# Patient Record
Sex: Male | Born: 1985
Health system: Southern US, Community
[De-identification: ages and names within clinical notes are randomized; demographics above are authoritative.]

## PROBLEM LIST (undated history)

## (undated) DIAGNOSIS — E119 Type 2 diabetes mellitus without complications: Secondary | ICD-10-CM

## (undated) DIAGNOSIS — I639 Cerebral infarction, unspecified: Secondary | ICD-10-CM

## (undated) DIAGNOSIS — E109 Type 1 diabetes mellitus without complications: Secondary | ICD-10-CM

## (undated) DIAGNOSIS — I10 Essential (primary) hypertension: Secondary | ICD-10-CM

## (undated) DIAGNOSIS — N186 End stage renal disease: Secondary | ICD-10-CM

## (undated) DIAGNOSIS — I5022 Chronic systolic (congestive) heart failure: Secondary | ICD-10-CM

## (undated) DIAGNOSIS — I69391 Dysphagia following cerebral infarction: Secondary | ICD-10-CM

## (undated) DIAGNOSIS — N183 Chronic kidney disease, stage 3 unspecified: Secondary | ICD-10-CM

## (undated) HISTORY — DX: Cerebral infarction, unspecified: I63.9

## (undated) HISTORY — DX: Chronic systolic (congestive) heart failure: I50.22

## (undated) HISTORY — DX: Type 2 diabetes mellitus without complications: E11.9

## (undated) HISTORY — DX: Dysphagia following cerebral infarction: I69.391

## (undated) HISTORY — PX: TONSILLECTOMY: SUR1361

## (undated) HISTORY — DX: Type 1 diabetes mellitus without complications: E10.9

## (undated) HISTORY — DX: Chronic kidney disease, stage 3 unspecified: N18.30

## (undated) HISTORY — PX: EYE SURGERY: SHX253

## (undated) HISTORY — DX: Essential (primary) hypertension: I10

---

## 2005-09-01 ENCOUNTER — Emergency Department: Payer: Self-pay | Admitting: Emergency Medicine

## 2005-09-10 ENCOUNTER — Ambulatory Visit: Payer: Self-pay | Admitting: Unknown Physician Specialty

## 2005-12-21 ENCOUNTER — Inpatient Hospital Stay: Payer: Self-pay | Admitting: Internal Medicine

## 2007-01-28 ENCOUNTER — Emergency Department: Payer: Self-pay | Admitting: Emergency Medicine

## 2007-05-12 ENCOUNTER — Ambulatory Visit: Payer: Self-pay | Admitting: Internal Medicine

## 2015-09-29 ENCOUNTER — Emergency Department
Admission: EM | Admit: 2015-09-29 | Discharge: 2015-09-29 | Disposition: A | Discharge status: Home / Self Care | Payer: Worker's Compensation | Attending: Emergency Medicine | Admitting: Emergency Medicine

## 2015-09-29 ENCOUNTER — Emergency Department: Payer: Worker's Compensation

## 2015-09-29 ENCOUNTER — Encounter: Payer: Self-pay | Admitting: Emergency Medicine

## 2015-09-29 DIAGNOSIS — W1839XA Other fall on same level, initial encounter: Secondary | ICD-10-CM | POA: Insufficient documentation

## 2015-09-29 DIAGNOSIS — Y929 Unspecified place or not applicable: Secondary | ICD-10-CM | POA: Insufficient documentation

## 2015-09-29 DIAGNOSIS — Y999 Unspecified external cause status: Secondary | ICD-10-CM | POA: Diagnosis not present

## 2015-09-29 DIAGNOSIS — Y9389 Activity, other specified: Secondary | ICD-10-CM | POA: Diagnosis not present

## 2015-09-29 DIAGNOSIS — S0990XA Unspecified injury of head, initial encounter: Secondary | ICD-10-CM | POA: Diagnosis present

## 2015-09-29 DIAGNOSIS — S0083XA Contusion of other part of head, initial encounter: Secondary | ICD-10-CM | POA: Diagnosis not present
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
## 2015-09-29 NOTE — Discharge Instructions (Signed)
Facial or Scalp Contusion ° A facial or scalp contusion is a deep bruise on the face or head. Contusions happen when an injury causes bleeding under the skin. Signs of bruising include pain, puffiness (swelling), and discolored skin. The contusion may turn blue, purple, or yellow. °HOME CARE °· Only take medicines as told by your doctor. °· Put ice on the injured area. °¨ Put ice in a plastic bag. °¨ Place a towel between your skin and the bag. °¨ Leave the ice on for 20 minutes, 2-3 times a day. °GET HELP IF: °· You have bite problems. °· You have pain when chewing. °· You are worried about your face not healing normally. °GET HELP RIGHT AWAY IF:  °· You have severe pain or a headache and medicine does not help. °· You are very tired or confused, or your personality changes. °· You throw up (vomit). °· You have a nosebleed that will not stop. °· You see two of everything (double vision) or have blurry vision. °· You have fluid coming from your nose or ear. °· You have problems walking or using your arms or legs. °MAKE SURE YOU:  °· Understand these instructions. °· Will watch your condition. °· Will get help right away if you are not doing well or get worse. °  °This information is not intended to replace advice given to you by your health care provider. Make sure you discuss any questions you have with your health care provider. °  °Document Released: 04/29/2011 Document Revised: 05/31/2014 Document Reviewed: 12/21/2012 °Elsevier Interactive Patient Education ©2016 Elsevier Inc. ° °

## 2018-04-16 DIAGNOSIS — D509 Iron deficiency anemia, unspecified: Secondary | ICD-10-CM

## 2018-04-16 HISTORY — DX: Iron deficiency anemia, unspecified: D50.9

## 2018-04-16 NOTE — Progress Notes (Signed)
Adrian Evans  [phone] [phone]  ID: Adrian Evans OB: 10-10-1985  MR#: [id number]  LDJ#:[id number]  Patient Care Team: Patient, No Pcp Per as PCP - General (General Practice)  CHIEF COMPLAINT: Iron deficiency anemia  INTERVAL HISTORY: Patient is a 32 year old male is noted to have declining hemoglobin and iron stores on recent laboratory work.  He was also noted to have 3+ hematuria on recent urinalysis.  He currently feels well and is asymptomatic.  He has no neurologic complaints.  He denies any recent fevers or illnesses.  He has a good appetite and denies weight loss.  He has no chest pain or shortness of breath.  He denies any nausea, vomiting, constipation, or diarrhea.  He has no melena or hematochezia.  Despite his documented hematuria, he has no urinary complaints.  Patient feels at his baseline offers no specific complaints today.  REVIEW OF SYSTEMS:   Review of Systems  Constitutional: Negative.  Negative for fever, malaise/fatigue and weight loss.  Respiratory: Negative.  Negative for cough, hemoptysis and shortness of breath.   Cardiovascular: Negative.  Negative for chest pain and leg swelling.  Gastrointestinal: Negative.  Negative for abdominal pain, blood in stool and melena.  Genitourinary: Positive for hematuria.  Musculoskeletal: Negative.  Negative for back pain.  Skin: Negative.  Negative for rash.  Neurological: Negative.  Negative for focal weakness, weakness and headaches.  Psychiatric/Behavioral: Negative.  The patient is not nervous/anxious.     As per HPI. Otherwise, a complete review of systems is negative.  PAST MEDICAL HISTORY: Past Medical History:  Diagnosis Date  . Diabetes mellitus without complication ([place])     PAST SURGICAL HISTORY: Past Surgical History:  Procedure Laterality Date  . EYE SURGERY    . TONSILLECTOMY      FAMILY HISTORY: No family history on file.  ADVANCED DIRECTIVES (Y/N):   N  HEALTH MAINTENANCE: Social History   Tobacco Use  . Smoking status: Never Smoker  Substance Use Topics  . Alcohol use: Not Currently  . Drug use: Not Currently     Colonoscopy:  PAP:  Bone density:  Lipid panel:  No Known Allergies  Current Outpatient Medications  Medication Sig Dispense Refill  . insulin aspart (NOVOLOG) 100 UNIT/ML injection Inject into the skin 3 (three) times daily before meals. Sliding scale     No current facility-administered medications for this visit.     OBJECTIVE: Vitals:   04/17/18 1529  BP: (!) 157/113  Pulse: (!) 108  Resp: 18  Temp: (!) 97.1 F (36.2 C)     There is no height or weight on file to calculate BMI.    ECOG FS:0 - Asymptomatic  General: Well-developed, well-nourished, no acute distress. Eyes: Pink conjunctiva, anicteric sclera. HEENT: Normocephalic, moist mucous membranes, clear oropharnyx. Lungs: Clear to auscultation bilaterally. Heart: Regular rate and rhythm. No rubs, murmurs, or gallops. Abdomen: Soft, nontender, nondistended. No organomegaly noted, normoactive bowel sounds. Musculoskeletal: No edema, cyanosis, or clubbing. Neuro: Alert, answering all questions appropriately. Cranial nerves grossly intact. Skin: No rashes or petechiae noted. Psych: Normal affect. Lymphatics: No cervical, calvicular, axillary or inguinal LAD.   LAB RESULTS:  No results found for: NA, K, CL, CO2, GLUCOSE, BUN, CREATININE, CALCIUM, PROT, ALBUMIN, AST, ALT, ALKPHOS, BILITOT, GFRNONAA, GFRAA  No results found for: WBC, NEUTROABS, HGB, HCT, MCV, PLT   STUDIES: No results found.  ASSESSMENT: Iron deficiency anemia  PLAN:   1. Iron deficiency anemia: Possibly secondary to documented hematuria which  by report is being worked up by urology.  His most recent hemoglobin is 8.7.  Total iron 29, iron saturation 13%.  B12, folate, SPEP were all within normal limits.  Patient denies having colonoscopy, and likely will require one in  the near future.  Will defer to primary care for GI referral.  Return to clinic in 1 to 2 weeks for Feraheme only.  Patient will then return to clinic in 3 months with repeat laboratory work, further evaluation, and additional Feraheme if necessary. 2.  Hematuria: Continue work-up per urology.  I spent a total of 45 minutes face-to-face with the patient of which greater than 50% of the visit was spent in counseling and coordination of care as detailed above.  Patient expressed understanding and was in agreement with this plan. He also understands that He can call clinic at any time with any questions, concerns, or complaints.   Cancer Staging No matching staging information was found for the patient.  [name], MD   04/23/2018 9:44 AM

## 2018-04-17 ENCOUNTER — Inpatient Hospital Stay: Payer: BC Managed Care – PPO | Attending: Oncology | Admitting: Oncology

## 2018-04-17 ENCOUNTER — Encounter: Payer: Self-pay | Admitting: Oncology

## 2018-04-17 ENCOUNTER — Other Ambulatory Visit: Payer: Self-pay

## 2018-04-17 DIAGNOSIS — D509 Iron deficiency anemia, unspecified: Secondary | ICD-10-CM | POA: Diagnosis present

## 2018-04-17 DIAGNOSIS — R319 Hematuria, unspecified: Secondary | ICD-10-CM | POA: Insufficient documentation

## 2018-04-17 NOTE — Progress Notes (Signed)
Patient here today for initial evaluation regarding anemia.  

## 2018-04-27 ENCOUNTER — Inpatient Hospital Stay: Payer: BC Managed Care – PPO | Attending: Oncology

## 2018-04-27 VITALS — BP 135/95 | HR 99 | Temp 96.9°F | Resp 18

## 2018-04-27 DIAGNOSIS — D509 Iron deficiency anemia, unspecified: Secondary | ICD-10-CM | POA: Diagnosis present

## 2018-04-27 MED ORDER — SODIUM CHLORIDE 0.9 % IV SOLN
Freq: Once | INTRAVENOUS | Status: AC
  Administered 2018-04-27: 15:00:00 via INTRAVENOUS
  Filled 2018-04-27: qty 250

## 2018-04-27 MED ORDER — SODIUM CHLORIDE 0.9 % IV SOLN
510.0000 mg | Freq: Once | INTRAVENOUS | Status: AC
  Administered 2018-04-27: 510 mg via INTRAVENOUS
  Filled 2018-04-27: qty 17

## 2018-05-04 ENCOUNTER — Inpatient Hospital Stay: Payer: BC Managed Care – PPO

## 2018-05-04 VITALS — BP 126/86 | HR 108 | Temp 96.0°F | Resp 20

## 2018-05-04 DIAGNOSIS — D509 Iron deficiency anemia, unspecified: Secondary | ICD-10-CM

## 2018-05-04 MED ORDER — SODIUM CHLORIDE 0.9 % IV SOLN
Freq: Once | INTRAVENOUS | Status: AC
  Administered 2018-05-04: 14:00:00 via INTRAVENOUS
  Filled 2018-05-04: qty 250

## 2018-05-04 MED ORDER — SODIUM CHLORIDE 0.9 % IV SOLN
510.0000 mg | Freq: Once | INTRAVENOUS | Status: AC
  Administered 2018-05-04: 510 mg via INTRAVENOUS
  Filled 2018-05-04: qty 17

## 2018-05-10 ENCOUNTER — Telehealth: Payer: Self-pay

## 2018-05-10 ENCOUNTER — Emergency Department: Payer: BC Managed Care – PPO

## 2018-05-10 ENCOUNTER — Other Ambulatory Visit: Payer: Self-pay

## 2018-05-10 ENCOUNTER — Encounter: Payer: Self-pay | Admitting: Emergency Medicine

## 2018-05-10 ENCOUNTER — Emergency Department
Admission: EM | Admit: 2018-05-10 | Discharge: 2018-05-10 | Disposition: A | Discharge status: Home / Self Care | Payer: BC Managed Care – PPO | Attending: Emergency Medicine | Admitting: Emergency Medicine

## 2018-05-10 DIAGNOSIS — Y92481 Parking lot as the place of occurrence of the external cause: Secondary | ICD-10-CM | POA: Insufficient documentation

## 2018-05-10 DIAGNOSIS — Y998 Other external cause status: Secondary | ICD-10-CM | POA: Diagnosis not present

## 2018-05-10 DIAGNOSIS — Z794 Long term (current) use of insulin: Secondary | ICD-10-CM | POA: Insufficient documentation

## 2018-05-10 DIAGNOSIS — S060X1A Concussion with loss of consciousness of 30 minutes or less, initial encounter: Secondary | ICD-10-CM | POA: Diagnosis not present

## 2018-05-10 DIAGNOSIS — E119 Type 2 diabetes mellitus without complications: Secondary | ICD-10-CM | POA: Diagnosis not present

## 2018-05-10 DIAGNOSIS — Y9389 Activity, other specified: Secondary | ICD-10-CM | POA: Diagnosis not present

## 2018-05-10 DIAGNOSIS — S50812A Abrasion of left forearm, initial encounter: Secondary | ICD-10-CM | POA: Diagnosis not present

## 2018-05-10 DIAGNOSIS — W2210XA Striking against or struck by unspecified automobile airbag, initial encounter: Secondary | ICD-10-CM | POA: Diagnosis not present

## 2018-05-10 DIAGNOSIS — T148XXA Other injury of unspecified body region, initial encounter: Secondary | ICD-10-CM

## 2018-05-10 DIAGNOSIS — S0990XA Unspecified injury of head, initial encounter: Secondary | ICD-10-CM | POA: Diagnosis present

## 2018-05-10 DIAGNOSIS — M79632 Pain in left forearm: Secondary | ICD-10-CM
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
## 2018-05-10 NOTE — Discharge Instructions (Addendum)
Please seek medical attention for any high fevers, chest pain, shortness of breath, change in behavior, persistent vomiting, bloody stool or any other new or concerning symptoms.  

## 2018-05-10 NOTE — ED Notes (Signed)
Patient verbalized understanding of discharge instructions, no questions. Patient out of ED via wheelchair in no distress.  

## 2018-05-10 NOTE — Telephone Encounter (Signed)
Dr. Ronnald Collum faxed used a critical lab (Ferritin) to notify Dr. Grayland Ormond. Dr. Grayland Ormond was notified.
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
## 2018-05-11 NOTE — Telephone Encounter (Signed)
Dr. Ronnald Collum sent a critical lab (Ferritin) to Dr. Grayland Ormond. Dr. Grayland Ormond wants to recheck patient's lab to make sure everything is okay. Our scheduler will contact patient with lab appointment information.

## 2018-05-12 NOTE — Telephone Encounter (Signed)
Patient is scheduled to come in on 05/22/2018 for labs.

## 2018-05-22 ENCOUNTER — Other Ambulatory Visit: Payer: Self-pay

## 2018-05-22 ENCOUNTER — Inpatient Hospital Stay
Admission: EM | Admit: 2018-05-22 | Discharge: 2018-05-27 | DRG: 286 | Disposition: A | Discharge status: Home / Self Care | Payer: BC Managed Care – PPO | Attending: Specialist | Admitting: Specialist

## 2018-05-22 ENCOUNTER — Emergency Department: Payer: BC Managed Care – PPO

## 2018-05-22 ENCOUNTER — Encounter: Payer: Self-pay | Admitting: Intensive Care

## 2018-05-22 ENCOUNTER — Inpatient Hospital Stay: Payer: BC Managed Care – PPO

## 2018-05-22 DIAGNOSIS — I509 Heart failure, unspecified: Secondary | ICD-10-CM

## 2018-05-22 DIAGNOSIS — T502X5A Adverse effect of carbonic-anhydrase inhibitors, benzothiadiazides and other diuretics, initial encounter: Secondary | ICD-10-CM | POA: Diagnosis present

## 2018-05-22 DIAGNOSIS — N183 Chronic kidney disease, stage 3 (moderate): Secondary | ICD-10-CM | POA: Diagnosis present

## 2018-05-22 DIAGNOSIS — I5021 Acute systolic (congestive) heart failure: Secondary | ICD-10-CM | POA: Diagnosis present

## 2018-05-22 DIAGNOSIS — I081 Rheumatic disorders of both mitral and tricuspid valves: Secondary | ICD-10-CM | POA: Diagnosis present

## 2018-05-22 DIAGNOSIS — N179 Acute kidney failure, unspecified: Secondary | ICD-10-CM | POA: Diagnosis present

## 2018-05-22 DIAGNOSIS — D509 Iron deficiency anemia, unspecified: Secondary | ICD-10-CM

## 2018-05-22 DIAGNOSIS — E876 Hypokalemia: Secondary | ICD-10-CM | POA: Diagnosis present

## 2018-05-22 DIAGNOSIS — I272 Pulmonary hypertension, unspecified: Secondary | ICD-10-CM | POA: Diagnosis present

## 2018-05-22 DIAGNOSIS — I130 Hypertensive heart and chronic kidney disease with heart failure and stage 1 through stage 4 chronic kidney disease, or unspecified chronic kidney disease: Principal | ICD-10-CM | POA: Diagnosis present

## 2018-05-22 DIAGNOSIS — I420 Dilated cardiomyopathy: Secondary | ICD-10-CM | POA: Diagnosis present

## 2018-05-22 DIAGNOSIS — E1022 Type 1 diabetes mellitus with diabetic chronic kidney disease: Secondary | ICD-10-CM | POA: Diagnosis present

## 2018-05-22 DIAGNOSIS — Z23 Encounter for immunization: Secondary | ICD-10-CM

## 2018-05-22 DIAGNOSIS — R609 Edema, unspecified: Secondary | ICD-10-CM

## 2018-05-22 DIAGNOSIS — E1122 Type 2 diabetes mellitus with diabetic chronic kidney disease: Secondary | ICD-10-CM

## 2018-05-22 HISTORY — DX: Heart failure, unspecified: I50.9

## 2018-05-22 LAB — CBC WITH DIFFERENTIAL/PLATELET
Abs Immature Granulocytes: 0.01 10*3/uL (ref 0.00–0.07)
Abs Immature Granulocytes: 0.02 10*3/uL (ref 0.00–0.07)
BASOS PCT: 1 %
BASOS PCT: 1 %
Basophils Absolute: 0.1 10*3/uL (ref 0.0–0.1)
Basophils Absolute: 0.1 10*3/uL (ref 0.0–0.1)
EOS PCT: 2 %
Eosinophils Absolute: 0.1 10*3/uL (ref 0.0–0.5)
Eosinophils Absolute: 0.1 10*3/uL (ref 0.0–0.5)
Eosinophils Relative: 2 %
HCT: 39.7 % (ref 39.0–52.0)
HCT: 41.3 % (ref 39.0–52.0)
Hemoglobin: 12.6 g/dL — ABNORMAL LOW (ref 13.0–17.0)
Hemoglobin: 13.1 g/dL (ref 13.0–17.0)
Immature Granulocytes: 0 %
Immature Granulocytes: 0 %
Lymphocytes Relative: 22 %
Lymphocytes Relative: 21 %
Lymphs Abs: 1.3 10*3/uL (ref 0.7–4.0)
Lymphs Abs: 1.4 10*3/uL (ref 0.7–4.0)
MCH: 29.2 pg (ref 26.0–34.0)
MCH: 29.4 pg (ref 26.0–34.0)
MCHC: 31.7 g/dL (ref 30.0–36.0)
MCHC: 31.7 g/dL (ref 30.0–36.0)
MCV: 92.1 fL (ref 80.0–100.0)
MCV: 92.6 fL (ref 80.0–100.0)
MONO ABS: 0.4 10*3/uL (ref 0.1–1.0)
Monocytes Absolute: 0.6 10*3/uL (ref 0.1–1.0)
Monocytes Relative: 7 %
Monocytes Relative: 9 %
NRBC: 0 % (ref 0.0–0.2)
Neutro Abs: 4.1 10*3/uL (ref 1.7–7.7)
Neutro Abs: 4.7 10*3/uL (ref 1.7–7.7)
Neutrophils Relative %: 68 %
Neutrophils Relative %: 67 %
PLATELETS: 233 10*3/uL (ref 150–400)
Platelets: 215 10*3/uL (ref 150–400)
RBC: 4.31 MIL/uL (ref 4.22–5.81)
RBC: 4.46 MIL/uL (ref 4.22–5.81)
RDW: 15.3 % (ref 11.5–15.5)
RDW: 15.2 % (ref 11.5–15.5)
WBC: 6 10*3/uL (ref 4.0–10.5)
WBC: 6.9 10*3/uL (ref 4.0–10.5)
nRBC: 0 % (ref 0.0–0.2)

## 2018-05-22 LAB — COMPREHENSIVE METABOLIC PANEL
ALT: 25 U/L (ref 0–44)
AST: 25 U/L (ref 15–41)
Albumin: 3.7 g/dL (ref 3.5–5.0)
Alkaline Phosphatase: 57 U/L (ref 38–126)
Anion gap: 10 (ref 5–15)
BUN: 37 mg/dL — ABNORMAL HIGH (ref 6–20)
CHLORIDE: 109 mmol/L (ref 98–111)
CO2: 23 mmol/L (ref 22–32)
Calcium: 9 mg/dL (ref 8.9–10.3)
Creatinine, Ser: 2.56 mg/dL — ABNORMAL HIGH (ref 0.61–1.24)
GFR calc Af Amer: 36 mL/min — ABNORMAL LOW (ref >60)
GFR calc non Af Amer: 31 mL/min — ABNORMAL LOW (ref >60)
Glucose, Bld: 93 mg/dL (ref 70–99)
Potassium: 3.2 mmol/L — ABNORMAL LOW (ref 3.5–5.1)
Sodium: 142 mmol/L (ref 135–145)
Total Bilirubin: 2.1 mg/dL — ABNORMAL HIGH (ref 0.3–1.2)
Total Protein: 6.8 g/dL (ref 6.5–8.1)

## 2018-05-22 LAB — FERRITIN: Ferritin: 405 ng/mL — ABNORMAL HIGH (ref 24–336)

## 2018-05-22 LAB — BRAIN NATRIURETIC PEPTIDE: B Natriuretic Peptide: 3265 pg/mL — ABNORMAL HIGH (ref 0.0–100.0)

## 2018-05-22 LAB — URINALYSIS, COMPLETE (UACMP) WITH MICROSCOPIC
Bacteria, UA: NONE SEEN
Bilirubin Urine: NEGATIVE
Glucose, UA: NEGATIVE mg/dL
Ketones, ur: NEGATIVE mg/dL
Leukocytes, UA: NEGATIVE
Nitrite: NEGATIVE
PROTEIN: 100 mg/dL — AB
Specific Gravity, Urine: 1.006 (ref 1.005–1.030)
Squamous Epithelial / HPF: NONE SEEN (ref 0–5)
pH: 5 (ref 5.0–8.0)

## 2018-05-22 LAB — IRON AND TIBC
Iron: 53 ug/dL (ref 45–182)
Saturation Ratios: 18 % (ref 17.9–39.5)
TIBC: 289 ug/dL (ref 250–450)
UIBC: 236 ug/dL

## 2018-05-22 LAB — GLUCOSE, CAPILLARY: Glucose-Capillary: 82 mg/dL (ref 70–99)

## 2018-05-22 MED ORDER — FUROSEMIDE 10 MG/ML IJ SOLN
20.0000 mg | Freq: Once | INTRAMUSCULAR | Status: AC
  Administered 2018-05-22: 20 mg via INTRAVENOUS
  Filled 2018-05-22: qty 4

## 2018-05-22 MED ORDER — INSULIN ASPART 100 UNIT/ML ~~LOC~~ SOLN
0.0000 [IU] | Freq: Three times a day (TID) | SUBCUTANEOUS | Status: DC
  Administered 2018-05-23 – 2018-05-26 (×4): 1 [IU] via SUBCUTANEOUS
  Filled 2018-05-22 (×4): qty 1

## 2018-05-22 MED ORDER — POTASSIUM CHLORIDE CRYS ER 20 MEQ PO TBCR
40.0000 meq | EXTENDED_RELEASE_TABLET | Freq: Once | ORAL | Status: AC
  Administered 2018-05-22: 40 meq via ORAL
  Filled 2018-05-22: qty 2

## 2018-05-22 MED ORDER — SODIUM CHLORIDE 0.9 % IV SOLN
Freq: Once | INTRAVENOUS | Status: AC
  Administered 2018-05-22: 18:00:00 via INTRAVENOUS

## 2018-05-22 NOTE — ED Triage Notes (Signed)
Patient c/o swelling from abdomen and down. Was placed on fluid pill X1 week ago but reports no change. Patient is here today due to getting worse and fluid pill not helping. His next appointment is not until next month. Ambulatory into triage with no problems

## 2018-05-22 NOTE — ED Provider Notes (Signed)
[facility] Emergency Department Provider Note  ____________________________________________   I have reviewed the triage vital signs and the nursing notes.   HISTORY  Chief Complaint Leg Swelling   History limited by: Not Limited   HPI Adrian Evans is a 32 y.o. male who presents to the emergency department today because of concerns for bilateral leg swelling.  He states the swelling started a few weeks ago.  Located in both legs.  He has seen his primary care doctor for this and was started on Lasix 20 mg 1 week ago.  He does not feel like this is helped.  In addition he was recently found to be anemic and has been following up with hematology and has received iron transfusions.  The patient is not aware of any history of kidney dysfunction.  Per medical record review patient has a history of DM  Past Medical History:  Diagnosis Date  . Diabetes mellitus without complication [facility])     Patient Active Problem List   Diagnosis Date Noted  . Iron deficiency anemia 04/16/2018    Past Surgical History:  Procedure Laterality Date  . EYE SURGERY    . TONSILLECTOMY      Prior to Admission medications   Medication Sig Start Date End Date Taking? Authorizing Provider  insulin aspart (NOVOLOG) 100 UNIT/ML injection Inject into the skin 3 (three) times daily before meals. Sliding scale    [provider]    Allergies Patient has no known allergies.  History reviewed. No pertinent family history.  Social History Social History   Tobacco Use  . Smoking status: Never Smoker  . Smokeless tobacco: Never Used  Substance Use Topics  . Alcohol use: Not Currently  . Drug use: Not Currently    Review of Systems Constitutional: No fever/chills Eyes: No visual changes. ENT: No sore throat. Cardiovascular: Denies chest pain. Respiratory: Denies shortness of breath. Gastrointestinal: No abdominal pain.  No nausea, no vomiting.  No diarrhea.    Genitourinary: Negative for dysuria. Musculoskeletal: Positive for bilateral leg swelling Skin: Negative for rash. Neurological: Negative for headaches, focal weakness or numbness.  ____________________________________________   PHYSICAL EXAM:  VITAL SIGNS: ED Triage Vitals  Enc Vitals Group     BP 05/22/18 1402 (!) 137/103     Pulse Rate 05/22/18 1402 (!) 104     Resp 05/22/18 1402 18     Temp 05/22/18 1402 97.9 F (36.6 C)     Temp Source 05/22/18 1402 Oral     SpO2 05/22/18 1402 93 %     Weight 05/22/18 1403 280 lb (127 kg)     Height 05/22/18 1403 6' (1.829 m)     Head Circumference --      Peak Flow --      Pain Score 05/22/18 1403 9   Constitutional: Alert and oriented.  Eyes: Conjunctivae are normal.  ENT      Head: Normocephalic and atraumatic.      Nose: No congestion/rhinnorhea.      Mouth/Throat: Mucous membranes are moist.      Neck: No stridor. Hematological/Lymphatic/Immunilogical: No cervical lymphadenopathy. Cardiovascular: Normal rate, regular rhythm.  No murmurs, rubs, or gallops. Respiratory: Normal respiratory effort without tachypnea nor retractions. Breath sounds are clear and equal bilaterally. No wheezes/rales/rhonchi. Gastrointestinal: Soft and non tender. No rebound. No guarding.  Genitourinary: Deferred Musculoskeletal: Normal range of motion in all extremities.  Bilateral 2+ pitting edema in the lower extremities Neurologic:  Normal speech and language. No gross  focal neurologic deficits are appreciated.  Skin:  Skin is warm, dry and intact. No rash noted. Psychiatric: Mood and affect are normal. Speech and behavior are normal. Patient exhibits appropriate insight and judgment.  ____________________________________________    LABS (pertinent positives/negatives)  CBC wbc 6.9, hgb 13.1, plt 233 CMP na 142, k 3.2, cr 2.56, t bili 2.1  ____________________________________________   EKG  I, Nance Pear, attending physician,  personally viewed and interpreted this EKG  EKG Time: 1746 Rate: 97 Rhythm: sinus rhythm Axis: normal Intervals: qtc 439 QRS: narrow, q waves v1, low voltage ST changes: no st elevation Impression: abnormal ekg   ____________________________________________    RADIOLOGY  CXR Concern for chf  ____________________________________________   PROCEDURES  Procedures  ____________________________________________   INITIAL IMPRESSION / ASSESSMENT AND PLAN / ED COURSE  Pertinent labs & imaging results that were available during my care of the patient were reviewed by me and considered in my medical decision making (see chart for details).   Patient presented to the emergency department today because of concerns for worsening swelling in his bilateral lower extremities.  Patient has been on a fluid pill for the past week without any significant relief.  Blood work is concerning for elevated kidney function.  Patient is unaware of any history of kidney disorder.  Discussed these findings with the patient.  Chest x-ray concerning for possible CHF.  Given findings will plan on admission.   ____________________________________________   FINAL CLINICAL IMPRESSION(S) / ED DIAGNOSES  Final diagnoses:  AKI (acute kidney injury) ([place])  Peripheral edema     Note: This dictation was prepared with Dragon dictation. Any transcriptional errors that result from this process are unintentional     Nance Pear, MD 05/22/18 [phone]

## 2018-05-22 NOTE — H&P (Signed)
[place] [facility] [place] NAME: Adrian Evans    MR#:  [id number]  DATE OF BIRTH:  03/10/1986  DATE OF ADMISSION:  05/22/2018  PRIMARY CARE PHYSICIAN: Lenard Simmer, MD   REQUESTING/REFERRING PHYSICIAN: Nance Pear,   CHIEF COMPLAINT:   Chief Complaint  Patient presents with  . Leg Swelling    HISTORY OF PRESENT ILLNESS: Adrian Evans  is a 31 y.o. male with a known history of diabetes type 2 who is presenting to the emergency room with complaint of swelling of the lower extremity.  Patient states that the swelling started 3 weeks ago and has progressively gotten worse.  Patient also states that he is getting short of breath and has gained significant amount of weight.  He also has a dry cough.  In the emergency room patient is noted to have elevated creatinine.  No baseline creatinines available.  Chest x-ray also suggestive of pneumonia.  PAST MEDICAL HISTORY:   Past Medical History:  Diagnosis Date  . Diabetes mellitus without complication ([place])     PAST SURGICAL HISTORY:  Past Surgical History:  Procedure Laterality Date  . EYE SURGERY    . TONSILLECTOMY      SOCIAL HISTORY:  Social History   Tobacco Use  . Smoking status: Never Smoker  . Smokeless tobacco: Never Used  Substance Use Topics  . Alcohol use: Not Currently    FAMILY HISTORY: History reviewed. No pertinent family history.  DRUG ALLERGIES: No Known Allergies  REVIEW OF SYSTEMS:   CONSTITUTIONAL: No fever, fatigue or weakness.  Positive weight gain EYES: No blurred or double vision.  EARS, NOSE, AND THROAT: No tinnitus or ear pain.  RESPIRATORY: Positive cough, positive shortness of breath, wheezing or hemoptysis.  CARDIOVASCULAR: No chest pain, orthopnea, 2+ edema.  GASTROINTESTINAL: No nausea, vomiting, diarrhea or abdominal pain.  GENITOURINARY: No dysuria, hematuria.  ENDOCRINE: No polyuria, nocturia,  HEMATOLOGY: No anemia, easy bruising or  bleeding SKIN: No rash or lesion. MUSCULOSKELETAL: No joint pain or arthritis.   NEUROLOGIC: No tingling, numbness, weakness.  PSYCHIATRY: No anxiety or depression.   MEDICATIONS AT HOME:  Prior to Admission medications   Medication Sig Start Date End Date Taking? Authorizing Provider  insulin aspart (NOVOLOG) 100 UNIT/ML injection Inject into the skin 3 (three) times daily before meals. Sliding scale    [provider]      PHYSICAL EXAMINATION:   VITAL SIGNS: Blood pressure (!) 146/112, pulse (!) 102, temperature 97.9 F (36.6 C), temperature source Oral, resp. rate 18, height 6' (1.829 m), weight 127 kg, SpO2 99 %.  GENERAL:  32 y.o.-year-old patient lying in the bed with no acute distress.  EYES: Pupils equal, round, reactive to light and accommodation. No scleral icterus. Extraocular muscles intact.  HEENT: Head atraumatic, normocephalic. Oropharynx and nasopharynx clear.  NECK:  Supple, no jugular venous distention. No thyroid enlargement, no tenderness.  LUNGS: Occasional crackles at the base CARDIOVASCULAR: S1, S2 normal. No murmurs, rubs, or gallops.  ABDOMEN: Soft, nontender, nondistended. Bowel sounds present. No organomegaly or mass.  EXTREMITIES: 2+ pedal edema, cyanosis, or clubbing.  NEUROLOGIC: Cranial nerves II through XII are intact. Muscle strength 5/5 in all extremities. Sensation intact. Gait not checked.  PSYCHIATRIC: The patient is alert and oriented x 3.  SKIN: No obvious rash, lesion, or ulcer.   LABORATORY PANEL:   CBC Recent Labs  Lab 05/22/18 1147 05/22/18 1409  WBC 6.0 6.9  HGB 12.6* 13.1  HCT 39.7  41.3  PLT 215 233  MCV 92.1 92.6  MCH 29.2 29.4  MCHC 31.7 31.7  RDW 15.3 15.2  LYMPHSABS 1.3 1.4  MONOABS 0.4 0.6  EOSABS 0.1 0.1  BASOSABS 0.1 0.1   ------------------------------------------------------------------------------------------------------------------  Chemistries  Recent Labs  Lab 05/22/18 1409  NA 142  K 3.2*   CL 109  CO2 23  GLUCOSE 93  BUN 37*  CREATININE 2.56*  CALCIUM 9.0  AST 25  ALT 25  ALKPHOS 57  BILITOT 2.1*   ------------------------------------------------------------------------------------------------------------------ estimated creatinine clearance is 56 mL/min (A) (by C-G formula based on SCr of 2.56 mg/dL (H)). ------------------------------------------------------------------------------------------------------------------ No results for input(s): TSH, T4TOTAL, T3FREE, THYROIDAB in the last 72 hours.  Invalid input(s): FREET3   Coagulation profile No results for input(s): INR, PROTIME in the last 168 hours. ------------------------------------------------------------------------------------------------------------------- No results for input(s): DDIMER in the last 72 hours. -------------------------------------------------------------------------------------------------------------------  Cardiac Enzymes No results for input(s): CKMB, TROPONINI, MYOGLOBIN in the last 168 hours.  Invalid input(s): CK ------------------------------------------------------------------------------------------------------------------ Invalid input(s): POCBNP  ---------------------------------------------------------------------------------------------------------------  Urinalysis No results found for: COLORURINE, APPEARANCEUR, LABSPEC, PHURINE, GLUCOSEU, HGBUR, BILIRUBINUR, KETONESUR, PROTEINUR, UROBILINOGEN, NITRITE, LEUKOCYTESUR   RADIOLOGY: Dg Chest 2 View  Result Date: 05/22/2018 CLINICAL DATA:  Cough and peripheral edema for the past 2 weeks. History of diabetes. EXAM: CHEST - 2 VIEW COMPARISON:  None. FINDINGS: The lungs are adequately inflated. The cardiac silhouette is enlarged. The pulmonary vascularity is mildly engorged. The interstitial markings are mildly increased. There is no significant pleural effusion. The observed bony thorax is unremarkable. IMPRESSION: Findings  worrisome for mild CHF which would be unusual in this age group unless there is a history of cardiac disease. The enlargement of the cardiac silhouette may reflect chamber dilation, muscular hypertrophy, or pericardial effusion in the appropriate clinical setting. Electronically Signed   By: David  Martinique M.D.   On: 05/22/2018 14:37    EKG: Orders placed or performed during the hospital encounter of 05/22/18  . ED EKG  . ED EKG    IMPRESSION AND PLAN: Patient is a 32 year old African-American male presenting with shortness of breath and lower extremity swelling  1.  Acute CHF type unknown We will treat with IV Lasix Obtain echocardiogram of the heart Start patient on Coreg Due to acute renal failure will not use ACE inhibitor  2.  Acute renal failure suspect due to fluid overload We will give IV Lasix Nephrology consult Renal ultrasound Obtain a urinalysis  3.  Diabetes type 2 check hemoglobin A1c Placed on sliding scale insulin Patient only uses NovoLog sliding scale at home  All the records are reviewed and case discussed with ED provider. Management plans discussed with the patient, family and they are in agreement.  CODE STATUS: Full code    TOTAL TIME TAKING CARE OF THIS PATIENT: 55 minutes.    Dustin Flock M.D on 05/22/2018 at 6:06 PM  Between 7am to 6pm - Pager - [phone]  After 6pm go to [email or web address] - password [email or web address]  [facility] Office  [phone]  CC: Primary care physician; Lenard Simmer, MD

## 2018-05-23 ENCOUNTER — Inpatient Hospital Stay: Payer: BC Managed Care – PPO

## 2018-05-23 ENCOUNTER — Inpatient Hospital Stay
Admit: 2018-05-23 | Discharge: 2018-05-23 | Disposition: A | Discharge status: Home / Self Care | Payer: BC Managed Care – PPO | Attending: Internal Medicine | Admitting: Internal Medicine

## 2018-05-23 LAB — BASIC METABOLIC PANEL
Anion gap: 11 (ref 5–15)
BUN: 36 mg/dL — ABNORMAL HIGH (ref 6–20)
CO2: 21 mmol/L — ABNORMAL LOW (ref 22–32)
Calcium: 8.8 mg/dL — ABNORMAL LOW (ref 8.9–10.3)
Chloride: 110 mmol/L (ref 98–111)
Creatinine, Ser: 2.43 mg/dL — ABNORMAL HIGH (ref 0.61–1.24)
GFR calc Af Amer: 39 mL/min — ABNORMAL LOW (ref >60)
GFR calc non Af Amer: 33 mL/min — ABNORMAL LOW (ref >60)
Glucose, Bld: 108 mg/dL — ABNORMAL HIGH (ref 70–99)
Potassium: 3.4 mmol/L — ABNORMAL LOW (ref 3.5–5.1)
Sodium: 142 mmol/L (ref 135–145)

## 2018-05-23 LAB — CBC
HCT: 38.8 % — ABNORMAL LOW (ref 39.0–52.0)
Hemoglobin: 12.3 g/dL — ABNORMAL LOW (ref 13.0–17.0)
MCH: 29.6 pg (ref 26.0–34.0)
MCHC: 31.7 g/dL (ref 30.0–36.0)
MCV: 93.3 fL (ref 80.0–100.0)
Platelets: 221 K/uL (ref 150–400)
RBC: 4.16 MIL/uL — ABNORMAL LOW (ref 4.22–5.81)
RDW: 15.2 % (ref 11.5–15.5)
WBC: 8.2 K/uL (ref 4.0–10.5)
nRBC: 0 % (ref 0.0–0.2)

## 2018-05-23 LAB — GLUCOSE, CAPILLARY
GLUCOSE-CAPILLARY: 98 mg/dL (ref 70–99)
Glucose-Capillary: 87 mg/dL (ref 70–99)
Glucose-Capillary: 93 mg/dL (ref 70–99)
Glucose-Capillary: 141 mg/dL — ABNORMAL HIGH (ref 70–99)
Glucose-Capillary: 116 mg/dL — ABNORMAL HIGH (ref 70–99)

## 2018-05-23 LAB — TSH: TSH: 4.061 u[IU]/mL (ref 0.350–4.500)

## 2018-05-23 LAB — PROTEIN / CREATININE RATIO, URINE
Creatinine, Urine: 49 mg/dL
PROTEIN CREATININE RATIO: 0.47 mg/mg{creat} — AB (ref 0.00–0.15)
Total Protein, Urine: 23 mg/dL

## 2018-05-23 LAB — HEMOGLOBIN A1C
Hgb A1c MFr Bld: 5.9 % — ABNORMAL HIGH (ref 4.8–5.6)
Mean Plasma Glucose: 122.63 mg/dL

## 2018-05-23 MED ORDER — LABETALOL HCL 5 MG/ML IV SOLN
10.0000 mg | INTRAVENOUS | Status: DC | PRN
  Administered 2018-05-23: 10 mg via INTRAVENOUS
  Filled 2018-05-23: qty 4

## 2018-05-23 MED ORDER — HEPARIN SODIUM (PORCINE) 5000 UNIT/ML IJ SOLN
5000.0000 [IU] | Freq: Three times a day (TID) | INTRAMUSCULAR | Status: DC
  Administered 2018-05-23 – 2018-05-27 (×13): 5000 [IU] via SUBCUTANEOUS
  Filled 2018-05-23 (×13): qty 1

## 2018-05-23 MED ORDER — SODIUM CHLORIDE 0.9% FLUSH
3.0000 mL | Freq: Two times a day (BID) | INTRAVENOUS | Status: DC
  Administered 2018-05-23 – 2018-05-27 (×7): 3 mL via INTRAVENOUS

## 2018-05-23 MED ORDER — ONDANSETRON HCL 4 MG/2ML IJ SOLN: 4.0000 mg | Freq: Four times a day (QID) | INTRAMUSCULAR | Status: DC | PRN

## 2018-05-23 MED ORDER — SODIUM CHLORIDE 0.9 % IV SOLN: 250.0000 mL | INTRAVENOUS | Status: DC | PRN

## 2018-05-23 MED ORDER — PNEUMOCOCCAL VAC POLYVALENT 25 MCG/0.5ML IJ INJ
0.5000 mL | INJECTION | INTRAMUSCULAR | Status: AC
  Administered 2018-05-24: 0.5 mL via INTRAMUSCULAR
  Filled 2018-05-23: qty 0.5

## 2018-05-23 MED ORDER — FUROSEMIDE 10 MG/ML IJ SOLN
40.0000 mg | Freq: Two times a day (BID) | INTRAMUSCULAR | Status: DC
  Administered 2018-05-23 – 2018-05-24 (×3): 40 mg via INTRAVENOUS
  Filled 2018-05-23 (×3): qty 4

## 2018-05-23 MED ORDER — PREMIER PROTEIN SHAKE
11.0000 [oz_av] | Freq: Two times a day (BID) | ORAL | Status: DC
  Administered 2018-05-23 – 2018-05-27 (×6): 11 [oz_av] via ORAL

## 2018-05-23 MED ORDER — POTASSIUM CHLORIDE CRYS ER 20 MEQ PO TBCR
40.0000 meq | EXTENDED_RELEASE_TABLET | Freq: Once | ORAL | Status: AC
  Administered 2018-05-23: 40 meq via ORAL
  Filled 2018-05-23: qty 2

## 2018-05-23 MED ORDER — ACETAMINOPHEN 325 MG PO TABS: 650.0000 mg | ORAL_TABLET | ORAL | Status: DC | PRN

## 2018-05-23 MED ORDER — CARVEDILOL 6.25 MG PO TABS
6.2500 mg | ORAL_TABLET | Freq: Two times a day (BID) | ORAL | Status: DC
  Administered 2018-05-23 – 2018-05-25 (×5): 6.25 mg via ORAL
  Filled 2018-05-23 (×5): qty 1

## 2018-05-23 MED ORDER — ADULT MULTIVITAMIN W/MINERALS CH
1.0000 | ORAL_TABLET | Freq: Every day | ORAL | Status: DC
  Administered 2018-05-23 – 2018-05-27 (×4): 1 via ORAL
  Filled 2018-05-23 (×4): qty 1

## 2018-05-23 MED ORDER — SODIUM CHLORIDE 0.9% FLUSH: 3.0000 mL | INTRAVENOUS | Status: DC | PRN

## 2018-05-23 NOTE — ED Notes (Signed)
Called floor to give report on pt. RN requests to call this RN back.

## 2018-05-23 NOTE — Plan of Care (Signed)
  Problem: Education: Goal: Ability to verbalize understanding of medication therapies will improve Outcome: Progressing   Problem: Activity: Goal: Capacity to carry out activities will improve Outcome: Progressing   Problem: Cardiac: Goal: Ability to achieve and maintain adequate cardiopulmonary perfusion will improve Outcome: Progressing   Problem: Clinical Measurements: Goal: Will remain free from infection Outcome: Progressing Goal: Diagnostic test results will improve Outcome: Progressing Goal: Respiratory complications will improve Outcome: Progressing   Problem: Elimination: Goal: Will not experience complications related to urinary retention Outcome: Progressing   Problem: Skin Integrity: Goal: Risk for impaired skin integrity will decrease Outcome: Progressing

## 2018-05-23 NOTE — Consult Note (Signed)
[facility]  CONSULT NOTE    Date: 05/23/2018                  Patient Name:  Adrian Evans  MRN: [id number]  DOB: [birth year]  Age / Sex: 31 y.o., male         PCP: Lenard Simmer, MD                 Service Requesting Consult: Dr. Posey Pronto                 Reason for Consult: Acute renal failure            History of Present Illness: Adrian Evans is a 32 y.o. black male with diabetes mellitus type I, diabetic retinopathy, who was admitted to [facility] on 05/22/2018 for Peripheral edema [R60.9] Acute renal failure (ARF) ([place]) [N17.9] AKI (acute kidney injury) ([place]) [N17.9]  Patient's wife is at bedside who assists with history taking. Patient states that he has been having peripheral edema for more than 3 weeks. Denies any recent changes to his medications. No other changes to his health. Patient does endorse occasional use of ibuprofen.   Patient has been diagnosed with diabetes for more than 11 years. Has been on insulin the entire time. States his glucose is well controlled.   Medications: Outpatient medications: Medications Prior to Admission  Medication Sig Dispense Refill Last Dose  . insulin aspart (NOVOLOG) 100 UNIT/ML injection Inject into the skin 3 (three) times daily before meals. Sliding scale   As directed at As directed  . insulin degludec (TRESIBA FLEXTOUCH) 100 UNIT/ML SOPN FlexTouch Pen Inject 10 Units into the skin at bedtime.   05/21/2018 at 2000    Current medications: Current Facility-Administered Medications  Medication Dose Route Frequency Provider Last Rate Last Dose  . 0.9 %  sodium chloride infusion  250 mL Intravenous PRN Dustin Flock, MD      . acetaminophen (TYLENOL) tablet 650 mg  650 mg Oral Q4H PRN Dustin Flock, MD      . carvedilol (COREG) tablet 6.25 mg  6.25 mg Oral BID WC Dustin Flock, MD   6.25 mg at 05/23/18 0853  . furosemide (LASIX) injection 40 mg  40 mg Intravenous BID Dustin Flock, MD   40 mg  at 05/23/18 0852  . heparin injection 5,000 Units  5,000 Units Subcutaneous Q8H Dustin Flock, MD   5,000 Units at 05/23/18 [phone]  . insulin aspart (novoLOG) injection 0-9 Units  0-9 Units Subcutaneous TID WC Dustin Flock, MD      . labetalol (NORMODYNE,TRANDATE) injection 10 mg  10 mg Intravenous Q2H PRN Lance Coon, MD   10 mg at 05/23/18 0140  . ondansetron (ZOFRAN) injection 4 mg  4 mg Intravenous Q6H PRN Dustin Flock, MD      . Derrill Memo ON 05/24/2018] pneumococcal 23 valent vaccine (PNU-IMMUNE) injection 0.5 mL  0.5 mL Intramuscular Tomorrow-1000 Sainani, Vivek J, MD      . sodium chloride flush (NS) 0.9 % injection 3 mL  3 mL Intravenous Q12H Dustin Flock, MD   3 mL at 05/23/18 0853  . sodium chloride flush (NS) 0.9 % injection 3 mL  3 mL Intravenous PRN Dustin Flock, MD          Allergies: No Known Allergies    Past Medical History: Past Medical History:  Diagnosis Date  . Diabetes mellitus without complication [facility])      Past Surgical History: Past Surgical History:  Procedure Laterality Date  . EYE SURGERY    . TONSILLECTOMY       Family History: History reviewed. No pertinent family history.   Social History: Social History   Socioeconomic History  . Marital status: Single    Spouse name: Not on file  . Number of children: Not on file  . Years of education: Not on file  . Highest education level: Not on file  Occupational History  . Not on file  Social Needs  . Financial resource strain: Not on file  . Food insecurity:    Worry: Not on file    Inability: Not on file  . Transportation needs:    Medical: Not on file    Non-medical: Not on file  Tobacco Use  . Smoking status: Never Smoker  . Smokeless tobacco: Never Used  Substance and Sexual Activity  . Alcohol use: Not Currently  . Drug use: Not Currently  . Sexual activity: Not Currently  Lifestyle  . Physical activity:    Days per week: Not on file    Minutes per session: Not on  file  . Stress: Not on file  Relationships  . Social connections:    Talks on phone: Not on file    Gets together: Not on file    Attends religious service: Not on file    Active member of club or organization: Not on file    Attends meetings of clubs or organizations: Not on file    Relationship status: Not on file  . Intimate partner violence:    Fear of current or ex partner: Not on file    Emotionally abused: Not on file    Physically abused: Not on file    Forced sexual activity: Not on file  Other Topics Concern  . Not on file  Social History Narrative  . Not on file     Review of Systems: Review of Systems  Constitutional: Negative.  Negative for chills, diaphoresis, fever, malaise/fatigue and weight loss.  HENT: Negative.  Negative for congestion, ear discharge, ear pain, hearing loss, nosebleeds, sinus pain, sore throat and tinnitus.   Eyes: Negative.  Negative for blurred vision, double vision, photophobia, pain, discharge and redness.  Respiratory: Positive for cough, shortness of breath and wheezing. Negative for hemoptysis, sputum production and stridor.   Cardiovascular: Positive for leg swelling. Negative for chest pain, palpitations, orthopnea, claudication and PND.  Gastrointestinal: Negative for abdominal pain, blood in stool, constipation, diarrhea, heartburn, melena, nausea and vomiting.  Genitourinary: Negative.  Negative for dysuria, flank pain, frequency, hematuria and urgency.  Musculoskeletal: Negative.  Negative for back pain, falls, joint pain, myalgias and neck pain.  Skin: Negative.  Negative for itching and rash.  Neurological: Negative for dizziness, tingling, tremors, sensory change, speech change, focal weakness, seizures, loss of consciousness, weakness and headaches.  Endo/Heme/Allergies: Negative for environmental allergies and polydipsia. Does not bruise/bleed easily.  Psychiatric/Behavioral: Negative.  Negative for depression, hallucinations,  memory loss, substance abuse and suicidal ideas. The patient is not nervous/anxious and does not have insomnia.     Vital Signs: Blood pressure (!) 135/99, pulse 93, temperature 98.1 F (36.7 C), temperature source Oral, resp. rate 18, height 6' (1.829 m), weight 126.6 kg, SpO2 97 %.  Weight trends: Filed Weights   05/22/18 1403 05/23/18 0057 05/23/18 0617  Weight: 127 kg 127.4 kg 126.6 kg    Physical Exam: General: NAD,   Head: Normocephalic, atraumatic. Moist oral mucosal membranes  Eyes: Anicteric, PERRL  Neck:  Supple, trachea midline  Lungs:  Clear to auscultation  Heart: Regular rate and rhythm  Abdomen:  Soft, nontender,   Extremities: + peripheral edema.  Neurologic: Nonfocal, moving all four extremities  Skin: No lesions         Lab results: Basic Metabolic Panel: Recent Labs  Lab 05/22/18 1409 05/23/18 0123  NA 142 142  K 3.2* 3.4*  CL 109 110  CO2 23 21*  GLUCOSE 93 108*  BUN 37* 36*  CREATININE 2.56* 2.43*  CALCIUM 9.0 8.8*    Liver Function Tests: Recent Labs  Lab 05/22/18 1409  AST 25  ALT 25  ALKPHOS 57  BILITOT 2.1*  PROT 6.8  ALBUMIN 3.7   No results for input(s): LIPASE, AMYLASE in the last 168 hours. No results for input(s): AMMONIA in the last 168 hours.  CBC: Recent Labs  Lab 05/22/18 1147 05/22/18 1409 05/23/18 0123  WBC 6.0 6.9 8.2  NEUTROABS 4.1 4.7  --   HGB 12.6* 13.1 12.3*  HCT 39.7 41.3 38.8*  MCV 92.1 92.6 93.3  PLT 215 233 221    Cardiac Enzymes: No results for input(s): CKTOTAL, CKMB, CKMBINDEX, TROPONINI in the last 168 hours.  BNP: Invalid input(s): POCBNP  CBG: Recent Labs  Lab 05/22/18 2302 05/23/18 0053 05/23/18 0817  GLUCAP 82 98 87    Microbiology: No results found for this or any previous visit.  Coagulation Studies: No results for input(s): LABPROT, INR in the last 72 hours.  Urinalysis: Recent Labs    05/22/18 2258  COLORURINE STRAW*  LABSPEC 1.006  PHURINE 5.0  GLUCOSEU  NEGATIVE  HGBUR LARGE*  BILIRUBINUR NEGATIVE  KETONESUR NEGATIVE  PROTEINUR 100*  NITRITE NEGATIVE  LEUKOCYTESUR NEGATIVE      Imaging: Dg Chest 2 View  Result Date: 05/22/2018 CLINICAL DATA:  Cough and peripheral edema for the past 2 weeks. History of diabetes. EXAM: CHEST - 2 VIEW COMPARISON:  None. FINDINGS: The lungs are adequately inflated. The cardiac silhouette is enlarged. The pulmonary vascularity is mildly engorged. The interstitial markings are mildly increased. There is no significant pleural effusion. The observed bony thorax is unremarkable. IMPRESSION: Findings worrisome for mild CHF which would be unusual in this age group unless there is a history of cardiac disease. The enlargement of the cardiac silhouette may reflect chamber dilation, muscular hypertrophy, or pericardial effusion in the appropriate clinical setting. Electronically Signed   By: David  Martinique M.D.   On: 05/22/2018 14:37   US Renal  Result Date: 05/23/2018 CLINICAL DATA:  Acute renal failure EXAM: RENAL / URINARY TRACT ULTRASOUND COMPLETE COMPARISON:  Abdominal ultrasound May 12, 2007 FINDINGS: Right Kidney: Renal measurements: 13.9 x 5.4 x 7.3 cm = volume: 283 mL . Echogenicity and renal cortical thickness are within normal limits. No mass, perinephric fluid, or hydronephrosis visualized. No sonographically demonstrable calculus or ureterectasis. Left Kidney: Renal measurements: 13.3 x 7.5 x 6.7 cm = volume: 346 mL. Echogenicity and renal cortical thickness are within normal limits. No mass, perinephric fluid, or hydronephrosis visualized. No sonographically demonstrable calculus or ureterectasis. Bladder: Appears normal for degree of bladder distention. There is trace ascites. There are small pleural effusions bilaterally. IMPRESSION: Normal appearing kidneys bilaterally. Trace ascites. Small pleural effusions bilaterally. Electronically Signed   By: Lowella Grip III M.D.   On: 05/23/2018 08:07       Assessment & Plan: Adrian Evans is a 32 y.o. black male with diabetes mellitus type I, diabetic retinopathy, who was admitted to [facility] on  05/22/2018 for Peripheral edema [R60.9] Acute renal failure (ARF) ([facility]) [N17.9] AKI (acute kidney injury) ([place]) [N17.9]  1. Acute renal failure with proteinuria and hematuria: no known renal baseline. Creatinine of 2.56 on admission.  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  Ultrasound reviewed with patient - Checking urine spot protein to creatinine ratio - Check SPEP/UPEP, ANA, ANCA, anti-GBM, viral hepatits panel, serum complements.   2. Hypertension: new diagnosis. Elevated diastolic this morning.  Furosemide and carvedilol started on admission.  Echocardiogram pending.   3. Hypokalemia - PO potassium chloride  4. 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   LOS: 1 Matther Labell 12/31/201911:11 AM

## 2018-05-23 NOTE — ED Notes (Signed)
Called floor to give report for second time. This RN will call back in 5 minutes to give report

## 2018-05-23 NOTE — Progress Notes (Signed)
[facility] [place] NAME: Adrian Evans    MR#:  [id number]  DATE OF BIRTH:  [birth year]  SUBJECTIVE:   Patient presents to the hospital secondary to shortness of breath and worsening lower extremity edema and noted to be in CHF.  Patient has no previous history of congestive heart failure.  He was also noted to be in acute kidney injury with no previous creatinine known.  Patient says he feels a little bit better today.  REVIEW OF SYSTEMS:    Review of Systems  Constitutional: Negative for chills and fever.  HENT: Negative for congestion and tinnitus.   Eyes: Negative for blurred vision and double vision.  Respiratory: Positive for shortness of breath. Negative for cough and wheezing.   Cardiovascular: Positive for leg swelling and PND. Negative for chest pain and orthopnea.  Gastrointestinal: Negative for abdominal pain, diarrhea, nausea and vomiting.  Genitourinary: Negative for dysuria and hematuria.  Neurological: Negative for dizziness, sensory change and focal weakness.  All other systems reviewed and are negative.   Nutrition: Carb control/Heart Healthy Tolerating Diet: Yes Tolerating PT: Ambulatory  DRUG ALLERGIES:  No Known Allergies  VITALS:  Blood pressure (!) 135/99, pulse 93, temperature 98.1 F (36.7 C), temperature source Oral, resp. rate 18, height 6' (1.829 m), weight 126.6 kg, SpO2 97 %.  PHYSICAL EXAMINATION:   Physical Exam  GENERAL:  32 y.o.-year-old patient lying in bed in no acute distress.  EYES: Pupils equal, round, reactive to light and accommodation. No scleral icterus. Extraocular muscles intact.  HEENT: Head atraumatic, normocephalic. Oropharynx and nasopharynx clear.  NECK:  Supple, no jugular venous distention. No thyroid enlargement, no tenderness.  LUNGS: Normal breath sounds bilaterally, no wheezing, rales, rhonchi. No use of accessory muscles of respiration.  CARDIOVASCULAR: S1, S2 normal.  No murmurs, rubs, or gallops.  ABDOMEN: Soft, nontender, nondistended. Bowel sounds present. No organomegaly or mass.  EXTREMITIES: No cyanosis, clubbing, +1-2 edema b/l.    NEUROLOGIC: Cranial nerves II through XII are intact. No focal Motor or sensory deficits b/l.   PSYCHIATRIC: The patient is alert and oriented x 3.  SKIN: No obvious rash, lesion, or ulcer.    LABORATORY PANEL:   CBC Recent Labs  Lab 05/23/18 0123  WBC 8.2  HGB 12.3*  HCT 38.8*  PLT 221   ------------------------------------------------------------------------------------------------------------------  Chemistries  Recent Labs  Lab 05/22/18 1409 05/23/18 0123  NA 142 142  K 3.2* 3.4*  CL 109 110  CO2 23 21*  GLUCOSE 93 108*  BUN 37* 36*  CREATININE 2.56* 2.43*  CALCIUM 9.0 8.8*  AST 25  --   ALT 25  --   ALKPHOS 57  --   BILITOT 2.1*  --    ------------------------------------------------------------------------------------------------------------------  Cardiac Enzymes No results for input(s): TROPONINI in the last 168 hours. ------------------------------------------------------------------------------------------------------------------  RADIOLOGY:  Dg Chest 2 View  Result Date: 05/22/2018 CLINICAL DATA:  Cough and peripheral edema for the past 2 weeks. History of diabetes. EXAM: CHEST - 2 VIEW COMPARISON:  None. FINDINGS: The lungs are adequately inflated. The cardiac silhouette is enlarged. The pulmonary vascularity is mildly engorged. The interstitial markings are mildly increased. There is no significant pleural effusion. The observed bony thorax is unremarkable. IMPRESSION: Findings worrisome for mild CHF which would be unusual in this age group unless there is a history of cardiac disease. The enlargement of the cardiac silhouette may reflect chamber dilation, muscular hypertrophy, or pericardial effusion in the appropriate  clinical setting. Electronically Signed   By: David  Martinique M.D.    On: 05/22/2018 14:37   US Renal  Result Date: 05/23/2018 CLINICAL DATA:  Acute renal failure EXAM: RENAL / URINARY TRACT ULTRASOUND COMPLETE COMPARISON:  Abdominal ultrasound May 12, 2007 FINDINGS: Right Kidney: Renal measurements: 13.9 x 5.4 x 7.3 cm = volume: 283 mL . Echogenicity and renal cortical thickness are within normal limits. No mass, perinephric fluid, or hydronephrosis visualized. No sonographically demonstrable calculus or ureterectasis. Left Kidney: Renal measurements: 13.3 x 7.5 x 6.7 cm = volume: 346 mL. Echogenicity and renal cortical thickness are within normal limits. No mass, perinephric fluid, or hydronephrosis visualized. No sonographically demonstrable calculus or ureterectasis. Bladder: Appears normal for degree of bladder distention. There is trace ascites. There are small pleural effusions bilaterally. IMPRESSION: Normal appearing kidneys bilaterally. Trace ascites. Small pleural effusions bilaterally. Electronically Signed   By: Lowella Grip III M.D.   On: 05/23/2018 08:07     ASSESSMENT AND PLAN:   32 year old male with past medical history of diabetes who presents to the hospital due to worsening lower extremity edema and shortness of breath.  1.  CHF-this is the cause of patient's worsening lower extreme edema and shortness of breath.  Unclear if this is systolic or diastolic dysfunction.  Patient has no previous history of congestive heart failure. -Continue diuresis with IV Lasix, follow I's and O's and daily weights. - Continue carvedilol.  May need to consider adding some Imdur/hydralazine given the patient's acute renal failure.  2.  Acute kidney injury with proteinuria-suspected to be secondary to underlying diabetic nephropathy/cardiorenal hemodynamics.  Patient's renal ultrasound was negative for acute obstruction.  Appreciate nephrology input.  Serologic work-up has been initiated. - Follow BUN and creatinine with diuresis, further care as per  nephrology.  3.  Hypokalemia-secondary to diuresis.  We will give some oral potassium.  Repeat level in the morning.  4.  Diabetes type 2 without complication- continue sliding scale insulin, follow blood sugars.  5.  Essential hypertension-patient has no previous history of hypertension- blood pressure is somewhat accelerated. - Continue carvedilol, as needed IV labetalol for now.  May need to add further antihypertensives.     All the records are reviewed and case discussed with Care Management/Social Worker. Management plans discussed with the patient, family and they are in agreement.  CODE STATUS: Full code  DVT Prophylaxis: Hep. SQ  TOTAL TIME TAKING CARE OF THIS PATIENT: 30 minutes.   POSSIBLE D/C IN 2-3 DAYS, DEPENDING ON CLINICAL CONDITION.   Henreitta Leber M.D on 05/23/2018 at 4:23 PM  Between 7am to 6pm - Pager - [phone]  After 6pm go to [email or web address] - [employer] [facility]  Office  [phone]  CC: Primary care physician; Lenard Simmer, MD

## 2018-05-23 NOTE — Progress Notes (Signed)
Initial Nutrition Assessment  DOCUMENTATION CODES:   Not applicable  INTERVENTION:   Premier Protein BID, each supplement provides 160 kcal and 30 grams of protein.   MVI daily   Liberalize diet   NUTRITION DIAGNOSIS:   Inadequate oral intake related to acute illness as evidenced by per patient/family report.  GOAL:   Patient will meet greater than or equal to 90% of their needs  MONITOR:   Labs, PO intake, Supplement acceptance, Vent status, Weight trends, Skin, I & O's  REASON FOR ASSESSMENT:   Consult Diet education  ASSESSMENT:   32 y.o. black male with diabetes mellitus type I, diabetic retinopathy, who was admitted to [facility] on 05/22/2018 for Peripheral edema    Met with pt in room today. Pt reports poor appetite and oral intake for several days pta. Pt reports his appetite remains poor today; pt eating <25% of meals in hospital. Pt reports weight gain secondary to fluid changes but is unsure of his UBW. Pt is willing to drink Premier Protein while in hospital. RD will also liberalize pt's diet as a renal diet is very restrictive and pt with low potassium. Pt provided with low sodium diet education today. Pt reports he feels comfortable with carbohydrate controlled diet.    Medications reviewed and include: lasix, heparin, insulin  Labs reviewed: K 3.4(L), BUN 36(H), creat 2.43(H) BNP- 3265(H)- 12/30 Iron 53, TIBC 289, ferritin 405- 12/30 cbgs- 98, 87, 93 x 24 hrs AIC 5.9- 12/30  NUTRITION - FOCUSED PHYSICAL EXAM:    Most Recent Value  Orbital Region  No depletion  Upper Arm Region  No depletion  Thoracic and Lumbar Region  No depletion  Buccal Region  No depletion  Temple Region  No depletion  Clavicle Bone Region  No depletion  Clavicle and Acromion Bone Region  No depletion  Scapular Bone Region  No depletion  Dorsal Hand  No depletion  Patellar Region  No depletion  Anterior Thigh Region  No depletion  Posterior Calf Region  No depletion  Edema (RD  Assessment)  Moderate  Hair  Reviewed  Eyes  Reviewed  Mouth  Reviewed  Skin  Reviewed  Nails  Reviewed     Diet Order:   Diet Order            Diet Carb Modified Fluid consistency: Thin; Room service appropriate? Yes; Fluid restriction: 1200 mL Fluid  Diet effective now             EDUCATION NEEDS:   Education needs have been addressed  Skin:  Skin Assessment: Reviewed RN Assessment  Last BM:  12/30  Height:   Ht Readings from Last 1 Encounters:  05/23/18 6' (1.829 m)    Weight:   Wt Readings from Last 1 Encounters:  05/23/18 126.6 kg    Ideal Body Weight:  80.9 kg  BMI:  Body mass index is 37.87 kg/m.  Estimated Nutritional Needs:   Kcal:  2300-2600kcal/day   Protein:  100-120g/day   Fluid:  1.2L/day per MD  Koleen Distance MS, RD, LDN Pager #- [phone] Office#- [phone] After Hours Pager: [phone]

## 2018-05-23 NOTE — Progress Notes (Signed)
*  PRELIMINARY RESULTS* Echocardiogram 2D Echocardiogram has been performed.  Adrian Evans Adrian Evans 05/23/2018, 3:03 PM

## 2018-05-23 NOTE — Progress Notes (Signed)
Notified MD of blood pressure. Orders placed. Will continue to monitor and assess.
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
## 2018-05-23 NOTE — Care Management Note (Signed)
Case Management Note  Patient Details  Name: Adrian Evans MRN: [id number] Date of Birth: 25-Jan-1986  Subjective/Objective:         Patient is from home with wife with acute CHF.  Receiving IV lasix.  Current with his PCP.  Denies difficulties obtaining medications or with medical care.  Pending ECHO today.  Does not have a scale at home but can get one without a problem.  Has heart failure clinic appointment.  Gets his medications at [facility] on [address].  Independent in all ADL's.              Action/Plan:   Expected Discharge Date:                  Expected Discharge Plan:  Home/Self Care  In-House Referral:     Discharge planning Services  CM Consult, HF Clinic  Post Acute Care Choice:    Choice offered to:     DME Arranged:    DME Agency:     HH Arranged:    HH Agency:     Status of Service:  Completed, signed off  If discussed at [employer] of Stay Meetings, dates discussed:    Additional Comments:  Elza Rafter, RN 05/23/2018, 12:10 PM

## 2018-05-23 NOTE — Plan of Care (Signed)
Nutrition Education Note  RD consulted for nutrition education regarding new onset CHF.  RD provided "Low Sodium Nutrition Therapy" handout from the Academy of Nutrition and Dietetics. Reviewed patient's dietary recall. Provided examples on ways to decrease sodium intake in diet. Discouraged intake of processed foods and use of salt shaker. Encouraged fresh fruits and vegetables as well as whole grain sources of carbohydrates to maximize fiber intake.   RD discussed why it is important for patient to adhere to diet recommendations, and emphasized the role of fluids, foods to avoid, and importance of weighing self daily. Teach back method used.  Expect good compliance.  Body mass index is 37.87 kg/m. Pt meets criteria for obesity based on current BMI.  Current diet order is CHO/low sodium, patient is consuming approximately 15% of meals at this time.   RD following this pt   Koleen Distance MS, RD, LDN Pager #[phone] Office#- [phone] After Hours Pager: [phone]

## 2018-05-23 NOTE — Plan of Care (Signed)
?  Problem: Education: ?Goal: Ability to demonstrate management of disease process will improve ?Outcome: Progressing ?  ?Problem: Activity: ?Goal: Capacity to carry out activities will improve ?Outcome: Progressing ?  ?Problem: Cardiac: ?Goal: Ability to achieve and maintain adequate cardiopulmonary perfusion will improve ?Outcome: Progressing ?  ?

## 2018-05-24 LAB — GLUCOSE, CAPILLARY
Glucose-Capillary: 103 mg/dL — ABNORMAL HIGH (ref 70–99)
Glucose-Capillary: 117 mg/dL — ABNORMAL HIGH (ref 70–99)
Glucose-Capillary: 140 mg/dL — ABNORMAL HIGH (ref 70–99)
Glucose-Capillary: 136 mg/dL — ABNORMAL HIGH (ref 70–99)

## 2018-05-24 LAB — PARATHYROID HORMONE, INTACT (NO CA): PTH: 32 pg/mL (ref 15–65)

## 2018-05-24 LAB — HEPATITIS C ANTIBODY: HCV Ab: 0.1 s/co ratio (ref 0.0–0.9)

## 2018-05-24 LAB — BASIC METABOLIC PANEL
Anion gap: 11 (ref 5–15)
BUN: 35 mg/dL — ABNORMAL HIGH (ref 6–20)
CO2: 21 mmol/L — ABNORMAL LOW (ref 22–32)
Calcium: 8.9 mg/dL (ref 8.9–10.3)
Chloride: 109 mmol/L (ref 98–111)
Creatinine, Ser: 2.43 mg/dL — ABNORMAL HIGH (ref 0.61–1.24)
GFR calc Af Amer: 39 mL/min — ABNORMAL LOW (ref >60)
GFR calc non Af Amer: 33 mL/min — ABNORMAL LOW (ref >60)
Glucose, Bld: 110 mg/dL — ABNORMAL HIGH (ref 70–99)
POTASSIUM: 3.6 mmol/L (ref 3.5–5.1)
Sodium: 141 mmol/L (ref 135–145)

## 2018-05-24 LAB — ECHOCARDIOGRAM COMPLETE
Height: 72 in
Weight: 4467.2 oz

## 2018-05-24 LAB — HEPATITIS B SURFACE ANTIBODY,QUALITATIVE: Hep B S Ab: REACTIVE

## 2018-05-24 LAB — C3 COMPLEMENT: C3 Complement: 94 mg/dL (ref 82–167)

## 2018-05-24 LAB — HIV ANTIBODY (ROUTINE TESTING W REFLEX): HIV Screen 4th Generation wRfx: NONREACTIVE

## 2018-05-24 LAB — HEPATITIS B SURFACE ANTIGEN: Hepatitis B Surface Ag: NEGATIVE

## 2018-05-24 LAB — HEPATITIS B CORE ANTIBODY, IGM: Hep B C IgM: NEGATIVE

## 2018-05-24 LAB — C4 COMPLEMENT: Complement C4, Body Fluid: 28 mg/dL (ref 14–44)

## 2018-05-24 MED ORDER — SODIUM CHLORIDE 0.9 % WEIGHT BASED INFUSION
3.0000 mL/kg/h | INTRAVENOUS | Status: AC
  Administered 2018-05-25: 3 mL/kg/h via INTRAVENOUS

## 2018-05-24 MED ORDER — SODIUM CHLORIDE 0.9% FLUSH: 3.0000 mL | Freq: Two times a day (BID) | INTRAVENOUS | Status: DC

## 2018-05-24 MED ORDER — SODIUM CHLORIDE 0.9 % IV SOLN: 250.0000 mL | INTRAVENOUS | Status: DC | PRN

## 2018-05-24 MED ORDER — LOSARTAN POTASSIUM 50 MG PO TABS: 100.0000 mg | ORAL_TABLET | Freq: Every day | ORAL | Status: DC

## 2018-05-24 MED ORDER — SODIUM CHLORIDE 0.9 % WEIGHT BASED INFUSION
1.0000 mL/kg/h | INTRAVENOUS | Status: DC
  Administered 2018-05-25: 1 mL/kg/h via INTRAVENOUS

## 2018-05-24 MED ORDER — SODIUM CHLORIDE 0.9% FLUSH: 3.0000 mL | INTRAVENOUS | Status: DC | PRN

## 2018-05-24 MED ORDER — ASPIRIN 81 MG PO CHEW
81.0000 mg | CHEWABLE_TABLET | ORAL | Status: AC
  Administered 2018-05-25: 81 mg via ORAL
  Filled 2018-05-24: qty 1

## 2018-05-24 MED ORDER — SODIUM CHLORIDE 0.9 % IV SOLN
INTRAVENOUS | Status: AC
  Administered 2018-05-24: 20:00:00 via INTRAVENOUS

## 2018-05-24 MED ORDER — HYDRALAZINE HCL 25 MG PO TABS
25.0000 mg | ORAL_TABLET | Freq: Three times a day (TID) | ORAL | Status: DC
  Administered 2018-05-24 – 2018-05-27 (×9): 25 mg via ORAL
  Filled 2018-05-24 (×9): qty 1
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
## 2018-05-24 NOTE — Plan of Care (Signed)

## 2018-05-24 NOTE — Progress Notes (Signed)
[place] [facility] [place] NAME: Adrian Evans    MR#:  [id number]  DATE OF BIRTH:  1986/05/24  SUBJECTIVE:   Patient's echocardiogram showing LV dysfunction with EF of 20 to 25%.  Patient denies any chest pain.  Seen by cardiology and plan for cardiac catheterization tomorrow.  REVIEW OF SYSTEMS:    Review of Systems  Constitutional: Negative for chills and fever.  HENT: Negative for congestion and tinnitus.   Eyes: Negative for blurred vision and double vision.  Respiratory: Positive for shortness of breath. Negative for cough and wheezing.   Cardiovascular: Positive for leg swelling and PND. Negative for chest pain and orthopnea.  Gastrointestinal: Negative for abdominal pain, diarrhea, nausea and vomiting.  Genitourinary: Negative for dysuria and hematuria.  Neurological: Negative for dizziness, sensory change and focal weakness.  All other systems reviewed and are negative.   Nutrition: Carb control/Heart Healthy Tolerating Diet: Yes Tolerating PT: Ambulatory  DRUG ALLERGIES:  No Known Allergies  VITALS:  Blood pressure (!) 138/101, pulse 93, temperature 98 F (36.7 C), resp. rate 16, height 6' (1.829 m), weight 123.5 kg, SpO2 96 %.  PHYSICAL EXAMINATION:   Physical Exam  GENERAL:  33 y.o.-year-old patient lying in bed in no acute distress.  EYES: Pupils equal, round, reactive to light and accommodation. No scleral icterus. Extraocular muscles intact.  HEENT: Head atraumatic, normocephalic. Oropharynx and nasopharynx clear.  NECK:  Supple, no jugular venous distention. No thyroid enlargement, no tenderness.  LUNGS: Normal breath sounds bilaterally, no wheezing, rales, rhonchi. No use of accessory muscles of respiration.  CARDIOVASCULAR: S1, S2 normal. No murmurs, rubs, or gallops.  ABDOMEN: Soft, nontender, nondistended. Bowel sounds present. No organomegaly or mass.  EXTREMITIES: No cyanosis, clubbing, +1-2 edema b/l.      NEUROLOGIC: Cranial nerves II through XII are intact. No focal Motor or sensory deficits b/l.   PSYCHIATRIC: The patient is alert and oriented x 3.  SKIN: No obvious rash, lesion, or ulcer.    LABORATORY PANEL:   CBC Recent Labs  Lab 05/23/18 0123  WBC 8.2  HGB 12.3*  HCT 38.8*  PLT 221   ------------------------------------------------------------------------------------------------------------------  Chemistries  Recent Labs  Lab 05/22/18 1409  05/24/18 0548  NA 142   < > 141  K 3.2*   < > 3.6  CL 109   < > 109  CO2 23   < > 21*  GLUCOSE 93   < > 110*  BUN 37*   < > 35*  CREATININE 2.56*   < > 2.43*  CALCIUM 9.0   < > 8.9  AST 25  --   --   ALT 25  --   --   ALKPHOS 57  --   --   BILITOT 2.1*  --   --    < > = values in this interval not displayed.   ------------------------------------------------------------------------------------------------------------------  Cardiac Enzymes No results for input(s): TROPONINI in the last 168 hours. ------------------------------------------------------------------------------------------------------------------  RADIOLOGY:  Dg Chest 2 View  Result Date: 05/22/2018 CLINICAL DATA:  Cough and peripheral edema for the past 2 weeks. History of diabetes. EXAM: CHEST - 2 VIEW COMPARISON:  None. FINDINGS: The lungs are adequately inflated. The cardiac silhouette is enlarged. The pulmonary vascularity is mildly engorged. The interstitial markings are mildly increased. There is no significant pleural effusion. The observed bony thorax is unremarkable. IMPRESSION: Findings worrisome for mild CHF which would be unusual in this age group unless there  is a history of cardiac disease. The enlargement of the cardiac silhouette may reflect chamber dilation, muscular hypertrophy, or pericardial effusion in the appropriate clinical setting. Electronically Signed   By: David  Martinique M.D.   On: 05/22/2018 14:37   US Renal  Result Date:  05/23/2018 CLINICAL DATA:  Acute renal failure EXAM: RENAL / URINARY TRACT ULTRASOUND COMPLETE COMPARISON:  Abdominal ultrasound May 12, 2007 FINDINGS: Right Kidney: Renal measurements: 13.9 x 5.4 x 7.3 cm = volume: 283 mL . Echogenicity and renal cortical thickness are within normal limits. No mass, perinephric fluid, or hydronephrosis visualized. No sonographically demonstrable calculus or ureterectasis. Left Kidney: Renal measurements: 13.3 x 7.5 x 6.7 cm = volume: 346 mL. Echogenicity and renal cortical thickness are within normal limits. No mass, perinephric fluid, or hydronephrosis visualized. No sonographically demonstrable calculus or ureterectasis. Bladder: Appears normal for degree of bladder distention. There is trace ascites. There are small pleural effusions bilaterally. IMPRESSION: Normal appearing kidneys bilaterally. Trace ascites. Small pleural effusions bilaterally. Electronically Signed   By: Lowella Grip III M.D.   On: 05/23/2018 08:07     ASSESSMENT AND PLAN:   33 year old male with past medical history of diabetes who presents to the hospital due to worsening lower extremity edema and shortness of breath.  1.  CHF-this is the cause of patient's worsening lower extremity edema and shortness of breath.   -Acute systolic dysfunction.  Patient's echocardiogram showing severe LV dysfunction with EF of 20 to 25%. -Continue diuresis with IV Lasix, continue carvedilol, will add some low-dose hydralazine.  May consider starting him on ARB post cardiac catheterization.  2.  Acute kidney injury with proteinuria-suspected to be secondary to underlying diabetic nephropathy/cardiorenal hemodynamics.  Patient's renal ultrasound was negative for acute obstruction.  Appreciate nephrology input.  - Follow BUN and creatinine with diuresis, further care as per nephrology.  3.  Cardiomyopathy-patient noted to have a significant cardiomyopathy EF of 20 to 25%.  Unclear if this is ischemic  or nonischemic.   - plan for Cardiac cath in a.m. tomorrow.   4.  Hypokalemia-secondary to diuresis.  Improving with supplementation and will cont. To monitior.   5.  Diabetes type 2 without complication- continue sliding scale insulin - BS stable.   5.  Essential hypertension-patient has no previous history of hypertension.  BP remains elevated.  - Continue carvedilol, will add some Hydralazine. Cont. PRN Labetalol.      All the records are reviewed and case discussed with Care Management/Social Worker. Management plans discussed with the patient, family and they are in agreement.  CODE STATUS: Full code  DVT Prophylaxis: Hep. SQ  TOTAL TIME TAKING CARE OF THIS PATIENT: 30 minutes.   POSSIBLE D/C IN 2-3 DAYS, DEPENDING ON CLINICAL CONDITION.   Henreitta Leber M.D on 05/24/2018 at 1:55 PM  Between 7am to 6pm - Pager - [phone]  After 6pm go to [email or web address] - [employer] [place] [facility]  Office  [phone]  CC: Primary care physician; Lenard Simmer, MD

## 2018-05-24 NOTE — Progress Notes (Signed)
[facility]  ROUNDING NOTE   Subjective:   Wife at bedside. Patient complains of headache.   UOP 2665mL. Furosemide 40mg  IV q12.   Objective:  Vital signs in last 24 hours:  Temp:  [98 F (36.7 C)-98.4 F (36.9 C)] 98 F (36.7 C) (01/01 0744) Pulse Rate:  [88-94] 93 (01/01 0920) Resp:  [16-20] 16 (12/31 2006) BP: (111-146)/(80-107) 138/101 (01/01 0920) SpO2:  [95 %-97 %] 96 % (01/01 0920) Weight:  [123.5 kg] 123.5 kg (01/01 0547)  Weight change: -3.538 kg Filed Weights   05/23/18 0057 05/23/18 0617 05/24/18 0547  Weight: 127.4 kg 126.6 kg 123.5 kg    Intake/Output: I/O last 3 completed shifts: In: 57 [P.O.:720] Out: 2825 [Urine:2825]   Intake/Output this shift:  Total I/O In: -  Out: 300 [Urine:300]  Physical Exam: General: NAD,   Head: Normocephalic, atraumatic. Moist oral mucosal membranes  Eyes: Anicteric, PERRL  Neck: Supple, trachea midline  Lungs:  Clear to auscultation  Heart: Regular rate and rhythm  Abdomen:  Soft, nontender,   Extremities:  + peripheral edema.  Neurologic: Nonfocal, moving all four extremities  Skin: No lesions        Basic Metabolic Panel: Recent Labs  Lab 05/22/18 1409 05/23/18 0123 05/24/18 0548  NA 142 142 141  K 3.2* 3.4* 3.6  CL 109 110 109  CO2 23 21* 21*  GLUCOSE 93 108* 110*  BUN 37* 36* 35*  CREATININE 2.56* 2.43* 2.43*  CALCIUM 9.0 8.8* 8.9    Liver Function Tests: Recent Labs  Lab 05/22/18 1409  AST 25  ALT 25  ALKPHOS 57  BILITOT 2.1*  PROT 6.8  ALBUMIN 3.7   No results for input(s): LIPASE, AMYLASE in the last 168 hours. No results for input(s): AMMONIA in the last 168 hours.  CBC: Recent Labs  Lab 05/22/18 1147 05/22/18 1409 05/23/18 0123  WBC 6.0 6.9 8.2  NEUTROABS 4.1 4.7  --   HGB 12.6* 13.1 12.3*  HCT 39.7 41.3 38.8*  MCV 92.1 92.6 93.3  PLT 215 233 221    Cardiac Enzymes: No results for input(s): CKTOTAL, CKMB, CKMBINDEX, TROPONINI in the last 168  hours.  BNP: Invalid input(s): POCBNP  CBG: Recent Labs  Lab 05/23/18 1205 05/23/18 1647 05/23/18 2052 05/24/18 0744 05/24/18 1141  GLUCAP 93 141* 116* 103* 117*    Microbiology: No results found for this or any previous visit.  Coagulation Studies: No results for input(s): LABPROT, INR in the last 72 hours.  Urinalysis: Recent Labs    05/22/18 2258  COLORURINE STRAW*  LABSPEC 1.006  PHURINE 5.0  GLUCOSEU NEGATIVE  HGBUR LARGE*  BILIRUBINUR NEGATIVE  KETONESUR NEGATIVE  PROTEINUR 100*  NITRITE NEGATIVE  LEUKOCYTESUR NEGATIVE      Imaging: Dg Chest 2 View  Result Date: 05/22/2018 CLINICAL DATA:  Cough and peripheral edema for the past 2 weeks. History of diabetes. EXAM: CHEST - 2 VIEW COMPARISON:  None. FINDINGS: The lungs are adequately inflated. The cardiac silhouette is enlarged. The pulmonary vascularity is mildly engorged. The interstitial markings are mildly increased. There is no significant pleural effusion. The observed bony thorax is unremarkable. IMPRESSION: Findings worrisome for mild CHF which would be unusual in this age group unless there is a history of cardiac disease. The enlargement of the cardiac silhouette may reflect chamber dilation, muscular hypertrophy, or pericardial effusion in the appropriate clinical setting. Electronically Signed   By: David  Martinique M.D.   On: 05/22/2018 14:37   US Renal  Result Date: 05/23/2018 CLINICAL DATA:  Acute renal failure EXAM: RENAL / URINARY TRACT ULTRASOUND COMPLETE COMPARISON:  Abdominal ultrasound May 12, 2007 FINDINGS: Right Kidney: Renal measurements: 13.9 x 5.4 x 7.3 cm = volume: 283 mL . Echogenicity and renal cortical thickness are within normal limits. No mass, perinephric fluid, or hydronephrosis visualized. No sonographically demonstrable calculus or ureterectasis. Left Kidney: Renal measurements: 13.3 x 7.5 x 6.7 cm = volume: 346 mL. Echogenicity and renal cortical thickness are within normal  limits. No mass, perinephric fluid, or hydronephrosis visualized. No sonographically demonstrable calculus or ureterectasis. Bladder: Appears normal for degree of bladder distention. There is trace ascites. There are small pleural effusions bilaterally. IMPRESSION: Normal appearing kidneys bilaterally. Trace ascites. Small pleural effusions bilaterally. Electronically Signed   By: Lowella Grip III M.D.   On: 05/23/2018 08:07     Medications:   . sodium chloride     . carvedilol  6.25 mg Oral BID WC  . furosemide  40 mg Intravenous BID  . heparin  5,000 Units Subcutaneous Q8H  . insulin aspart  0-9 Units Subcutaneous TID WC  . multivitamin with minerals  1 tablet Oral Daily  . protein supplement shake  11 oz Oral BID BM  . sodium chloride flush  3 mL Intravenous Q12H   sodium chloride, acetaminophen, labetalol, ondansetron (ZOFRAN) IV, sodium chloride flush  Assessment/ Plan:  Mr. JAQUAWN SAFFRAN is a 33 y.o. black male with Mr. KOLTIN WEHMEYER is a 33 y.o. black male with diabetes mellitus type I, diabetic retinopathy, who was admitted to [facility] on 05/22/2018 for edema.   1. Acute renal failure with proteinuria and hematuria: no known renal baseline. Creatinine of 2.56 on admission.  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  Nonnephrotic range proteinuria.  Serum complements within normal limits. Hepatitis C negative.  Hepatitis B prior exposure versus vaccination.  Pending SPEP/UPEP, ANA, ANCA, anti-GBM, viral hepatits panel, serum complements.   2. Hypertension: new diagnosis. Elevated diastolic this morning.  Furosemide and carvedilol started on admission.  Echocardiogram reviewed with patient Appreciate cardiology input. Plan on cardiac catheterization for tomorrow. Will hold furosemide and start IV fluids at 1800.   3. Hypokalemia - PO potassium chloride  4. 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  Insulin Dependent.    LOS: 2 Ysabelle Goodroe 1/1/202012:03 PM

## 2018-05-25 ENCOUNTER — Encounter
Admission: EM | Disposition: A | Discharge status: Home / Self Care | Payer: Self-pay | Source: Home / Self Care | Attending: Specialist

## 2018-05-25 ENCOUNTER — Encounter: Payer: Self-pay | Admitting: *Deleted

## 2018-05-25 HISTORY — PX: RIGHT/LEFT HEART CATH AND CORONARY ANGIOGRAPHY: CATH118266

## 2018-05-25 LAB — PROTEIN ELECTROPHORESIS, SERUM
A/G Ratio: 1.3 (ref 0.7–1.7)
Albumin ELP: 3.1 g/dL (ref 2.9–4.4)
Alpha-1-Globulin: 0.2 g/dL (ref 0.0–0.4)
Alpha-2-Globulin: 0.6 g/dL (ref 0.4–1.0)
Beta Globulin: 0.7 g/dL (ref 0.7–1.3)
GAMMA GLOBULIN: 0.8 g/dL (ref 0.4–1.8)
Globulin, Total: 2.4 g/dL (ref 2.2–3.9)
Total Protein ELP: 5.5 g/dL — ABNORMAL LOW (ref 6.0–8.5)

## 2018-05-25 LAB — GLUCOSE, CAPILLARY
GLUCOSE-CAPILLARY: 131 mg/dL — AB (ref 70–99)
Glucose-Capillary: 106 mg/dL — ABNORMAL HIGH (ref 70–99)
Glucose-Capillary: 102 mg/dL — ABNORMAL HIGH (ref 70–99)
Glucose-Capillary: 104 mg/dL — ABNORMAL HIGH (ref 70–99)
Glucose-Capillary: 136 mg/dL — ABNORMAL HIGH (ref 70–99)

## 2018-05-25 LAB — BASIC METABOLIC PANEL
Anion gap: 7 (ref 5–15)
BUN: 33 mg/dL — ABNORMAL HIGH (ref 6–20)
CO2: 25 mmol/L (ref 22–32)
Calcium: 8.6 mg/dL — ABNORMAL LOW (ref 8.9–10.3)
Chloride: 110 mmol/L (ref 98–111)
Creatinine, Ser: 2.17 mg/dL — ABNORMAL HIGH (ref 0.61–1.24)
GFR calc Af Amer: 44 mL/min — ABNORMAL LOW (ref >60)
GFR calc non Af Amer: 38 mL/min — ABNORMAL LOW (ref >60)
Glucose, Bld: 123 mg/dL — ABNORMAL HIGH (ref 70–99)
Potassium: 3.2 mmol/L — ABNORMAL LOW (ref 3.5–5.1)
Sodium: 142 mmol/L (ref 135–145)

## 2018-05-25 LAB — PROTEIN ELECTRO, RANDOM URINE
Albumin ELP, Urine: 80.8 %
Alpha-1-Globulin, U: 0.3 %
Alpha-2-Globulin, U: 1.7 %
Beta Globulin, U: 8.5 %
Gamma Globulin, U: 8.7 %
Total Protein, Urine: 25.9 mg/dL

## 2018-05-25 LAB — KAPPA/LAMBDA LIGHT CHAINS
Kappa free light chain: 27.3 mg/L — ABNORMAL HIGH (ref 3.3–19.4)
Kappa, lambda light chain ratio: 1.07 (ref 0.26–1.65)
Lambda free light chains: 25.4 mg/L (ref 5.7–26.3)

## 2018-05-25 LAB — ANA W/REFLEX: ANA: NEGATIVE

## 2018-05-25 LAB — PHOSPHORUS: Phosphorus: 4.1 mg/dL (ref 2.5–4.6)

## 2018-05-25 LAB — MPO/PR-3 (ANCA) ANTIBODIES
ANCA Proteinase 3: <3.5 U/mL (ref 0.0–3.5)
Myeloperoxidase Abs: <9 U/mL (ref 0.0–9.0)

## 2018-05-25 LAB — GLOMERULAR BASEMENT MEMBRANE ANTIBODIES: GBM Ab: 2 units (ref 0–20)

## 2018-05-25 SURGERY — RIGHT/LEFT HEART CATH AND CORONARY ANGIOGRAPHY
Anesthesia: Moderate Sedation

## 2018-05-25 MED ORDER — FUROSEMIDE 10 MG/ML IJ SOLN
40.0000 mg | Freq: Two times a day (BID) | INTRAMUSCULAR | Status: DC
  Administered 2018-05-25 – 2018-05-26 (×2): 40 mg via INTRAVENOUS
  Filled 2018-05-25 (×2): qty 4

## 2018-05-25 MED ORDER — MIDAZOLAM HCL 2 MG/2ML IJ SOLN
INTRAMUSCULAR | Status: AC
  Filled 2018-05-25: qty 2

## 2018-05-25 MED ORDER — SODIUM CHLORIDE 0.9 % WEIGHT BASED INFUSION: 1.0000 mL/kg/h | INTRAVENOUS | Status: DC

## 2018-05-25 MED ORDER — IOPAMIDOL (ISOVUE-300) INJECTION 61%
INTRAVENOUS | Status: DC | PRN
  Administered 2018-05-25: 95 mL via INTRA_ARTERIAL

## 2018-05-25 MED ORDER — ACETAMINOPHEN 325 MG PO TABS: 650.0000 mg | ORAL_TABLET | ORAL | Status: DC | PRN

## 2018-05-25 MED ORDER — CARVEDILOL 12.5 MG PO TABS
12.5000 mg | ORAL_TABLET | Freq: Two times a day (BID) | ORAL | Status: DC
  Administered 2018-05-25 – 2018-05-27 (×4): 12.5 mg via ORAL
  Filled 2018-05-25 (×5): qty 1

## 2018-05-25 MED ORDER — ONDANSETRON HCL 4 MG/2ML IJ SOLN: 4.0000 mg | Freq: Four times a day (QID) | INTRAMUSCULAR | Status: DC | PRN

## 2018-05-25 MED ORDER — FENTANYL CITRATE (PF) 100 MCG/2ML IJ SOLN
INTRAMUSCULAR | Status: DC | PRN
  Administered 2018-05-25: 50 ug via INTRAVENOUS

## 2018-05-25 MED ORDER — FENTANYL CITRATE (PF) 100 MCG/2ML IJ SOLN
INTRAMUSCULAR | Status: AC
  Filled 2018-05-25: qty 2

## 2018-05-25 MED ORDER — HEPARIN (PORCINE) IN NACL 1000-0.9 UT/500ML-% IV SOLN
INTRAVENOUS | Status: AC
  Filled 2018-05-25: qty 1000

## 2018-05-25 MED ORDER — SODIUM CHLORIDE 0.9 % WEIGHT BASED INFUSION: 10.0000 mL/h | INTRAVENOUS | Status: AC

## 2018-05-25 MED ORDER — POTASSIUM CHLORIDE CRYS ER 20 MEQ PO TBCR
40.0000 meq | EXTENDED_RELEASE_TABLET | Freq: Once | ORAL | Status: AC
  Administered 2018-05-25: 40 meq via ORAL
  Filled 2018-05-25: qty 2

## 2018-05-25 MED ORDER — MIDAZOLAM HCL 2 MG/2ML IJ SOLN
INTRAMUSCULAR | Status: DC | PRN
  Administered 2018-05-25: 2 mg via INTRAVENOUS

## 2018-05-25 SURGICAL SUPPLY — 14 items
CATH INFINITI 5FR ANG PIGTAIL (CATHETERS) ×1 IMPLANT
CATH INFINITI 5FR JL4 (CATHETERS) ×1 IMPLANT
CATH INFINITI JR4 5F (CATHETERS) ×1 IMPLANT
CATH SWANZ 7F THERMO (CATHETERS) ×1 IMPLANT
DEVICE CLOSURE MYNXGRIP 5F (Vascular Products) ×1 IMPLANT
GUIDEWIRE EMER 3M J .025X150CM (WIRE) ×1 IMPLANT
KIT MANI 3VAL PERCEP (MISCELLANEOUS) ×2 IMPLANT
KIT RIGHT HEART (MISCELLANEOUS) ×2 IMPLANT
NDL PERC 18GX7CM (NEEDLE) IMPLANT
NEEDLE PERC 18GX7CM (NEEDLE) ×2 IMPLANT
PACK CARDIAC CATH (CUSTOM PROCEDURE TRAY) ×2 IMPLANT
SHEATH AVANTI 5FR X 11CM (SHEATH) ×1 IMPLANT
SHEATH AVANTI 7FRX11 (SHEATH) ×1 IMPLANT
WIRE GUIDERIGHT .035X150 (WIRE) ×1 IMPLANT

## 2018-05-25 NOTE — Progress Notes (Signed)
Femoral cath site is clean and dry.  Surrounding skin is soft.  Bilateral pedal pulses are 2+.  No c/o discomfort.  Up to bathroom with assistance and no difficulty.

## 2018-05-25 NOTE — OR Nursing (Signed)
Dr Nehemiah Massed aware of bp meds held pre cath, no new orders received except to leave NS infusing at 10 ml/hr which pt was on when received back from cath lab

## 2018-05-25 NOTE — Plan of Care (Signed)
  Problem: Education: Goal: Ability to demonstrate management of disease process will improve Outcome: Progressing Goal: Ability to verbalize understanding of medication therapies will improve Outcome: Progressing   Problem: Activity: Goal: Capacity to carry out activities will improve Outcome: Progressing   Problem: Clinical Measurements: Goal: Respiratory complications will improve Outcome: Progressing Goal: Cardiovascular complication will be avoided Outcome: Progressing   Problem: Activity: Goal: Risk for activity intolerance will decrease Outcome: Progressing

## 2018-05-25 NOTE — Progress Notes (Signed)
Cardiovascular and Pulmonary Nurse Navigator Note:    33 year old with DM, HTN, HLD, who presented to ED with SOB and lower extremity edema. BNP on admission was 3,265.  ____________________________________________________  CLINICAL DATA:  Cough and peripheral edema for the past 2 weeks. History of diabetes.  EXAM: CHEST - 2 VIEW  COMPARISON:  None.  FINDINGS: The lungs are adequately inflated. The cardiac silhouette is enlarged. The pulmonary vascularity is mildly engorged. The interstitial markings are mildly increased. There is no significant pleural effusion. The observed bony thorax is unremarkable.  IMPRESSION: Findings worrisome for mild CHF which would be unusual in this age group unless there is a history of cardiac disease. The enlargement of the cardiac silhouette may reflect chamber dilation, muscular hypertrophy, or pericardial effusion in the appropriate clinical setting.   __________________________________________________  Result status: Final result  ------------------------------------------------------------------- Transthoracic Echocardiography  Patient:    Adrian Evans, Adrian Evans MR #:       [id number] Study Date: 05/23/2018 Gender:     M Age:        24 Height:     182.9 cm Weight:     126.6 kg BSA:        2.59 m^2 Pt. Status: Room:   ATTENDING    Posey Pronto, Shreyang Lona Kettle, Shreyang H  REFERRING    Posey Pronto, Shreyang H  PERFORMING   [place]  SONOGRAPHER  Charmayne Sheer, RDCS  cc:  ------------------------------------------------------------------- LV EF: 20% -   25%  ------------------------------------------------------------------- Indications:      CHF-Acute Diastolic (616.07).  ------------------------------------------------------------------- History:   PMH:  CKD.  Risk factors:  Diabetes mellitus.  ------------------------------------------------------------------- Study Conclusions  - Left ventricle: The  cavity size was severely dilated. Systolic   function was severely reduced. The estimated ejection fraction   was in the range of 20% to 25%. Diffuse hypokinesis. - Aortic valve: There was trivial regurgitation. - Mitral valve: There was moderate regurgitation. - Left atrium: The atrium was mildly dilated. - Right ventricle: The cavity size was severely dilated. - Right atrium: The atrium was mildly dilated. - Tricuspid valve: There was moderate regurgitation.    Cardiac Cath performed on 05/25/2018:   Panel Physicians Referring Physician Case Authorizing Physician  Corey Skains, MD (Primary)  Corey Skains, MD  Procedures   RIGHT/LEFT HEART CATH AND CORONARY ANGIOGRAPHY  Conclusion     Hemodynamic findings consistent with moderate pulmonary hypertension.  There is mild (2+) tricuspid regurgitation.   Assessment The patient has had Acute systolic dysfunction congestive heart failure with New York Heart Association Class IV symptoms.  reduced left ventricular function with ejection fraction of 20%  Pulmonary capillary wedge pressures with moderate elevation. moderate pulmonary hypertension  normal coronary arteries with no 3 vessel arterial disease and up to 0% stenosis  Plan Aggressive medical management of congestive heart failure with appropriate use of beta blockers, ACE inhibitors, and diuretics. Congestive heart failure education and rehabilitation have been recommended.     CHF Education:?? Educational session with patient / significant other / and family completed. Note:  Patient gave verbal permission for this RN to speak in front of his family in the room.   Reviewed "Living Better with Heart Failure" packet with patient / family. Briefly reviewed definition of heart failure and signs and symptoms of an exacerbation.?Explained to patient that HF is a chronic illness which requires self-assessment / self-management along with help from the  cardiologist/PCP.?? *Reviewed importance of and reason  behind checking weight daily in the AM, after using the bathroom, but before getting dressed. Patient will purchase scale.   ? *Reviewed with patient the following information: *Discussed when to call the Dr= weight gain of >2-3lb overnight of 5lb in a week,  *Discussed yellow zone= call MD: weight gain of >2-3lb overnight of 5lb in a week, increased swelling, increased SOB when lying down, chest discomfort, dizziness, increased fatigue *Red Zone= call 911: struggle to breath, fainting or near fainting, significant chest pain   *Diet - Reviewed low sodium diet-provided handout of recommended and not recommended foods.  Dietitian Consultation for education on low sodium / carb modified / renal diet.   ? *Discussed fluid intake with patient as well. Patient is currently on 1200 ml fluid restriction.  This RN used the water pitcher to demonstrate this amount.  Instructed patient to ask nephrologist / cardiologist what fluid restriction he should follow upon discharge.   ? *Instructed patient to take medications as prescribed for heart failure. Explained briefly why pt is on the medications (either make you feel better, live longer or keep you out of the hospital) and discussed monitoring and side effects.  ? *Discussed exercise. Patient informed this RN that he does not currently exercise or work out.  Explained to patient / family the cardiologist has referred him to outpatient Cardiac Rehab.  Information sheet with CPT billing codes and orientation / class times; brochure, and this RN's business card given to patient.   ? *Smoking Cessation- Patient is a NEVER smoker.? ? *[facility] - Explained the purpose of the HF Clinic. ?Explained to patient the HF Clinic does not replace PCP nor Cardiologist, but is an additional resource to helping patient manage heart failure at home. New patient appointment scheduled for 06/02/2018 at 9:20  a.m.   ? Again, the 5 Steps to Living Better with Heart Failure were reviewed with patient / family.  ? Patient / family thanked me for providing the above information. ? ? Roanna Epley, RN, BSN, [facility]? [facility]  Cardiovascular &?Pulmonary Nurse Navigator  Direct Line: [phone]  Department Phone #: [phone] Fax: [phone]? Email Address: [email or web address]

## 2018-05-25 NOTE — Plan of Care (Signed)
Site is clean and dry. Up to BR.Pedal pulses 2+

## 2018-05-25 NOTE — Progress Notes (Signed)
[facility]  ROUNDING NOTE   Subjective:   Wife at bedside.   UOP 2867mL. Creatinine 2.17 (2.43)  Objective:  Vital signs in last 24 hours:  Temp:  [97.7 F (36.5 C)-98.4 F (36.9 C)] 98 F (36.7 C) (01/02 0828) Pulse Rate:  [84-91] 89 (01/02 0828) Resp:  [14-20] 20 (01/02 0828) BP: (122-145)/(88-106) 145/98 (01/02 0828) SpO2:  [92 %-97 %] 97 % (01/02 0828) Weight:  [120.9 kg] 120.9 kg (01/02 0449)  Weight change: -2.569 kg Filed Weights   05/23/18 0617 05/24/18 0547 05/25/18 0449  Weight: 126.6 kg 123.5 kg 120.9 kg    Intake/Output: I/O last 3 completed shifts: In: 719.3 [I.V.:719.3] Out: 3700 [Urine:3700]   Intake/Output this shift:  No intake/output data recorded.  Physical Exam: General: NAD,   Head: Normocephalic, atraumatic. Moist oral mucosal membranes  Eyes: Anicteric, PERRL  Neck: Supple, trachea midline  Lungs:  Clear to auscultation  Heart: Regular rate and rhythm  Abdomen:  Soft, nontender,   Extremities:  + peripheral edema.  Neurologic: Nonfocal, moving all four extremities  Skin: No lesions        Basic Metabolic Panel: Recent Labs  Lab 05/22/18 1409 05/23/18 0123 05/24/18 0548 05/25/18 0631  NA 142 142 141 142  K 3.2* 3.4* 3.6 3.2*  CL 109 110 109 110  CO2 23 21* 21* 25  GLUCOSE 93 108* 110* 123*  BUN 37* 36* 35* 33*  CREATININE 2.56* 2.43* 2.43* 2.17*  CALCIUM 9.0 8.8* 8.9 8.6*  PHOS  --   --   --  4.1    Liver Function Tests: Recent Labs  Lab 05/22/18 1409  AST 25  ALT 25  ALKPHOS 57  BILITOT 2.1*  PROT 6.8  ALBUMIN 3.7   No results for input(s): LIPASE, AMYLASE in the last 168 hours. No results for input(s): AMMONIA in the last 168 hours.  CBC: Recent Labs  Lab 05/22/18 1147 05/22/18 1409 05/23/18 0123  WBC 6.0 6.9 8.2  NEUTROABS 4.1 4.7  --   HGB 12.6* 13.1 12.3*  HCT 39.7 41.3 38.8*  MCV 92.1 92.6 93.3  PLT 215 233 221    Cardiac Enzymes: No results for input(s): CKTOTAL, CKMB,  CKMBINDEX, TROPONINI in the last 168 hours.  BNP: Invalid input(s): POCBNP  CBG: Recent Labs  Lab 05/24/18 1141 05/24/18 1619 05/24/18 1955 05/25/18 0451 05/25/18 0829  GLUCAP 117* 140* 136* 106* 102*    Microbiology: No results found for this or any previous visit.  Coagulation Studies: No results for input(s): LABPROT, INR in the last 72 hours.  Urinalysis: Recent Labs    05/22/18 2258  COLORURINE STRAW*  LABSPEC 1.006  PHURINE 5.0  GLUCOSEU NEGATIVE  HGBUR LARGE*  BILIRUBINUR NEGATIVE  KETONESUR NEGATIVE  PROTEINUR 100*  NITRITE NEGATIVE  LEUKOCYTESUR NEGATIVE      Imaging: No results found.   Medications:   . sodium chloride    . sodium chloride    . sodium chloride 100 mL/hr at 05/25/18 0309  . sodium chloride 1 mL/kg/hr (05/25/18 0656)   . carvedilol  6.25 mg Oral BID WC  . heparin  5,000 Units Subcutaneous Q8H  . hydrALAZINE  25 mg Oral Q8H  . insulin aspart  0-9 Units Subcutaneous TID WC  . multivitamin with minerals  1 tablet Oral Daily  . protein supplement shake  11 oz Oral BID BM  . sodium chloride flush  3 mL Intravenous Q12H  . sodium chloride flush  3 mL Intravenous Q12H  sodium chloride, sodium chloride, acetaminophen, labetalol, ondansetron (ZOFRAN) IV, sodium chloride flush, sodium chloride flush  Assessment/ Plan:  [name] is a 33 y.o. black male with [name] is a 33 y.o. black male with diabetes mellitus type I, diabetic retinopathy, who was admitted to [facility] on 05/22/2018 for edema.   1. Acute renal failure with proteinuria and hematuria: no known renal baseline. Creatinine of 2.56 on admission.  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  Nonnephrotic range proteinuria.  Pending SPEP/UPEP, ANA, ANCA, anti-GBM, viral hepatits panel.   2. Hypertension: new diagnosis. Elevated diastolic this morning.  Furosemide and  carvedilol started on admission.  Echocardiogram reviewed with patient Appreciate cardiology input. Plan on cardiac catheterization for later today  3. Hypokalemia - PO potassium chloride  4. 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  Insulin Dependent.    LOS: 3 Dayan Kreis 1/2/202011:09 AM

## 2018-05-25 NOTE — Progress Notes (Signed)
Nutrition Brief Note   RD received consult for CHF diet education. Pt previously educated on low sodium education this admit (see note from 12/31). RD following this patient for poor appetite and oral intake.  Koleen Distance MS, RD, LDN Pager #- [phone] Office#- [phone] After Hours Pager: [phone]
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
## 2018-05-25 NOTE — OR Nursing (Signed)
Unable to place 20 gauge iv in right [facility], cath lab staff indicate plan is to do right and left heart cath via right groin. Second IV placed right hand 22 gauge. NSLocked

## 2018-05-25 NOTE — Progress Notes (Signed)
[place] [facility] [place] NAME: Adrian Evans    MR#:  [id number]  DATE OF BIRTH:  Sep 29, 1985  SUBJECTIVE:   Lower extremity edema has improved.  Denies any chest pains.  No shortness of breath.  Status post cardiac catheterization today showing no significant coronary disease but moderate pulmonary hypertension and likely nonischemic cardiomyopathy.  REVIEW OF SYSTEMS:    Review of Systems  Constitutional: Negative for chills and fever.  HENT: Negative for congestion and tinnitus.   Eyes: Negative for blurred vision and double vision.  Respiratory: Positive for shortness of breath. Negative for cough and wheezing.   Cardiovascular: Positive for leg swelling and PND. Negative for chest pain and orthopnea.  Gastrointestinal: Negative for abdominal pain, diarrhea, nausea and vomiting.  Genitourinary: Negative for dysuria and hematuria.  Neurological: Negative for dizziness, sensory change and focal weakness.  All other systems reviewed and are negative.   Nutrition: Carb control/Heart Healthy Tolerating Diet: Yes Tolerating PT: Ambulatory  DRUG ALLERGIES:  No Known Allergies  VITALS:  Blood pressure (!) 147/108, pulse 86, temperature 98 F (36.7 C), temperature source Oral, resp. rate 16, height 6' (1.829 m), weight 120.9 kg, SpO2 95 %.  PHYSICAL EXAMINATION:   Physical Exam  GENERAL:  33 y.o.-year-old patient lying in bed in no acute distress.  EYES: Pupils equal, round, reactive to light and accommodation. No scleral icterus. Extraocular muscles intact.  HEENT: Head atraumatic, normocephalic. Oropharynx and nasopharynx clear.  NECK:  Supple, no jugular venous distention. No thyroid enlargement, no tenderness.  LUNGS: Normal breath sounds bilaterally, no wheezing, rales, rhonchi. No use of accessory muscles of respiration.  CARDIOVASCULAR: S1, S2 normal. No murmurs, rubs, or gallops.  ABDOMEN: Soft, nontender, nondistended. Bowel  sounds present. No organomegaly or mass.  EXTREMITIES: No cyanosis, clubbing, +1-2 edema b/l.    NEUROLOGIC: Cranial nerves II through XII are intact. No focal Motor or sensory deficits b/l.   PSYCHIATRIC: The patient is alert and oriented x 3.  SKIN: No obvious rash, lesion, or ulcer.    LABORATORY PANEL:   CBC Recent Labs  Lab 05/23/18 0123  WBC 8.2  HGB 12.3*  HCT 38.8*  PLT 221   ------------------------------------------------------------------------------------------------------------------  Chemistries  Recent Labs  Lab 05/22/18 1409  05/25/18 0631  NA 142   < > 142  K 3.2*   < > 3.2*  CL 109   < > 110  CO2 23   < > 25  GLUCOSE 93   < > 123*  BUN 37*   < > 33*  CREATININE 2.56*   < > 2.17*  CALCIUM 9.0   < > 8.6*  AST 25  --   --   ALT 25  --   --   ALKPHOS 57  --   --   BILITOT 2.1*  --   --    < > = values in this interval not displayed.   ------------------------------------------------------------------------------------------------------------------  Cardiac Enzymes No results for input(s): TROPONINI in the last 168 hours. ------------------------------------------------------------------------------------------------------------------  RADIOLOGY:  No results found.   ASSESSMENT AND PLAN:   33 year old male with past medical history of diabetes who presents to the hospital due to worsening lower extremity edema and shortness of breath.  1.  CHF-this is the cause of patient's worsening lower extremity edema and shortness of breath.   -Acute systolic dysfunction.  Patient's echocardiogram showing severe LV dysfunction with EF of 20 to 25%. -Continue diuresis with IV Lasix,  continue carvedilol, low dose hydralazine.  -Patient is 5.5 L negative since admission.  Improving.  2.  Acute kidney injury with proteinuria-suspected to be secondary to underlying diabetic nephropathy/cardiorenal hemodynamics.  Patient's renal ultrasound was negative for acute  obstruction.  Appreciate nephrology input. Cr. Stable with diuresis and will cont. To monitor.   3.  Cardiomyopathy-patient noted to have a significant cardiomyopathy EF of 20 to 25%.  -Status post cardiac catheterization today showing no significant coronary artery disease.  It shows moderate pulmonary hypertension.  This is likely nonischemic cardiomyopathy.  Continue optimization of heart failure for now.  Appreciate cardiology input.  4.  Hypokalemia-secondary to diuresis.  Improving with supplementation and will cont. To monitior.   5.  Diabetes type 2 without complication- continue sliding scale insulin - BS stable.   5.  Essential hypertension- cont. Coreg, Hydralazine.    All the records are reviewed and case discussed with Care Management/Social Worker. Management plans discussed with the patient, family and they are in agreement.  CODE STATUS: Full code  DVT Prophylaxis: Hep. SQ  TOTAL TIME TAKING CARE OF THIS PATIENT: 30 minutes.   POSSIBLE D/C IN 2-3 DAYS, DEPENDING ON CLINICAL CONDITION.   Henreitta Leber M.D on 05/25/2018 at 3:17 PM  Between 7am to 6pm - Pager - [phone]  After 6pm go to [email or web address] - Technical [employer]wer [facility]  Office  [phone]  CC: Primary care physician; Lenard Simmer, MD

## 2018-05-26 LAB — GLUCOSE, CAPILLARY
GLUCOSE-CAPILLARY: 105 mg/dL — AB (ref 70–99)
GLUCOSE-CAPILLARY: 131 mg/dL — AB (ref 70–99)
Glucose-Capillary: 138 mg/dL — ABNORMAL HIGH (ref 70–99)
Glucose-Capillary: 112 mg/dL — ABNORMAL HIGH (ref 70–99)

## 2018-05-26 LAB — RENAL FUNCTION PANEL
Albumin: 3.2 g/dL — ABNORMAL LOW (ref 3.5–5.0)
Anion gap: 10 (ref 5–15)
BUN: 29 mg/dL — ABNORMAL HIGH (ref 6–20)
CHLORIDE: 108 mmol/L (ref 98–111)
CO2: 24 mmol/L (ref 22–32)
Calcium: 8.7 mg/dL — ABNORMAL LOW (ref 8.9–10.3)
Creatinine, Ser: 1.99 mg/dL — ABNORMAL HIGH (ref 0.61–1.24)
GFR calc Af Amer: 49 mL/min — ABNORMAL LOW (ref >60)
GFR calc non Af Amer: 43 mL/min — ABNORMAL LOW (ref >60)
Glucose, Bld: 116 mg/dL — ABNORMAL HIGH (ref 70–99)
Phosphorus: 4.1 mg/dL (ref 2.5–4.6)
Potassium: 3.2 mmol/L — ABNORMAL LOW (ref 3.5–5.1)
Sodium: 142 mmol/L (ref 135–145)

## 2018-05-26 MED ORDER — POTASSIUM CHLORIDE CRYS ER 20 MEQ PO TBCR
40.0000 meq | EXTENDED_RELEASE_TABLET | Freq: Once | ORAL | Status: AC
  Administered 2018-05-26: 40 meq via ORAL
  Filled 2018-05-26: qty 2

## 2018-05-26 MED ORDER — LOSARTAN POTASSIUM 25 MG PO TABS
25.0000 mg | ORAL_TABLET | Freq: Every day | ORAL | Status: DC
  Administered 2018-05-26 – 2018-05-27 (×2): 25 mg via ORAL
  Filled 2018-05-26 (×2): qty 1

## 2018-05-26 MED ORDER — FUROSEMIDE 10 MG/ML IJ SOLN
60.0000 mg | Freq: Two times a day (BID) | INTRAMUSCULAR | Status: DC
  Administered 2018-05-26 – 2018-05-27 (×2): 60 mg via INTRAVENOUS
  Filled 2018-05-26 (×2): qty 6

## 2018-05-26 NOTE — Progress Notes (Addendum)
[place] [facility] [place] NAME: Adrian Evans    MR#:  [id number]  DATE OF BIRTH:  [birth year]  SUBJECTIVE:   Continues to have significant lower extremity edema.  Denies any chest pains, worsening shortness of breath.  Renal function improved since yesterday.  Cardiac catheterization yesterday showing no coronary disease and likely nonischemic cardiomyopathy. Wife at bedside.   REVIEW OF SYSTEMS:    Review of Systems  Constitutional: Negative for chills and fever.  HENT: Negative for congestion and tinnitus.   Eyes: Negative for blurred vision and double vision.  Respiratory: Negative for cough, shortness of breath and wheezing.   Cardiovascular: Positive for leg swelling and PND. Negative for chest pain and orthopnea.  Gastrointestinal: Negative for abdominal pain, diarrhea, nausea and vomiting.  Genitourinary: Negative for dysuria and hematuria.  Neurological: Negative for dizziness, sensory change and focal weakness.  All other systems reviewed and are negative.   Nutrition: Carb control/Heart Healthy Tolerating Diet: Yes Tolerating PT: Ambulatory  DRUG ALLERGIES:  No Known Allergies  VITALS:  Blood pressure (!) 143/99, pulse 89, temperature 98.4 F (36.9 C), temperature source Oral, resp. rate 19, height 6' (1.829 m), weight 121.9 kg, SpO2 98 %.  PHYSICAL EXAMINATION:   Physical Exam  GENERAL:  33 y.o.-year-old patient lying in bed in no acute distress.  EYES: Pupils equal, round, reactive to light and accommodation. No scleral icterus. Extraocular muscles intact.  HEENT: Head atraumatic, normocephalic. Oropharynx and nasopharynx clear.  NECK:  Supple, no jugular venous distention. No thyroid enlargement, no tenderness.  LUNGS: Normal breath sounds bilaterally, no wheezing, rales, rhonchi. No use of accessory muscles of respiration.  CARDIOVASCULAR: S1, S2 normal. No murmurs, rubs, + s3 gallop.  ABDOMEN: Soft, nontender,  nondistended. Bowel sounds present. No organomegaly or mass.  EXTREMITIES: No cyanosis, clubbing, +1-2 edema b/l.    NEUROLOGIC: Cranial nerves II through XII are intact. No focal Motor or sensory deficits b/l.   PSYCHIATRIC: The patient is alert and oriented x 3.  SKIN: No obvious rash, lesion, or ulcer.    LABORATORY PANEL:   CBC Recent Labs  Lab 05/23/18 0123  WBC 8.2  HGB 12.3*  HCT 38.8*  PLT 221   ------------------------------------------------------------------------------------------------------------------  Chemistries  Recent Labs  Lab 05/22/18 1409  05/26/18 0834  NA 142   < > 142  K 3.2*   < > 3.2*  CL 109   < > 108  CO2 23   < > 24  GLUCOSE 93   < > 116*  BUN 37*   < > 29*  CREATININE 2.56*   < > 1.99*  CALCIUM 9.0   < > 8.7*  AST 25  --   --   ALT 25  --   --   ALKPHOS 57  --   --   BILITOT 2.1*  --   --    < > = values in this interval not displayed.   ------------------------------------------------------------------------------------------------------------------  Cardiac Enzymes No results for input(s): TROPONINI in the last 168 hours. ------------------------------------------------------------------------------------------------------------------  RADIOLOGY:  No results found.   ASSESSMENT AND PLAN:   33 year old male with past medical history of diabetes who presents to the hospital due to worsening lower extremity edema and shortness of breath.  1.  CHF-this is the cause of patient's worsening lower extremity edema and shortness of breath.   -Acute systolic dysfunction.  Patient's echocardiogram showing severe LV dysfunction with EF of 20 to 25%. -Continue diuresis  with IV Lasix and will increase dose, continue carvedilol, low dose hydralazine and losartan added by Nerphrology.  -Patient is 7 L negative since admission and improving and will cont. Further diuresis for now.   2.  Acute kidney injury with proteinuria-suspected to be  secondary to underlying diabetic nephropathy/cardiorenal hemodynamics.  Patient's renal ultrasound was negative for acute obstruction.  Appreciate nephrology input.  - Cr. Improving with diuresis and will cont. To monitor.   3.  Cardiomyopathy-patient noted to have a significant cardiomyopathy EF of 20 to 25%.  -Status post cardiac catheterization yesteday showing no significant coronary artery disease.  It shows moderate pulmonary hypertension.  This is likely nonischemic cardiomyopathy.  - Continue optimization of heart failure for now.  Appreciate cardiology input.  4.  Hypokalemia-secondary to diuresis.  Improving with supplementation and will cont. To monitior.   5.  Diabetes type 2 without complication- continue sliding scale insulin - BS stable.   6.  Essential hypertension- cont. Coreg, Hydralazine and low dose Losartan added today.    All the records are reviewed and case discussed with Care Management/Social Worker. Management plans discussed with the patient, family and they are in agreement.  CODE STATUS: Full code  DVT Prophylaxis: Hep. SQ  TOTAL TIME TAKING CARE OF THIS PATIENT: 30 minutes.   POSSIBLE D/C IN 2-3 DAYS, DEPENDING ON CLINICAL CONDITION.   Henreitta Leber M.D on 05/26/2018 at 4:16 PM  Between 7am to 6pm - Pager - [phone]  After 6pm go to [email or web address] - [employer] [facility]  Office  [phone]  CC: Primary care physician; Lenard Simmer, MD

## 2018-05-26 NOTE — Plan of Care (Signed)
  Problem: Activity: Goal: Risk for activity intolerance will decrease Outcome: Progressing   Problem: Pain Managment: Goal: General experience of comfort will improve Outcome: Progressing   Problem: Safety: Goal: Ability to remain free from injury will improve Outcome: Progressing   Problem: Cardiovascular: Goal: Vascular access site(s) Level 0-1 will be maintained Outcome: Progressing

## 2018-05-26 NOTE — Progress Notes (Signed)
[facility]  ROUNDING NOTE   Subjective:   Wife at bedside.   Furosemide IV 40mg  daily  Objective:  Vital signs in last 24 hours:  Temp:  [98.1 F (36.7 C)-98.4 F (36.9 C)] 98.4 F (36.9 C) (01/03 0732) Pulse Rate:  [86-95] 89 (01/03 0732) Resp:  [11-20] 19 (01/03 0732) BP: (124-159)/(97-119) 143/99 (01/03 0732) SpO2:  [90 %-100 %] 98 % (01/03 0732) Weight:  [121.9 kg] 121.9 kg (01/03 0442)  Weight change: 1.027 kg Filed Weights   05/24/18 0547 05/25/18 0449 05/26/18 0442  Weight: 123.5 kg 120.9 kg 121.9 kg    Intake/Output: I/O last 3 completed shifts: In: 1840.7 [P.O.:325; I.V.:1515.7] Out: 3975 [Urine:3975]   Intake/Output this shift:  Total I/O In: -  Out: 950 [Urine:950]  Physical Exam: General: NAD,   Head: Normocephalic, atraumatic. Moist oral mucosal membranes  Eyes: Anicteric, PERRL  Neck: Supple, trachea midline  Lungs:  Clear to auscultation  Heart: Regular rate and rhythm  Abdomen:  Soft, nontender,   Extremities:  + peripheral edema.  Neurologic: Nonfocal, moving all four extremities  Skin: No lesions        Basic Metabolic Panel: Recent Labs  Lab 05/22/18 1409 05/23/18 0123 05/24/18 0548 05/25/18 0631 05/26/18 0834  NA 142 142 141 142 142  K 3.2* 3.4* 3.6 3.2* 3.2*  CL 109 110 109 110 108  CO2 23 21* 21* 25 24  GLUCOSE 93 108* 110* 123* 116*  BUN 37* 36* 35* 33* 29*  CREATININE 2.56* 2.43* 2.43* 2.17* 1.99*  CALCIUM 9.0 8.8* 8.9 8.6* 8.7*  PHOS  --   --   --  4.1 4.1    Liver Function Tests: Recent Labs  Lab 05/22/18 1409 05/26/18 0834  AST 25  --   ALT 25  --   ALKPHOS 57  --   BILITOT 2.1*  --   PROT 6.8  --   ALBUMIN 3.7 3.2*   No results for input(s): LIPASE, AMYLASE in the last 168 hours. No results for input(s): AMMONIA in the last 168 hours.  CBC: Recent Labs  Lab 05/22/18 1147 05/22/18 1409 05/23/18 0123  WBC 6.0 6.9 8.2  NEUTROABS 4.1 4.7  --   HGB 12.6* 13.1 12.3*  HCT 39.7 41.3 38.8*   MCV 92.1 92.6 93.3  PLT 215 233 221    Cardiac Enzymes: No results for input(s): CKTOTAL, CKMB, CKMBINDEX, TROPONINI in the last 168 hours.  BNP: Invalid input(s): POCBNP  CBG: Recent Labs  Lab 05/25/18 1315 05/25/18 1633 05/25/18 2119 05/26/18 0734 05/26/18 1129  GLUCAP 104* 136* 131* 105* 138*    Microbiology: No results found for this or any previous visit.  Coagulation Studies: No results for input(s): LABPROT, INR in the last 72 hours.  Urinalysis: No results for input(s): COLORURINE, LABSPEC, PHURINE, GLUCOSEU, HGBUR, BILIRUBINUR, KETONESUR, PROTEINUR, UROBILINOGEN, NITRITE, LEUKOCYTESUR in the last 72 hours.  Invalid input(s): APPERANCEUR    Imaging: No results found.   Medications:   . sodium chloride     . carvedilol  12.5 mg Oral BID WC  . furosemide  40 mg Intravenous Q12H  . heparin  5,000 Units Subcutaneous Q8H  . hydrALAZINE  25 mg Oral Q8H  . insulin aspart  0-9 Units Subcutaneous TID WC  . multivitamin with minerals  1 tablet Oral Daily  . protein supplement shake  11 oz Oral BID BM  . sodium chloride flush  3 mL Intravenous Q12H   sodium chloride, acetaminophen, acetaminophen, labetalol, ondansetron (ZOFRAN)  IV, ondansetron (ZOFRAN) IV, sodium chloride flush  Assessment/ Plan:  Mr. UTAH DELAUDER is a 33 y.o. black male with Mr. IRELAND CHAGNON is a 33 y.o. black male with diabetes mellitus type I, diabetic retinopathy, who was admitted to [facility] on 05/22/2018 for edema.   1. Acute renal failure with proteinuria and hematuria: no known renal baseline. Creatinine of 2.56 on admission.  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  Nonnephrotic range proteinuria.  Serologic work up negative  2. Hypertension: new diagnosis. Elevated diastolic this morning.  Furosemide and carvedilol started on admission.  Echocardiogram reviewed with patient - Start losartan  today  3. Hypokalemia - PO potassium chloride - monitor on losartan  4. 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  Insulin Dependent.    LOS: 4 Jex Strausbaugh 1/3/202011:31 AM

## 2018-05-27 LAB — BASIC METABOLIC PANEL
ANION GAP: 7 (ref 5–15)
BUN: 28 mg/dL — ABNORMAL HIGH (ref 6–20)
CALCIUM: 8.5 mg/dL — AB (ref 8.9–10.3)
CO2: 26 mmol/L (ref 22–32)
Chloride: 108 mmol/L (ref 98–111)
Creatinine, Ser: 2.06 mg/dL — ABNORMAL HIGH (ref 0.61–1.24)
GFR calc Af Amer: 47 mL/min — ABNORMAL LOW (ref >60)
GFR, EST NON AFRICAN AMERICAN: 41 mL/min — AB (ref >60)
Glucose, Bld: 110 mg/dL — ABNORMAL HIGH (ref 70–99)
Potassium: 3.2 mmol/L — ABNORMAL LOW (ref 3.5–5.1)
Sodium: 141 mmol/L (ref 135–145)

## 2018-05-27 LAB — GLUCOSE, CAPILLARY: Glucose-Capillary: 104 mg/dL — ABNORMAL HIGH (ref 70–99)

## 2018-05-27 MED ORDER — LOSARTAN POTASSIUM 25 MG PO TABS: 25.0000 mg | ORAL_TABLET | Freq: Every day | ORAL | 1 refills | Status: DC

## 2018-05-27 MED ORDER — POTASSIUM CHLORIDE ER 20 MEQ PO TBCR: 20.0000 meq | EXTENDED_RELEASE_TABLET | Freq: Every day | ORAL | 1 refills | Status: DC

## 2018-05-27 MED ORDER — FUROSEMIDE 40 MG PO TABS: 40.0000 mg | ORAL_TABLET | Freq: Two times a day (BID) | ORAL | 1 refills | Status: DC

## 2018-05-27 MED ORDER — CARVEDILOL 12.5 MG PO TABS: 12.5000 mg | ORAL_TABLET | Freq: Two times a day (BID) | ORAL | 1 refills | Status: DC

## 2018-05-27 MED ORDER — HYDRALAZINE HCL 25 MG PO TABS: 25.0000 mg | ORAL_TABLET | Freq: Three times a day (TID) | ORAL | 1 refills | Status: DC

## 2018-05-27 MED ORDER — ISOSORBIDE MONONITRATE ER 30 MG PO TB24: 30.0000 mg | ORAL_TABLET | Freq: Every day | ORAL | 1 refills | Status: DC

## 2018-05-27 NOTE — Progress Notes (Signed)
Patient alert and oriented, vss, no complaints of pain.  D/c telemtry and PIV.  Went over discharge information.  Wife at bedside.  No questions.  Escorted out of hospital via wheelchair by nursing staff.

## 2018-05-27 NOTE — Progress Notes (Signed)
[facility]  SUBJECTIVE: Patient laying in bed, denies chest pain or shortness of breath, reports feeling much better   Vitals:   05/26/18 2013 05/27/18 0445 05/27/18 0445 05/27/18 0801  BP: 114/85 (!) 134/98 (!) 134/98 (!) 133/96  Pulse: 87 89 88 90  Resp: 16 17 17 18   Temp: 98 F (36.7 C) 98.4 F (36.9 C) 98.4 F (36.9 C)   TempSrc: Oral Oral Oral   SpO2: 98% 94% 94% 94%  Weight:  119.3 kg    Height:         Intake/Output Summary (Last 24 hours) at 05/27/2018 1046 Last data filed at 05/27/2018 0400 Gross per 24 hour  Intake -  Output 2550 ml  Net -2550 ml      PHYSICAL EXAM  General: Well developed, well nourished, in no acute distress HEENT:  Normocephalic and atramatic Neck:  No JVD.  Lungs: Clear bilaterally to auscultation and percussion. Heart: HRRR . Normal S1 and S2 without gallops or murmurs.  Abdomen: Bowel sounds are positive, abdomen soft and non-tender  Msk:  Back normal, normal gait. Normal strength and tone for age. Extremities: No clubbing, cyanosis or edema.   Neuro: Alert and oriented X 3. Psych:  Good affect, responds appropriately   LABS: Basic Metabolic Panel: Recent Labs    05/25/18 0631 05/26/18 0834 05/27/18 0424  NA 142 142 141  K 3.2* 3.2* 3.2*  CL 110 108 108  CO2 25 24 26   GLUCOSE 123* 116* 110*  BUN 33* 29* 28*  CREATININE 2.17* 1.99* 2.06*  CALCIUM 8.6* 8.7* 8.5*  PHOS 4.1 4.1  --    Liver Function Tests: Recent Labs    05/26/18 0834  ALBUMIN 3.2*   No results for input(s): LIPASE, AMYLASE in the last 72 hours. CBC: No results for input(s): WBC, NEUTROABS, HGB, HCT, MCV, PLT in the last 72 hours. Cardiac Enzymes: No results for input(s): CKTOTAL, CKMB, CKMBINDEX, TROPONINI in the last 72 hours. BNP: Invalid input(s): POCBNP D-Dimer: No results for input(s): DDIMER in the last 72 hours. Hemoglobin A1C: No results for input(s): HGBA1C in the last 72 hours. Fasting Lipid Panel: No results for input(s): CHOL,  HDL, LDLCALC, TRIG, CHOLHDL, LDLDIRECT in the last 72 hours. Thyroid Function Tests: No results for input(s): TSH, T4TOTAL, T3FREE, THYROIDAB in the last 72 hours.  Invalid input(s): FREET3 Anemia Panel: No results for input(s): VITAMINB12, FOLATE, FERRITIN, TIBC, IRON, RETICCTPCT in the last 72 hours.  No results found.   Echo with EF 20 to 25%  TELEMETRY: Sinus rhythm:  ASSESSMENT AND PLAN:  Active Problems:   Acute CHF (congestive heart failure) ([place])    1.  Acute systolic congestive heart failure, LVEF 20 to 25%, much improved after diuresis 2.  Dilated cardiomyopathy, no evidence for significant coronary artery disease 3.  Acute kidney injury, followed by Dr. Juleen China 4.  Hypertension  Recommendations  1.  Agree with overall current therapy 2.  May discharge home, follow-up with Dr. Nehemiah Massed 3.  Advised patient about the importance of low-sodium, no salt diet   Isaias Cowman, MD, PhD, [facility] 05/27/2018 10:46 AM

## 2018-05-27 NOTE — Progress Notes (Signed)
This RN attempted to show patient Adrian Evans video. Patient refused at this time. States he will watch the videos tomorrow. Will continue to monitor.  [name]

## 2018-05-27 NOTE — Progress Notes (Signed)
[facility]  ROUNDING NOTE   Subjective:   Wife at bedside.   Objective:  Vital signs in last 24 hours:  Temp:  [98 F (36.7 C)-98.4 F (36.9 C)] 98.4 F (36.9 C) (01/04 0445) Pulse Rate:  [87-90] 90 (01/04 0801) Resp:  [16-19] 18 (01/04 0801) BP: (114-134)/(79-98) 133/96 (01/04 0801) SpO2:  [94 %-98 %] 94 % (01/04 0801) Weight:  [119.3 kg] 119.3 kg (01/04 0445)  Weight change: -2.631 kg Filed Weights   05/25/18 0449 05/26/18 0442 05/27/18 0445  Weight: 120.9 kg 121.9 kg 119.3 kg    Intake/Output: I/O last 3 completed shifts: In: -  Out: 5300 [Urine:5300]   Intake/Output this shift:  No intake/output data recorded.  Physical Exam: General: NAD,   Head: Normocephalic, atraumatic. Moist oral mucosal membranes  Eyes: Anicteric, PERRL  Neck: Supple, trachea midline  Lungs:  Clear to auscultation  Heart: Regular rate and rhythm  Abdomen:  Soft, nontender,   Extremities:  + peripheral edema.  Neurologic: Nonfocal, moving all four extremities  Skin: No lesions        Basic Metabolic Panel: Recent Labs  Lab 05/23/18 0123 05/24/18 0548 05/25/18 0631 05/26/18 0834 05/27/18 0424  NA 142 141 142 142 141  K 3.4* 3.6 3.2* 3.2* 3.2*  CL 110 109 110 108 108  CO2 21* 21* 25 24 26   GLUCOSE 108* 110* 123* 116* 110*  BUN 36* 35* 33* 29* 28*  CREATININE 2.43* 2.43* 2.17* 1.99* 2.06*  CALCIUM 8.8* 8.9 8.6* 8.7* 8.5*  PHOS  --   --  4.1 4.1  --     Liver Function Tests: Recent Labs  Lab 05/22/18 1409 05/26/18 0834  AST 25  --   ALT 25  --   ALKPHOS 57  --   BILITOT 2.1*  --   PROT 6.8  --   ALBUMIN 3.7 3.2*   No results for input(s): LIPASE, AMYLASE in the last 168 hours. No results for input(s): AMMONIA in the last 168 hours.  CBC: Recent Labs  Lab 05/22/18 1147 05/22/18 1409 05/23/18 0123  WBC 6.0 6.9 8.2  NEUTROABS 4.1 4.7  --   HGB 12.6* 13.1 12.3*  HCT 39.7 41.3 38.8*  MCV 92.1 92.6 93.3  PLT 215 233 221    Cardiac  Enzymes: No results for input(s): CKTOTAL, CKMB, CKMBINDEX, TROPONINI in the last 168 hours.  BNP: Invalid input(s): POCBNP  CBG: Recent Labs  Lab 05/26/18 0734 05/26/18 1129 05/26/18 1658 05/26/18 2133 05/27/18 0758  GLUCAP 105* 138* 112* 131* 104*    Microbiology: No results found for this or any previous visit.  Coagulation Studies: No results for input(s): LABPROT, INR in the last 72 hours.  Urinalysis: No results for input(s): COLORURINE, LABSPEC, PHURINE, GLUCOSEU, HGBUR, BILIRUBINUR, KETONESUR, PROTEINUR, UROBILINOGEN, NITRITE, LEUKOCYTESUR in the last 72 hours.  Invalid input(s): APPERANCEUR    Imaging: No results found.   Medications:   . sodium chloride     . carvedilol  12.5 mg Oral BID WC  . furosemide  60 mg Intravenous BID  . heparin  5,000 Units Subcutaneous Q8H  . hydrALAZINE  25 mg Oral Q8H  . insulin aspart  0-9 Units Subcutaneous TID WC  . losartan  25 mg Oral Daily  . multivitamin with minerals  1 tablet Oral Daily  . protein supplement shake  11 oz Oral BID BM  . sodium chloride flush  3 mL Intravenous Q12H   sodium chloride, acetaminophen, acetaminophen, labetalol, ondansetron (ZOFRAN) IV, ondansetron (  ZOFRAN) IV, sodium chloride flush  Assessment/ Plan:  [name] is a 33 y.o. black male with [name] is a 33 y.o. black male with diabetes mellitus type I, diabetic retinopathy, who was admitted to [facility] on 05/22/2018 for edema.   1. Acute renal failure with proteinuria and hematuria: no known renal baseline. Creatinine of 2.56 on admission.  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  Nonnephrotic range proteinuria.  Serologic work up negative  2. Hypertension: new diagnosis. Elevated diastolic this morning. New onset systolic congestive heart failure  Started on furosemide, losartan, carvedilol, hydralazine and isosobide mononitrate -  daily weights - low salt diet  3. Hypokalemia - PO potassium chloride - monitor on losartan  4. 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  Insulin Dependent.    LOS: 5 Thanos Cousineau 1/4/202012:19 PM

## 2018-05-27 NOTE — Discharge Summary (Signed)
[facility] NAME: Adrian Evans    MR#:  [id number]  DATE OF BIRTH:  [birth year]  DATE OF ADMISSION:  05/22/2018 ADMITTING PHYSICIAN: Dustin Flock, MD  DATE OF DISCHARGE: 05/27/2018 11:49 AM  PRIMARY CARE PHYSICIAN: Lenard Simmer, MD    ADMISSION DIAGNOSIS:  Peripheral edema [R60.9] Acute renal failure (ARF) (HCC) [N17.9] AKI (acute kidney injury) ([place]) [N17.9]  DISCHARGE DIAGNOSIS:  Active Problems:   Acute CHF (congestive heart failure) ([place])   SECONDARY DIAGNOSIS:   Past Medical History:  Diagnosis Date  . Diabetes mellitus without complication [facility])     HOSPITAL COURSE:   33 year old male with past medical history of diabetes who presents to the hospital due to worsening lower extremity edema and shortness of breath.  1.  CHF-this was the cause of patient's worsening lower extremity edema and shortness of breath.   - this was Acute systolic dysfunction.  Patient's echocardiogram showed severe LV dysfunction with EF of 20 to 25%. -Patient was diuresed with IV Lasix and has clinically improved.  He is about 10 L negative since admission.  His lower extremity edema has improved. -He is being discharged on oral Lasix, carvedilol, hydralazine, Imdur and losartan.  He will follow-up with the heart failure clinic and also with cardiology in the next 1 to 2 weeks.  2.  Acute kidney injury with proteinuria-suspected to be secondary to underlying diabetic nephropathy/cardiorenal hemodynamics.  Patient's renal ultrasound was negative for acute obstruction.  -We did not have a baseline creatinine to compare with.  Patient presented to the hospital with a creatinine of 2.5 and is not being discharged with a creatinine around 2.1.  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  Patient's renal function slightly improved with diuresis but he remains in stage III CKD. -he will follow-up with nephrology as an  outpatient.  3.  Cardiomyopathy-patient noted to have a significant cardiomyopathy EF of 20 to 25%.  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  Patient's heart failure meds were optimized.  He will follow-up at the heart failure clinic and also with cardiology for further management of his cardiomyopathy.  4.  Hypokalemia-secondary to diuresis. -Improved and resolved with supplementation.  5.  Diabetes type 2 without complication-  in the hospital patient was on sliding scale insulin, but will resume his NovoLog pen and Tresiba at home.   6.  Essential hypertension- he was on no meds prior to coming to the hospital.  He was discharged on Coreg, losartan, Imdur, hydralazine.  Further changes to his meds can be done as an outpatient.  DISCHARGE CONDITIONS:   Stable  CONSULTS OBTAINED:  Treatment Team:  Corey Skains, MD Lavonia Dana, MD Isaias Cowman, MD  DRUG ALLERGIES:  No Known Allergies  DISCHARGE MEDICATIONS:   Allergies as of 05/27/2018   No Known Allergies     Medication List    TAKE these medications   carvedilol 12.5 MG tablet Commonly known as:  COREG Take 1 tablet (12.5 mg total) by mouth 2 (two) times daily with a meal. Notes to patient:  This medication helps your heart to pump more regularly   furosemide 40 MG tablet Commonly known as:  LASIX Take 1 tablet (40 mg total) by mouth 2 (two) times daily. Notes to patient:  This medication helps get rid of fluid.     hydrALAZINE 25 MG tablet Commonly known as:  APRESOLINE Take 1 tablet (25 mg total) by mouth every 8 (eight) hours. Notes to patient:  Take with meals.  This medicine is used to treat high blood pressure   insulin aspart 100 UNIT/ML injection Commonly known as:  novoLOG Inject into the skin 3 (three) times daily before meals. Sliding scale   isosorbide mononitrate 30 MG 24  hr tablet Commonly known as:  IMDUR Take 1 tablet (30 mg total) by mouth daily. Notes to patient:  This medicine is used to prevent chest pain caused by angina   losartan 25 MG tablet Commonly known as:  COZAAR Take 1 tablet (25 mg total) by mouth daily. Notes to patient:  This medication is used to treat high blood pressure and to reduce the risk of stroke in certain patients. This drug also slows the progression of kidney disease in patients with diabetes   Potassium Chloride ER 20 MEQ Tbcr Take 20 mEq by mouth daily.   TRESIBA FLEXTOUCH 100 UNIT/ML Sopn FlexTouch Pen Generic drug:  insulin degludec Inject 10 Units into the skin at bedtime.         DISCHARGE INSTRUCTIONS:   DIET:  Cardiac diet and Diabetic diet  DISCHARGE CONDITION:  Stable  ACTIVITY:  Activity as tolerated  OXYGEN:  Home Oxygen: No.   Oxygen Delivery: room air  DISCHARGE LOCATION:  home   If you experience worsening of your admission symptoms, develop shortness of breath, life threatening emergency, suicidal or homicidal thoughts you must seek medical attention immediately by calling 911 or calling your MD immediately  if symptoms less severe.  You Must read complete instructions/literature along with all the possible adverse reactions/side effects for all the Medicines you take and that have been prescribed to you. Take any new Medicines after you have completely understood and accpet all the possible adverse reactions/side effects.   Please note  You were cared for by a hospitalist during your hospital stay. If you have any questions about your discharge medications or the care you received while you were in the hospital after you are discharged, you can call the unit and asked to speak with the hospitalist on call if the hospitalist that took care of you is not available. Once you are discharged, your primary care physician will handle any further medical issues. Please note that NO REFILLS for  any discharge medications will be authorized once you are discharged, as it is imperative that you return to your primary care physician (or establish a relationship with a primary care physician if you do not have one) for your aftercare needs so that they can reassess your need for medications and monitor your lab values.     Today   No acute events overnight, lower extremity edema has improved.  Responding well to IV Lasix.  Is about 10 L negative since admission.  Ambulated and is not hypoxic.  Will discharge home on oral diuretics and outpatient follow-up.  VITAL SIGNS:  Blood pressure (!) 133/96, pulse 90, temperature 98.4 F (36.9 C), temperature source Oral, resp. rate 18, height 6' (1.829 m), weight 119.3 kg, SpO2 94 %.  I/O:    Intake/Output Summary (Last 24 hours) at 05/27/2018 1623 Last data filed at 05/27/2018 0400 Gross per 24 hour  Intake -  Output 1825 ml  Net -1825 ml    PHYSICAL EXAMINATION:   GENERAL:  33 y.o.-year-old patient lying in bed in no acute distress.  EYES: Pupils equal, round, reactive to light and accommodation. No scleral icterus.  Extraocular muscles intact.  HEENT: Head atraumatic, normocephalic. Oropharynx and nasopharynx clear.  NECK:  Supple, no jugular venous distention. No thyroid enlargement, no tenderness.  LUNGS: Normal breath sounds bilaterally, no wheezing, rales, rhonchi. No use of accessory muscles of respiration.  CARDIOVASCULAR: S1, S2 normal. No murmurs, rubs, + s3 gallop.  ABDOMEN: Soft, nontender, nondistended. Bowel sounds present. No organomegaly or mass.  EXTREMITIES: No cyanosis, clubbing, + 1 edema b/l.     NEUROLOGIC: Cranial nerves II through XII are intact. No focal Motor or sensory deficits b/l.   PSYCHIATRIC: The patient is alert and oriented x 3.  SKIN: No obvious rash, lesion, or ulcer.   DATA REVIEW:   CBC Recent Labs  Lab 05/23/18 0123  WBC 8.2  HGB 12.3*  HCT 38.8*  PLT 221    Chemistries  Recent Labs   Lab 05/22/18 1409  05/27/18 0424  NA 142   < > 141  K 3.2*   < > 3.2*  CL 109   < > 108  CO2 23   < > 26  GLUCOSE 93   < > 110*  BUN 37*   < > 28*  CREATININE 2.56*   < > 2.06*  CALCIUM 9.0   < > 8.5*  AST 25  --   --   ALT 25  --   --   ALKPHOS 57  --   --   BILITOT 2.1*  --   --    < > = values in this interval not displayed.    Cardiac Enzymes No results for input(s): TROPONINI in the last 168 hours.  Microbiology Results  No results found for this or any previous visit.  RADIOLOGY:  No results found.    Management plans discussed with the patient, family and they are in agreement.  CODE STATUS:  Code Status History    Date Active Date Inactive Code Status Order ID Comments User Context   05/23/2018 0102 05/27/2018 1455 Full Code [id number]  Dustin Flock, MD Inpatient      TOTAL TIME TAKING CARE OF THIS PATIENT: 40 minutes.    Henreitta Leber M.D on 05/27/2018 at 4:23 PM  Between 7am to 6pm - Pager - [phone]  After 6pm go to [email or web address] - [employer] [facility]  Office  [phone]  CC: Primary care physician; Lenard Simmer, MD

## 2018-06-02 ENCOUNTER — Ambulatory Visit: Payer: Self-pay | Admitting: Family

## 2018-06-02 ENCOUNTER — Telehealth: Payer: Self-pay | Admitting: Family

## 2018-06-02 NOTE — Telephone Encounter (Signed)
Patient did not show for his Heart Failure Clinic appointment on 06/02/2018. Will attempt to reschedule.

## 2018-07-16 NOTE — Progress Notes (Deleted)
[name]  Telephone:([phone] Fax:([phone]) [phone]  ID: Adrian Evans OB: [birth year]  MR#: [id number]  TDS#:[id number]  Patient Care Team: Lenard Simmer, MD as PCP - General (Endocrinology)  CHIEF COMPLAINT: Iron deficiency anemia  INTERVAL HISTORY: Patient is a 33 year old male is noted to have declining hemoglobin and iron stores on recent laboratory work.  He was also noted to have 3+ hematuria on recent urinalysis.  He currently feels well and is asymptomatic.  He has no neurologic complaints.  He denies any recent fevers or illnesses.  He has a good appetite and denies weight loss.  He has no chest pain or shortness of breath.  He denies any nausea, vomiting, constipation, or diarrhea.  He has no melena or hematochezia.  Despite his documented hematuria, he has no urinary complaints.  Patient feels at his baseline offers no specific complaints today.  REVIEW OF SYSTEMS:   Review of Systems  Constitutional: Negative.  Negative for fever, malaise/fatigue and weight loss.  Respiratory: Negative.  Negative for cough, hemoptysis and shortness of breath.   Cardiovascular: Negative.  Negative for chest pain and leg swelling.  Gastrointestinal: Negative.  Negative for abdominal pain, blood in stool and melena.  Genitourinary: Positive for hematuria.  Musculoskeletal: Negative.  Negative for back pain.  Skin: Negative.  Negative for rash.  Neurological: Negative.  Negative for focal weakness, weakness and headaches.  Psychiatric/Behavioral: Negative.  The patient is not nervous/anxious.     As per HPI. Otherwise, a complete review of systems is negative.  PAST MEDICAL HISTORY: Past Medical History:  Diagnosis Date  . Diabetes mellitus without complication ([place])     PAST SURGICAL HISTORY: Past Surgical History:  Procedure Laterality Date  . EYE SURGERY    . RIGHT/LEFT HEART CATH AND CORONARY ANGIOGRAPHY N/A 05/25/2018   Procedure: RIGHT/LEFT HEART CATH  AND CORONARY ANGIOGRAPHY;  Surgeon: Corey Skains, MD;  Location: [facility];  Service: Cardiovascular;  Laterality: N/A;  . TONSILLECTOMY      FAMILY HISTORY: No family history on file.  ADVANCED DIRECTIVES (Y/N):  N  HEALTH MAINTENANCE: Social History   Tobacco Use  . Smoking status: Never Smoker  . Smokeless tobacco: Never Used  Substance Use Topics  . Alcohol use: Not Currently  . Drug use: Not Currently     Colonoscopy:  PAP:  Bone density:  Lipid panel:  No Known Allergies  Current Outpatient Medications  Medication Sig Dispense Refill  . carvedilol (COREG) 12.5 MG tablet Take 1 tablet (12.5 mg total) by mouth 2 (two) times daily with a meal. 60 tablet 1  . furosemide (LASIX) 40 MG tablet Take 1 tablet (40 mg total) by mouth 2 (two) times daily. 60 tablet 1  . hydrALAZINE (APRESOLINE) 25 MG tablet Take 1 tablet (25 mg total) by mouth every 8 (eight) hours. 90 tablet 1  . insulin aspart (NOVOLOG) 100 UNIT/ML injection Inject into the skin 3 (three) times daily before meals. Sliding scale    . insulin degludec (TRESIBA FLEXTOUCH) 100 UNIT/ML SOPN FlexTouch Pen Inject 10 Units into the skin at bedtime.    . isosorbide mononitrate (IMDUR) 30 MG 24 hr tablet Take 1 tablet (30 mg total) by mouth daily. 30 tablet 1  . losartan (COZAAR) 25 MG tablet Take 1 tablet (25 mg total) by mouth daily. 30 tablet 1  . potassium chloride 20 MEQ TBCR Take 20 mEq by mouth daily. 30 tablet 1   No current facility-administered medications for this  visit.     OBJECTIVE: There were no vitals filed for this visit.   There is no height or weight on file to calculate BMI.    ECOG FS:0 - Asymptomatic  General: Well-developed, well-nourished, no acute distress. Eyes: Pink conjunctiva, anicteric sclera. HEENT: Normocephalic, moist mucous membranes, clear oropharnyx. Lungs: Clear to auscultation bilaterally. Heart: Regular rate and rhythm. No rubs, murmurs, or gallops. Abdomen:  Soft, nontender, nondistended. No organomegaly noted, normoactive bowel sounds. Musculoskeletal: No edema, cyanosis, or clubbing. Neuro: Alert, answering all questions appropriately. Cranial nerves grossly intact. Skin: No rashes or petechiae noted. Psych: Normal affect. Lymphatics: No cervical, calvicular, axillary or inguinal LAD.   LAB RESULTS:  Lab Results  Component Value Date   NA 141 05/27/2018   K 3.2 (L) 05/27/2018   CL 108 05/27/2018   CO2 26 05/27/2018   GLUCOSE 110 (H) 05/27/2018   BUN 28 (H) 05/27/2018   CREATININE 2.06 (H) 05/27/2018   CALCIUM 8.5 (L) 05/27/2018   PROT 6.8 05/22/2018   ALBUMIN 3.2 (L) 05/26/2018   AST 25 05/22/2018   ALT 25 05/22/2018   ALKPHOS 57 05/22/2018   BILITOT 2.1 (H) 05/22/2018   GFRNONAA 41 (L) 05/27/2018   GFRAA 47 (L) 05/27/2018    Lab Results  Component Value Date   WBC 8.2 05/23/2018   NEUTROABS 4.7 05/22/2018   HGB 12.3 (L) 05/23/2018   HCT 38.8 (L) 05/23/2018   MCV 93.3 05/23/2018   PLT 221 05/23/2018     STUDIES: No results found.  ASSESSMENT: Iron deficiency anemia  PLAN:   1. Iron deficiency anemia: Possibly secondary to documented hematuria which by report is being worked up by urology.  His most recent hemoglobin is 8.7.  Total iron 29, iron saturation 13%.  B12, folate, SPEP were all within normal limits.  Patient denies having colonoscopy, and likely will require one in the near future.  Will defer to primary care for GI referral.  Return to clinic in 1 to 2 weeks for Feraheme only.  Patient will then return to clinic in 3 months with repeat laboratory work, further evaluation, and additional Feraheme if necessary. 2.  Hematuria: Continue work-up per urology.  I spent a total of 45 minutes face-to-face with the patient of which greater than 50% of the visit was spent in counseling and coordination of care as detailed above.  Patient expressed understanding and was in agreement with this plan. He also  understands that He can call clinic at any time with any questions, concerns, or complaints.   Cancer Staging No matching staging information was found for the patient.  [name], MD   07/16/2018 10:35 PM

## 2018-07-17 ENCOUNTER — Inpatient Hospital Stay: Payer: BC Managed Care – PPO | Admitting: Oncology

## 2018-07-17 ENCOUNTER — Inpatient Hospital Stay: Payer: BC Managed Care – PPO

## 2018-07-17 ENCOUNTER — Inpatient Hospital Stay: Payer: BC Managed Care – PPO | Attending: Oncology

## 2020-06-18 ENCOUNTER — Other Ambulatory Visit: Payer: Self-pay

## 2020-06-18 ENCOUNTER — Inpatient Hospital Stay: Payer: BC Managed Care – PPO

## 2020-06-18 ENCOUNTER — Emergency Department: Payer: BC Managed Care – PPO

## 2020-06-18 ENCOUNTER — Encounter: Payer: Self-pay | Admitting: Emergency Medicine

## 2020-06-18 ENCOUNTER — Inpatient Hospital Stay
Admission: EM | Admit: 2020-06-18 | Discharge: 2020-06-21 | DRG: 682 | Disposition: A | Discharge status: Other Acute Inpatient Hospital | Payer: BC Managed Care – PPO | Attending: Family Medicine | Admitting: Family Medicine

## 2020-06-18 DIAGNOSIS — R627 Adult failure to thrive: Secondary | ICD-10-CM | POA: Diagnosis present

## 2020-06-18 DIAGNOSIS — I5022 Chronic systolic (congestive) heart failure: Secondary | ICD-10-CM | POA: Diagnosis present

## 2020-06-18 DIAGNOSIS — J9602 Acute respiratory failure with hypercapnia: Secondary | ICD-10-CM | POA: Diagnosis not present

## 2020-06-18 DIAGNOSIS — Z452 Encounter for adjustment and management of vascular access device: Secondary | ICD-10-CM

## 2020-06-18 DIAGNOSIS — E872 Acidosis: Secondary | ICD-10-CM | POA: Diagnosis present

## 2020-06-18 DIAGNOSIS — Z20822 Contact with and (suspected) exposure to covid-19: Secondary | ICD-10-CM | POA: Diagnosis present

## 2020-06-18 DIAGNOSIS — D72829 Elevated white blood cell count, unspecified: Secondary | ICD-10-CM | POA: Diagnosis present

## 2020-06-18 DIAGNOSIS — E669 Obesity, unspecified: Secondary | ICD-10-CM | POA: Diagnosis present

## 2020-06-18 DIAGNOSIS — R569 Unspecified convulsions: Secondary | ICD-10-CM | POA: Diagnosis not present

## 2020-06-18 DIAGNOSIS — G9340 Encephalopathy, unspecified: Secondary | ICD-10-CM | POA: Diagnosis not present

## 2020-06-18 DIAGNOSIS — D509 Iron deficiency anemia, unspecified: Secondary | ICD-10-CM | POA: Diagnosis present

## 2020-06-18 DIAGNOSIS — I639 Cerebral infarction, unspecified: Secondary | ICD-10-CM | POA: Diagnosis not present

## 2020-06-18 DIAGNOSIS — N189 Chronic kidney disease, unspecified: Secondary | ICD-10-CM | POA: Diagnosis not present

## 2020-06-18 DIAGNOSIS — E861 Hypovolemia: Secondary | ICD-10-CM | POA: Diagnosis present

## 2020-06-18 DIAGNOSIS — I132 Hypertensive heart and chronic kidney disease with heart failure and with stage 5 chronic kidney disease, or end stage renal disease: Secondary | ICD-10-CM | POA: Diagnosis present

## 2020-06-18 DIAGNOSIS — N170 Acute kidney failure with tubular necrosis: Secondary | ICD-10-CM | POA: Diagnosis not present

## 2020-06-18 DIAGNOSIS — J9601 Acute respiratory failure with hypoxia: Secondary | ICD-10-CM | POA: Diagnosis not present

## 2020-06-18 DIAGNOSIS — Z79899 Other long term (current) drug therapy: Secondary | ICD-10-CM

## 2020-06-18 DIAGNOSIS — Z8249 Family history of ischemic heart disease and other diseases of the circulatory system: Secondary | ICD-10-CM

## 2020-06-18 DIAGNOSIS — E10319 Type 1 diabetes mellitus with unspecified diabetic retinopathy without macular edema: Secondary | ICD-10-CM | POA: Diagnosis present

## 2020-06-18 DIAGNOSIS — N2581 Secondary hyperparathyroidism of renal origin: Secondary | ICD-10-CM | POA: Diagnosis present

## 2020-06-18 DIAGNOSIS — I959 Hypotension, unspecified: Secondary | ICD-10-CM | POA: Diagnosis present

## 2020-06-18 DIAGNOSIS — I340 Nonrheumatic mitral (valve) insufficiency: Secondary | ICD-10-CM | POA: Diagnosis not present

## 2020-06-18 DIAGNOSIS — E1022 Type 1 diabetes mellitus with diabetic chronic kidney disease: Secondary | ICD-10-CM | POA: Diagnosis present

## 2020-06-18 DIAGNOSIS — N179 Acute kidney failure, unspecified: Principal | ICD-10-CM | POA: Diagnosis present

## 2020-06-18 DIAGNOSIS — N19 Unspecified kidney failure: Secondary | ICD-10-CM | POA: Diagnosis not present

## 2020-06-18 DIAGNOSIS — I5023 Acute on chronic systolic (congestive) heart failure: Secondary | ICD-10-CM | POA: Diagnosis not present

## 2020-06-18 DIAGNOSIS — E871 Hypo-osmolality and hyponatremia: Secondary | ICD-10-CM | POA: Diagnosis present

## 2020-06-18 DIAGNOSIS — I429 Cardiomyopathy, unspecified: Secondary | ICD-10-CM | POA: Diagnosis present

## 2020-06-18 DIAGNOSIS — I313 Pericardial effusion (noninflammatory): Secondary | ICD-10-CM | POA: Diagnosis present

## 2020-06-18 DIAGNOSIS — N186 End stage renal disease: Secondary | ICD-10-CM | POA: Diagnosis present

## 2020-06-18 DIAGNOSIS — G928 Other toxic encephalopathy: Secondary | ICD-10-CM | POA: Diagnosis present

## 2020-06-18 DIAGNOSIS — R160 Hepatomegaly, not elsewhere classified: Secondary | ICD-10-CM | POA: Diagnosis present

## 2020-06-18 DIAGNOSIS — I130 Hypertensive heart and chronic kidney disease with heart failure and stage 1 through stage 4 chronic kidney disease, or unspecified chronic kidney disease: Secondary | ICD-10-CM | POA: Diagnosis not present

## 2020-06-18 DIAGNOSIS — R4182 Altered mental status, unspecified: Secondary | ICD-10-CM

## 2020-06-18 DIAGNOSIS — I509 Heart failure, unspecified: Secondary | ICD-10-CM | POA: Diagnosis not present

## 2020-06-18 DIAGNOSIS — Z6830 Body mass index (BMI) 30.0-30.9, adult: Secondary | ICD-10-CM

## 2020-06-18 DIAGNOSIS — E1122 Type 2 diabetes mellitus with diabetic chronic kidney disease: Secondary | ICD-10-CM | POA: Diagnosis not present

## 2020-06-18 DIAGNOSIS — R579 Shock, unspecified: Secondary | ICD-10-CM | POA: Diagnosis not present

## 2020-06-18 DIAGNOSIS — I5043 Acute on chronic combined systolic (congestive) and diastolic (congestive) heart failure: Secondary | ICD-10-CM | POA: Diagnosis not present

## 2020-06-18 DIAGNOSIS — E785 Hyperlipidemia, unspecified: Secondary | ICD-10-CM | POA: Diagnosis not present

## 2020-06-18 DIAGNOSIS — J810 Acute pulmonary edema: Secondary | ICD-10-CM | POA: Diagnosis not present

## 2020-06-18 DIAGNOSIS — Z794 Long term (current) use of insulin: Secondary | ICD-10-CM | POA: Diagnosis not present

## 2020-06-18 DIAGNOSIS — J690 Pneumonitis due to inhalation of food and vomit: Secondary | ICD-10-CM

## 2020-06-18 DIAGNOSIS — E1069 Type 1 diabetes mellitus with other specified complication: Secondary | ICD-10-CM | POA: Diagnosis not present

## 2020-06-18 HISTORY — DX: Acute kidney failure, unspecified: N17.9

## 2020-06-18 LAB — COMPREHENSIVE METABOLIC PANEL
ALT: 12 U/L (ref 0–44)
AST: 6 U/L — ABNORMAL LOW (ref 15–41)
Albumin: 2.6 g/dL — ABNORMAL LOW (ref 3.5–5.0)
Alkaline Phosphatase: 78 U/L (ref 38–126)
Anion gap: 17 — ABNORMAL HIGH (ref 5–15)
BUN: 125 mg/dL — ABNORMAL HIGH (ref 6–20)
CO2: 8 mmol/L — ABNORMAL LOW (ref 22–32)
Calcium: 7.7 mg/dL — ABNORMAL LOW (ref 8.9–10.3)
Chloride: 107 mmol/L (ref 98–111)
Creatinine, Ser: 16.42 mg/dL — ABNORMAL HIGH (ref 0.61–1.24)
GFR, Estimated: 3 mL/min — ABNORMAL LOW (ref >60)
Glucose, Bld: 167 mg/dL — ABNORMAL HIGH (ref 70–99)
Potassium: 4.5 mmol/L (ref 3.5–5.1)
Sodium: 132 mmol/L — ABNORMAL LOW (ref 135–145)
Total Bilirubin: 1.2 mg/dL (ref 0.3–1.2)
Total Protein: 6.2 g/dL — ABNORMAL LOW (ref 6.5–8.1)

## 2020-06-18 LAB — BRAIN NATRIURETIC PEPTIDE: B Natriuretic Peptide: 980.9 pg/mL — ABNORMAL HIGH (ref 0.0–100.0)

## 2020-06-18 LAB — CBC WITH DIFFERENTIAL/PLATELET
Abs Immature Granulocytes: 0.05 10*3/uL (ref 0.00–0.07)
Basophils Absolute: 0 10*3/uL (ref 0.0–0.1)
Basophils Relative: 0 %
Eosinophils Absolute: 0.1 10*3/uL (ref 0.0–0.5)
Eosinophils Relative: 1 %
HCT: 35.5 % — ABNORMAL LOW (ref 39.0–52.0)
Hemoglobin: 12.1 g/dL — ABNORMAL LOW (ref 13.0–17.0)
Immature Granulocytes: 1 %
Lymphocytes Relative: 10 %
Lymphs Abs: 0.5 10*3/uL — ABNORMAL LOW (ref 0.7–4.0)
MCH: 30.7 pg (ref 26.0–34.0)
MCHC: 34.1 g/dL (ref 30.0–36.0)
MCV: 90.1 fL (ref 80.0–100.0)
Monocytes Absolute: 0.5 10*3/uL (ref 0.1–1.0)
Monocytes Relative: 9 %
Neutro Abs: 4.2 10*3/uL (ref 1.7–7.7)
Neutrophils Relative %: 79 %
Platelets: 222 10*3/uL (ref 150–400)
RBC: 3.94 MIL/uL — ABNORMAL LOW (ref 4.22–5.81)
RDW: 12.9 % (ref 11.5–15.5)
WBC: 5.3 10*3/uL (ref 4.0–10.5)
nRBC: 0 % (ref 0.0–0.2)

## 2020-06-18 LAB — CBC
HCT: 35.5 % — ABNORMAL LOW (ref 39.0–52.0)
Hemoglobin: 12.1 g/dL — ABNORMAL LOW (ref 13.0–17.0)
MCH: 30.7 pg (ref 26.0–34.0)
MCHC: 34.1 g/dL (ref 30.0–36.0)
MCV: 90.1 fL (ref 80.0–100.0)
Platelets: 211 10*3/uL (ref 150–400)
RBC: 3.94 MIL/uL — ABNORMAL LOW (ref 4.22–5.81)
RDW: 12.8 % (ref 11.5–15.5)
WBC: 4.9 10*3/uL (ref 4.0–10.5)
nRBC: 0 % (ref 0.0–0.2)

## 2020-06-18 LAB — TROPONIN I (HIGH SENSITIVITY)
Troponin I (High Sensitivity): 27 ng/L — ABNORMAL HIGH (ref <18)
Troponin I (High Sensitivity): 28 ng/L — ABNORMAL HIGH (ref <18)

## 2020-06-18 LAB — APTT: aPTT: 35 seconds (ref 24–36)

## 2020-06-18 LAB — CREATININE, SERUM
Creatinine, Ser: 15.83 mg/dL — ABNORMAL HIGH (ref 0.61–1.24)
GFR, Estimated: 4 mL/min — ABNORMAL LOW (ref >60)

## 2020-06-18 LAB — LACTIC ACID, PLASMA
Lactic Acid, Venous: 0.9 mmol/L (ref 0.5–1.9)
Lactic Acid, Venous: 0.6 mmol/L (ref 0.5–1.9)

## 2020-06-18 LAB — IRON AND TIBC
Iron: 130 ug/dL (ref 45–182)
Saturation Ratios: 73 % — ABNORMAL HIGH (ref 17.9–39.5)
TIBC: 178 ug/dL — ABNORMAL LOW (ref 250–450)
UIBC: 48 ug/dL

## 2020-06-18 LAB — FOLATE: Folate: 18.8 ng/mL (ref >5.9)

## 2020-06-18 LAB — PROTIME-INR
INR: 1.2 (ref 0.8–1.2)
Prothrombin Time: 15 seconds (ref 11.4–15.2)

## 2020-06-18 LAB — PHOSPHORUS: Phosphorus: 9.3 mg/dL — ABNORMAL HIGH (ref 2.5–4.6)

## 2020-06-18 LAB — SARS CORONAVIRUS 2 BY RT PCR (HOSPITAL ORDER, PERFORMED IN ~~LOC~~ HOSPITAL LAB): SARS Coronavirus 2: NEGATIVE

## 2020-06-18 LAB — CBG MONITORING, ED: Glucose-Capillary: 176 mg/dL — ABNORMAL HIGH (ref 70–99)

## 2020-06-18 LAB — CK: Total CK: 397 U/L (ref 49–397)

## 2020-06-18 LAB — FERRITIN: Ferritin: 796 ng/mL — ABNORMAL HIGH (ref 24–336)

## 2020-06-18 MED ORDER — ACETAMINOPHEN 325 MG PO TABS
650.0000 mg | ORAL_TABLET | Freq: Four times a day (QID) | ORAL | Status: DC | PRN
  Administered 2020-06-19: 650 mg via ORAL
  Filled 2020-06-18: qty 2

## 2020-06-18 MED ORDER — BISACODYL 5 MG PO TBEC: 5.0000 mg | DELAYED_RELEASE_TABLET | Freq: Every day | ORAL | Status: DC | PRN

## 2020-06-18 MED ORDER — ONDANSETRON HCL 4 MG PO TABS: 4.0000 mg | ORAL_TABLET | Freq: Four times a day (QID) | ORAL | Status: DC | PRN

## 2020-06-18 MED ORDER — ACETAMINOPHEN 650 MG RE SUPP: 650.0000 mg | Freq: Four times a day (QID) | RECTAL | Status: DC | PRN

## 2020-06-18 MED ORDER — INSULIN ASPART 100 UNIT/ML ~~LOC~~ SOLN: 0.0000 [IU] | Freq: Every day | SUBCUTANEOUS | Status: DC

## 2020-06-18 MED ORDER — HEPARIN SODIUM (PORCINE) 5000 UNIT/ML IJ SOLN
5000.0000 [IU] | Freq: Three times a day (TID) | INTRAMUSCULAR | Status: DC
  Administered 2020-06-18 – 2020-06-21 (×7): 5000 [IU] via SUBCUTANEOUS
  Filled 2020-06-18 (×6): qty 1

## 2020-06-18 MED ORDER — SODIUM CHLORIDE 0.9 % IV BOLUS
250.0000 mL | Freq: Once | INTRAVENOUS | Status: AC
  Administered 2020-06-18: 250 mL via INTRAVENOUS

## 2020-06-18 MED ORDER — INSULIN ASPART 100 UNIT/ML ~~LOC~~ SOLN
0.0000 [IU] | Freq: Three times a day (TID) | SUBCUTANEOUS | Status: DC
  Administered 2020-06-21: 1 [IU] via SUBCUTANEOUS
  Filled 2020-06-18: qty 1

## 2020-06-18 MED ORDER — HYDROCODONE-ACETAMINOPHEN 5-325 MG PO TABS
1.0000 | ORAL_TABLET | Freq: Four times a day (QID) | ORAL | Status: DC | PRN
  Administered 2020-06-18 – 2020-06-19 (×2): 1 via ORAL
  Filled 2020-06-18: qty 1
  Filled 2020-06-18: qty 2

## 2020-06-18 MED ORDER — LACTATED RINGERS IV BOLUS
1000.0000 mL | Freq: Once | INTRAVENOUS | Status: AC
  Administered 2020-06-18: 1000 mL via INTRAVENOUS

## 2020-06-18 MED ORDER — ONDANSETRON HCL 4 MG/2ML IJ SOLN
4.0000 mg | Freq: Four times a day (QID) | INTRAMUSCULAR | Status: DC | PRN
  Administered 2020-06-18: 4 mg via INTRAVENOUS
  Filled 2020-06-18: qty 2

## 2020-06-18 MED ORDER — SENNOSIDES-DOCUSATE SODIUM 8.6-50 MG PO TABS: 1.0000 | ORAL_TABLET | Freq: Every evening | ORAL | Status: DC | PRN

## 2020-06-18 MED ORDER — TRAZODONE HCL 50 MG PO TABS
50.0000 mg | ORAL_TABLET | Freq: Every evening | ORAL | Status: DC | PRN
  Administered 2020-06-19: 50 mg via ORAL
  Filled 2020-06-18: qty 1

## 2020-06-18 MED ORDER — SODIUM CHLORIDE 0.9 % IV BOLUS
500.0000 mL | Freq: Once | INTRAVENOUS | Status: AC
  Administered 2020-06-18: 500 mL via INTRAVENOUS

## 2020-06-18 NOTE — ED Notes (Signed)
MD notified of continues hypotension. Pt is resting and in no acute distress.  Pt is still wearing his clothes, multiple ;layers and prefers to keep that on, declines changing into a hospital gown

## 2020-06-18 NOTE — ED Notes (Signed)
Pt is resting, wife had been at the bedside and she stepped out.  Call bell on stretcher.

## 2020-06-18 NOTE — ED Notes (Signed)
Urinal given and asked pt to void

## 2020-06-18 NOTE — ED Provider Notes (Signed)
[facility] Emergency Department Provider Note    Event Date/Time   First MD Initiated Contact with Patient 06/18/20 1333     (approximate)  I have reviewed the triage vital signs and the nursing notes.   HISTORY  Chief Complaint Weakness    HPI Adrian Evans is a 35 y.o. male below listed past medical history as well as dextrocardia kidney disease presents to the ER for generalized malaise over the past several days.  Is describing generalized achiness in his muscles.  Denies any chest pain or pressure.  No shortness of breath.  States he did take his blood pressure medications this morning.  No diarrhea has had some nausea with vomiting had some congestion.    Past Medical History:  Diagnosis Date  . Diabetes mellitus without complication ([place])    History reviewed. No pertinent family history. Past Surgical History:  Procedure Laterality Date  . EYE SURGERY    . RIGHT/LEFT HEART CATH AND CORONARY ANGIOGRAPHY N/A 05/25/2018   Procedure: RIGHT/LEFT HEART CATH AND CORONARY ANGIOGRAPHY;  Surgeon: Corey Skains, MD;  Location: [facility];  Service: Cardiovascular;  Laterality: N/A;  . TONSILLECTOMY     Patient Active Problem List   Diagnosis Date Noted  . Acute CHF (congestive heart failure) ([place]) 05/22/2018  . Iron deficiency anemia 04/16/2018      Prior to Admission medications   Medication Sig Start Date End Date Taking? Authorizing Provider  carvedilol (COREG) 12.5 MG tablet Take 1 tablet (12.5 mg total) by mouth 2 (two) times daily with a meal. 05/27/18 07/26/18  Sainani, Belia Heman, MD  furosemide (LASIX) 40 MG tablet Take 1 tablet (40 mg total) by mouth 2 (two) times daily. 05/27/18 07/26/18  Henreitta Leber, MD  hydrALAZINE (APRESOLINE) 25 MG tablet Take 1 tablet (25 mg total) by mouth every 8 (eight) hours. 05/27/18 07/26/18  Henreitta Leber, MD  insulin aspart (NOVOLOG) 100 UNIT/ML injection Inject into the skin 3 (three) times daily  before meals. Sliding scale    [provider]  insulin degludec (TRESIBA FLEXTOUCH) 100 UNIT/ML SOPN FlexTouch Pen Inject 10 Units into the skin at bedtime.    [provider]  isosorbide mononitrate (IMDUR) 30 MG 24 hr tablet Take 1 tablet (30 mg total) by mouth daily. 05/27/18 07/26/18  Henreitta Leber, MD  losartan (COZAAR) 25 MG tablet Take 1 tablet (25 mg total) by mouth daily. 05/27/18 07/26/18  Henreitta Leber, MD  potassium chloride 20 MEQ TBCR Take 20 mEq by mouth daily. 05/27/18 07/26/18  Henreitta Leber, MD    Allergies Patient has no known allergies.    Social History Social History   Tobacco Use  . Smoking status: Never Smoker  . Smokeless tobacco: Never Used  Substance Use Topics  . Alcohol use: Not Currently  . Drug use: Not Currently    Review of Systems Patient denies headaches, rhinorrhea, blurry vision, numbness, shortness of breath, chest pain, edema, cough, abdominal pain, nausea, vomiting, diarrhea, dysuria, fevers, rashes or hallucinations unless otherwise stated above in HPI. ____________________________________________   PHYSICAL EXAM:  VITAL SIGNS: Vitals:   06/18/20 1412 06/18/20 1438  BP: (!) 92/52 94/64  Pulse: 60 64  Resp: 16 13  Temp:    SpO2: 98% 100%    Constitutional: Alert and oriented.  Eyes: Conjunctivae are normal.  Head: Atraumatic. Nose: No congestion/rhinnorhea. Mouth/Throat: Mucous membranes are moist.   Neck: No stridor. Painless ROM.  Cardiovascular: Normal rate,  regular rhythm. Grossly normal heart sounds.  Good peripheral circulation. Respiratory: Normal respiratory effort.  No retractions. Lungs CTAB. Gastrointestinal: Soft and nontender. No distention. No abdominal bruits. No CVA tenderness. Genitourinary:  Musculoskeletal: No lower extremity tenderness nor edema.  No joint effusions. Neurologic:  Normal speech and language. No gross focal neurologic deficits are appreciated. No facial droop Skin:  Skin is  warm, dry and intact. No rash noted. Psychiatric: Mood and affect are normal. Speech and behavior are normal.  ____________________________________________   LABS (all labs ordered are listed, but only abnormal results are displayed)  Results for orders placed or performed during the hospital encounter of 06/18/20 (from the past 24 hour(s))  CBG monitoring, ED     Status: Abnormal   Collection Time: 06/18/20  1:08 PM  Result Value Ref Range   Glucose-Capillary 176 (H) 70 - 99 mg/dL   Comment 1 Notify RN    Comment 2 Document in Chart   Comprehensive metabolic panel     Status: Abnormal   Collection Time: 06/18/20  1:34 PM  Result Value Ref Range   Sodium 132 (L) 135 - 145 mmol/L   Potassium 4.5 3.5 - 5.1 mmol/L   Chloride 107 98 - 111 mmol/L   CO2 8 (L) 22 - 32 mmol/L   Glucose, Bld 167 (H) 70 - 99 mg/dL   BUN 125 (H) 6 - 20 mg/dL   Creatinine, Ser 16.42 (H) 0.61 - 1.24 mg/dL   Calcium 7.7 (L) 8.9 - 10.3 mg/dL   Total Protein 6.2 (L) 6.5 - 8.1 g/dL   Albumin 2.6 (L) 3.5 - 5.0 g/dL   AST 6 (L) 15 - 41 U/L   ALT 12 0 - 44 U/L   Alkaline Phosphatase 78 38 - 126 U/L   Total Bilirubin 1.2 0.3 - 1.2 mg/dL   GFR, Estimated 3 (L) >60 mL/min   Anion gap 17 (H) 5 - 15  CBC WITH DIFFERENTIAL     Status: Abnormal   Collection Time: 06/18/20  1:34 PM  Result Value Ref Range   WBC 5.3 4.0 - 10.5 K/uL   RBC 3.94 (L) 4.22 - 5.81 MIL/uL   Hemoglobin 12.1 (L) 13.0 - 17.0 g/dL   HCT 35.5 (L) 39.0 - 52.0 %   MCV 90.1 80.0 - 100.0 fL   MCH 30.7 26.0 - 34.0 pg   MCHC 34.1 30.0 - 36.0 g/dL   RDW 12.9 11.5 - 15.5 %   Platelets 222 150 - 400 K/uL   nRBC 0.0 0.0 - 0.2 %   Neutrophils Relative % 79 %   Neutro Abs 4.2 1.7 - 7.7 K/uL   Lymphocytes Relative 10 %   Lymphs Abs 0.5 (L) 0.7 - 4.0 K/uL   Monocytes Relative 9 %   Monocytes Absolute 0.5 0.1 - 1.0 K/uL   Eosinophils Relative 1 %   Eosinophils Absolute 0.1 0.0 - 0.5 K/uL   Basophils Relative 0 %   Basophils Absolute 0.0 0.0 - 0.1  K/uL   Immature Granulocytes 1 %   Abs Immature Granulocytes 0.05 0.00 - 0.07 K/uL  Protime-INR     Status: None   Collection Time: 06/18/20  1:34 PM  Result Value Ref Range   Prothrombin Time 15.0 11.4 - 15.2 seconds   INR 1.2 0.8 - 1.2  APTT     Status: None   Collection Time: 06/18/20  1:34 PM  Result Value Ref Range   aPTT 35 24 - 36 seconds  Troponin I (High  Sensitivity)     Status: Abnormal   Collection Time: 06/18/20  1:34 PM  Result Value Ref Range   Troponin I (High Sensitivity) 27 (H) <18 ng/L  Brain natriuretic peptide     Status: Abnormal   Collection Time: 06/18/20  1:34 PM  Result Value Ref Range   B Natriuretic Peptide 980.9 (H) 0.0 - 100.0 pg/mL  Lactic acid, plasma     Status: None   Collection Time: 06/18/20  1:39 PM  Result Value Ref Range   Lactic Acid, Venous 0.9 0.5 - 1.9 mmol/L   ____________________________________________  EKG My review and personal interpretation at Time: 13:01   Indication: weakness  Rate: 60  Rhythm: sinus Axis: normal Other: poor r wave progression ____________________________________________  RADIOLOGY  I personally reviewed all radiographic images ordered to evaluate for the above acute complaints and reviewed radiology reports and findings.  These findings were personally discussed with the patient.  Please see medical record for radiology report.  ____________________________________________   PROCEDURES  Procedure(s) performed:  .Critical Care Performed by: Merlyn Lot, MD Authorized by: Merlyn Lot, MD   Critical care provider statement:    Critical care time (minutes):  35   Critical care was necessary to treat or prevent imminent or life-threatening deterioration of the following conditions:  Renal failure   Critical care was time spent personally by me on the following activities:  Discussions with consultants, evaluation of patient's response to treatment, examination of patient, ordering and  performing treatments and interventions, ordering and review of laboratory studies, ordering and review of radiographic studies, pulse oximetry, re-evaluation of patient's condition, obtaining history from patient or surrogate and review of old charts      Critical Care performed: yes ____________________________________________   INITIAL IMPRESSION / ASSESSMENT AND PLAN / ED COURSE  Pertinent labs & imaging results that were available during my care of the patient were reviewed by me and considered in my medical decision making (see chart for details).   DDX: ARF, electrolyte abnormality, sepsis, dehydration, ACS, CHF, viral illness  Adrian Evans is a 35 y.o. who presents to the ED with presentation as described above.  Patient hypertensive ill-appearing but protecting his airway no hypoxia.  Blood work sent for the but differential IV fluids were initiated for volume resuscitation.  Blood pressure is improving with IV fluids.  Blood work showing evidence of significant acute renal failure.  Noted metabolic acidosis potassium normal.  Mild troponin elevation but likely chronic in the setting of acute renal failure.  Not consistent with ACS.  Does have findings of CHF but no evidence of acute pulmonary edema and he remains without any hypoxia therefore we will continue with IV fluids and reassessment.  Discussed case in consultation with nephrology who recommends renal ultrasound, echocardiogram, continued IV fluids and reassessment.  Will discuss with hospitalist for admission.  Have discussed with the patient and available family all diagnostics and treatments performed thus far and all questions were answered to the best of my ability. The patient demonstrates understanding and agreement with plan.      The patient was evaluated in Emergency Department today for the symptoms described in the history of present illness. He/she was evaluated in the context of the global COVID-19 pandemic,  which necessitated consideration that the patient might be at risk for infection with the SARS-CoV-2 virus that causes COVID-19. Institutional protocols and algorithms that pertain to the evaluation of patients at risk for COVID-19 are in a state of rapid change  based on information released by regulatory bodies including the CDC and federal and state organizations. These policies and algorithms were followed during the patient's care in the ED.  As part of my medical decision making, I reviewed the following data within the [facility] notes reviewed and incorporated, Labs reviewed, notes from prior ED visits and [place] Controlled Substance Database   ____________________________________________   FINAL CLINICAL IMPRESSION(S) / ED DIAGNOSES  Final diagnoses:  Acute renal failure, unspecified acute renal failure type ([place])      NEW MEDICATIONS STARTED DURING THIS VISIT:  New Prescriptions   No medications on file     Note:  This document was prepared using Dragon voice recognition software and may include unintentional dictation errors.    Merlyn Lot, MD 06/18/20 1524

## 2020-06-18 NOTE — ED Triage Notes (Signed)
Pt to ED via POV with c/o generalized weakness, fatigue and generalized body aches x 3 days. Pt states hx of HTN and DM, has taken BP meds today. In triage Pt noted to be extremely hypotensive, BP rechecked several times on bilateral arms and continues to be hypotensive.

## 2020-06-18 NOTE — Progress Notes (Signed)
[facility]  ROUNDING NOTE   Subjective:   [name] was admitted to [facility] on 06/18/2020 for Acute renal failure (ARF) ([place]) [N17.9]  Patient was evalulated for generalized weakness, myalgias, nausea, vomiting and poor PO intake. He also has rhinorrhea and cough. Patient thought he may have had COVID which was negative.   Patient has been complaint with his medications and denies having peripheral edema at baseline.   Patient was last seen by nephrology 02/26/2020. Creatinine was 3.22, GFR of 27. 3.5 grams of proteinuria.     Objective:  Vital signs in last 24 hours:  Temp:  [97.6 F (36.4 C)] 97.6 F (36.4 C) (01/26 1302) Pulse Rate:  [58-68] 62 (01/26 1600) Resp:  [11-16] 11 (01/26 1600) BP: (67-94)/(50-64) 83/52 (01/26 1600) SpO2:  [97 %-100 %] 100 % (01/26 1600) Weight:  [101.6 kg-103 kg] 103 kg (01/26 1526)  Weight change:  Filed Weights   06/18/20 1301 06/18/20 1526  Weight: 101.6 kg 103 kg    Intake/Output: No intake/output data recorded.   Intake/Output this shift:  Total I/O In: 1000 [IV Piggyback:1000] Out: -   Physical Exam: General: NAD, laying in bed  Head: Normocephalic, atraumatic. Moist oral mucosal membranes  Eyes: Anicteric, PERRL  Neck: Supple, trachea midline  Lungs:  Clear to auscultation  Heart: Regular rate and rhythm  Abdomen:  Soft, nontender,   Extremities:  no peripheral edema.  Neurologic: Nonfocal, moving all four extremities  Skin: No lesions  Access: none    Basic Metabolic Panel: Recent Labs  Lab 06/18/20 1334 06/18/20 1616  NA 132*  --   K 4.5  --   CL 107  --   CO2 8*  --   GLUCOSE 167*  --   BUN 125*  --   CREATININE 16.42* 15.83*  CALCIUM 7.7*  --     Liver Function Tests: Recent Labs  Lab 06/18/20 1334  AST 6*  ALT 12  ALKPHOS 78  BILITOT 1.2  PROT 6.2*  ALBUMIN 2.6*   No results for input(s): LIPASE, AMYLASE in the last 168 hours. No results for input(s): AMMONIA in the last  168 hours.  CBC: Recent Labs  Lab 06/18/20 1334 06/18/20 1616  WBC 5.3 4.9  NEUTROABS 4.2  --   HGB 12.1* 12.1*  HCT 35.5* 35.5*  MCV 90.1 90.1  PLT 222 211    Cardiac Enzymes: Recent Labs  Lab 06/18/20 1508  CKTOTAL 397    BNP: Invalid input(s): POCBNP  CBG: Recent Labs  Lab 06/18/20 [place]*    Microbiology: Results for orders placed or performed during the hospital encounter of 06/18/20  SARS Coronavirus 2 by RT PCR (hospital order, performed in [facility] hospital lab) Nasopharyngeal Nasopharyngeal Swab     Status: None   Collection Time: 06/18/20  3:08 PM   Specimen: Nasopharyngeal Swab  Result Value Ref Range Status   SARS Coronavirus 2 NEGATIVE NEGATIVE Final    Comment: (NOTE) SARS-CoV-2 target nucleic acids are NOT DETECTED.  The SARS-CoV-2 RNA is generally detectable in upper and lower respiratory specimens during the acute phase of infection. The lowest concentration of SARS-CoV-2 viral copies this assay can detect is 250 copies / mL. A negative result does not preclude SARS-CoV-2 infection and should not be used as the sole basis for treatment or other patient management decisions.  A negative result may occur with improper specimen collection / handling, submission of specimen other than nasopharyngeal swab, presence of viral  mutation(s) within the areas targeted by this assay, and inadequate number of viral copies (<250 copies / mL). A negative result must be combined with clinical observations, patient history, and epidemiological information.  Fact Sheet for Patients:   StrictlyIdeas.no  Fact Sheet for Healthcare Providers: BankingDealers.co.za  This test is not yet approved or  cleared by the [place] FDA and has been authorized for detection and/or diagnosis of SARS-CoV-2 by FDA under an Emergency Use Authorization (EUA).  This EUA will remain in effect (meaning this test can  be used) for the duration of the COVID-19 declaration under Section 564(b)(1) of the Act, 21 U.S.C. section 360bbb-3(b)(1), unless the authorization is terminated or revoked sooner.  Performed at [facility], [address] [place], [place] [address]     Coagulation Studies: Recent Labs    06/18/20 1334  LABPROT 15.0  INR 1.2    Urinalysis: No results for input(s): COLORURINE, LABSPEC, PHURINE, GLUCOSEU, HGBUR, BILIRUBINUR, KETONESUR, PROTEINUR, UROBILINOGEN, NITRITE, LEUKOCYTESUR in the last 72 hours.  Invalid input(s): APPERANCEUR    Imaging: DG Chest Port 1 View  Result Date: 06/18/2020 CLINICAL DATA:  Questionable sepsis. EXAM: PORTABLE CHEST 1 VIEW COMPARISON:  05/22/2018. FINDINGS: Severe cardiomegaly again noted. Pulmonary venous congestion. Mild bilateral interstitial prominence. CHF cannot be excluded. Pneumonitis cannot be excluded. No pleural effusion or pneumothorax. Mild thoracic spine scoliosis. No acute bony abnormality. IMPRESSION: Severe cardiomegaly again noted. Pulmonary venous congestion. Mild bilateral interstitial prominence. Mild CHF may be present. Pneumonitis cannot be excluded. Electronically Signed   By: [place]   On: 06/18/2020 14:17     Medications:    . heparin  5,000 Units Subcutaneous Q8H  . insulin aspart  0-5 Units Subcutaneous QHS  . insulin aspart  0-9 Units Subcutaneous TID WC   acetaminophen **OR** acetaminophen, bisacodyl, HYDROcodone-acetaminophen, ondansetron **OR** ondansetron (ZOFRAN) IV, senna-docusate, traZODone  Assessment/ Plan:  [name] is a 35 y.o. black male with diabetes mellitus type I, diabetic retinopathy, hypertension, systolic congestive heart failure who is admitted to [facility] on 06/18/2020 for Acute renal failure (ARF) ([place]) [N17.9]  1. Acute kidney injury on chronic kidney disease stage IIIB versus progression of kidney disease to end stage renal disease History of nephrotic range  proteinuria. Negative serologic work up in 04/2018.  - pending renal ultrasound - Check urine studies - Acute kidney injury panel including vasculitis serologies.  - Discussed dialysis with patient.  - Currently with IV fluids  2. Hypotension: with history of difficult to control hypertension. Also with chronic systolic congestive heart failure.  - pending echocardiogram  3. Metabolic acidosis: secondary to renal insufficiency.   4. Anemia with kidney failure: hemoglobin 12.1 - SPEP/UPEP - iron studies  5. Diabetes mellitus type I with chronic kidney disease: hemoglobin A1c pending. Due to patient's history of diabetic retinopathy, highly suggestive of diabetic nephropathy.    LOS: 0 Haze Antillon 1/26/20225:09 PM

## 2020-06-18 NOTE — H&P (Signed)
History and Physical    ETHER PROVENCIO [id number] DOB: 1986-04-01 DOA: 06/18/2020  PCP: Lenard Simmer, MD  Patient coming from: Home  I have personally briefly reviewed patient's old medical records in [place]  Chief Complaint: Generalized weakness  HPI: Adrian Evans is a 35 y.o. male with medical history significant of type 1 diabetes, HFrEF with EF 20 to 25% on echo in 2019, CKD stage IIIb followed by nephrology, hypertension who presented to the ED from home with several days of generalized weakness and malaise.  He also reports diffuse myalgias, nausea vomiting with inability to keep down oral intake, nasal congestion with sneezing and some cough.  He has been compliant with home medications including his diuretic and blood pressure medications.  Typically has lower extremity edema which he currently does not.  Denies fevers chills, dysuria, chest pain, shortness of breath.  Unclear if any sick contact but patient is a Education officer, museum at high school.  ED Course: Hypotensive with BP 67/50, afebrile, normal heart and respiratory rates.  Labs notable for mild stable anemia, acute renal failure (creatinine 16.42), secondary anion gap metabolic acidosis, mild hyponatremia, BUN 125, BNP 980, troponin 27, normal lactic acid.  Chest x-ray showed severe cardiomegaly, pulmonary venous congestion, mild bilateral interstitial prominence (mild CHF versus pneumonitis cannot be excluded).  Covid test is pending.  ED physician spoke with nephrology who recommended renal ultrasound, echo and agreed with IV hydration.  Patient admitted to hospitalist service for further evaluation management with nephrology following.  Review of Systems: As per HPI otherwise 10 point review of systems negative.    Past Medical History:  Diagnosis Date  . Diabetes mellitus without complication [facility])     Past Surgical History:  Procedure Laterality Date  . EYE SURGERY    . RIGHT/LEFT HEART CATH AND  CORONARY ANGIOGRAPHY N/A 05/25/2018   Procedure: RIGHT/LEFT HEART CATH AND CORONARY ANGIOGRAPHY;  Surgeon: Corey Skains, MD;  Location: [place] [facility];  Service: Cardiovascular;  Laterality: N/A;  . TONSILLECTOMY       reports that he has never smoked. He has never used smokeless tobacco. He reports previous alcohol use. He reports previous drug use.  No Known Allergies  Family History  Problem Relation Age of Onset  . Hypertension Mother       Prior to Admission medications   Medication Sig Start Date End Date Taking? Authorizing Provider  carvedilol (COREG) 12.5 MG tablet Take 1 tablet (12.5 mg total) by mouth 2 (two) times daily with a meal. 05/27/18 07/26/18  Sainani, Belia Heman, MD  furosemide (LASIX) 40 MG tablet Take 1 tablet (40 mg total) by mouth 2 (two) times daily. 05/27/18 07/26/18  Henreitta Leber, MD  hydrALAZINE (APRESOLINE) 25 MG tablet Take 1 tablet (25 mg total) by mouth every 8 (eight) hours. 05/27/18 07/26/18  Henreitta Leber, MD  insulin aspart (NOVOLOG) 100 UNIT/ML injection Inject into the skin 3 (three) times daily before meals. Sliding scale    [provider]  insulin degludec (TRESIBA FLEXTOUCH) 100 UNIT/ML SOPN FlexTouch Pen Inject 10 Units into the skin at bedtime.    [provider]  isosorbide mononitrate (IMDUR) 30 MG 24 hr tablet Take 1 tablet (30 mg total) by mouth daily. 05/27/18 07/26/18  Henreitta Leber, MD  losartan (COZAAR) 25 MG tablet Take 1 tablet (25 mg total) by mouth daily. 05/27/18 07/26/18  Henreitta Leber, MD  potassium chloride 20 MEQ TBCR Take 20 mEq by  mouth daily. 05/27/18 07/26/18  Henreitta Leber, MD    Physical Exam: Vitals:   06/18/20 1412 06/18/20 1438 06/18/20 1500 06/18/20 1526  BP: (!) '92/52 94/64 93/60 '$   Pulse: 60 64 63   Resp: '16 13 15   '$ Temp:      TempSrc:      SpO2: 98% 100% 100%   Weight:    103 kg  Height:    6' (1.829 m)     Constitutional: NAD, calm, comfortable, ill-appearing Eyes: EOMI, lids and  conjunctivae normal ENMT: Mucous membranes are moist.  Hearing grossly normal.  Respiratory: CTAB, no wheezing, no crackles. Normal respiratory effort. No accessory muscle use.  On room air. Cardiovascular: RRR, trace lower extremity extremity edema, no murmur rub or gallop heard.  Abdomen: soft, NT, ND, positive bowel sounds Musculoskeletal: no clubbing / cyanosis. No joint deformity upper and lower extremities. Normal muscle tone.  Skin: dry, intact, normal color, normal temperature Neurologic: CN 2-12 grossly intact. Normal speech.  Grossly non-focal exam. Psychiatric: Alert and oriented x 3. Normal mood. Congruent affect.  Normal judgement and insight.   Labs on Admission: I have personally reviewed following labs and imaging studies  CBC: Recent Labs  Lab 06/18/20 1334  WBC 5.3  NEUTROABS 4.2  HGB 12.1*  HCT 35.5*  MCV 90.1  PLT [id number]   Basic Metabolic Panel: Recent Labs  Lab 06/18/20 1334  NA 132*  K 4.5  CL 107  CO2 8*  GLUCOSE 167*  BUN 125*  CREATININE 16.42*  CALCIUM 7.7*   GFR: Estimated Creatinine Clearance: 7.6 mL/min (A) (by C-G formula based on SCr of 16.42 mg/dL (H)). Liver Function Tests: Recent Labs  Lab 06/18/20 1334  AST 6*  ALT 12  ALKPHOS 78  BILITOT 1.2  PROT 6.2*  ALBUMIN 2.6*   No results for input(s): LIPASE, AMYLASE in the last 168 hours. No results for input(s): AMMONIA in the last 168 hours. Coagulation Profile: Recent Labs  Lab 06/18/20 1334  INR 1.2   Cardiac Enzymes: Recent Labs  Lab 06/18/20 1508  CKTOTAL 397   BNP (last 3 results) No results for input(s): PROBNP in the last 8760 hours. HbA1C: No results for input(s): HGBA1C in the last 72 hours. CBG: Recent Labs  Lab 06/18/20 1308  GLUCAP 176*   Lipid Profile: No results for input(s): CHOL, HDL, LDLCALC, TRIG, CHOLHDL, LDLDIRECT in the last 72 hours. Thyroid Function Tests: No results for input(s): TSH, T4TOTAL, FREET4, T3FREE, THYROIDAB in the last 72  hours. Anemia Panel: No results for input(s): VITAMINB12, FOLATE, FERRITIN, TIBC, IRON, RETICCTPCT in the last 72 hours. Urine analysis:    Component Value Date/Time   COLORURINE STRAW (A) 05/22/2018 2258   APPEARANCEUR CLEAR (A) 05/22/2018 2258   LABSPEC 1.006 05/22/2018 2258   PHURINE 5.0 05/22/2018 2258   GLUCOSEU NEGATIVE 05/22/2018 2258   HGBUR LARGE (A) 05/22/2018 2258   BILIRUBINUR NEGATIVE 05/22/2018 2258   KETONESUR NEGATIVE 05/22/2018 2258   PROTEINUR 100 (A) 05/22/2018 2258   NITRITE NEGATIVE 05/22/2018 2258   LEUKOCYTESUR NEGATIVE 05/22/2018 2258    Radiological Exams on Admission: DG Chest Port 1 View  Result Date: 06/18/2020 CLINICAL DATA:  Questionable sepsis. EXAM: PORTABLE CHEST 1 VIEW COMPARISON:  05/22/2018. FINDINGS: Severe cardiomegaly again noted. Pulmonary venous congestion. Mild bilateral interstitial prominence. CHF cannot be excluded. Pneumonitis cannot be excluded. No pleural effusion or pneumothorax. Mild thoracic spine scoliosis. No acute bony abnormality. IMPRESSION: Severe cardiomegaly again noted. Pulmonary venous congestion. Mild bilateral interstitial  prominence. Mild CHF may be present. Pneumonitis cannot be excluded. Electronically Signed   By: Marcello Moores  Register   On: 06/18/2020 14:17    EKG: Independently reviewed.  Normal sinus rhythm, 63 bpm, no ST elevation.  QTc 499.  Assessment/Plan Active Problems:   Acute renal failure (ARF) (HCC)    Acute renal failure superimposed on CKD stage IIIb -present on admission, likely due to hypovolemia with poor p.o. intake, nausea vomiting, and taking usual medications including diuretic and antihypertensive. He has received about 2 L fluid in the ED so far.   --Nephrology is consulted --Follow-up renal ultrasound --Echo pending --Strict I/O's --Cautious IV hydration (low EF), monitor volume status closely --Avoid nephrotoxins and hypotension, maintain MAP over 65 --Renally dose meds as  indicated   Chronic HFrEF - patient currently hypovolemic and hypotensive.  Trace of any lower extremity edema.  BNP elevated at 980.9, chest x-ray with interstitial prominence, but does not clinically appear decompensated at this time.  Patient is followed by Dr. Nehemiah Massed for cardiology. --Hold Lasix, Coreg, hydralazine, losartan due to hypotension --Monitor volume status closely with strict I/O's and daily weights --Follow-up pending echo   Type 1 diabetes -Home regimen appears to be Tresiba 10 units at bedtime, NovoLog 3 units TID WC.  Last A1c in December 2019 was 5.9, well controlled.   --Sliding scale NovoLog for now --Check A1c with morning labs --Titrate insulin as needed for inpatient goal 140-180   Essential hypertension -presented hypotensive.  Antihypertensives are on hold. --Monitor BP and resume meds when indicated   DVT prophylaxis: heparin injection 5,000 Units Start: 06/18/20 2200   Code Status: Full Family Communication: Wife was away from bedside during my encounter, will attempt to call her Disposition Plan: Anticipate discharge home pending improvement in renal function Consults called: Nephrology, Dr. Juleen China  Status is: Inpatient  Remains inpatient appropriate because:Inpatient level of care appropriate due to severity of illness with severe acute renal failure.   Dispo: The patient is from: Home              Anticipated d/c is to: Home              Anticipated d/c date is: 3 days              Patient currently is not medically stable to d/c.   Difficult to place patient No     Ezekiel Slocumb, DO [facility]  06/18/2020, 4:09 PM    If 7PM-7AM, please contact night-coverage. How to contact the [facility] Attending or Consulting provider [place] or covering provider during after hours [place], for this patient?    1. Check the care team in [facility] and look for a) attending/consulting [facility] provider listed and b) the [facility] team listed 2. Log into  [email or web address] and use [place]'s universal password to access. If you do not have the password, please contact the hospital operator. 3. Locate the [facility] provider you are looking for under [facility] and page to a number that you can be directly reached. 4. If you still have difficulty reaching the provider, please page the [facility] (Director on Call) for the Hospitalists listed on amion for assistance.

## 2020-06-18 NOTE — ED Notes (Signed)
Pt has been unable to void, urinal at the bedside.  MD was notified of hypotension by charge RN and new orders for 225m NS bolus were placed.

## 2020-06-18 NOTE — ED Notes (Signed)
Patient assisted to toilet and back to bed. States "I'm just so uncomfortable". This RN offered to cover in blankets, get undressed and into gown, etc. Pt mumbles no before falling asleep.

## 2020-06-18 NOTE — ED Notes (Signed)
This RN unable to find solution for POC COVID swab. Charge RN made aware.

## 2020-06-18 NOTE — ED Notes (Signed)
No reactant available to run Covid Ag. EDP notified and will run PCR

## 2020-06-19 ENCOUNTER — Inpatient Hospital Stay
Admit: 2020-06-19 | Discharge: 2020-06-19 | Disposition: A | Discharge status: Home / Self Care | Payer: BC Managed Care – PPO | Attending: Internal Medicine | Admitting: Internal Medicine

## 2020-06-19 ENCOUNTER — Other Ambulatory Visit (INDEPENDENT_AMBULATORY_CARE_PROVIDER_SITE_OTHER): Payer: Self-pay | Admitting: Vascular Surgery

## 2020-06-19 DIAGNOSIS — N179 Acute kidney failure, unspecified: Secondary | ICD-10-CM | POA: Diagnosis not present

## 2020-06-19 DIAGNOSIS — N189 Chronic kidney disease, unspecified: Secondary | ICD-10-CM

## 2020-06-19 DIAGNOSIS — I509 Heart failure, unspecified: Secondary | ICD-10-CM

## 2020-06-19 DIAGNOSIS — I959 Hypotension, unspecified: Secondary | ICD-10-CM

## 2020-06-19 DIAGNOSIS — E1122 Type 2 diabetes mellitus with diabetic chronic kidney disease: Secondary | ICD-10-CM

## 2020-06-19 DIAGNOSIS — R627 Adult failure to thrive: Secondary | ICD-10-CM

## 2020-06-19 DIAGNOSIS — I130 Hypertensive heart and chronic kidney disease with heart failure and stage 1 through stage 4 chronic kidney disease, or unspecified chronic kidney disease: Secondary | ICD-10-CM

## 2020-06-19 LAB — ECHOCARDIOGRAM COMPLETE
AR max vel: 2.54 cm2
AV Area VTI: 2.67 cm2
AV Area mean vel: 2.46 cm2
AV Mean grad: 3 mmHg
AV Peak grad: 4.9 mmHg
Ao pk vel: 1.11 m/s
Area-P 1/2: 3.24 cm2
Calc EF: 50.5 %
Height: 72 in
MV VTI: 2.08 cm2
S' Lateral: 3.7 cm
Single Plane A2C EF: 46.1 %
Single Plane A4C EF: 56.4 %
Weight: 3632 [oz_av]

## 2020-06-19 LAB — HEPATITIS C ANTIBODY: HCV Ab: NONREACTIVE

## 2020-06-19 LAB — CBG MONITORING, ED
Glucose-Capillary: 105 mg/dL — ABNORMAL HIGH (ref 70–99)
Glucose-Capillary: 95 mg/dL (ref 70–99)
Glucose-Capillary: 100 mg/dL — ABNORMAL HIGH (ref 70–99)
Glucose-Capillary: 111 mg/dL — ABNORMAL HIGH (ref 70–99)

## 2020-06-19 LAB — CBC
HCT: 36.7 % — ABNORMAL LOW (ref 39.0–52.0)
Hemoglobin: 12.6 g/dL — ABNORMAL LOW (ref 13.0–17.0)
MCH: 31.2 pg (ref 26.0–34.0)
MCHC: 34.3 g/dL (ref 30.0–36.0)
MCV: 90.8 fL (ref 80.0–100.0)
Platelets: 240 10*3/uL (ref 150–400)
RBC: 4.04 MIL/uL — ABNORMAL LOW (ref 4.22–5.81)
RDW: 13.1 % (ref 11.5–15.5)
WBC: 6.2 10*3/uL (ref 4.0–10.5)
nRBC: 0 % (ref 0.0–0.2)

## 2020-06-19 LAB — GLUCOSE, CAPILLARY
Glucose-Capillary: 101 mg/dL — ABNORMAL HIGH (ref 70–99)
Glucose-Capillary: 90 mg/dL (ref 70–99)

## 2020-06-19 LAB — BASIC METABOLIC PANEL
Anion gap: 16 — ABNORMAL HIGH (ref 5–15)
BUN: 131 mg/dL — ABNORMAL HIGH (ref 6–20)
CO2: 9 mmol/L — ABNORMAL LOW (ref 22–32)
Calcium: 7.4 mg/dL — ABNORMAL LOW (ref 8.9–10.3)
Chloride: 109 mmol/L (ref 98–111)
Creatinine, Ser: 16.8 mg/dL — ABNORMAL HIGH (ref 0.61–1.24)
GFR, Estimated: 3 mL/min — ABNORMAL LOW (ref >60)
Glucose, Bld: 112 mg/dL — ABNORMAL HIGH (ref 70–99)
Potassium: 4.3 mmol/L (ref 3.5–5.1)
Sodium: 134 mmol/L — ABNORMAL LOW (ref 135–145)

## 2020-06-19 LAB — HEMOGLOBIN A1C
Hgb A1c MFr Bld: 7.4 % — ABNORMAL HIGH (ref 4.8–5.6)
Mean Plasma Glucose: 165.68 mg/dL

## 2020-06-19 LAB — HEPATITIS B CORE ANTIBODY, IGM: Hep B C IgM: NONREACTIVE

## 2020-06-19 LAB — VITAMIN B12: Vitamin B-12: 1471 pg/mL — ABNORMAL HIGH (ref 180–914)

## 2020-06-19 LAB — HIV ANTIBODY (ROUTINE TESTING W REFLEX): HIV Screen 4th Generation wRfx: NONREACTIVE

## 2020-06-19 LAB — MAGNESIUM: Magnesium: 2.3 mg/dL (ref 1.7–2.4)

## 2020-06-19 LAB — HEPATITIS B SURFACE ANTIGEN: Hepatitis B Surface Ag: NONREACTIVE

## 2020-06-19 LAB — HEPATITIS B SURFACE ANTIBODY,QUALITATIVE: Hep B S Ab: REACTIVE — AB

## 2020-06-19 MED ORDER — MIDODRINE HCL 5 MG PO TABS
2.5000 mg | ORAL_TABLET | Freq: Three times a day (TID) | ORAL | Status: DC
  Administered 2020-06-19 (×3): 2.5 mg via ORAL
  Filled 2020-06-19 (×4): qty 0.5

## 2020-06-19 MED ORDER — SODIUM CHLORIDE 0.9 % IV BOLUS
250.0000 mL | Freq: Once | INTRAVENOUS | Status: AC
  Administered 2020-06-19: 250 mL via INTRAVENOUS

## 2020-06-19 NOTE — ED Notes (Signed)
Messaged Dr. Nicole Kindred regarding pt low BP this AM, she acknowledged and is aware.

## 2020-06-19 NOTE — ED Notes (Signed)
Pt given sandwich tray at this time  

## 2020-06-19 NOTE — ED Notes (Signed)
Lab at bedside

## 2020-06-19 NOTE — ED Notes (Signed)
Messaged admitting Arbutus Ped DO regarding pt NPO status

## 2020-06-19 NOTE — Hospital Course (Addendum)
35 y.o. male with medical history significant of type 1 diabetes, HFrEF with EF 20 to 25% on echo in 2019, CKD stage IIIb followed by nephrology, hypertension who presented to the ED on 06/18/20 from home with several days of generalized weakness, malaise, diffuse myalgias, nausea vomiting with inability to keep down oral intake, nasal congestion with sneezing and some cough.  He tested negative for Covid-19.  Evaluation in the ED revealed acute renal failure with creatinine over 16, and hypotension. Admitted to hospitalist service with nephrology consulted.  Renal ultrasound showed medical renal disease, no hydronephrosis.  Echocardiogram showed a pericardial effusion without signs of tamponade.  Cardiology consulted.  Vascular and general surgery consulted for establishment of dialysis access.  Peritoneal dialysis is planned.  1/28: Patient obtunded, hypotensive with a blood pressure of 70s and 80s not responsive to fluids and midodrine.  Transfer to ICU

## 2020-06-19 NOTE — ED Notes (Signed)
Pt to be transported to [facility] by [facility] at this time

## 2020-06-19 NOTE — Consult Note (Signed)
Patient ID: BURR CLEMENTZ, male   DOB: [birth year], 35 y.o.   MRN: [id number]  HPI QUAN FORNEY is a 35 y.o. male seen in consultation at the request of Dr. Juleen China.  He does have a history of diabetes, hypertension and CHF came in to the hospital last night due to generalized weakness and failure to thrive.  He also reports myalgias and some cough.  I have personally reviewed his x-ray showing cardiomegaly but no pneumonia.  He also had a recent echocardiogram showing an improvement in ejection fraction to up to 45%.  Does have nephrotic range proteinuria.  And acidosis.  Laboratory values include a hemoglobin of 12.6 CBC was normal otherwise.  His BMP shows a creatinine of 16.8 BUN of 131, bicarbonate on 9 with an anion gap of 16. He denies any prior abdominal operations. He was found hypotensive and fluids as well as milrinone has been started.  He has also been anuric  HPI  Past Medical History:  Diagnosis Date  . Diabetes mellitus without complication [facility])     Past Surgical History:  Procedure Laterality Date  . EYE SURGERY    . RIGHT/LEFT HEART CATH AND CORONARY ANGIOGRAPHY N/A 05/25/2018   Procedure: RIGHT/LEFT HEART CATH AND CORONARY ANGIOGRAPHY;  Surgeon: Corey Skains, MD;  Location: [place] [facility];  Service: Cardiovascular;  Laterality: N/A;  . TONSILLECTOMY      Family History  Problem Relation Age of Onset  . Hypertension Mother     Social History Social History   Tobacco Use  . Smoking status: Never Smoker  . Smokeless tobacco: Never Used  Substance Use Topics  . Alcohol use: Not Currently  . Drug use: Not Currently    No Known Allergies  Current Facility-Administered Medications  Medication Dose Route Frequency Provider Last Rate Last Admin  . acetaminophen (TYLENOL) tablet 650 mg  650 mg Oral Q6H PRN Nicole Kindred A, DO   650 mg at 06/19/20 1004   Or  . acetaminophen (TYLENOL) suppository 650 mg  650 mg Rectal Q6H PRN Nicole Kindred A, DO       . bisacodyl (DULCOLAX) EC tablet 5 mg  5 mg Oral Daily PRN Nicole Kindred A, DO      . heparin injection 5,000 Units  5,000 Units Subcutaneous Q8H Nicole Kindred A, DO   5,000 Units at 06/19/20 1229  . HYDROcodone-acetaminophen (NORCO/VICODIN) 5-325 MG per tablet 1-2 tablet  1-2 tablet Oral Q6H PRN Nicole Kindred A, DO   1 tablet at 06/18/20 2022  . insulin aspart (novoLOG) injection 0-5 Units  0-5 Units Subcutaneous QHS Nicole Kindred A, DO      . insulin aspart (novoLOG) injection 0-9 Units  0-9 Units Subcutaneous TID WC Nicole Kindred A, DO      . midodrine (PROAMATINE) tablet 2.5 mg  2.5 mg Oral TID WC Nicole Kindred A, DO   2.5 mg at 06/19/20 1229  . ondansetron (ZOFRAN) tablet 4 mg  4 mg Oral Q6H PRN Nicole Kindred A, DO       Or  . ondansetron (ZOFRAN) injection 4 mg  4 mg Intravenous Q6H PRN Nicole Kindred A, DO   4 mg at 06/18/20 2025  . senna-docusate (Senokot-S) tablet 1 tablet  1 tablet Oral QHS PRN Nicole Kindred A, DO      . traZODone (DESYREL) tablet 50 mg  50 mg Oral QHS PRN Nicole Kindred A, DO   50 mg at 06/19/20 [id number]   Current Outpatient Medications  Medication Sig Dispense Refill  . carvedilol (COREG) 12.5 MG tablet Take 12.5 mg by mouth 2 (two) times daily.    . furosemide (LASIX) 40 MG tablet Take 40 mg by mouth 2 (two) times daily.    . hydrALAZINE (APRESOLINE) 25 MG tablet Take 25 mg by mouth 2 (two) times daily.    . insulin degludec (TRESIBA) 100 UNIT/ML FlexTouch Pen Inject 10 Units into the skin at bedtime.    . isosorbide mononitrate (IMDUR) 30 MG 24 hr tablet Take 30 mg by mouth daily.    Marland Kitchen losartan (COZAAR) 100 MG tablet Take 100 mg by mouth daily.       Review of Systems Full ROS  was asked and was negative except for the information on the HPI  Physical Exam Blood pressure (!) 75/44, pulse 61, temperature (!) 97.5 F (36.4 C), temperature source Oral, resp. rate 18, height 6' (1.829 m), weight 103 kg, SpO2 95 %. CONSTITUTIONAL: NAD Somnolent,  arouses easilyfollows commands, seems very tired EYES: Pupils are equal, round,  Sclera are non-icteric. EARS, NOSE, MOUTH AND THROAT: wearing a amsk Hearing is intact to voice. LYMPH NODES:  Lymph nodes in the neck are normal. RESPIRATORY:  Lungs are clear. There is normal respiratory effort, with equal breath sounds bilaterally, and without pathologic use of accessory muscles. CARDIOVASCULAR: Heart is regular without murmurs, gallops, or rubs. GI: The abdomen is soft, nontender, and nondistended. There are no palpable masses. There is no hepatosplenomegaly. There are normal bowel sounds in all quadrants. GU: Rectal deferred.   MUSCULOSKELETAL: Normal muscle strength and tone. No cyanosis or edema.   SKIN: Turgor is good and there are no pathologic skin lesions or ulcers. NEUROLOGIC: Motor and sensation is grossly normal. Cranial nerves are grossly intact. PSYCH:  Oriented to person, place and time. Affect is normal.  Data Reviewed  I have personally reviewed the patient's imaging, laboratory findings and medical records.    Assessment/Plan 35 year old male with significant comorbidities including chronic kidney disease, diabetes and heart failure with ejection fraction of 45%. , failure to thrive and hypotension.  He is in need of dialysis at some point in time and we were asked to see him for urgent PD catheter placement.  I have discussed with the patient and his wife in detail about different modalities of dialysis to include hemodialysis and peritoneal dialysis.  They seem to be very interested in peritoneal dialysis.  I discussed with them about laparoscopic PD catheter placement.  Risk, benefits and possible complications including but not limited to: Bleeding, infection bowel injury, catheter malfunction, chronic pain They understand and wish to proceed.  I do think that he needs to be optimized preoperatively before the catheter is attempted.  I do think that is prudent to wait until  Tuesday for laparoscopic PD placement.  I have discussed with Dr. Juleen China in detail and he agrees with me.  ExTensive counseling provided  Time spent with the patient was 60 minutes, with more than 50% of the time spent in face-to-face education, counseling and care coordination.     Caroleen Hamman, MD FACS General Surgeon 06/19/2020, 1:33 PM

## 2020-06-19 NOTE — ED Notes (Signed)
Pt up to restroom w walker. Had loose BM. Patient states he feels unsteady and like his legs are tight (states have been tight the past 5/6 months). Helped back into bed. Patient laying on left side at this time. Given pillows and blankets to attempt to make patient more comfortable.

## 2020-06-19 NOTE — Progress Notes (Signed)
[facility]  ROUNDING NOTE   Subjective:   Wife at bedside. Patient complains of weakness and fatigue. He complains he was unable to sleep last night.   BMP pending  Echo prelim with pericardial effusion.   Patient is anuric   Objective:  Vital signs in last 24 hours:  Temp:  [97.5 F (36.4 C)-97.6 F (36.4 C)] 97.5 F (36.4 C) (01/26 2245) Pulse Rate:  [58-127] 64 (01/27 0830) Resp:  [10-24] 11 (01/27 0830) BP: (67-103)/(45-91) 80/49 (01/27 0830) SpO2:  [91 %-100 %] 96 % (01/27 0830) Weight:  [101.6 kg-103 kg] 103 kg (01/26 1526)  Weight change:  Filed Weights   06/18/20 1301 06/18/20 1526  Weight: 101.6 kg 103 kg    Intake/Output: I/O last 3 completed shifts: In: 2000 [IV Piggyback:2000] Out: -    Intake/Output this shift:  No intake/output data recorded.  Physical Exam: General: NAD, laying in bed  Head: Normocephalic, atraumatic. Moist oral mucosal membranes  Eyes: Anicteric, PERRL  Neck: Supple, trachea midline  Lungs:  Clear to auscultation  Heart: Regular rate and rhythm  Abdomen:  Soft, nontender,   Extremities:  no peripheral edema.  Neurologic: Nonfocal, moving all four extremities  Skin: No lesions  Access: none    Basic Metabolic Panel: Recent Labs  Lab 06/18/20 1334 06/18/20 1616 06/18/20 2041  NA 132*  --   --   K 4.5  --   --   CL 107  --   --   CO2 8*  --   --   GLUCOSE 167*  --   --   BUN 125*  --   --   CREATININE 16.42* 15.83*  --   CALCIUM 7.7*  --   --   PHOS  --   --  9.3*    Liver Function Tests: Recent Labs  Lab 06/18/20 1334  AST 6*  ALT 12  ALKPHOS 78  BILITOT 1.2  PROT 6.2*  ALBUMIN 2.6*   No results for input(s): LIPASE, AMYLASE in the last 168 hours. No results for input(s): AMMONIA in the last 168 hours.  CBC: Recent Labs  Lab 06/18/20 1334 06/18/20 1616 06/19/20 0835  WBC 5.3 4.9 6.2  NEUTROABS 4.2  --   --   HGB 12.1* 12.1* 12.6*  HCT 35.5* 35.5* 36.7*  MCV 90.1 90.1 90.8  PLT  222 211 240    Cardiac Enzymes: Recent Labs  Lab 06/18/20 1508  CKTOTAL 397    BNP: Invalid input(s): POCBNP  CBG: Recent Labs  Lab 06/18/20 1308 06/18/20 2045 06/19/20 0722  GLUCAP 176* 105* 95    Microbiology: Results for orders placed or performed during the hospital encounter of 06/18/20  Blood culture (routine single)     Status: None (Preliminary result)   Collection Time: 06/18/20  3:08 PM   Specimen: BLOOD  Result Value Ref Range Status   Specimen Description BLOOD BLOOD LEFT ARM  Final   Special Requests   Final    BOTTLES DRAWN AEROBIC AND ANAEROBIC Blood Culture results may not be optimal due to an inadequate volume of blood received in culture bottles   Culture   Final    NO GROWTH < 24 HOURS Performed at [facility], [address] [place], [place] [address]    Report Status PENDING  Incomplete  SARS Coronavirus 2 by RT PCR (hospital order, performed in [place] hospital lab) Nasopharyngeal Nasopharyngeal Swab     Status: None   Collection Time: 06/18/20  3:08 PM   Specimen: Nasopharyngeal Swab  Result Value Ref Range Status   SARS Coronavirus 2 NEGATIVE NEGATIVE Final    Comment: (NOTE) SARS-CoV-2 target nucleic acids are NOT DETECTED.  The SARS-CoV-2 RNA is generally detectable in upper and lower respiratory specimens during the acute phase of infection. The lowest concentration of SARS-CoV-2 viral copies this assay can detect is 250 copies / mL. A negative result does not preclude SARS-CoV-2 infection and should not be used as the sole basis for treatment or other patient management decisions.  A negative result may occur with improper specimen collection / handling, submission of specimen other than nasopharyngeal swab, presence of viral mutation(s) within the areas targeted by this assay, and inadequate number of viral copies (<250 copies / mL). A negative result must be combined with clinical observations, patient history, and  epidemiological information.  Fact Sheet for Patients:   StrictlyIdeas.no  Fact Sheet for Healthcare Providers: BankingDealers.co.za  This test is not yet approved or  cleared by the [place] FDA and has been authorized for detection and/or diagnosis of SARS-CoV-2 by FDA under an Emergency Use Authorization (EUA).  This EUA will remain in effect (meaning this test can be used) for the duration of the COVID-19 declaration under Section 564(b)(1) of the Act, 21 U.S.C. section 360bbb-3(b)(1), unless the authorization is terminated or revoked sooner.  Performed at [facility], [address] [place], [place] [address]     Coagulation Studies: Recent Labs    06/18/20 1334  LABPROT 15.0  INR 1.2    Urinalysis: No results for input(s): COLORURINE, LABSPEC, PHURINE, GLUCOSEU, HGBUR, BILIRUBINUR, KETONESUR, PROTEINUR, UROBILINOGEN, NITRITE, LEUKOCYTESUR in the last 72 hours.  Invalid input(s): APPERANCEUR    Imaging: US Renal  Result Date: 06/18/2020 CLINICAL DATA:  Acute renal failure EXAM: RENAL / URINARY TRACT ULTRASOUND COMPLETE COMPARISON:  05/23/2018 FINDINGS: Right Kidney: Renal measurements: 12.7 x 5.5 x 6.1 cm = volume: 224 mL. Echogenic cortex. No hydronephrosis or mass Left Kidney: Renal measurements: 13.3 x 6.6 x 5.9 cm = volume: 270 mL. Echogenic cortex. No hydronephrosis or mass. Bladder: Appears normal for degree of bladder distention. Other: Echogenic mass within the right hepatic lobe measuring 2.1 x 2 x 1.9 cm, not seen on prior ultrasound IMPRESSION: 1. Echogenic kidneys bilaterally consistent with medical renal disease. No hydronephrosis. 2. 2.1 cm solid echogenic mass in the right hepatic lobe not clearly identified on prior ultrasound, question hemangioma. When the patient is clinically stable and able to follow directions and hold their breath (preferably as an outpatient) further evaluation with  dedicated abdominal MRI should be considered. Electronically Signed   By: Donavan Foil M.D.   On: 06/18/2020 18:06   DG Chest Port 1 View  Result Date: 06/18/2020 CLINICAL DATA:  Questionable sepsis. EXAM: PORTABLE CHEST 1 VIEW COMPARISON:  05/22/2018. FINDINGS: Severe cardiomegaly again noted. Pulmonary venous congestion. Mild bilateral interstitial prominence. CHF cannot be excluded. Pneumonitis cannot be excluded. No pleural effusion or pneumothorax. Mild thoracic spine scoliosis. No acute bony abnormality. IMPRESSION: Severe cardiomegaly again noted. Pulmonary venous congestion. Mild bilateral interstitial prominence. Mild CHF may be present. Pneumonitis cannot be excluded. Electronically Signed   By: Ripley   On: 06/18/2020 14:17     Medications:    . heparin  5,000 Units Subcutaneous Q8H  . insulin aspart  0-5 Units Subcutaneous QHS  . insulin aspart  0-9 Units Subcutaneous TID WC   acetaminophen **OR** acetaminophen, bisacodyl, HYDROcodone-acetaminophen, ondansetron **OR** ondansetron (ZOFRAN) IV, senna-docusate,  traZODone  Assessment/ Plan:  Mr. HAEDEN MARCHAK is a 35 y.o. black male with diabetes mellitus type I, diabetic retinopathy, hypertension, systolic congestive heart failure who is admitted to [facility] on 06/18/2020 for Acute renal failure (ARF) ([place]) [N17.9]  1. Acute kidney injury on chronic kidney disease stage IIIB versus progression of kidney disease to end stage renal disease History of nephrotic range proteinuria. Negative serologic work up in 04/2018.  renal ultrasound reviewed with patient. Consistent with chronic kidney disease.  - Check urine studies - Discussed dialysis with patient. He will need emergent dialysis. Due to patient age and health, recommend crash starting peritoneal dialysis. - Consult general surgery - Discontinue IV fluids  2. Hypotension: with history of difficult to control hypertension. Also with chronic systolic congestive heart  failure.  Secondary to pericardial effusion  3. Metabolic acidosis: secondary to renal insufficiency.   4. Anemia with kidney failure: hemoglobin 12.6. Normocytic. Iron studies are at goal. Pending SPEP/UPEP  5. Diabetes mellitus type I with chronic kidney disease: hemoglobin A1c pending. Due to patient's history of diabetic retinopathy, highly suggestive of diabetic nephropathy.    LOS: 1 Gareth Fitzner 1/27/20229:10 AM

## 2020-06-19 NOTE — ED Notes (Signed)
Patient again reporting "I'm just uncomfortable. My legs are so stiff. How am I supposed to sleep? Can I just go home?" This RN explained AMA process but explained concerns if patient leaves and risks. Patient nods saying "I just won't sleep then. It's fine."

## 2020-06-19 NOTE — Progress Notes (Addendum)
PROGRESS NOTE    Adrian Evans   [id number]  DOB: 1985/09/07  PCP: Lenard Simmer, MD    DOA: 06/18/2020 LOS: 1   Brief Narrative   35 y.o. male with medical history significant of type 1 diabetes, HFrEF with EF 20 to 25% on echo in 2019, CKD stage IIIb followed by nephrology, hypertension who presented to the ED on 06/18/20 from home with several days of generalized weakness, malaise, diffuse myalgias, nausea vomiting with inability to keep down oral intake, nasal congestion with sneezing and some cough.  He tested negative for Covid-19.  Evaluation in the ED revealed acute renal failure with creatinine over 16, and hypotension. Admitted to hospitalist service with nephrology consulted.  Renal ultrasound showed medical renal disease, no hydronephrosis.  Echocardiogram showed a pericardial effusion without signs of tamponade.  Cardiology consulted.  Vascular and general surgery consulted for establishment of dialysis access.  Peritoneal dialysis is planned.     Assessment & Plan   Active Problems:   Acute renal failure (ARF) (HCC)   Acute renal failure superimposed on CKD stage IIIb -present on admission, likely due to hypovolemia with poor p.o. intake, nausea vomiting, and taking usual medications including diuretic and antihypertensive. --Nephrology is consulted --Patient will need urgent dialysis --Vascular surgery consulted for placement of PermCath --General surgery consulted for placement of PD cath --N.p.o. for now for procedure --Strict I/O's --Avoid nephrotoxins and hypotension, maintain MAP over 65 --Minimize fluids as much as possible given pericardial effusion, and patient anuric --Renally dose meds as indicated   Hypotension -present on admission, due to hypovolemia in the setting of nausea vomiting.  Little improvement with IV fluids that were given.  Avoiding fluids as much as possible given pericardial effusion seen on echo. --Started on low-dose  midodrine --Maintain MAP greater than 65   High anion gap metabolic acidosis - due to renal failure.  Mgmt as above.  Monitor labs.   Pericardial effusion -most likely due to uremia.  Cardiology is consulted.  No signs of tamponade on echo. --Monitor hemodynamics --Limit fluids as much possible   Chronic HFrEF - patient currently hypovolemic and hypotensive.  Trace of any lower extremity edema.  BNP elevated at 980.9, chest x-ray with interstitial prominence, but does not clinically appear decompensated at this time.  Patient is followed by Dr. Nehemiah Massed for cardiology. Echo this admission shows EF 40 to 45%, global LV hypokinesis, moderate LVH, small pericardial effusion without evidence of cardiac tamponade. --Hold Lasix, Coreg, hydralazine, losartan due to hypotension --Monitor volume status closely with strict I/O's and daily weights   Type 1 diabetes -Home regimen appears to be Tresiba 10 units at bedtime, NovoLog 3 units TID WC.  Last A1c in December 2019 was 5.9, well controlled.   --Sliding scale NovoLog for now --Check A1c with morning labs --Titrate insulin as needed for inpatient goal 140-180   Essential hypertension -presented hypotensive.  Antihypertensives are on hold. --Monitor BP and resume meds when indicated\   Liver mass - seen on renal ultrasound 1/27.   "2.1 cm solid echogenic mass in the right hepatic lobe not clearly identified on prior ultrasound, question hemangioma. When the patient is clinically stable and able to follow directions and hold their breath (preferably as an outpatient) further evaluation with dedicated abdominal MRI should be considered." --Outpatient MRI, PCP to schedule   Obesity: Body mass index is 30.79 kg/m.  Complicates overall care and prognosis.  DVT prophylaxis: heparin injection 5,000 Units Start: 06/18/20 2200  Diet:  Diet Orders (From admission, onward)    Start     Ordered   06/20/20 0001  Diet NPO time specified   Diet effective midnight        06/19/20 1512   06/19/20 1010  Diet NPO time specified  Diet effective now        06/19/20 1009            Code Status: Full Code    Subjective 06/19/20    Patient seen this morning in the ED holding for a bed.  Wife at bedside.  Patient reports no sleep overnight, hurting everywhere says he just feels tight and achy all over.  Says he just is exhausted.  Echo tech at bedside completing study.   Disposition Plan & Communication   Status is: Inpatient  Remains inpatient appropriate because:Inpatient level of care appropriate due to severity of illness.  Renal failure and ongoing hypotension.  Being started on dialysis.   Dispo: The patient is from: Home              Anticipated d/c is to: Home              Anticipated d/c date is: 3 days              Patient currently is not medically stable to d/c.   Difficult to place patient No   Family Communication: wife at bedside on rounds today, 1/27    Consults, Procedures, Significant Events   Consultants:   Nephrology  Vascular surgery  General surgery  Cardiology  Procedures:   None  Antimicrobials:  Anti-infectives (From admission, onward)   None         Objective   Vitals:   06/19/20 0830 06/19/20 0910 06/19/20 1000 06/19/20 1230  BP: (!) 80/49 (!) 78/48 (!) 80/46 (!) 75/44  Pulse: 64 62 67 61  Resp: '11 11 15 18  '$ Temp:      TempSrc:      SpO2: 96% 96% 96% 95%  Weight:      Height:        Intake/Output Summary (Last 24 hours) at 06/19/2020 1515 Last data filed at 06/19/2020 0326 Gross per 24 hour  Intake 1000 ml  Output --  Net 1000 ml   Filed Weights   06/18/20 1301 06/18/20 1526  Weight: 101.6 kg 103 kg    Physical Exam:  General exam: awake, alert, no acute distress Respiratory system: CTAB, no wheezes, rales or rhonchi, normal respiratory effort. Cardiovascular system: normal S1/S2, RRR, trace LE edema.   Gastrointestinal system: soft, NT, ND Central  nervous system: A&O x3. no gross focal neurologic deficits, normal speech Extremities: moves all, no edema, normal tone Psychiatry: normal mood, congruent affect, judgement and insight appear normal  Labs   Data Reviewed: I have personally reviewed following labs and imaging studies  CBC: Recent Labs  Lab 06/18/20 1334 06/18/20 1616 06/19/20 0835  WBC 5.3 4.9 6.2  NEUTROABS 4.2  --   --   HGB 12.1* 12.1* 12.6*  HCT 35.5* 35.5* 36.7*  MCV 90.1 90.1 90.8  PLT 222 211 [id number]   Basic Metabolic Panel: Recent Labs  Lab 06/18/20 1334 06/18/20 1616 06/18/20 2041 06/19/20 0835  NA 132*  --   --  134*  K 4.5  --   --  4.3  CL 107  --   --  109  CO2 8*  --   --  9*  GLUCOSE 167*  --   --  112*  BUN 125*  --   --  131*  CREATININE 16.42* 15.83*  --  16.80*  CALCIUM 7.7*  --   --  7.4*  MG  --   --   --  2.3  PHOS  --   --  9.3*  --    GFR: Estimated Creatinine Clearance: 7.5 mL/min (A) (by C-G formula based on SCr of 16.8 mg/dL (H)). Liver Function Tests: Recent Labs  Lab 06/18/20 1334  AST 6*  ALT 12  ALKPHOS 78  BILITOT 1.2  PROT 6.2*  ALBUMIN 2.6*   No results for input(s): LIPASE, AMYLASE in the last 168 hours. No results for input(s): AMMONIA in the last 168 hours. Coagulation Profile: Recent Labs  Lab 06/18/20 1334  INR 1.2   Cardiac Enzymes: Recent Labs  Lab 06/18/20 1508  CKTOTAL 397   BNP (last 3 results) No results for input(s): PROBNP in the last 8760 hours. HbA1C: Recent Labs    06/19/20 0835  HGBA1C 7.4*   CBG: Recent Labs  Lab 06/18/20 1308 06/18/20 2045 06/19/20 0722 06/19/20 1200  GLUCAP 176* 105* 95 100*   Lipid Profile: No results for input(s): CHOL, HDL, LDLCALC, TRIG, CHOLHDL, LDLDIRECT in the last 72 hours. Thyroid Function Tests: No results for input(s): TSH, T4TOTAL, FREET4, T3FREE, THYROIDAB in the last 72 hours. Anemia Panel: Recent Labs    06/18/20 2041  VITAMINB12 1,471*  FOLATE 18.8  FERRITIN 796*  TIBC 178*   IRON 130   Sepsis Labs: Recent Labs  Lab 06/18/20 1339 06/18/20 1508  LATICACIDVEN 0.9 0.6    Recent Results (from the past 240 hour(s))  Blood culture (routine single)     Status: None (Preliminary result)   Collection Time: 06/18/20  3:08 PM   Specimen: BLOOD  Result Value Ref Range Status   Specimen Description BLOOD BLOOD LEFT ARM  Final   Special Requests   Final    BOTTLES DRAWN AEROBIC AND ANAEROBIC Blood Culture results may not be optimal due to an inadequate volume of blood received in culture bottles   Culture   Final    NO GROWTH < 24 HOURS Performed at [facility], [address] [place], [place] [address]    Report Status PENDING  Incomplete  SARS Coronavirus 2 by RT PCR (hospital order, performed in [place] hospital lab) Nasopharyngeal Nasopharyngeal Swab     Status: None   Collection Time: 06/18/20  3:08 PM   Specimen: Nasopharyngeal Swab  Result Value Ref Range Status   SARS Coronavirus 2 NEGATIVE NEGATIVE Final    Comment: (NOTE) SARS-CoV-2 target nucleic acids are NOT DETECTED.  The SARS-CoV-2 RNA is generally detectable in upper and lower respiratory specimens during the acute phase of infection. The lowest concentration of SARS-CoV-2 viral copies this assay can detect is 250 copies / mL. A negative result does not preclude SARS-CoV-2 infection and should not be used as the sole basis for treatment or other patient management decisions.  A negative result may occur with improper specimen collection / handling, submission of specimen other than nasopharyngeal swab, presence of viral mutation(s) within the areas targeted by this assay, and inadequate number of viral copies (<250 copies / mL). A negative result must be combined with clinical observations, patient history, and epidemiological information.  Fact Sheet for Patients:   StrictlyIdeas.no  Fact Sheet for Healthcare  Providers: BankingDealers.co.za  This test is not yet approved or  cleared by the [place] FDA and has been authorized  for detection and/or diagnosis of SARS-CoV-2 by FDA under an Emergency Use Authorization (EUA).  This EUA will remain in effect (meaning this test can be used) for the duration of the COVID-19 declaration under Section 564(b)(1) of the Act, 21 U.S.C. section 360bbb-3(b)(1), unless the authorization is terminated or revoked sooner.  Performed at [facility], [address] [place], [place] [address]       Imaging Studies   US Renal  Result Date: Jun 22, 2020 CLINICAL DATA:  Acute renal failure EXAM: RENAL / URINARY TRACT ULTRASOUND COMPLETE COMPARISON:  05/23/2018 FINDINGS: Right Kidney: Renal measurements: 12.7 x 5.5 x 6.1 cm = volume: 224 mL. Echogenic cortex. No hydronephrosis or mass Left Kidney: Renal measurements: 13.3 x 6.6 x 5.9 cm = volume: 270 mL. Echogenic cortex. No hydronephrosis or mass. Bladder: Appears normal for degree of bladder distention. Other: Echogenic mass within the right hepatic lobe measuring 2.1 x 2 x 1.9 cm, not seen on prior ultrasound IMPRESSION: 1. Echogenic kidneys bilaterally consistent with medical renal disease. No hydronephrosis. 2. 2.1 cm solid echogenic mass in the right hepatic lobe not clearly identified on prior ultrasound, question hemangioma. When the patient is clinically stable and able to follow directions and hold their breath (preferably as an outpatient) further evaluation with dedicated abdominal MRI should be considered. Electronically Signed   By: Donavan Foil M.D.   On: 06-22-20 18:06   DG Chest Port 1 View  Result Date: 2020/06/22 CLINICAL DATA:  Questionable sepsis. EXAM: PORTABLE CHEST 1 VIEW COMPARISON:  05/22/2018. FINDINGS: Severe cardiomegaly again noted. Pulmonary venous congestion. Mild bilateral interstitial prominence. CHF cannot be excluded. Pneumonitis cannot be excluded.  No pleural effusion or pneumothorax. Mild thoracic spine scoliosis. No acute bony abnormality. IMPRESSION: Severe cardiomegaly again noted. Pulmonary venous congestion. Mild bilateral interstitial prominence. Mild CHF may be present. Pneumonitis cannot be excluded. Electronically Signed   By: Marcello Moores  Register   On: 06/22/20 14:17   ECHOCARDIOGRAM COMPLETE  Result Date: 06/19/2020    ECHOCARDIOGRAM REPORT   Patient Name:   TERRIANCE PAVESE Date of Exam: 06/19/2020 Medical Rec #:  [id number]        Height:       72.0 in Accession #:    [id number]       Weight:       227.0 lb Date of Birth:  Dec 20, 1985         BSA:          2.248 m Patient Age:    35 years         BP:           80/49 mmHg Patient Gender: M                HR:           64 bpm. Exam Location:  [facility] Procedure: 2D Echo, Color Doppler, Cardiac Doppler and Strain Analysis Indications:     [id number] CHF-Acute Systolic  History:         Patient has prior history of Echocardiogram examinations.                  HFpEF, CKD; Risk Factors:Diabetes and Hypertension.  Sonographer:     Charmayne Sheer RDCS (AE) Referring Phys:  [id number] Claiborne Billings A Raksha Wolfgang Diagnosing Phys: Yolonda Kida MD  Sonographer Comments: Global longitudinal strain was attempted. IMPRESSIONS  1. Left ventricular ejection fraction, by estimation, is 40 to 45%. The left ventricle has mildly decreased function. The left ventricle demonstrates  global hypokinesis. The left ventricular internal cavity size was moderately dilated. There is moderate  concentric left ventricular hypertrophy. Left ventricular diastolic parameters were normal. There is the interventricular septum is flattened in diastole ('D' shaped left ventricle), consistent with right ventricular volume overload.  2. Right ventricular systolic function is moderately reduced. The right ventricular size is moderately enlarged.  3. A small pericardial effusion is present. The pericardial effusion is posterior to the left ventricle and  circumferential. There is no evidence of cardiac tamponade.  4. The mitral valve is grossly normal. Trivial mitral valve regurgitation.  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  6. The aortic valve is grossly normal. Aortic valve regurgitation is not visualized. FINDINGS  Left Ventricle: Left ventricular ejection fraction, by estimation, is 40 to 45%. The left ventricle has mildly decreased function. The left ventricle demonstrates global hypokinesis. The left ventricular internal cavity size was moderately dilated. There is moderate concentric left ventricular hypertrophy. The interventricular septum is flattened in diastole ('D' shaped left ventricle), consistent with right ventricular volume overload. Left ventricular diastolic parameters were normal. Right Ventricle: The right ventricular size is moderately enlarged. No increase in right ventricular wall thickness. Right ventricular systolic function is moderately reduced. Left Atrium: Left atrial size was normal in size. Right Atrium: Right atrial size was normal in size. Pericardium: A small pericardial effusion is present. The pericardial effusion is posterior to the left ventricle and circumferential. There is no evidence of cardiac tamponade. Mitral Valve: The mitral valve is grossly normal. Trivial mitral valve regurgitation. MV peak gradient, 3.8 mmHg. The mean mitral valve gradient is 2.0 mmHg. Tricuspid Valve: The tricuspid valve is myxomatous. Tricuspid valve regurgitation is mild. Aortic Valve: The aortic valve is grossly normal. Aortic valve regurgitation is not visualized. Aortic valve mean gradient measures 3.0 mmHg. Aortic valve peak gradient measures 4.9 mmHg. Aortic valve area, by VTI measures 2.67 cm. Pulmonic Valve: The pulmonic valve was normal in structure. Pulmonic valve regurgitation is not visualized. Aorta: The aortic arch was not well visualized. IAS/Shunts: No atrial level shunt detected by color flow Doppler.  LEFT VENTRICLE PLAX 2D  LVIDd:         6.10 cm      Diastology LVIDs:         3.70 cm      LV e' medial:    7.40 cm/s LV PW:         1.30 cm      LV E/e' medial:  11.5 LV IVS:        1.00 cm      LV e' lateral:   5.66 cm/s LVOT diam:     2.30 cm      LV E/e' lateral: 15.0 LV SV:         55 LV SV Index:   25 LVOT Area:     4.15 cm  LV Volumes (MOD) LV vol d, MOD A2C: 176.0 ml LV vol d, MOD A4C: 162.0 ml LV vol s, MOD A2C: 94.8 ml LV vol s, MOD A4C: 70.6 ml LV SV MOD A2C:     81.2 ml LV SV MOD A4C:     162.0 ml LV SV MOD BP:      85.5 ml RIGHT VENTRICLE RV Basal diam:  4.10 cm RV Mid diam:    5.40 cm LEFT ATRIUM             Index       RIGHT ATRIUM  Index LA diam:        3.70 cm 1.65 cm/m  RA Area:     22.20 cm LA Vol (A2C):   59.0 ml 26.24 ml/m RA Volume:   64.20 ml  28.55 ml/m LA Vol (A4C):   65.5 ml 29.13 ml/m LA Biplane Vol: 64.8 ml 28.82 ml/m  AORTIC VALVE                   PULMONIC VALVE AV Area (Vmax):    2.54 cm    PV Vmax:       1.11 m/s AV Area (Vmean):   2.46 cm    PV Vmean:      75.300 cm/s AV Area (VTI):     2.67 cm    PV VTI:        0.228 m AV Vmax:           111.00 cm/s PV Peak grad:  4.9 mmHg AV Vmean:          77.900 cm/s PV Mean grad:  3.0 mmHg AV VTI:            0.207 m AV Peak Grad:      4.9 mmHg AV Mean Grad:      3.0 mmHg LVOT Vmax:         67.90 cm/s LVOT Vmean:        46.100 cm/s LVOT VTI:          0.133 m LVOT/AV VTI ratio: 0.64  AORTA Ao Root diam: 3.80 cm MITRAL VALVE MV Area (PHT): 3.24 cm    SHUNTS MV Area VTI:   2.08 cm    Systemic VTI:  0.13 m MV Peak grad:  3.8 mmHg    Systemic Diam: 2.30 cm MV Mean grad:  2.0 mmHg MV Vmax:       0.98 m/s MV Vmean:      61.7 cm/s MV Decel Time: 234 msec MV E velocity: 84.80 cm/s MV A velocity: 45.80 cm/s MV E/A ratio:  1.85 Dwayne D Callwood MD Electronically signed by Yolonda Kida MD Signature Date/Time: 06/19/2020/11:26:37 AM    Final      Medications   Scheduled Meds: . heparin  5,000 Units Subcutaneous Q8H  . insulin aspart  0-5 Units  Subcutaneous QHS  . insulin aspart  0-9 Units Subcutaneous TID WC  . midodrine  2.5 mg Oral TID WC   Continuous Infusions:     LOS: 1 day    Time spent: 30 minutes with > 50% spent in coordination of care and at bedside.    Ezekiel Slocumb, DO [facility]  06/19/2020, 3:15 PM    If 7PM-7AM, please contact night-coverage. How to contact the [facility] Attending or Consulting provider [place] or covering provider during after hours [place], for this patient?    1. Check the care team in [facility] and look for a) attending/consulting [facility] provider listed and b) the [facility] team listed 2. Log into [email or web address] and use [place]'s universal password to access. If you do not have the password, please contact the hospital operator. 3. Locate the [facility] provider you are looking for under [facility] and page to a number that you can be directly reached. 4. If you still have difficulty reaching the provider, please page the [facility] (Director on Call) for the Hospitalists listed on amion for assistance.

## 2020-06-19 NOTE — ED Notes (Signed)
Dr. Arbutus Ped updated on patient updated VS after fluid bolus.

## 2020-06-19 NOTE — ED Notes (Signed)
No further orders for maintenance IVF; messaged Dr. Arbutus Ped regarding pt most recent VS.

## 2020-06-19 NOTE — ED Notes (Addendum)
Dr. Juleen China at bedside, orders to hold maintenance fluids, aware of low BP.

## 2020-06-19 NOTE — Progress Notes (Signed)
*  PRELIMINARY RESULTS* Echocardiogram 2D Echocardiogram has been performed.  Wallie Char Emmanuela Ghazi 06/19/2020, 9:48 AM

## 2020-06-19 NOTE — Consult Note (Signed)
[facility] Vascular Consult Note  MRN : [id number]  Adrian Evans is a 35 y.o. (02-10-1986) male who presents with chief complaint of  Chief Complaint  Patient presents with  . Weakness   History of Present Illness:  Adrian Evans is a 35 year old male with medical history significant of type 1 diabetes, HFrEF with EF 20 to 25% on echo in 2019, CKD stage IIIb followed by nephrology, hypertension who presented to the ED from home with several days of generalized weakness and malaise.  He also reports diffuse myalgias, nausea vomiting with inability to keep down oral intake, nasal congestion with sneezing and some cough.  He has been compliant with home medications including his diuretic and blood pressure medications.  Typically has lower extremity edema which he currently does not.  Denies fevers chills, dysuria, chest pain, shortness of breath.    Patient with known history of chronic kidney disease stage III which now seems to progressed to end-stage renal disease requiring dialysis.  Nephrology (Dr. Juleen China) would like to initiate dialysis at this time however the patient does not have an adequate access.  Patient plans on peritoneal dialysis in the future however an attempt to dialyze now we will place a PermCath.  Current Facility-Administered Medications  Medication Dose Route Frequency Provider Last Rate Last Admin  . acetaminophen (TYLENOL) tablet 650 mg  650 mg Oral Q6H PRN Nicole Kindred A, DO   650 mg at 06/19/20 1004   Or  . acetaminophen (TYLENOL) suppository 650 mg  650 mg Rectal Q6H PRN Nicole Kindred A, DO      . bisacodyl (DULCOLAX) EC tablet 5 mg  5 mg Oral Daily PRN Nicole Kindred A, DO      . heparin injection 5,000 Units  5,000 Units Subcutaneous Q8H Nicole Kindred A, DO   5,000 Units at 06/19/20 1229  . HYDROcodone-acetaminophen (NORCO/VICODIN) 5-325 MG per tablet 1-2 tablet  1-2 tablet Oral Q6H PRN Nicole Kindred A, DO   1 tablet at  06/18/20 2022  . insulin aspart (novoLOG) injection 0-5 Units  0-5 Units Subcutaneous QHS Nicole Kindred A, DO      . insulin aspart (novoLOG) injection 0-9 Units  0-9 Units Subcutaneous TID WC Nicole Kindred A, DO      . midodrine (PROAMATINE) tablet 2.5 mg  2.5 mg Oral TID WC Nicole Kindred A, DO   2.5 mg at 06/19/20 1229  . ondansetron (ZOFRAN) tablet 4 mg  4 mg Oral Q6H PRN Nicole Kindred A, DO       Or  . ondansetron (ZOFRAN) injection 4 mg  4 mg Intravenous Q6H PRN Nicole Kindred A, DO   4 mg at 06/18/20 2025  . senna-docusate (Senokot-S) tablet 1 tablet  1 tablet Oral QHS PRN Nicole Kindred A, DO      . traZODone (DESYREL) tablet 50 mg  50 mg Oral QHS PRN Nicole Kindred A, DO   50 mg at 06/19/20 [id number]   Current Outpatient Medications  Medication Sig Dispense Refill  . carvedilol (COREG) 12.5 MG tablet Take 12.5 mg by mouth 2 (two) times daily.    . furosemide (LASIX) 40 MG tablet Take 40 mg by mouth 2 (two) times daily.    . hydrALAZINE (APRESOLINE) 25 MG tablet Take 25 mg by mouth 2 (two) times daily.    . insulin degludec (TRESIBA) 100 UNIT/ML FlexTouch Pen Inject 10 Units into the skin at bedtime.    . isosorbide mononitrate (IMDUR) 30  MG 24 hr tablet Take 30 mg by mouth daily.    Marland Kitchen losartan (COZAAR) 100 MG tablet Take 100 mg by mouth daily.     Past Medical History:  Diagnosis Date  . Diabetes mellitus without complication [facility])    Past Surgical History:  Procedure Laterality Date  . EYE SURGERY    . RIGHT/LEFT HEART CATH AND CORONARY ANGIOGRAPHY N/A 05/25/2018   Procedure: RIGHT/LEFT HEART CATH AND CORONARY ANGIOGRAPHY;  Surgeon: Corey Skains, MD;  Location: [place] [facility];  Service: Cardiovascular;  Laterality: N/A;  . TONSILLECTOMY     Social History Social History   Tobacco Use  . Smoking status: Never Smoker  . Smokeless tobacco: Never Used  Substance Use Topics  . Alcohol use: Not Currently  . Drug use: Not Currently   Family History Family  History  Problem Relation Age of Onset  . Hypertension Mother   Denies family history of peripheral artery disease, venous disease or renal disease.  No Known Allergies  REVIEW OF SYSTEMS (Negative unless checked)  Constitutional: '[]'$ Weight loss  '[]'$ Fever  '[]'$ Chills Cardiac: '[]'$ Chest pain   '[]'$ Chest pressure   '[]'$ Palpitations   '[x]'$ Shortness of breath when laying flat   '[x]'$ Shortness of breath at rest   '[x]'$ Shortness of breath with exertion. Vascular:  '[]'$ Pain in legs with walking   '[]'$ Pain in legs at rest   '[]'$ Pain in legs when laying flat   '[]'$ Claudication   '[]'$ Pain in feet when walking  '[]'$ Pain in feet at rest  '[]'$ Pain in feet when laying flat   '[]'$ History of DVT   '[]'$ Phlebitis   '[x]'$ Swelling in legs   '[]'$ Varicose veins   '[]'$ Non-healing ulcers Pulmonary:   '[]'$ Uses home oxygen   '[]'$ Productive cough   '[]'$ Hemoptysis   '[]'$ Wheeze  '[]'$ COPD   '[]'$ Asthma Neurologic:  '[]'$ Dizziness  '[]'$ Blackouts   '[]'$ Seizures   '[]'$ History of stroke   '[]'$ History of TIA  '[]'$ Aphasia   '[]'$ Temporary blindness   '[]'$ Dysphagia   '[]'$ Weakness or numbness in arms   '[]'$ Weakness or numbness in legs Musculoskeletal:  '[]'$ Arthritis   '[]'$ Joint swelling   '[]'$ Joint pain   '[]'$ Low back pain Hematologic:  '[]'$ Easy bruising  '[]'$ Easy bleeding   '[]'$ Hypercoagulable state   '[]'$ Anemic  '[]'$ Hepatitis Gastrointestinal:  '[]'$ Blood in stool   '[]'$ Vomiting blood  '[]'$ Gastroesophageal reflux/heartburn   '[]'$ Difficulty swallowing. Genitourinary:  '[x]'$ Chronic kidney disease   '[]'$ Difficult urination  '[]'$ Frequent urination  '[]'$ Burning with urination   '[]'$ Blood in urine Skin:  '[]'$ Rashes   '[]'$ Ulcers   '[]'$ Wounds Psychological:  '[]'$ History of anxiety   '[]'$  History of major depression.  Physical Examination  Vitals:   06/19/20 0830 06/19/20 0910 06/19/20 1000 06/19/20 1230  BP: (!) 80/49 (!) 78/48 (!) 80/46 (!) 75/44  Pulse: 64 62 67 61  Resp: '11 11 15 18  '$ Temp:      TempSrc:      SpO2: 96% 96% 96% 95%  Weight:      Height:       Body mass index is 30.79 kg/m. Gen:  WD/WN, NAD Head: [place]/AT, No temporalis  wasting. Prominent temp pulse not noted. Ear/Nose/Throat: Hearing grossly intact, nares w/o erythema or drainage, oropharynx w/o Erythema/Exudate Eyes: Sclera non-icteric, conjunctiva clear Neck: Trachea midline.  No JVD.  Pulmonary:  Good air movement, respirations not labored, equal bilaterally.  Cardiac: RRR, normal S1, S2. Vascular:  Vessel Right Left  Radial Palpable Palpable  Ulnar Palpable Palpable  Gastrointestinal: soft, non-tender/non-distended. No guarding/reflex.  Musculoskeletal: M/S 5/5 throughout.  Extremities without ischemic changes.  No deformity or atrophy. No edema. Neurologic: Sensation grossly intact in extremities.  Symmetrical.  Speech is fluent. Motor exam as listed above. Psychiatric: Judgment intact, Mood & affect appropriate for pt's clinical situation. Dermatologic: No rashes or ulcers noted.  No cellulitis or open wounds. Lymph : No Cervical, Axillary, or Inguinal lymphadenopathy.  CBC Lab Results  Component Value Date   WBC 6.2 06/19/2020   HGB 12.6 (L) 06/19/2020   HCT 36.7 (L) 06/19/2020   MCV 90.8 06/19/2020   PLT 240 06/19/2020   BMET    Component Value Date/Time   NA 134 (L) 06/19/2020 0835   K 4.3 06/19/2020 0835   CL 109 06/19/2020 0835   CO2 9 (L) 06/19/2020 0835   GLUCOSE 112 (H) 06/19/2020 0835   BUN 131 (H) 06/19/2020 0835   CREATININE 16.80 (H) 06/19/2020 0835   CALCIUM 7.4 (L) 06/19/2020 0835   GFRNONAA 3 (L) 06/19/2020 0835   GFRAA 47 (L) 05/27/2018 0424   Estimated Creatinine Clearance: 7.5 mL/min (A) (by C-G formula based on SCr of 16.8 mg/dL (H)).  COAG Lab Results  Component Value Date   INR 1.2 06/18/2020   Radiology US Renal  Result Date: 06/18/2020 CLINICAL DATA:  Acute renal failure EXAM: RENAL / URINARY TRACT ULTRASOUND COMPLETE COMPARISON:  05/23/2018 FINDINGS: Right Kidney: Renal measurements: 12.7 x 5.5 x 6.1 cm = volume: 224 mL. Echogenic cortex. No hydronephrosis or mass  Left Kidney: Renal measurements: 13.3 x 6.6 x 5.9 cm = volume: 270 mL. Echogenic cortex. No hydronephrosis or mass. Bladder: Appears normal for degree of bladder distention. Other: Echogenic mass within the right hepatic lobe measuring 2.1 x 2 x 1.9 cm, not seen on prior ultrasound IMPRESSION: 1. Echogenic kidneys bilaterally consistent with medical renal disease. No hydronephrosis. 2. 2.1 cm solid echogenic mass in the right hepatic lobe not clearly identified on prior ultrasound, question hemangioma. When the patient is clinically stable and able to follow directions and hold their breath (preferably as an outpatient) further evaluation with dedicated abdominal MRI should be considered. Electronically Signed   By: Donavan Foil M.D.   On: 06/18/2020 18:06   DG Chest Port 1 View  Result Date: 06/18/2020 CLINICAL DATA:  Questionable sepsis. EXAM: PORTABLE CHEST 1 VIEW COMPARISON:  05/22/2018. FINDINGS: Severe cardiomegaly again noted. Pulmonary venous congestion. Mild bilateral interstitial prominence. CHF cannot be excluded. Pneumonitis cannot be excluded. No pleural effusion or pneumothorax. Mild thoracic spine scoliosis. No acute bony abnormality. IMPRESSION: Severe cardiomegaly again noted. Pulmonary venous congestion. Mild bilateral interstitial prominence. Mild CHF may be present. Pneumonitis cannot be excluded. Electronically Signed   By: Marcello Moores  Register   On: 06/18/2020 14:17   ECHOCARDIOGRAM COMPLETE  Result Date: 06/19/2020    ECHOCARDIOGRAM REPORT   Patient Name:   Adrian Evans Date of Exam: 06/19/2020 Medical Rec #:  [id number]        Height:       72.0 in Accession #:    [id number]       Weight:       227.0 lb Date of Birth:  Apr 29, 1986         BSA:          2.248 m Patient Age:    35 years         BP:           80/49 mmHg Patient Gender: M  HR:           64 bpm. Exam Location:  [facility] Procedure: 2D Echo, Color Doppler, Cardiac Doppler and Strain Analysis Indications:     I50.21  CHF-Acute Systolic  History:         Patient has prior history of Echocardiogram examinations.                  HFpEF, CKD; Risk Factors:Diabetes and Hypertension.  Sonographer:     Charmayne Sheer RDCS (AE) Referring Phys:  [id number] Claiborne Billings A GRIFFITH Diagnosing Phys: Yolonda Kida MD  Sonographer Comments: Global longitudinal strain was attempted. IMPRESSIONS  1. Left ventricular ejection fraction, by estimation, is 40 to 45%. The left ventricle has mildly decreased function. The left ventricle demonstrates global hypokinesis. The left ventricular internal cavity size was moderately dilated. There is moderate  concentric left ventricular hypertrophy. Left ventricular diastolic parameters were normal. There is the interventricular septum is flattened in diastole ('D' shaped left ventricle), consistent with right ventricular volume overload.  2. Right ventricular systolic function is moderately reduced. The right ventricular size is moderately enlarged.  3. A small pericardial effusion is present. The pericardial effusion is posterior to the left ventricle and circumferential. There is no evidence of cardiac tamponade.  4. The mitral valve is grossly normal. Trivial mitral valve regurgitation.  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  6. The aortic valve is grossly normal. Aortic valve regurgitation is not visualized. FINDINGS  Left Ventricle: Left ventricular ejection fraction, by estimation, is 40 to 45%. The left ventricle has mildly decreased function. The left ventricle demonstrates global hypokinesis. The left ventricular internal cavity size was moderately dilated. There is moderate concentric left ventricular hypertrophy. The interventricular septum is flattened in diastole ('D' shaped left ventricle), consistent with right ventricular volume overload. Left ventricular diastolic parameters were normal. Right Ventricle: The right ventricular size is moderately enlarged. No increase in right ventricular wall  thickness. Right ventricular systolic function is moderately reduced. Left Atrium: Left atrial size was normal in size. Right Atrium: Right atrial size was normal in size. Pericardium: A small pericardial effusion is present. The pericardial effusion is posterior to the left ventricle and circumferential. There is no evidence of cardiac tamponade. Mitral Valve: The mitral valve is grossly normal. Trivial mitral valve regurgitation. MV peak gradient, 3.8 mmHg. The mean mitral valve gradient is 2.0 mmHg. Tricuspid Valve: The tricuspid valve is myxomatous. Tricuspid valve regurgitation is mild. Aortic Valve: The aortic valve is grossly normal. Aortic valve regurgitation is not visualized. Aortic valve mean gradient measures 3.0 mmHg. Aortic valve peak gradient measures 4.9 mmHg. Aortic valve area, by VTI measures 2.67 cm. Pulmonic Valve: The pulmonic valve was normal in structure. Pulmonic valve regurgitation is not visualized. Aorta: The aortic arch was not well visualized. IAS/Shunts: No atrial level shunt detected by color flow Doppler.  LEFT VENTRICLE PLAX 2D LVIDd:         6.10 cm      Diastology LVIDs:         3.70 cm      LV e' medial:    7.40 cm/s LV PW:         1.30 cm      LV E/e' medial:  11.5 LV IVS:        1.00 cm      LV e' lateral:   5.66 cm/s LVOT diam:     2.30 cm      LV E/e' lateral: 15.0 LV SV:  55 LV SV Index:   25 LVOT Area:     4.15 cm  LV Volumes (MOD) LV vol d, MOD A2C: 176.0 ml LV vol d, MOD A4C: 162.0 ml LV vol s, MOD A2C: 94.8 ml LV vol s, MOD A4C: 70.6 ml LV SV MOD A2C:     81.2 ml LV SV MOD A4C:     162.0 ml LV SV MOD BP:      85.5 ml RIGHT VENTRICLE RV Basal diam:  4.10 cm RV Mid diam:    5.40 cm LEFT ATRIUM             Index       RIGHT ATRIUM           Index LA diam:        3.70 cm 1.65 cm/m  RA Area:     22.20 cm LA Vol (A2C):   59.0 ml 26.24 ml/m RA Volume:   64.20 ml  28.55 ml/m LA Vol (A4C):   65.5 ml 29.13 ml/m LA Biplane Vol: 64.8 ml 28.82 ml/m  AORTIC VALVE                    PULMONIC VALVE AV Area (Vmax):    2.54 cm    PV Vmax:       1.11 m/s AV Area (Vmean):   2.46 cm    PV Vmean:      75.300 cm/s AV Area (VTI):     2.67 cm    PV VTI:        0.228 m AV Vmax:           111.00 cm/s PV Peak grad:  4.9 mmHg AV Vmean:          77.900 cm/s PV Mean grad:  3.0 mmHg AV VTI:            0.207 m AV Peak Grad:      4.9 mmHg AV Mean Grad:      3.0 mmHg LVOT Vmax:         67.90 cm/s LVOT Vmean:        46.100 cm/s LVOT VTI:          0.133 m LVOT/AV VTI ratio: 0.64  AORTA Ao Root diam: 3.80 cm MITRAL VALVE MV Area (PHT): 3.24 cm    SHUNTS MV Area VTI:   2.08 cm    Systemic VTI:  0.13 m MV Peak grad:  3.8 mmHg    Systemic Diam: 2.30 cm MV Mean grad:  2.0 mmHg MV Vmax:       0.98 m/s MV Vmean:      61.7 cm/s MV Decel Time: 234 msec MV E velocity: 84.80 cm/s MV A velocity: 45.80 cm/s MV E/A ratio:  1.85 Adrian D Callwood MD Electronically signed by Yolonda Kida MD Signature Date/Time: 06/19/2020/11:26:37 AM    Final    Assessment/Plan Adrian Evans is a 35 year old male with medical history significant of type 1 diabetes, HFrEF with EF 20 to 25% on echo in 2019, CKD stage IIIb followed by nephrology, hypertension who presented to the ED from home with several days of generalized weakness and malaise.  He also reports diffuse myalgias, nausea vomiting with inability to keep down oral intake, nasal congestion with sneezing and some cough.  1.  Acute on chronic renal failure: Patient with known history of chronic kidney disease stage III who presents today with progressively worsening renal function.  At this time, nephrology  would like to initiate dialysis as patient does not have an adequate access.  We will place a PermCath to allow the patient to dialyze in the inpatient outpatient setting more permanent access is planned.  Procedure, risks and benefits were explained to the patient and his wife at the bedside.  All questions were answered.  Patient was to proceed.  Will plan on  placement tomorrow.  2.  Iron deficiency anemia: Asymptomatic at this time Followed by the patient's primary care physician and nephrologist  3.  Diabetes Encouraged good control as its slows the progression of atherosclerotic disease  Discussed with Dr. Mayme Genta, PA-C  06/19/2020 3:07 PM  This note was created with Dragon medical transcription system.  Any error is purely unintentional

## 2020-06-19 NOTE — Consult Note (Signed)
CARDIOLOGY CONSULT NOTE               Patient ID: OCTAVIS LIETZAU MRN: [id number] DOB/AGE: [birth year] 35 y.o.  Admit date: 06/18/2020 Referring Physician Dr Nicole Kindred hospitalist Primary Physician Belinda Fisher MD Primary Cardiologist Dr Serafina Royals Reason for Consultation Cardiomyopathy systolic/Weakness  HPI: Patient presented with generalized weakness history of end-stage renal disease known cardiomyopathy EF around 20 to 25% hypertension diabetes.  Patient complains of progressive weakness and fatigue so came to the emergency room for evaluation.  Denies any chest pain but has had progressive malaise myalgia nausea vomiting with some cough and congestion states has been compliant with his home medication but still has had some lower extremity edema he is found to have hypotension in the emergency room and a BNP of close to 2000 but his renal function is diminished to less than 10 GFR.  Denies any previous myocardial infarction but has had cardiomyopathy and weakness presumably related to poorly controlled hypertension.  Patient has been followed by nephrology who then recommended echocardiogram and had IV hydration and cardiology was consulted for evaluation patient normally sees Dr. Nehemiah Massed  Review of systems complete and found to be negative unless listed above     Past Medical History:  Diagnosis Date  . Diabetes mellitus without complication [facility])     Past Surgical History:  Procedure Laterality Date  . EYE SURGERY    . RIGHT/LEFT HEART CATH AND CORONARY ANGIOGRAPHY N/A 05/25/2018   Procedure: RIGHT/LEFT HEART CATH AND CORONARY ANGIOGRAPHY;  Surgeon: Corey Skains, MD;  Location: [place] [facility];  Service: Cardiovascular;  Laterality: N/A;  . TONSILLECTOMY      (Not in a hospital admission)  Social History   Socioeconomic History  . Marital status: Married    Spouse name: Not on file  . Number of children: 2  . Years of education: Not on file  .  Highest education level: Not on file  Occupational History  . Not on file  Tobacco Use  . Smoking status: Never Smoker  . Smokeless tobacco: Never Used  Substance and Sexual Activity  . Alcohol use: Not Currently  . Drug use: Not Currently  . Sexual activity: Not Currently  Other Topics Concern  . Not on file  Social History Narrative   Education officer, museum   Social Determinants of Health   Financial Resource Strain: Not on file  Food Insecurity: Not on file  Transportation Needs: Not on file  Physical Activity: Not on file  Stress: Not on file  Social Connections: Not on file  Intimate Partner Violence: Not on file    Family History  Problem Relation Age of Onset  . Hypertension Mother       Review of systems complete and found to be negative unless listed above      PHYSICAL EXAM  General: Well developed, well nourished, in no acute distress HEENT:  Normocephalic and atramatic Neck:  No JVD.  Lungs: Clear bilaterally to auscultation and percussion. Heart: HRRR . Normal S1 and S2 without gallops or murmurs.  Abdomen: Bowel sounds are positive, abdomen soft and non-tender  Msk:  Back normal, normal gait. Normal strength and tone for age. Extremities: No clubbing, cyanosis or 2 +edema.   Neuro: Alert and oriented X 3. Psych:  Good affect, responds appropriately  Labs:   Lab Results  Component Value Date   WBC 6.2 06/19/2020   HGB 12.6 (L) 06/19/2020   HCT 36.7 (L) 06/19/2020  MCV 90.8 06/19/2020   PLT 240 06/19/2020    Recent Labs  Lab 06/18/20 1334 06/18/20 1616 06/19/20 0835  NA 132*  --  134*  K 4.5  --  4.3  CL 107  --  109  CO2 8*  --  9*  BUN 125*  --  131*  CREATININE 16.42*   < > 16.80*  CALCIUM 7.7*  --  7.4*  PROT 6.2*  --   --   BILITOT 1.2  --   --   ALKPHOS 78  --   --   ALT 12  --   --   AST 6*  --   --   GLUCOSE 167*  --  112*   < > = values in this interval not displayed.   Lab Results  Component Value Date   [id number]  06/18/2020   No results found for: CHOL No results found for: HDL No results found for: LDLCALC No results found for: TRIG No results found for: CHOLHDL No results found for: LDLDIRECT    Radiology: US Renal  Result Date: 06/18/2020 CLINICAL DATA:  Acute renal failure EXAM: RENAL / URINARY TRACT ULTRASOUND COMPLETE COMPARISON:  05/23/2018 FINDINGS: Right Kidney: Renal measurements: 12.7 x 5.5 x 6.1 cm = volume: 224 mL. Echogenic cortex. No hydronephrosis or mass Left Kidney: Renal measurements: 13.3 x 6.6 x 5.9 cm = volume: 270 mL. Echogenic cortex. No hydronephrosis or mass. Bladder: Appears normal for degree of bladder distention. Other: Echogenic mass within the right hepatic lobe measuring 2.1 x 2 x 1.9 cm, not seen on prior ultrasound IMPRESSION: 1. Echogenic kidneys bilaterally consistent with medical renal disease. No hydronephrosis. 2. 2.1 cm solid echogenic mass in the right hepatic lobe not clearly identified on prior ultrasound, question hemangioma. When the patient is clinically stable and able to follow directions and hold their breath (preferably as an outpatient) further evaluation with dedicated abdominal MRI should be considered. Electronically Signed   By: Donavan Foil M.D.   On: 06/18/2020 18:06   DG Chest Port 1 View  Result Date: 06/18/2020 CLINICAL DATA:  Questionable sepsis. EXAM: PORTABLE CHEST 1 VIEW COMPARISON:  05/22/2018. FINDINGS: Severe cardiomegaly again noted. Pulmonary venous congestion. Mild bilateral interstitial prominence. CHF cannot be excluded. Pneumonitis cannot be excluded. No pleural effusion or pneumothorax. Mild thoracic spine scoliosis. No acute bony abnormality. IMPRESSION: Severe cardiomegaly again noted. Pulmonary venous congestion. Mild bilateral interstitial prominence. Mild CHF may be present. Pneumonitis cannot be excluded. Electronically Signed   By: Marcello Moores  Register   On: 06/18/2020 14:17   ECHOCARDIOGRAM COMPLETE  Result Date: 06/19/2020     ECHOCARDIOGRAM REPORT   Patient Name:   CRISTINO BEDORE Date of Exam: 06/19/2020 Medical Rec #:  [id number]        Height:       72.0 in Accession #:    [id number]       Weight:       227.0 lb Date of Birth:  11/04/1985         BSA:          2.248 m Patient Age:    35 years         BP:           80/49 mmHg Patient Gender: M                HR:           64 bpm. Exam Location:  [facility] Procedure: 2D Echo, Color  Doppler, Cardiac Doppler and Strain Analysis Indications:     [id number] CHF-Acute Systolic  History:         Patient has prior history of Echocardiogram examinations.                  HFpEF, CKD; Risk Factors:Diabetes and Hypertension.  Sonographer:     Charmayne Sheer RDCS (AE) Referring Phys:  [id number] Claiborne Billings A GRIFFITH Diagnosing Phys: Yolonda Kida MD  Sonographer Comments: Global longitudinal strain was attempted. IMPRESSIONS  1. Left ventricular ejection fraction, by estimation, is 40 to 45%. The left ventricle has mildly decreased function. The left ventricle demonstrates global hypokinesis. The left ventricular internal cavity size was moderately dilated. There is moderate  concentric left ventricular hypertrophy. Left ventricular diastolic parameters were normal. There is the interventricular septum is flattened in diastole ('D' shaped left ventricle), consistent with right ventricular volume overload.  2. Right ventricular systolic function is moderately reduced. The right ventricular size is moderately enlarged.  3. A small pericardial effusion is present. The pericardial effusion is posterior to the left ventricle and circumferential. There is no evidence of cardiac tamponade.  4. The mitral valve is grossly normal. Trivial mitral valve regurgitation.  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  6. The aortic valve is grossly normal. Aortic valve regurgitation is not visualized. FINDINGS  Left Ventricle: Left ventricular ejection fraction, by estimation, is 40 to 45%. The left ventricle has mildly decreased  function. The left ventricle demonstrates global hypokinesis. The left ventricular internal cavity size was moderately dilated. There is moderate concentric left ventricular hypertrophy. The interventricular septum is flattened in diastole ('D' shaped left ventricle), consistent with right ventricular volume overload. Left ventricular diastolic parameters were normal. Right Ventricle: The right ventricular size is moderately enlarged. No increase in right ventricular wall thickness. Right ventricular systolic function is moderately reduced. Left Atrium: Left atrial size was normal in size. Right Atrium: Right atrial size was normal in size. Pericardium: A small pericardial effusion is present. The pericardial effusion is posterior to the left ventricle and circumferential. There is no evidence of cardiac tamponade. Mitral Valve: The mitral valve is grossly normal. Trivial mitral valve regurgitation. MV peak gradient, 3.8 mmHg. The mean mitral valve gradient is 2.0 mmHg. Tricuspid Valve: The tricuspid valve is myxomatous. Tricuspid valve regurgitation is mild. Aortic Valve: The aortic valve is grossly normal. Aortic valve regurgitation is not visualized. Aortic valve mean gradient measures 3.0 mmHg. Aortic valve peak gradient measures 4.9 mmHg. Aortic valve area, by VTI measures 2.67 cm. Pulmonic Valve: The pulmonic valve was normal in structure. Pulmonic valve regurgitation is not visualized. Aorta: The aortic arch was not well visualized. IAS/Shunts: No atrial level shunt detected by color flow Doppler.  LEFT VENTRICLE PLAX 2D LVIDd:         6.10 cm      Diastology LVIDs:         3.70 cm      LV e' medial:    7.40 cm/s LV PW:         1.30 cm      LV E/e' medial:  11.5 LV IVS:        1.00 cm      LV e' lateral:   5.66 cm/s LVOT diam:     2.30 cm      LV E/e' lateral: 15.0 LV SV:         55 LV SV Index:   25 LVOT Area:     4.15 cm  LV Volumes (MOD) LV vol d, MOD A2C: 176.0 ml LV vol d, MOD A4C: 162.0 ml LV vol s,  MOD A2C: 94.8 ml LV vol s, MOD A4C: 70.6 ml LV SV MOD A2C:     81.2 ml LV SV MOD A4C:     162.0 ml LV SV MOD BP:      85.5 ml RIGHT VENTRICLE RV Basal diam:  4.10 cm RV Mid diam:    5.40 cm LEFT ATRIUM             Index       RIGHT ATRIUM           Index LA diam:        3.70 cm 1.65 cm/m  RA Area:     22.20 cm LA Vol (A2C):   59.0 ml 26.24 ml/m RA Volume:   64.20 ml  28.55 ml/m LA Vol (A4C):   65.5 ml 29.13 ml/m LA Biplane Vol: 64.8 ml 28.82 ml/m  AORTIC VALVE                   PULMONIC VALVE AV Area (Vmax):    2.54 cm    PV Vmax:       1.11 m/s AV Area (Vmean):   2.46 cm    PV Vmean:      75.300 cm/s AV Area (VTI):     2.67 cm    PV VTI:        0.228 m AV Vmax:           111.00 cm/s PV Peak grad:  4.9 mmHg AV Vmean:          77.900 cm/s PV Mean grad:  3.0 mmHg AV VTI:            0.207 m AV Peak Grad:      4.9 mmHg AV Mean Grad:      3.0 mmHg LVOT Vmax:         67.90 cm/s LVOT Vmean:        46.100 cm/s LVOT VTI:          0.133 m LVOT/AV VTI ratio: 0.64  AORTA Ao Root diam: 3.80 cm MITRAL VALVE MV Area (PHT): 3.24 cm    SHUNTS MV Area VTI:   2.08 cm    Systemic VTI:  0.13 m MV Peak grad:  3.8 mmHg    Systemic Diam: 2.30 cm MV Mean grad:  2.0 mmHg MV Vmax:       0.98 m/s MV Vmean:      61.7 cm/s MV Decel Time: 234 msec MV E velocity: 84.80 cm/s MV A velocity: 45.80 cm/s MV E/A ratio:  1.85 Elven Laboy D Taiwo Fish MD Electronically signed by Yolonda Kida MD Signature Date/Time: 06/19/2020/11:26:37 AM    Final     EKG: Low voltage rate of 90 nsswt  ASSESSMENT AND PLAN:  Cardiomypathy systolic DM CRI stage V Hypotension Edema Generalized Weakness Pericardial effusion . Plan Generalized weakness possibly related to cardiomyopathy and dehydration recommend gentle hydration consider pressor therapy if necessary rule out sepsis Agree with ECHO for evaluation of hypotension Continue diabetes control continue current therapy for diabetes insulin as necessary Maintain hypertension control but will  hold most and active antihypertensive because of his hypotension currently Agree with Nephrology input for possible dialysis therapy because of worsening renal function Consider dialysis for ESRD F/U EKG and troponins Small pericardial effusion found on echo but there is no indication for pericardiocentesis no evidence of tamponade  may be related to uremia     Signed: Yolonda Kida MD 06/19/2020, 5:25 PM

## 2020-06-19 NOTE — ED Notes (Signed)
Patient given warm blankets. States "I just wanna sleep". Pt updated on plan of care. Denies further needs at this time.

## 2020-06-20 ENCOUNTER — Encounter
Admission: EM | Disposition: A | Discharge status: Other Acute Inpatient Hospital | Payer: Self-pay | Source: Home / Self Care | Attending: Internal Medicine

## 2020-06-20 ENCOUNTER — Inpatient Hospital Stay: Payer: BC Managed Care – PPO

## 2020-06-20 DIAGNOSIS — N170 Acute kidney failure with tubular necrosis: Secondary | ICD-10-CM | POA: Diagnosis not present

## 2020-06-20 DIAGNOSIS — E861 Hypovolemia: Secondary | ICD-10-CM

## 2020-06-20 DIAGNOSIS — J810 Acute pulmonary edema: Secondary | ICD-10-CM

## 2020-06-20 DIAGNOSIS — E785 Hyperlipidemia, unspecified: Secondary | ICD-10-CM

## 2020-06-20 DIAGNOSIS — E1069 Type 1 diabetes mellitus with other specified complication: Secondary | ICD-10-CM

## 2020-06-20 DIAGNOSIS — I5043 Acute on chronic combined systolic (congestive) and diastolic (congestive) heart failure: Secondary | ICD-10-CM | POA: Diagnosis not present

## 2020-06-20 LAB — COMPREHENSIVE METABOLIC PANEL
ALT: 12 U/L (ref 0–44)
AST: 10 U/L — ABNORMAL LOW (ref 15–41)
Albumin: 2.7 g/dL — ABNORMAL LOW (ref 3.5–5.0)
Alkaline Phosphatase: 69 U/L (ref 38–126)
Anion gap: 19 — ABNORMAL HIGH (ref 5–15)
BUN: 92 mg/dL — ABNORMAL HIGH (ref 6–20)
CO2: 13 mmol/L — ABNORMAL LOW (ref 22–32)
Calcium: 8 mg/dL — ABNORMAL LOW (ref 8.9–10.3)
Chloride: 105 mmol/L (ref 98–111)
Creatinine, Ser: 13.83 mg/dL — ABNORMAL HIGH (ref 0.61–1.24)
GFR, Estimated: 4 mL/min — ABNORMAL LOW (ref >60)
Glucose, Bld: 95 mg/dL (ref 70–99)
Potassium: 3.4 mmol/L — ABNORMAL LOW (ref 3.5–5.1)
Sodium: 137 mmol/L (ref 135–145)
Total Bilirubin: 2 mg/dL — ABNORMAL HIGH (ref 0.3–1.2)
Total Protein: 6.5 g/dL (ref 6.5–8.1)

## 2020-06-20 LAB — PARATHYROID HORMONE, INTACT (NO CA): PTH: 170 pg/mL — ABNORMAL HIGH (ref 15–65)

## 2020-06-20 LAB — PHOSPHORUS: Phosphorus: 6.3 mg/dL — ABNORMAL HIGH (ref 2.5–4.6)

## 2020-06-20 LAB — LACTIC ACID, PLASMA: Lactic Acid, Venous: 1.1 mmol/L (ref 0.5–1.9)

## 2020-06-20 LAB — PROCALCITONIN: Procalcitonin: 2.05 ng/mL

## 2020-06-20 LAB — MRSA PCR SCREENING: MRSA by PCR: NEGATIVE

## 2020-06-20 LAB — ANCA TITERS
Atypical P-ANCA titer: <1:20 {titer}
C-ANCA: <1:20 {titer}
P-ANCA: <1:20 {titer}

## 2020-06-20 LAB — BASIC METABOLIC PANEL
Anion gap: 17 — ABNORMAL HIGH (ref 5–15)
BUN: 125 mg/dL — ABNORMAL HIGH (ref 6–20)
CO2: 9 mmol/L — ABNORMAL LOW (ref 22–32)
Calcium: 7.6 mg/dL — ABNORMAL LOW (ref 8.9–10.3)
Chloride: 112 mmol/L — ABNORMAL HIGH (ref 98–111)
Creatinine, Ser: 18.3 mg/dL — ABNORMAL HIGH (ref 0.61–1.24)
GFR, Estimated: 3 mL/min — ABNORMAL LOW (ref >60)
Glucose, Bld: 80 mg/dL (ref 70–99)
Potassium: 4.8 mmol/L (ref 3.5–5.1)
Sodium: 138 mmol/L (ref 135–145)

## 2020-06-20 LAB — AMMONIA: Ammonia: 25 umol/L (ref 9–35)

## 2020-06-20 LAB — C4 COMPLEMENT: Complement C4, Body Fluid: 37 mg/dL (ref 12–38)

## 2020-06-20 LAB — GLOMERULAR BASEMENT MEMBRANE ANTIBODIES: GBM Ab: 3 units (ref 0–20)

## 2020-06-20 LAB — C3 COMPLEMENT: C3 Complement: 127 mg/dL (ref 82–167)

## 2020-06-20 LAB — GLUCOSE, CAPILLARY
Glucose-Capillary: 88 mg/dL (ref 70–99)
Glucose-Capillary: 86 mg/dL (ref 70–99)
Glucose-Capillary: 91 mg/dL (ref 70–99)
Glucose-Capillary: 93 mg/dL (ref 70–99)
Glucose-Capillary: 85 mg/dL (ref 70–99)

## 2020-06-20 LAB — MAGNESIUM: Magnesium: 1.8 mg/dL (ref 1.7–2.4)

## 2020-06-20 LAB — ANA W/REFLEX: Anti Nuclear Antibody (ANA): NEGATIVE

## 2020-06-20 SURGERY — DIALYSIS/PERMA CATHETER INSERTION
Anesthesia: Moderate Sedation

## 2020-06-20 MED ORDER — SODIUM BICARBONATE 8.4 % IV SOLN
150.0000 meq | Freq: Once | INTRAVENOUS | Status: AC
  Administered 2020-06-20: 150 meq via INTRAVENOUS

## 2020-06-20 MED ORDER — LORAZEPAM 2 MG/ML IJ SOLN
INTRAMUSCULAR | Status: AC
  Filled 2020-06-20: qty 1

## 2020-06-20 MED ORDER — SODIUM BICARBONATE 8.4 % IV SOLN: 50.0000 meq | Freq: Once | INTRAVENOUS | Status: DC

## 2020-06-20 MED ORDER — NOREPINEPHRINE 16 MG/250ML-% IV SOLN
0.0000 ug/min | INTRAVENOUS | Status: DC
  Administered 2020-06-21: 2 ug/min via INTRAVENOUS
  Filled 2020-06-20: qty 250

## 2020-06-20 MED ORDER — SODIUM CHLORIDE 0.9 % IV SOLN: 250.0000 mL | INTRAVENOUS | Status: DC

## 2020-06-20 MED ORDER — LORAZEPAM 2 MG/ML IJ SOLN
1.0000 mg | Freq: Once | INTRAMUSCULAR | Status: AC
  Administered 2020-06-20: 1 mg via INTRAVENOUS

## 2020-06-20 MED ORDER — NOREPINEPHRINE 4 MG/250ML-% IV SOLN
2.0000 ug/min | INTRAVENOUS | Status: DC
  Administered 2020-06-20: 2 ug/min via INTRAVENOUS
  Filled 2020-06-20: qty 250

## 2020-06-20 MED ORDER — ALBUMIN HUMAN 5 % IV SOLN
25.0000 g | Freq: Once | INTRAVENOUS | Status: AC
  Administered 2020-06-20: 25 g via INTRAVENOUS
  Filled 2020-06-20: qty 500

## 2020-06-20 MED ORDER — NOREPINEPHRINE 16 MG/250ML-% IV SOLN: 0.0000 ug/min | INTRAVENOUS | Status: DC

## 2020-06-20 MED ORDER — MIDODRINE HCL 5 MG PO TABS
10.0000 mg | ORAL_TABLET | Freq: Three times a day (TID) | ORAL | Status: DC
Start: 2020-06-20 — End: 2020-06-21
  Administered 2020-06-20: 10 mg via ORAL
  Filled 2020-06-20: qty 2

## 2020-06-20 MED ORDER — NALOXONE HCL 0.4 MG/ML IJ SOLN
INTRAMUSCULAR | Status: AC
  Filled 2020-06-20: qty 1

## 2020-06-20 MED ORDER — NALOXONE HCL 0.4 MG/ML IJ SOLN
0.4000 mg | INTRAMUSCULAR | Status: AC
  Administered 2020-06-20: 0.4 mg via INTRAVENOUS

## 2020-06-20 MED ORDER — SODIUM BICARBONATE 8.4 % IV SOLN
INTRAVENOUS | Status: AC
  Filled 2020-06-20: qty 150

## 2020-06-20 MED ORDER — LEVETIRACETAM IN NACL 1000 MG/100ML IV SOLN
1000.0000 mg | Freq: Once | INTRAVENOUS | Status: AC
  Administered 2020-06-20: 1000 mg via INTRAVENOUS
  Filled 2020-06-20: qty 100

## 2020-06-20 MED ORDER — SODIUM CHLORIDE 0.9 % IV BOLUS
500.0000 mL | Freq: Once | INTRAVENOUS | Status: AC
  Administered 2020-06-20: 500 mL via INTRAVENOUS

## 2020-06-20 MED ORDER — LEVETIRACETAM IN NACL 500 MG/100ML IV SOLN
500.0000 mg | INTRAVENOUS | Status: DC
  Filled 2020-06-20: qty 100

## 2020-06-20 MED ORDER — CHLORHEXIDINE GLUCONATE CLOTH 2 % EX PADS
6.0000 | MEDICATED_PAD | Freq: Every day | CUTANEOUS | Status: DC
  Administered 2020-06-20: 6 via TOPICAL

## 2020-06-20 MED ORDER — MIDAZOLAM HCL 2 MG/2ML IJ SOLN
2.0000 mg | Freq: Once | INTRAMUSCULAR | Status: AC
  Administered 2020-06-20: 2 mg via INTRAVENOUS
  Filled 2020-06-20: qty 2

## 2020-06-20 NOTE — Progress Notes (Signed)
Following patient for outpatient dialysis placement

## 2020-06-20 NOTE — Progress Notes (Signed)
[facility]  ROUNDING NOTE   Subjective:   Hypotensive and moved to ICU this morning. Rapid Response was called.  Dialysis access placed today Dr. Jonnie Finner  Wife at bedside  Objective:  Vital signs in last 24 hours:  Temp:  [94.3 F (34.6 C)-98.8 F (37.1 C)] 98.8 F (37.1 C) (01/28 0735) Pulse Rate:  [66-82] 81 (01/28 1217) Resp:  [16-18] 18 (01/28 0835) BP: (69-119)/(42-89) 92/53 (01/28 1217) SpO2:  [92 %-99 %] 97 % (01/28 1217) Weight:  [98.6 kg] 98.6 kg (01/28 0750)  Weight change:  Filed Weights   06/18/20 1301 06/18/20 1526 06/20/20 0750  Weight: 101.6 kg 103 kg 98.6 kg    Intake/Output: I/O last 3 completed shifts: In: 500 [IV Piggyback:500] Out: 0    Intake/Output this shift:  Total I/O In: 723.3 [IV Piggyback:723.3] Out: 0   Physical Exam: General: NAD, laying in bed  Head: Normocephalic, atraumatic. Moist oral mucosal membranes  Eyes: Anicteric, PERRL  Neck: Supple, trachea midline  Lungs:  Clear to auscultation  Heart: Regular rate and rhythm  Abdomen:  Soft, nontender,   Extremities:  no peripheral edema.  Neurologic: Nonfocal, moving all four extremities  Skin: No lesions  Access: RIJ temp HD 1/28 ICU    Basic Metabolic Panel: Recent Labs  Lab 06/18/20 1334 06/18/20 1616 06/18/20 2041 06/19/20 0835 06/20/20 0334  NA 132*  --   --  134* 138  K 4.5  --   --  4.3 4.8  CL 107  --   --  109 112*  CO2 8*  --   --  9* 9*  GLUCOSE 167*  --   --  112* 80  BUN 125*  --   --  131* 125*  CREATININE 16.42* 15.83*  --  16.80* 18.30*  CALCIUM 7.7*  --   --  7.4* 7.6*  MG  --   --   --  2.3  --   PHOS  --   --  9.3*  --   --     Liver Function Tests: Recent Labs  Lab 06/18/20 1334  AST 6*  ALT 12  ALKPHOS 78  BILITOT 1.2  PROT 6.2*  ALBUMIN 2.6*   No results for input(s): LIPASE, AMYLASE in the last 168 hours. No results for input(s): AMMONIA in the last 168 hours.  CBC: Recent Labs  Lab 06/18/20 1334 06/18/20 1616  06/19/20 0835  WBC 5.3 4.9 6.2  NEUTROABS 4.2  --   --   HGB 12.1* 12.1* 12.6*  HCT 35.5* 35.5* 36.7*  MCV 90.1 90.1 90.8  PLT 222 211 240    Cardiac Enzymes: Recent Labs  Lab 06/18/20 1508  CKTOTAL 397    BNP: Invalid input(s): POCBNP  CBG: Recent Labs  Lab 06/19/20 2150 06/20/20 0406 06/20/20 0820 06/20/20 1157 06/20/20 1252  GLUCAP 90 88 86 91 93    Microbiology: Results for orders placed or performed during the hospital encounter of 06/18/20  Blood culture (routine single)     Status: None (Preliminary result)   Collection Time: 06/18/20  3:08 PM   Specimen: BLOOD  Result Value Ref Range Status   Specimen Description BLOOD BLOOD LEFT ARM  Final   Special Requests   Final    BOTTLES DRAWN AEROBIC AND ANAEROBIC Blood Culture results may not be optimal due to an inadequate volume of blood received in culture bottles   Culture   Final    NO GROWTH 2 DAYS Performed at [facility]  Lab, [place], [place] [address]    Report Status PENDING  Incomplete  SARS Coronavirus 2 by RT PCR (hospital order, performed in [facility] hospital lab) Nasopharyngeal Nasopharyngeal Swab     Status: None   Collection Time: 06/18/20  3:08 PM   Specimen: Nasopharyngeal Swab  Result Value Ref Range Status   SARS Coronavirus 2 NEGATIVE NEGATIVE Final    Comment: (NOTE) SARS-CoV-2 target nucleic acids are NOT DETECTED.  The SARS-CoV-2 RNA is generally detectable in upper and lower respiratory specimens during the acute phase of infection. The lowest concentration of SARS-CoV-2 viral copies this assay can detect is 250 copies / mL. A negative result does not preclude SARS-CoV-2 infection and should not be used as the sole basis for treatment or other patient management decisions.  A negative result may occur with improper specimen collection / handling, submission of specimen other than nasopharyngeal swab, presence of viral mutation(s) within the areas targeted  by this assay, and inadequate number of viral copies (<250 copies / mL). A negative result must be combined with clinical observations, patient history, and epidemiological information.  Fact Sheet for Patients:   StrictlyIdeas.no  Fact Sheet for Healthcare Providers: BankingDealers.co.za  This test is not yet approved or  cleared by the [place] FDA and has been authorized for detection and/or diagnosis of SARS-CoV-2 by FDA under an Emergency Use Authorization (EUA).  This EUA will remain in effect (meaning this test can be used) for the duration of the COVID-19 declaration under Section 564(b)(1) of the Act, 21 U.S.C. section 360bbb-3(b)(1), unless the authorization is terminated or revoked sooner.  Performed at [facility], [address] [place], [place] [address]   MRSA PCR Screening     Status: None   Collection Time: 06/20/20  1:19 PM   Specimen: Nasal Mucosa; Nasopharyngeal  Result Value Ref Range Status   MRSA by PCR NEGATIVE NEGATIVE Final    Comment:        The GeneXpert MRSA Assay (FDA approved for NASAL specimens only), is one component of a comprehensive MRSA colonization surveillance program. It is not intended to diagnose MRSA infection nor to guide or monitor treatment for MRSA infections. Performed at [facility], [address] [place], [place] [address]     Coagulation Studies: Recent Labs    06/18/20 1334  LABPROT 15.0  INR 1.2    Urinalysis: No results for input(s): COLORURINE, LABSPEC, PHURINE, GLUCOSEU, HGBUR, BILIRUBINUR, KETONESUR, PROTEINUR, UROBILINOGEN, NITRITE, LEUKOCYTESUR in the last 72 hours.  Invalid input(s): APPERANCEUR    Imaging: US Renal  Result Date: 06/18/2020 CLINICAL DATA:  Acute renal failure EXAM: RENAL / URINARY TRACT ULTRASOUND COMPLETE COMPARISON:  05/23/2018 FINDINGS: Right Kidney: Renal measurements: 12.7 x 5.5 x 6.1 cm = volume: 224 mL.  Echogenic cortex. No hydronephrosis or mass Left Kidney: Renal measurements: 13.3 x 6.6 x 5.9 cm = volume: 270 mL. Echogenic cortex. No hydronephrosis or mass. Bladder: Appears normal for degree of bladder distention. Other: Echogenic mass within the right hepatic lobe measuring 2.1 x 2 x 1.9 cm, not seen on prior ultrasound IMPRESSION: 1. Echogenic kidneys bilaterally consistent with medical renal disease. No hydronephrosis. 2. 2.1 cm solid echogenic mass in the right hepatic lobe not clearly identified on prior ultrasound, question hemangioma. When the patient is clinically stable and able to follow directions and hold their breath (preferably as an outpatient) further evaluation with dedicated abdominal MRI should be considered. Electronically Signed   By: Maudie Mercury  Francoise Ceo M.D.   On: 06/18/2020 18:06   ECHOCARDIOGRAM COMPLETE  Result Date: 06/19/2020    ECHOCARDIOGRAM REPORT   Patient Name:   RIKU YOHEY Date of Exam: 06/19/2020 Medical Rec #:  [id number]        Height:       72.0 in Accession #:    [id number]       Weight:       227.0 lb Date of Birth:  06-21-1985         BSA:          2.248 m Patient Age:    35 years         BP:           80/49 mmHg Patient Gender: M                HR:           64 bpm. Exam Location:  [facility] Procedure: 2D Echo, Color Doppler, Cardiac Doppler and Strain Analysis Indications:     [id number] CHF-Acute Systolic  History:         Patient has prior history of Echocardiogram examinations.                  HFpEF, CKD; Risk Factors:Diabetes and Hypertension.  Sonographer:     Charmayne Sheer RDCS (AE) Referring Phys:  [id number] Claiborne Billings A GRIFFITH Diagnosing Phys: Yolonda Kida MD  Sonographer Comments: Global longitudinal strain was attempted. IMPRESSIONS  1. Left ventricular ejection fraction, by estimation, is 40 to 45%. The left ventricle has mildly decreased function. The left ventricle demonstrates global hypokinesis. The left ventricular internal cavity size was moderately dilated. There  is moderate  concentric left ventricular hypertrophy. Left ventricular diastolic parameters were normal. There is the interventricular septum is flattened in diastole ('D' shaped left ventricle), consistent with right ventricular volume overload.  2. Right ventricular systolic function is moderately reduced. The right ventricular size is moderately enlarged.  3. A small pericardial effusion is present. The pericardial effusion is posterior to the left ventricle and circumferential. There is no evidence of cardiac tamponade.  4. The mitral valve is grossly normal. Trivial mitral valve regurgitation.  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  6. The aortic valve is grossly normal. Aortic valve regurgitation is not visualized. FINDINGS  Left Ventricle: Left ventricular ejection fraction, by estimation, is 40 to 45%. The left ventricle has mildly decreased function. The left ventricle demonstrates global hypokinesis. The left ventricular internal cavity size was moderately dilated. There is moderate concentric left ventricular hypertrophy. The interventricular septum is flattened in diastole ('D' shaped left ventricle), consistent with right ventricular volume overload. Left ventricular diastolic parameters were normal. Right Ventricle: The right ventricular size is moderately enlarged. No increase in right ventricular wall thickness. Right ventricular systolic function is moderately reduced. Left Atrium: Left atrial size was normal in size. Right Atrium: Right atrial size was normal in size. Pericardium: A small pericardial effusion is present. The pericardial effusion is posterior to the left ventricle and circumferential. There is no evidence of cardiac tamponade. Mitral Valve: The mitral valve is grossly normal. Trivial mitral valve regurgitation. MV peak gradient, 3.8 mmHg. The mean mitral valve gradient is 2.0 mmHg. Tricuspid Valve: The tricuspid valve is myxomatous. Tricuspid valve regurgitation is mild. Aortic  Valve: The aortic valve is grossly normal. Aortic valve regurgitation is not visualized. Aortic valve mean gradient measures 3.0 mmHg. Aortic valve peak gradient measures 4.9 mmHg. Aortic valve area, by VTI measures  2.67 cm. Pulmonic Valve: The pulmonic valve was normal in structure. Pulmonic valve regurgitation is not visualized. Aorta: The aortic arch was not well visualized. IAS/Shunts: No atrial level shunt detected by color flow Doppler.  LEFT VENTRICLE PLAX 2D LVIDd:         6.10 cm      Diastology LVIDs:         3.70 cm      LV e' medial:    7.40 cm/s LV PW:         1.30 cm      LV E/e' medial:  11.5 LV IVS:        1.00 cm      LV e' lateral:   5.66 cm/s LVOT diam:     2.30 cm      LV E/e' lateral: 15.0 LV SV:         55 LV SV Index:   25 LVOT Area:     4.15 cm  LV Volumes (MOD) LV vol d, MOD A2C: 176.0 ml LV vol d, MOD A4C: 162.0 ml LV vol s, MOD A2C: 94.8 ml LV vol s, MOD A4C: 70.6 ml LV SV MOD A2C:     81.2 ml LV SV MOD A4C:     162.0 ml LV SV MOD BP:      85.5 ml RIGHT VENTRICLE RV Basal diam:  4.10 cm RV Mid diam:    5.40 cm LEFT ATRIUM             Index       RIGHT ATRIUM           Index LA diam:        3.70 cm 1.65 cm/m  RA Area:     22.20 cm LA Vol (A2C):   59.0 ml 26.24 ml/m RA Volume:   64.20 ml  28.55 ml/m LA Vol (A4C):   65.5 ml 29.13 ml/m LA Biplane Vol: 64.8 ml 28.82 ml/m  AORTIC VALVE                   PULMONIC VALVE AV Area (Vmax):    2.54 cm    PV Vmax:       1.11 m/s AV Area (Vmean):   2.46 cm    PV Vmean:      75.300 cm/s AV Area (VTI):     2.67 cm    PV VTI:        0.228 m AV Vmax:           111.00 cm/s PV Peak grad:  4.9 mmHg AV Vmean:          77.900 cm/s PV Mean grad:  3.0 mmHg AV VTI:            0.207 m AV Peak Grad:      4.9 mmHg AV Mean Grad:      3.0 mmHg LVOT Vmax:         67.90 cm/s LVOT Vmean:        46.100 cm/s LVOT VTI:          0.133 m LVOT/AV VTI ratio: 0.64  AORTA Ao Root diam: 3.80 cm MITRAL VALVE MV Area (PHT): 3.24 cm    SHUNTS MV Area VTI:   2.08 cm     Systemic VTI:  0.13 m MV Peak grad:  3.8 mmHg    Systemic Diam: 2.30 cm MV Mean grad:  2.0 mmHg MV Vmax:       0.98 m/s  MV Vmean:      61.7 cm/s MV Decel Time: 234 msec MV E velocity: 84.80 cm/s MV A velocity: 45.80 cm/s MV E/A ratio:  1.85 Dwayne Prince Rome MD Electronically signed by Yolonda Kida MD Signature Date/Time: 06/19/2020/11:26:37 AM    Final      Medications:    . Chlorhexidine Gluconate Cloth  6 each Topical Daily  . heparin  5,000 Units Subcutaneous Q8H  . insulin aspart  0-5 Units Subcutaneous QHS  . insulin aspart  0-9 Units Subcutaneous TID WC  . midodrine  10 mg Oral TID WC  . naloxone      . sodium bicarbonate       acetaminophen **OR** acetaminophen, bisacodyl, HYDROcodone-acetaminophen, ondansetron **OR** ondansetron (ZOFRAN) IV, senna-docusate, traZODone  Assessment/ Plan:  Mr. OBRYAN ROSBERG is a 35 y.o. black male with diabetes mellitus type I, diabetic retinopathy, hypertension, systolic congestive heart failure who is admitted to [facility] on 06/18/2020 for Acute renal failure (ARF) ([place]) [N17.9] Acute renal failure, unspecified acute renal failure type ([place]) [N17.9]  1. Acute kidney injury on chronic kidney disease stage IIIB versus progression of kidney disease to end stage renal disease History of nephrotic range proteinuria. Negative serologic work up in 04/2018.  renal ultrasound reviewed with patient. Consistent with chronic kidney disease.  Uremic pericardial effusion.  - Emergent hemodialysis treatment. Plan on three hemodialysis treatments to help stabilize  - Due to patient age and health, recommend crash starting peritoneal dialysis. General surgery for PD catheter for next Tuesday 2/1.   2. Hypotension: with history of difficult to control hypertension. Also with chronic systolic congestive heart failure.  Secondary to pericardial effusion - Appreciate cardiology input - Vasopressors may be needed for blood pressure support.   3. Metabolic  acidosis: secondary to renal insufficiency.   4. Anemia with kidney failure: hemoglobin 12.6. Normocytic. Iron studies are at goal. Pending SPEP/UPEP  5. Diabetes mellitus type I with chronic kidney disease: hemoglobin A1c 7.4%. Due to patient's history of diabetic retinopathy, highly suggestive of diabetic nephropathy.    LOS: 2 Bennett Vanscyoc 1/28/20223:55 PM

## 2020-06-20 NOTE — Consult Note (Signed)
[facility] Tele[facility]urology Consult Services  Stat Consult  Date of Service:   06/20/2020 21:54:09  Diagnosis:     .  G93.41 - Encephalopathy Metabolic  Impression: 1. Metabolic encephalopathy 2. Possible seizures. I witnessed his left arm began to shake/jerk then it spread to right arm and to the face.  CT HEAD: Showed No Acute Hemorrhage or Acute Core Infarct Reviewed as above  Our recommendations are outlined below.  Diagnostic Studies: MRI brain without contrast Routine EEG  Laboratory Studies: Check B12/folate  Nursing Recommendations: Delirium precautions: ?Blinds open during the day, closed at night, frequent reorientation, minimize nighttime interruptions  Consultations: Toxic metabolic work up per primary team  DVT Prophylaxis: Choice of Primary Team  Disposition: Neurology will follow  Additional Recommendations: He may need LP. Single dose of ativan 1 mg. Load with Keppra 1000 mg then 500 mg q24h.   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  Metrics: [facility] Notification Time: 06/20/2020 21:52:09 Stamp Time: 06/20/2020 21:54:09 Callback Response Time: 06/20/2020 21:55:44   ----------------------------------------------------------------------------------------------------  Chief Complaint: Unresponsive  History of Present Illness: Patient is a 35 year old Male.  This is a 35 year old man with type 1 diabetes who was admitted with acute renal failure and underwent dialysis for the first time. He had a creatinine of 10. He had fluid overload. He has become progressively less responsive. He has generalized weakness and moans with pain with passive motion of his extremities. He has photophobia in one eye. Staff says he exhibited some rigidity. (None of his meds should do that.) He probably has a cardiomyopathy noting in 2020 EF 20% and echo today 40-45%. Normal  coronaries 2020. No fever. Lactate normal.    Past Medical History:     . Diabetes Mellitus     . There is NO history of Hypertension     . There is NO history of Hyperlipidemia     . There is NO history of Atrial Fibrillation     . There is NO history of Coronary Artery Disease     . There is NO history of Stroke     . There is NO history of Covid-19  Anticoagulant use:  No  Antiplatelet use: No    Examination: BP(107/57), Pulse(90), Blood Glucose(95)  Neuro Exam:    Lethargic to stuporous. No facial asymmetry. Generalized weakness.   Patient / Family was informed the Neurology Consult would occur via TeleHealth consult by way of interactive audio and video telecommunications and consented to receiving care in this manner.  Patient is being evaluated for possible acute neurologic impairment and high probability of imminent or life - threatening deterioration.I spent total of 36 minutes providing care to this patient, including time for face to face visit via telemedicine, review of medical records, imaging studies and discussion of findings with providers, the patient and / or family.   Dr Cindie Laroche   [facility] [phone]  Case [id number]

## 2020-06-20 NOTE — Plan of Care (Signed)
  Problem: Safety: Goal: Ability to remain free from injury will improve Outcome: Progressing   

## 2020-06-20 NOTE — Consult Note (Signed)
Name: Adrian Evans MRN: [id number] DOB: [birth year]     CONSULTATION DATE: 06/18/2020  REFERRING MD :  Manuella Ghazi  CHIEF COMPLAINT:  Altered mental status   STUDIES:  Echo EF 45%, renal U/S with medical disease  HISTORY OF PRESENT ILLNESS:    35 yo AAM with DM1 HFeEF and CKDIII presented to the ED for progressive fatigue and dyspnea. He had developed vomiting and soon after arrival became hypotensive, this corrected with 2L of fluid in the ED . He was found to have a BUN 125 creatinine of 16.42.  CXR proved volume overload and stigmata of CHF. He was admitted for nephrology evaluation. Today a Rapid Response was called from the floor for hypotension and stupor. The patient was indeed both upon my arrival. He was ordered 3 amps of bicarb for tCO2 of 9, and was reversed with Narcan for some earlier pain medication. He was given 500cc 5% albumin. With these measures his blood pressure improved and his GCS improved >8. He is planned for HD today, but will need the ICU for potential CRRT or pressor support of HD   PAST MEDICAL HISTORY :   has a past medical history of Diabetes mellitus without complication ([place]).  has a past surgical history that includes Tonsillectomy; Eye surgery; and RIGHT/LEFT HEART CATH AND CORONARY ANGIOGRAPHY (N/A, 05/25/2018). Prior to Admission medications   Medication Sig Start Date End Date Taking? Authorizing Provider  carvedilol (COREG) 12.5 MG tablet Take 12.5 mg by mouth 2 (two) times daily. 01/21/20  Yes [provider]  furosemide (LASIX) 40 MG tablet Take 40 mg by mouth 2 (two) times daily. 02/26/20 02/25/21 Yes [provider]  hydrALAZINE (APRESOLINE) 25 MG tablet Take 25 mg by mouth 2 (two) times daily. 10/02/19 10/01/20 Yes [provider]  insulin degludec (TRESIBA) 100 UNIT/ML FlexTouch Pen Inject 10 Units into the skin at bedtime.   Yes [provider]  isosorbide mononitrate (IMDUR) 30 MG 24 hr tablet Take 30 mg by mouth  daily. 01/21/20  Yes [provider]  losartan (COZAAR) 100 MG tablet Take 100 mg by mouth daily. 02/26/20 02/25/21 Yes [provider]   No Known Allergies  FAMILY HISTORY:  family history includes Hypertension in his mother. SOCIAL HISTORY:  reports that he has never smoked. He has never used smokeless tobacco. He reports previous alcohol use. He reports previous drug use.  REVIEW OF SYSTEMS:   Unable to obtain due to critical illness      Estimated body mass index is 29.48 kg/m as calculated from the following:   Height as of this encounter: 6' (1.829 m).   Weight as of this encounter: 98.6 kg.    VITAL SIGNS: Temp:  [94.3 F (34.6 C)-98.8 F (37.1 C)] 98.8 F (37.1 C) (01/28 0735) Pulse Rate:  [66-88] 88 (01/28 1822) Resp:  [11-21] 21 (01/28 1822) BP: (69-171)/(42-156) 118/74 (01/28 1830) SpO2:  [92 %-99 %] 96 % (01/28 1822) Weight:  [98.6 kg] 98.6 kg (01/28 0750)   I/O last 3 completed shifts: In: 500 [IV Piggyback:500] Out: 0  Total I/O In: 723.3 [IV Piggyback:723.3] Out: 0    SpO2: 96 %   Physical Examination:  GENERAL:critically ill appearing, +resp distress HEAD: Normocephalic, atraumatic.  EYES: Pupils equal, round, reactive to light.  No scleral icterus.  MOUTH: Moist mucosal membrane. NECK: Supple. No JVD.  PULMONARY: +rhonchi, +wheezing CARDIOVASCULAR: S1 and S2. Regular rate and rhythm. No murmurs, rubs, or gallops.  GASTROINTESTINAL: Soft, nontender, -distended.  Positive bowel sounds.  MUSCULOSKELETAL: No swelling, clubbing, or edema.  NEUROLOGIC: obtunded SKIN:intact,warm,dry  I personally reviewed lab work that was obtained in last 24 hrs. CXR Independently reviewed-pulmonary edema   MEDICATIONS: I have reviewed all medications and confirmed regimen as documented   CULTURE RESULTS   Recent Results (from the past 240 hour(s))  Blood culture (routine single)     Status: None (Preliminary result)   Collection Time:  06/18/20  3:08 PM   Specimen: BLOOD  Result Value Ref Range Status   Specimen Description BLOOD BLOOD LEFT ARM  Final   Special Requests   Final    BOTTLES DRAWN AEROBIC AND ANAEROBIC Blood Culture results may not be optimal due to an inadequate volume of blood received in culture bottles   Culture   Final    NO GROWTH 2 DAYS Performed at [facility], [address], [place], [place] [address]    Report Status PENDING  Incomplete  SARS Coronavirus 2 by RT PCR (hospital order, performed in [place] hospital lab) Nasopharyngeal Nasopharyngeal Swab     Status: None   Collection Time: 06/18/20  3:08 PM   Specimen: Nasopharyngeal Swab  Result Value Ref Range Status   SARS Coronavirus 2 NEGATIVE NEGATIVE Final    Comment: (NOTE) SARS-CoV-2 target nucleic acids are NOT DETECTED.  The SARS-CoV-2 RNA is generally detectable in upper and lower respiratory specimens during the acute phase of infection. The lowest concentration of SARS-CoV-2 viral copies this assay can detect is 250 copies / mL. A negative result does not preclude SARS-CoV-2 infection and should not be used as the sole basis for treatment or other patient management decisions.  A negative result may occur with improper specimen collection / handling, submission of specimen other than nasopharyngeal swab, presence of viral mutation(s) within the areas targeted by this assay, and inadequate number of viral copies (<250 copies / mL). A negative result must be combined with clinical observations, patient history, and epidemiological information.  Fact Sheet for Patients:   StrictlyIdeas.no  Fact Sheet for Healthcare Providers: BankingDealers.co.za  This test is not yet approved or  cleared by the [place] FDA and has been authorized for detection and/or diagnosis of SARS-CoV-2 by FDA under an Emergency Use Authorization (EUA).  This EUA will remain in effect  (meaning this test can be used) for the duration of the COVID-19 declaration under Section 564(b)(1) of the Act, 21 U.S.C. section 360bbb-3(b)(1), unless the authorization is terminated or revoked sooner.  Performed at [facility], [address] [place], [place] [address]   MRSA PCR Screening     Status: None   Collection Time: 06/20/20  1:19 PM   Specimen: Nasal Mucosa; Nasopharyngeal  Result Value Ref Range Status   MRSA by PCR NEGATIVE NEGATIVE Final    Comment:        The GeneXpert MRSA Assay (FDA approved for NASAL specimens only), is one component of a comprehensive MRSA colonization surveillance program. It is not intended to diagnose MRSA infection nor to guide or monitor treatment for MRSA infections. Performed at [facility], [address], [place], [place] [address]           IMAGING    DG Chest Port 1 View  Result Date: 06/20/2020 CLINICAL DATA:  Central line placement. EXAM: PORTABLE CHEST 1 VIEW COMPARISON:  06/18/2020 FINDINGS: New right internal jugular central venous line has its tip in the lower superior vena cava. No pneumothorax. Cardiac silhouette mildly enlarged.  No mediastinal or hilar masses. Pulmonary vascular prominence is similar to the prior study. No pulmonary edema. Lungs otherwise clear. IMPRESSION: 1. Right internal jugular central venous catheter has its tip in the lower superior vena cava. No pneumothorax. 2. No acute cardiopulmonary disease. Electronically Signed   By: Lajean Manes M.D.   On: 06/20/2020 16:10     Nutrition Status: NPO        ASSESSMENT AND PLAN    ACUTE SYSTOLIC CARDIAC FAILURE- EF -oxygen as needed -volume reduction per nephrology -follow up cardiac enzymes as indicated   ACUTE KIDNEY INJURY/Renal Failure -per nephrology -vas cath -CRRT vs IHD with pressor supprot -replete bicarb if not HD soon   NEUROLOGY - encephalopathic from uremia -caution with sedation   CARDIAC ICU  monitoring HFrEF Volume reduction via mechanical means Trend LVF and effusion with limited echo  GI GI PROPHYLAXIS as indicated  NUTRITIONAL STATUS NPO for now   ENDO - will use ICU hypoglycemic\Hyperglycemia protocol if needed    ELECTROLYTES -follow labs as needed -replace as needed -pharmacy consultation and following    DVT/GI PRX ordered and assessed  Critical Care Time devoted to patient care services described in this note is 45 minutes.   Overall, patient is critically ill, prognosis is guarded.  Patient with Multiorgan failure and at high risk for cardiac arrest and death.     Critical Care Time devoted to patient care services described in this note is 45 minutes.  Overall, patient is critically ill, prognosis is guarded.   Patient with Multiorgan failure and at high risk for cardiac arrest and death.  Images reviewed directly and interpretation in A&P is my own unless noted Labs reviewed and evaluated as noted in A/P

## 2020-06-20 NOTE — Progress Notes (Signed)
   06/20/20 0735  Assess: MEWS Score  Temp 98.8 F (37.1 C)  BP (!) 75/44  Pulse Rate 77  Resp 16  Level of Consciousness Responds to Voice  SpO2 92 %  O2 Device Room Air  Patient Activity (if Appropriate) In bed  Assess: MEWS Score  MEWS Temp 0  MEWS Systolic 2  MEWS Pulse 0  MEWS RR 0  MEWS LOC 1  MEWS Score 3  MEWS Score Color Yellow  Assess: if the MEWS score is Yellow or Red  Were vital signs taken at a resting state? Yes  Focused Assessment Change from prior assessment (see assessment flowsheet)  Early Detection of Sepsis Score *See Row Information* Low  MEWS guidelines implemented *See Row Information* No, other (Comment) (previously yellow mews,but remains hypotensive,possible transfer to ICU?)  Treat  MEWS Interventions Escalated (See documentation below);Administered scheduled meds/treatments;Administered prn meds/treatments  Pain Scale 0-10  Pain Score Asleep  Take Vital Signs  Increase Vital Sign Frequency  Yellow: Q 2hr X 2 then Q 4hr X 2, if remains yellow, continue Q 4hrs  Escalate  MEWS: Escalate Yellow: discuss with charge nurse/RN and consider discussing with provider and RRT  Notify: Charge Nurse/RN  Name of Charge Nurse/RN Notified Donella Stade, RN  Date Charge Nurse/RN Notified 06/20/20  Time Charge Nurse/RN Notified 0800  Notify: Provider  Provider Name/Title Dr. Max Sane  Date Provider Notified 06/20/20  Time Provider Notified [phone]  Notification Type Page  Notification Reason Change in status  Response See new orders  Date of Provider Response 06/20/20  Time of Provider Response 0848  Document  Patient Outcome Stabilized after interventions  Progress note created (see row info) Yes

## 2020-06-20 NOTE — Progress Notes (Signed)
STAT neurological consult requested. Dr. Lucia Gaskins from tele neurology used the portable camera to assess the patient. During this bedside assessment the patient exhibited seizure like activity. 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 Infectious work-up negative, PCT likely elevated due to renal failure. No need for antibiotics at this time.  Neurology recommendations: - low dose ativan 1 mg once - keppra loading dose 1000 mg followed by 500 mg Q 24h - EEG in the AM - f/u MRI brain wo contrast ordered for 06/21/20   Wife updated on current plan of care, all questions and concerns answered. Care RN Adrian Evans bedside and aware of interventions.   Adrian Evans, AGACNP-BC Acute Care Nurse Practitioner [facility]   [phone] / [phone] Please see Amion for pager details.

## 2020-06-20 NOTE — Progress Notes (Signed)
PROGRESS NOTE    Adrian Evans   [id number]  DOB: 1985/12/22  PCP: Lenard Simmer, MD    DOA: 06/18/2020 LOS: 2   Brief Narrative   35 y.o. male with medical history significant of type 1 diabetes, HFrEF with EF 20 to 25% on echo in 2019, CKD stage IIIb followed by nephrology, hypertension who presented to the ED on 06/18/20 from home with several days of generalized weakness, malaise, diffuse myalgias, nausea vomiting with inability to keep down oral intake, nasal congestion with sneezing and some cough.  He tested negative for Covid-19.  Evaluation in the ED revealed acute renal failure with creatinine over 16, and hypotension. Admitted to hospitalist service with nephrology consulted.  Renal ultrasound showed medical renal disease, no hydronephrosis.  Echocardiogram showed a pericardial effusion without signs of tamponade.  Cardiology consulted.  Vascular and general surgery consulted for establishment of dialysis access.  Peritoneal dialysis is planned.  1/28: Patient obtunded, hypotensive with a blood pressure of 70s and 80s not responsive to fluids and midodrine.  Transfer to ICU     Assessment & Plan   Active Problems:   Acute renal failure (ARF) (HCC)   Acute metabolic encephalopathy likely due to severe anion gap metabolic acidosis - due to renal failure.   Give 3 Amps of bicarb and albumin Patient received Norco last night.  Will give Narcan once  Acute renal failure superimposed on CKD stage IIIb -present on admission, likely due to hypovolemia with poor p.o. intake, nausea vomiting, and taking usual medications including diuretic and antihypertensive. --Nephrology is consulted --Patient will need urgent dialysis --Patient will likely get temporary catheter for emergent dialysis first --N.p.o. for now for procedure --Strict I/O's --Avoid nephrotoxins and hypotension, maintain MAP over 65 --Minimize fluids as much as possible given pericardial effusion,  and patient anuric --Renally dose meds as indicated  Hypotension -present on admission, likely due to hypovolemia and severe metabolic acidosis in the setting of nausea vomiting.  Little improvement with IV fluids that were given.  Avoiding fluids as much as possible given pericardial effusion seen on echo. --Continue low-dose midodrine --Maintain MAP greater than 65 -Transferring him to ICU as he may need pressors if blood pressure does not improve  Pericardial effusion -most likely due to uremia.  Cardiology do not feel any need for pericardiocentesis.  No signs of tamponade on echo. --Monitor hemodynamics --Limit fluids as much possible  Chronic HFrEF - patient currently hypovolemic and hypotensive.  Trace of any lower extremity edema.  BNP elevated at 980.9, chest x-ray with interstitial prominence, but does not clinically appear decompensated at this time. Echo this admission shows EF 40 to 45%, global LV hypokinesis, moderate LVH, small pericardial effusion without evidence of cardiac tamponade. --Hold Lasix, Coreg, hydralazine, losartan due to hypotension --Monitor volume status closely with strict I/O's and daily weights Net IO Since Admission: 2,723.31 mL [06/20/20 1454]  Type 1 diabetes -Home regimen appears to be Tresiba 10 units at bedtime, NovoLog 3 units TID WC.  Last A1c in December 2019 was 5.9, well controlled.   --Sliding scale NovoLog for now --Hemoglobin A1c of 7.4 --Titrate insulin as needed for inpatient goal 140-180  Hypotension with a history of essential hypertension -presented hypotensive.  Antihypertensives are on hold. --Monitor BP and resume meds when indicated start pressors if needed -Transfer to ICU for close monitoring  Liver mass - seen on renal ultrasound 1/27.   "2.1 cm solid echogenic mass in the right hepatic lobe not clearly  identified on prior ultrasound, question hemangioma. When the patient is clinically stable and able to follow directions  and hold their breath (preferably as an outpatient) further evaluation with dedicated abdominal MRI should be considered." --Outpatient MRI, PCP to schedule  Obesity: Body mass index is 29.48 kg/m.  Complicates overall care and prognosis.  Patient is critically sick and high risk for cardiorespiratory failure multiorgan failure and death.  He will need very close monitoring in ICU and will require temp cath soon for emergent dialysis.  May require pressors depending on his blood pressure.  This was discussed with ICU attending, nursing and nephrology  DVT prophylaxis: heparin injection 5,000 Units Start: 06/18/20 2200   Diet:  Diet Orders (From admission, onward)    Start     Ordered   06/20/20 0001  Diet NPO time specified Except for: Sips with Meds  Diet effective midnight       Question:  Except for  Answer:  Ferrel Logan with Meds   06/19/20 2242            Code Status: Full Code    Subjective 06/20/20   Patient obtunded, hypotensive.  Wife at bedside Disposition Plan & Communication   Status is: Inpatient  Remains inpatient appropriate because:Inpatient level of care appropriate due to severity of illness.  Renal failure and ongoing hypotension.  Being started on dialysis.   Dispo: The patient is from: Home              Anticipated d/c is to: Home              Anticipated d/c date is: 3 days              Patient currently is not medically stable to d/c.   Difficult to place patient No   Family Communication: wife at bedside on rounds today, 1/27    Consults, Procedures, Significant Events   Consultants:   Nephrology  Vascular surgery  General surgery  Cardiology  Procedures:   None  Antimicrobials:  Anti-infectives (From admission, onward)   None         Objective   Vitals:   06/20/20 1021 06/20/20 1152 06/20/20 1201 06/20/20 1217  BP: (!) 89/48 (!) 71/58 (!) 81/60 (!) 92/53  Pulse: 80 82 81 81  Resp:      Temp:      TempSrc:      SpO2: 98% 98%  98% 97%  Weight:      Height:        Intake/Output Summary (Last 24 hours) at 06/20/2020 1450 Last data filed at 06/20/2020 1227 Gross per 24 hour  Intake 723.31 ml  Output 0 ml  Net 723.31 ml   Filed Weights   06/18/20 1301 06/18/20 1526 06/20/20 0750  Weight: 101.6 kg 103 kg 98.6 kg    Physical Exam:  General exam: Obtunded Respiratory system: CTAB, no wheezes, rales or rhonchi, decreased respiratory effort. Cardiovascular system: normal S1/S2, RRR, trace LE edema.   Gastrointestinal system: soft, NT, ND Central nervous system:   Barely arousable to deep sternal rub, nonfocal exam Extremities: moves all, no edema, normal tone Psychiatry: Unable to assess due to obtunded mental state.  Labs   Data Reviewed: I have personally reviewed following labs and imaging studies  CBC: Recent Labs  Lab 06/18/20 1334 06/18/20 1616 06/19/20 0835  WBC 5.3 4.9 6.2  NEUTROABS 4.2  --   --   HGB 12.1* 12.1* 12.6*  HCT 35.5* 35.5* 36.7*  MCV  90.1 90.1 90.8  PLT 222 211 [id number]   Basic Metabolic Panel: Recent Labs  Lab 06/18/20 1334 06/18/20 1616 06/18/20 2041 06/19/20 0835 06/20/20 0334  NA 132*  --   --  134* 138  K 4.5  --   --  4.3 4.8  CL 107  --   --  109 112*  CO2 8*  --   --  9* 9*  GLUCOSE 167*  --   --  112* 80  BUN 125*  --   --  131* 125*  CREATININE 16.42* 15.83*  --  16.80* 18.30*  CALCIUM 7.7*  --   --  7.4* 7.6*  MG  --   --   --  2.3  --   PHOS  --   --  9.3*  --   --    GFR: Estimated Creatinine Clearance: 6.7 mL/min (A) (by C-G formula based on SCr of 18.3 mg/dL (H)). Liver Function Tests: Recent Labs  Lab 06/18/20 1334  AST 6*  ALT 12  ALKPHOS 78  BILITOT 1.2  PROT 6.2*  ALBUMIN 2.6*   No results for input(s): LIPASE, AMYLASE in the last 168 hours. No results for input(s): AMMONIA in the last 168 hours. Coagulation Profile: Recent Labs  Lab 06/18/20 1334  INR 1.2   Cardiac Enzymes: Recent Labs  Lab 06/18/20 1508  CKTOTAL 397   BNP  (last 3 results) No results for input(s): PROBNP in the last 8760 hours. HbA1C: Recent Labs    06/19/20 0835  HGBA1C 7.4*   CBG: Recent Labs  Lab 06/19/20 2127 06/19/20 2150 06/20/20 0406 06/20/20 0820 06/20/20 1157  GLUCAP 101* 90 88 86 91   Lipid Profile: No results for input(s): CHOL, HDL, LDLCALC, TRIG, CHOLHDL, LDLDIRECT in the last 72 hours. Thyroid Function Tests: No results for input(s): TSH, T4TOTAL, FREET4, T3FREE, THYROIDAB in the last 72 hours. Anemia Panel: Recent Labs    06/18/20 2041  VITAMINB12 1,471*  FOLATE 18.8  FERRITIN 796*  TIBC 178*  IRON 130   Sepsis Labs: Recent Labs  Lab 06/18/20 1339 06/18/20 1508  LATICACIDVEN 0.9 0.6    Recent Results (from the past 240 hour(s))  Blood culture (routine single)     Status: None (Preliminary result)   Collection Time: 06/18/20  3:08 PM   Specimen: BLOOD  Result Value Ref Range Status   Specimen Description BLOOD BLOOD LEFT ARM  Final   Special Requests   Final    BOTTLES DRAWN AEROBIC AND ANAEROBIC Blood Culture results may not be optimal due to an inadequate volume of blood received in culture bottles   Culture   Final    NO GROWTH 2 DAYS Performed at [facility], [address], [place], [place] [address]    Report Status PENDING  Incomplete  SARS Coronavirus 2 by RT PCR (hospital order, performed in [place] hospital lab) Nasopharyngeal Nasopharyngeal Swab     Status: None   Collection Time: 06/18/20  3:08 PM   Specimen: Nasopharyngeal Swab  Result Value Ref Range Status   SARS Coronavirus 2 NEGATIVE NEGATIVE Final    Comment: (NOTE) SARS-CoV-2 target nucleic acids are NOT DETECTED.  The SARS-CoV-2 RNA is generally detectable in upper and lower respiratory specimens during the acute phase of infection. The lowest concentration of SARS-CoV-2 viral copies this assay can detect is 250 copies / mL. A negative result does not preclude SARS-CoV-2 infection and should not be used as  the sole basis for treatment or  other patient management decisions.  A negative result may occur with improper specimen collection / handling, submission of specimen other than nasopharyngeal swab, presence of viral mutation(s) within the areas targeted by this assay, and inadequate number of viral copies (<250 copies / mL). A negative result must be combined with clinical observations, patient history, and epidemiological information.  Fact Sheet for Patients:   StrictlyIdeas.no  Fact Sheet for Healthcare Providers: BankingDealers.co.za  This test is not yet approved or  cleared by the [place] FDA and has been authorized for detection and/or diagnosis of SARS-CoV-2 by FDA under an Emergency Use Authorization (EUA).  This EUA will remain in effect (meaning this test can be used) for the duration of the COVID-19 declaration under Section 564(b)(1) of the Act, 21 U.S.C. section 360bbb-3(b)(1), unless the authorization is terminated or revoked sooner.  Performed at [facility], [address] [place], [place] [address]   MRSA PCR Screening     Status: None   Collection Time: 06/20/20  1:19 PM   Specimen: Nasal Mucosa; Nasopharyngeal  Result Value Ref Range Status   MRSA by PCR NEGATIVE NEGATIVE Final    Comment:        The GeneXpert MRSA Assay (FDA approved for NASAL specimens only), is one component of a comprehensive MRSA colonization surveillance program. It is not intended to diagnose MRSA infection nor to guide or monitor treatment for MRSA infections. Performed at [facility], [address] [place],  [address]       Imaging Studies   US Renal  Result Date: 06/27/2020 CLINICAL DATA:  Acute renal failure EXAM: RENAL / URINARY TRACT ULTRASOUND COMPLETE COMPARISON:  05/23/2018 FINDINGS: Right Kidney: Renal measurements: 12.7 x 5.5 x 6.1 cm = volume: 224 mL. Echogenic cortex. No  hydronephrosis or mass Left Kidney: Renal measurements: 13.3 x 6.6 x 5.9 cm = volume: 270 mL. Echogenic cortex. No hydronephrosis or mass. Bladder: Appears normal for degree of bladder distention. Other: Echogenic mass within the right hepatic lobe measuring 2.1 x 2 x 1.9 cm, not seen on prior ultrasound IMPRESSION: 1. Echogenic kidneys bilaterally consistent with medical renal disease. No hydronephrosis. 2. 2.1 cm solid echogenic mass in the right hepatic lobe not clearly identified on prior ultrasound, question hemangioma. When the patient is clinically stable and able to follow directions and hold their breath (preferably as an outpatient) further evaluation with dedicated abdominal MRI should be considered. Electronically Signed   By: Donavan Foil M.D.   On: June 27, 2020 18:06   ECHOCARDIOGRAM COMPLETE  Result Date: 06/19/2020    ECHOCARDIOGRAM REPORT   Patient Name:   DELAINE SAUBER Date of Exam: 06/19/2020 Medical Rec #:  [id number]        Height:       72.0 in Accession #:    [id number]       Weight:       227.0 lb Date of Birth:  28-Nov-1985         BSA:          2.248 m Patient Age:    35 years         BP:           80/49 mmHg Patient Gender: M                HR:           64 bpm. Exam Location:  [facility] Procedure: 2D Echo, Color Doppler, Cardiac Doppler and Strain Analysis Indications:  [id number] CHF-Acute Systolic  History:         Patient has prior history of Echocardiogram examinations.                  HFpEF, CKD; Risk Factors:Diabetes and Hypertension.  Sonographer:     Charmayne Sheer RDCS (AE) Referring Phys:  [id number] Claiborne Billings A GRIFFITH Diagnosing Phys: Yolonda Kida MD  Sonographer Comments: Global longitudinal strain was attempted. IMPRESSIONS  1. Left ventricular ejection fraction, by estimation, is 40 to 45%. The left ventricle has mildly decreased function. The left ventricle demonstrates global hypokinesis. The left ventricular internal cavity size was moderately dilated. There is moderate   concentric left ventricular hypertrophy. Left ventricular diastolic parameters were normal. There is the interventricular septum is flattened in diastole ('D' shaped left ventricle), consistent with right ventricular volume overload.  2. Right ventricular systolic function is moderately reduced. The right ventricular size is moderately enlarged.  3. A small pericardial effusion is present. The pericardial effusion is posterior to the left ventricle and circumferential. There is no evidence of cardiac tamponade.  4. The mitral valve is grossly normal. Trivial mitral valve regurgitation.  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  6. The aortic valve is grossly normal. Aortic valve regurgitation is not visualized. FINDINGS  Left Ventricle: Left ventricular ejection fraction, by estimation, is 40 to 45%. The left ventricle has mildly decreased function. The left ventricle demonstrates global hypokinesis. The left ventricular internal cavity size was moderately dilated. There is moderate concentric left ventricular hypertrophy. The interventricular septum is flattened in diastole ('D' shaped left ventricle), consistent with right ventricular volume overload. Left ventricular diastolic parameters were normal. Right Ventricle: The right ventricular size is moderately enlarged. No increase in right ventricular wall thickness. Right ventricular systolic function is moderately reduced. Left Atrium: Left atrial size was normal in size. Right Atrium: Right atrial size was normal in size. Pericardium: A small pericardial effusion is present. The pericardial effusion is posterior to the left ventricle and circumferential. There is no evidence of cardiac tamponade. Mitral Valve: The mitral valve is grossly normal. Trivial mitral valve regurgitation. MV peak gradient, 3.8 mmHg. The mean mitral valve gradient is 2.0 mmHg. Tricuspid Valve: The tricuspid valve is myxomatous. Tricuspid valve regurgitation is mild. Aortic Valve: The aortic  valve is grossly normal. Aortic valve regurgitation is not visualized. Aortic valve mean gradient measures 3.0 mmHg. Aortic valve peak gradient measures 4.9 mmHg. Aortic valve area, by VTI measures 2.67 cm. Pulmonic Valve: The pulmonic valve was normal in structure. Pulmonic valve regurgitation is not visualized. Aorta: The aortic arch was not well visualized. IAS/Shunts: No atrial level shunt detected by color flow Doppler.  LEFT VENTRICLE PLAX 2D LVIDd:         6.10 cm      Diastology LVIDs:         3.70 cm      LV e' medial:    7.40 cm/s LV PW:         1.30 cm      LV E/e' medial:  11.5 LV IVS:        1.00 cm      LV e' lateral:   5.66 cm/s LVOT diam:     2.30 cm      LV E/e' lateral: 15.0 LV SV:         55 LV SV Index:   25 LVOT Area:     4.15 cm  LV Volumes (MOD) LV vol d, MOD A2C: 176.0  ml LV vol d, MOD A4C: 162.0 ml LV vol s, MOD A2C: 94.8 ml LV vol s, MOD A4C: 70.6 ml LV SV MOD A2C:     81.2 ml LV SV MOD A4C:     162.0 ml LV SV MOD BP:      85.5 ml RIGHT VENTRICLE RV Basal diam:  4.10 cm RV Mid diam:    5.40 cm LEFT ATRIUM             Index       RIGHT ATRIUM           Index LA diam:        3.70 cm 1.65 cm/m  RA Area:     22.20 cm LA Vol (A2C):   59.0 ml 26.24 ml/m RA Volume:   64.20 ml  28.55 ml/m LA Vol (A4C):   65.5 ml 29.13 ml/m LA Biplane Vol: 64.8 ml 28.82 ml/m  AORTIC VALVE                   PULMONIC VALVE AV Area (Vmax):    2.54 cm    PV Vmax:       1.11 m/s AV Area (Vmean):   2.46 cm    PV Vmean:      75.300 cm/s AV Area (VTI):     2.67 cm    PV VTI:        0.228 m AV Vmax:           111.00 cm/s PV Peak grad:  4.9 mmHg AV Vmean:          77.900 cm/s PV Mean grad:  3.0 mmHg AV VTI:            0.207 m AV Peak Grad:      4.9 mmHg AV Mean Grad:      3.0 mmHg LVOT Vmax:         67.90 cm/s LVOT Vmean:        46.100 cm/s LVOT VTI:          0.133 m LVOT/AV VTI ratio: 0.64  AORTA Ao Root diam: 3.80 cm MITRAL VALVE MV Area (PHT): 3.24 cm    SHUNTS MV Area VTI:   2.08 cm    Systemic VTI:  0.13 m  MV Peak grad:  3.8 mmHg    Systemic Diam: 2.30 cm MV Mean grad:  2.0 mmHg MV Vmax:       0.98 m/s MV Vmean:      61.7 cm/s MV Decel Time: 234 msec MV E velocity: 84.80 cm/s MV A velocity: 45.80 cm/s MV E/A ratio:  1.85 Dwayne D Callwood MD Electronically signed by Yolonda Kida MD Signature Date/Time: 06/19/2020/11:26:37 AM    Final      Medications   Scheduled Meds: . Chlorhexidine Gluconate Cloth  6 each Topical Daily  . heparin  5,000 Units Subcutaneous Q8H  . insulin aspart  0-5 Units Subcutaneous QHS  . insulin aspart  0-9 Units Subcutaneous TID WC  . midazolam  2 mg Intravenous Once  . midodrine  10 mg Oral TID WC  . naloxone      . sodium bicarbonate       Continuous Infusions:     LOS: 2 days    Time spent: 30 minutes with > 50% spent in coordination of care and at bedside.    Marae Cottrell Manuella Ghazi, DO [facility]  06/20/2020, 2:50 PM    If 7PM-7AM, please contact night-coverage. How to contact  the [facility] Attending or Consulting provider [place] or covering provider during after hours [place], for this patient?    1. Check the care team in [facility] and look for a) attending/consulting [facility] provider listed and b) the [facility] team listed 2. Log into [email or web address] and use [place]'s universal password to access. If you do not have the password, please contact the hospital operator. 3. Locate the [facility] provider you are looking for under [facility] and page to a number that you can be directly reached. 4. If you still have difficulty reaching the provider, please page the [facility] (Director on Call) for the Hospitalists listed on amion for assistance.

## 2020-06-20 NOTE — Progress Notes (Signed)
Called bedside to assess patient by care RN, Beverlee Nims. Patient lethargic, moaning when touched. Able to follow simple intermittent commands with repeated stimulation. Some minimal verbal responses with persistent questioning. L pupil +3 and brisk, R pupil +4 and sluggish- R eye also appears photophobic. Patient rigid and slightly contracted in BUE, unable to squeeze hands or lift arms. BLE also stiff, wiggles toes with difficulty. Head movement does not appear rigid on exam, Brudzinski's neck sign negative. Kernig's sign positive. Patient afebrile, required pressors earlier- currently hemodynamically stable on room air. Chart review shows the patient has had progressing generalized myalgias since admission. Ambulatory at baseline. Lactic initially normal on admission labs. First HD session this afternoon- no fluid removal per report.  P: Septic work up/ Neurological decline - concerns of possible Meningitis - STAT CT head wo contrast d/t neuro deficits - STAT labs: CMP, Mg, Phos, lactic, PCT, ammonia  Will continue to monitor patient closely.   [name], AGACNP-BC Acute Care Nurse Practitioner [facility]   [phone] / [phone] Please see Amion for pager details.

## 2020-06-20 NOTE — Procedures (Signed)
Central Venous Catheter Insertion Procedure Note  Adrian Evans  [id number]  08-18-1985  Date:06/20/20  Time:3:43 PM   Provider Performing:Blessen Kimbrough [name]   Procedure: Insertion of Non-tunneled Central Venous Catheter(36556)with US guidance [id number])    Indication(s) Hemodialysis  Consent Risks of the procedure as well as the alternatives and risks of each were explained to the patient and/or caregiver.  Consent for the procedure was obtained and is signed in the bedside chart  Anesthesia Versed 2 mg IV Topical with 1% lidocaine   Timeout Verified patient identification, verified procedure, site/side was marked, verified correct patient position, special equipment/implants available, medications/allergies/relevant history reviewed, required imaging and test results available.  Sterile Technique Maximal sterile technique including full sterile barrier drape, hand hygiene, sterile gown, sterile gloves, mask, hair covering, sterile ultrasound probe cover (if used).  Procedure Description Area of catheter insertion was cleaned with chlorhexidine and draped in sterile fashion.   With real-time ultrasound guidance a HD catheter was placed into the right internal jugular vein.  Nonpulsatile blood flow and easy flushing noted in all ports.  The catheter was sutured in place and sterile dressing applied.  Complications/Tolerance None; patient tolerated the procedure well. Chest X-ray is ordered to verify placement for internal jugular or subclavian cannulation.  Chest x-ray is not ordered for femoral cannulation.  EBL Minimal  Specimen(s) None  Bennie Pierini, MD 06/20/20 3:43 PM

## 2020-06-20 NOTE — Progress Notes (Signed)
Patient transferred to ICU 19.   Fuller Mandril, RN

## 2020-06-21 ENCOUNTER — Inpatient Hospital Stay: Payer: BC Managed Care – PPO

## 2020-06-21 ENCOUNTER — Inpatient Hospital Stay (HOSPITAL_COMMUNITY): Payer: BC Managed Care – PPO

## 2020-06-21 ENCOUNTER — Inpatient Hospital Stay (HOSPITAL_COMMUNITY)
Admission: AD | Admit: 2020-06-21 | Discharge: 2020-07-04 | DRG: 064 | Disposition: A | Discharge status: Home / Self Care | Payer: BC Managed Care – PPO | Source: Other Acute Inpatient Hospital | Attending: Internal Medicine | Admitting: Internal Medicine

## 2020-06-21 DIAGNOSIS — N1832 Chronic kidney disease, stage 3b: Secondary | ICD-10-CM | POA: Diagnosis present

## 2020-06-21 DIAGNOSIS — I10 Essential (primary) hypertension: Secondary | ICD-10-CM | POA: Diagnosis present

## 2020-06-21 DIAGNOSIS — R471 Dysarthria and anarthria: Secondary | ICD-10-CM | POA: Diagnosis present

## 2020-06-21 DIAGNOSIS — D631 Anemia in chronic kidney disease: Secondary | ICD-10-CM | POA: Diagnosis present

## 2020-06-21 DIAGNOSIS — R571 Hypovolemic shock: Secondary | ICD-10-CM | POA: Diagnosis present

## 2020-06-21 DIAGNOSIS — E109 Type 1 diabetes mellitus without complications: Secondary | ICD-10-CM

## 2020-06-21 DIAGNOSIS — I6389 Other cerebral infarction: Secondary | ICD-10-CM | POA: Diagnosis present

## 2020-06-21 DIAGNOSIS — R4182 Altered mental status, unspecified: Secondary | ICD-10-CM

## 2020-06-21 DIAGNOSIS — G9349 Other encephalopathy: Secondary | ICD-10-CM | POA: Diagnosis present

## 2020-06-21 DIAGNOSIS — R4701 Aphasia: Secondary | ICD-10-CM | POA: Diagnosis present

## 2020-06-21 DIAGNOSIS — E1022 Type 1 diabetes mellitus with diabetic chronic kidney disease: Secondary | ICD-10-CM | POA: Diagnosis present

## 2020-06-21 DIAGNOSIS — I5043 Acute on chronic combined systolic (congestive) and diastolic (congestive) heart failure: Secondary | ICD-10-CM | POA: Diagnosis present

## 2020-06-21 DIAGNOSIS — E10319 Type 1 diabetes mellitus with unspecified diabetic retinopathy without macular edema: Secondary | ICD-10-CM | POA: Diagnosis present

## 2020-06-21 DIAGNOSIS — Z20822 Contact with and (suspected) exposure to covid-19: Secondary | ICD-10-CM | POA: Diagnosis present

## 2020-06-21 DIAGNOSIS — R339 Retention of urine, unspecified: Secondary | ICD-10-CM | POA: Diagnosis present

## 2020-06-21 DIAGNOSIS — N2581 Secondary hyperparathyroidism of renal origin: Secondary | ICD-10-CM | POA: Diagnosis present

## 2020-06-21 DIAGNOSIS — J9602 Acute respiratory failure with hypercapnia: Secondary | ICD-10-CM | POA: Diagnosis not present

## 2020-06-21 DIAGNOSIS — R569 Unspecified convulsions: Secondary | ICD-10-CM | POA: Diagnosis present

## 2020-06-21 DIAGNOSIS — N183 Chronic kidney disease, stage 3 unspecified: Secondary | ICD-10-CM

## 2020-06-21 DIAGNOSIS — J9601 Acute respiratory failure with hypoxia: Secondary | ICD-10-CM | POA: Diagnosis present

## 2020-06-21 DIAGNOSIS — I69391 Dysphagia following cerebral infarction: Secondary | ICD-10-CM | POA: Diagnosis not present

## 2020-06-21 DIAGNOSIS — Z794 Long term (current) use of insulin: Secondary | ICD-10-CM

## 2020-06-21 DIAGNOSIS — R1310 Dysphagia, unspecified: Secondary | ICD-10-CM | POA: Diagnosis present

## 2020-06-21 DIAGNOSIS — G9341 Metabolic encephalopathy: Secondary | ICD-10-CM | POA: Diagnosis present

## 2020-06-21 DIAGNOSIS — E1059 Type 1 diabetes mellitus with other circulatory complications: Secondary | ICD-10-CM | POA: Diagnosis not present

## 2020-06-21 DIAGNOSIS — R579 Shock, unspecified: Secondary | ICD-10-CM | POA: Diagnosis not present

## 2020-06-21 DIAGNOSIS — N185 Chronic kidney disease, stage 5: Secondary | ICD-10-CM | POA: Diagnosis not present

## 2020-06-21 DIAGNOSIS — E872 Acidosis: Secondary | ICD-10-CM | POA: Diagnosis present

## 2020-06-21 DIAGNOSIS — I5022 Chronic systolic (congestive) heart failure: Secondary | ICD-10-CM | POA: Diagnosis not present

## 2020-06-21 DIAGNOSIS — R34 Anuria and oliguria: Secondary | ICD-10-CM | POA: Diagnosis not present

## 2020-06-21 DIAGNOSIS — I5023 Acute on chronic systolic (congestive) heart failure: Secondary | ICD-10-CM | POA: Diagnosis not present

## 2020-06-21 DIAGNOSIS — N170 Acute kidney failure with tubular necrosis: Secondary | ICD-10-CM | POA: Diagnosis present

## 2020-06-21 DIAGNOSIS — Z23 Encounter for immunization: Secondary | ICD-10-CM | POA: Diagnosis not present

## 2020-06-21 DIAGNOSIS — G9340 Encephalopathy, unspecified: Secondary | ICD-10-CM | POA: Diagnosis present

## 2020-06-21 DIAGNOSIS — I639 Cerebral infarction, unspecified: Secondary | ICD-10-CM | POA: Diagnosis not present

## 2020-06-21 DIAGNOSIS — N179 Acute kidney failure, unspecified: Secondary | ICD-10-CM | POA: Diagnosis not present

## 2020-06-21 DIAGNOSIS — G0490 Encephalitis and encephalomyelitis, unspecified: Secondary | ICD-10-CM | POA: Diagnosis present

## 2020-06-21 DIAGNOSIS — H5702 Anisocoria: Secondary | ICD-10-CM | POA: Diagnosis present

## 2020-06-21 DIAGNOSIS — N19 Unspecified kidney failure: Secondary | ICD-10-CM | POA: Diagnosis not present

## 2020-06-21 DIAGNOSIS — Z4659 Encounter for fitting and adjustment of other gastrointestinal appliance and device: Secondary | ICD-10-CM

## 2020-06-21 DIAGNOSIS — I313 Pericardial effusion (noninflammatory): Secondary | ICD-10-CM | POA: Diagnosis not present

## 2020-06-21 DIAGNOSIS — I340 Nonrheumatic mitral (valve) insufficiency: Secondary | ICD-10-CM | POA: Diagnosis not present

## 2020-06-21 DIAGNOSIS — Z8249 Family history of ischemic heart disease and other diseases of the circulatory system: Secondary | ICD-10-CM

## 2020-06-21 HISTORY — DX: Encephalopathy, unspecified: G93.40

## 2020-06-21 LAB — POCT I-STAT 7, (LYTES, BLD GAS, ICA,H+H)
Acid-base deficit: 9 mmol/L — ABNORMAL HIGH (ref 0.0–2.0)
Acid-base deficit: 9 mmol/L — ABNORMAL HIGH (ref 0.0–2.0)
Bicarbonate: 16.1 mmol/L — ABNORMAL LOW (ref 20.0–28.0)
Bicarbonate: 17.4 mmol/L — ABNORMAL LOW (ref 20.0–28.0)
Calcium, Ion: 1.09 mmol/L — ABNORMAL LOW (ref 1.15–1.40)
Calcium, Ion: 1.14 mmol/L — ABNORMAL LOW (ref 1.15–1.40)
HCT: 33 % — ABNORMAL LOW (ref 39.0–52.0)
HCT: 34 % — ABNORMAL LOW (ref 39.0–52.0)
Hemoglobin: 11.2 g/dL — ABNORMAL LOW (ref 13.0–17.0)
Hemoglobin: 11.6 g/dL — ABNORMAL LOW (ref 13.0–17.0)
O2 Saturation: 100 %
O2 Saturation: 99 %
Patient temperature: 34.6
Patient temperature: 99.8
Potassium: 3.6 mmol/L (ref 3.5–5.1)
Potassium: 3.9 mmol/L (ref 3.5–5.1)
Sodium: 137 mmol/L (ref 135–145)
Sodium: 139 mmol/L (ref 135–145)
TCO2: 17 mmol/L — ABNORMAL LOW (ref 22–32)
TCO2: 19 mmol/L — ABNORMAL LOW (ref 22–32)
pCO2 arterial: 34 mmHg (ref 32.0–48.0)
pCO2 arterial: 36.3 mmHg (ref 32.0–48.0)
pH, Arterial: 7.276 — ABNORMAL LOW (ref 7.350–7.450)
pH, Arterial: 7.286 — ABNORMAL LOW (ref 7.350–7.450)
pO2, Arterial: 124 mmHg — ABNORMAL HIGH (ref 83.0–108.0)
pO2, Arterial: 549 mmHg — ABNORMAL HIGH (ref 83.0–108.0)

## 2020-06-21 LAB — MAGNESIUM
Magnesium: 2.4 mg/dL (ref 1.7–2.4)
Magnesium: 2.2 mg/dL (ref 1.7–2.4)

## 2020-06-21 LAB — MPO/PR-3 (ANCA) ANTIBODIES
ANCA Proteinase 3: <3.5 U/mL (ref 0.0–3.5)
Myeloperoxidase Abs: <9 U/mL (ref 0.0–9.0)

## 2020-06-21 LAB — BASIC METABOLIC PANEL
Anion gap: 17 — ABNORMAL HIGH (ref 5–15)
BUN: 98 mg/dL — ABNORMAL HIGH (ref 6–20)
CO2: 14 mmol/L — ABNORMAL LOW (ref 22–32)
Calcium: 7.8 mg/dL — ABNORMAL LOW (ref 8.9–10.3)
Chloride: 109 mmol/L (ref 98–111)
Creatinine, Ser: 14.94 mg/dL — ABNORMAL HIGH (ref 0.61–1.24)
GFR, Estimated: 4 mL/min — ABNORMAL LOW (ref >60)
Glucose, Bld: 107 mg/dL — ABNORMAL HIGH (ref 70–99)
Potassium: 4.2 mmol/L (ref 3.5–5.1)
Sodium: 140 mmol/L (ref 135–145)

## 2020-06-21 LAB — MRSA PCR SCREENING: MRSA by PCR: NEGATIVE

## 2020-06-21 LAB — COMPREHENSIVE METABOLIC PANEL
ALT: 12 U/L (ref 0–44)
AST: 8 U/L — ABNORMAL LOW (ref 15–41)
Albumin: 2.3 g/dL — ABNORMAL LOW (ref 3.5–5.0)
Alkaline Phosphatase: 73 U/L (ref 38–126)
Anion gap: 19 — ABNORMAL HIGH (ref 5–15)
BUN: 103 mg/dL — ABNORMAL HIGH (ref 6–20)
CO2: 13 mmol/L — ABNORMAL LOW (ref 22–32)
Calcium: 7.7 mg/dL — ABNORMAL LOW (ref 8.9–10.3)
Chloride: 104 mmol/L (ref 98–111)
Creatinine, Ser: 15.51 mg/dL — ABNORMAL HIGH (ref 0.61–1.24)
GFR, Estimated: 4 mL/min — ABNORMAL LOW (ref >60)
Glucose, Bld: 147 mg/dL — ABNORMAL HIGH (ref 70–99)
Potassium: 3.7 mmol/L (ref 3.5–5.1)
Sodium: 136 mmol/L (ref 135–145)
Total Bilirubin: 1.8 mg/dL — ABNORMAL HIGH (ref 0.3–1.2)
Total Protein: 5.9 g/dL — ABNORMAL LOW (ref 6.5–8.1)

## 2020-06-21 LAB — PHOSPHORUS
Phosphorus: 7.3 mg/dL — ABNORMAL HIGH (ref 2.5–4.6)
Phosphorus: 7.4 mg/dL — ABNORMAL HIGH (ref 2.5–4.6)

## 2020-06-21 LAB — LIPASE, BLOOD: Lipase: 22 U/L (ref 11–51)

## 2020-06-21 LAB — CBC
HCT: 35.8 % — ABNORMAL LOW (ref 39.0–52.0)
Hemoglobin: 12.8 g/dL — ABNORMAL LOW (ref 13.0–17.0)
MCH: 31.3 pg (ref 26.0–34.0)
MCHC: 35.8 g/dL (ref 30.0–36.0)
MCV: 87.5 fL (ref 80.0–100.0)
Platelets: 351 K/uL (ref 150–400)
RBC: 4.09 MIL/uL — ABNORMAL LOW (ref 4.22–5.81)
RDW: 13.1 % (ref 11.5–15.5)
WBC: 29.8 K/uL — ABNORMAL HIGH (ref 4.0–10.5)
nRBC: 0.1 % (ref 0.0–0.2)

## 2020-06-21 LAB — QUANTIFERON-TB GOLD PLUS (RQFGPL)
QuantiFERON Mitogen Value: 1.52 IU/mL
QuantiFERON Nil Value: 0 IU/mL
QuantiFERON TB1 Ag Value: 0 IU/mL
QuantiFERON TB2 Ag Value: 0 IU/mL

## 2020-06-21 LAB — GLUCOSE, CAPILLARY
Glucose-Capillary: 109 mg/dL — ABNORMAL HIGH (ref 70–99)
Glucose-Capillary: 142 mg/dL — ABNORMAL HIGH (ref 70–99)
Glucose-Capillary: 145 mg/dL — ABNORMAL HIGH (ref 70–99)
Glucose-Capillary: 125 mg/dL — ABNORMAL HIGH (ref 70–99)

## 2020-06-21 LAB — QUANTIFERON-TB GOLD PLUS: QuantiFERON-TB Gold Plus: NEGATIVE

## 2020-06-21 LAB — AMYLASE: Amylase: 46 U/L (ref 28–100)

## 2020-06-21 LAB — VITAMIN B12: Vitamin B-12: 1095 pg/mL — ABNORMAL HIGH (ref 180–914)

## 2020-06-21 LAB — PROCALCITONIN
Procalcitonin: 4.27 ng/mL
Procalcitonin: 5.59 ng/mL

## 2020-06-21 LAB — FOLATE: Folate: 18.6 ng/mL (ref >5.9)

## 2020-06-21 MED ORDER — SODIUM CHLORIDE 0.9 % IV SOLN
2.0000 g | Freq: Two times a day (BID) | INTRAVENOUS | Status: DC
  Filled 2020-06-21: qty 2000

## 2020-06-21 MED ORDER — SODIUM CHLORIDE 0.9 % IV SOLN: 250.0000 mL | INTRAVENOUS | 0 refills | Status: DC

## 2020-06-21 MED ORDER — POLYETHYLENE GLYCOL 3350 17 G PO PACK: 17.0000 g | PACK | Freq: Every day | ORAL | Status: DC | PRN

## 2020-06-21 MED ORDER — DEXTROSE 5 % IV SOLN
5.0000 mg/kg | INTRAVENOUS | Status: DC
  Filled 2020-06-21: qty 9.9

## 2020-06-21 MED ORDER — SODIUM CHLORIDE 0.9 % IV SOLN
2.0000 g | Freq: Two times a day (BID) | INTRAVENOUS | Status: DC
  Administered 2020-06-21: 2 g via INTRAVENOUS
  Filled 2020-06-21: qty 2
  Filled 2020-06-21: qty 20

## 2020-06-21 MED ORDER — LORAZEPAM 2 MG/ML IJ SOLN
INTRAMUSCULAR | Status: AC
  Administered 2020-06-21: 2 mg
  Filled 2020-06-21: qty 1

## 2020-06-21 MED ORDER — ACETAMINOPHEN 325 MG PO TABS: 650.0000 mg | ORAL_TABLET | Freq: Four times a day (QID) | ORAL | 0 refills | Status: DC | PRN

## 2020-06-21 MED ORDER — NOREPINEPHRINE 16 MG/250ML-% IV SOLN: 2.0000 ug/min | INTRAVENOUS | Status: DC

## 2020-06-21 MED ORDER — SODIUM CHLORIDE 0.9 % IV SOLN: 250.0000 mL | INTRAVENOUS | Status: DC

## 2020-06-21 MED ORDER — VANCOMYCIN HCL 500 MG/100ML IV SOLN
500.0000 mg | Freq: Once | INTRAVENOUS | Status: AC
  Administered 2020-06-21: 500 mg via INTRAVENOUS
  Filled 2020-06-21: qty 100

## 2020-06-21 MED ORDER — VANCOMYCIN HCL 2000 MG/400ML IV SOLN
2000.0000 mg | Freq: Once | INTRAVENOUS | Status: AC
  Administered 2020-06-21: 2000 mg via INTRAVENOUS
  Filled 2020-06-21: qty 400

## 2020-06-21 MED ORDER — MIDODRINE HCL 10 MG PO TABS: 10.0000 mg | ORAL_TABLET | Freq: Three times a day (TID) | ORAL | 0 refills | Status: DC

## 2020-06-21 MED ORDER — PROPOFOL 1000 MG/100ML IV EMUL
INTRAVENOUS | Status: AC
  Filled 2020-06-21: qty 100

## 2020-06-21 MED ORDER — SODIUM CHLORIDE 0.9 % FOR CRRT: INTRAVENOUS_CENTRAL | Status: DC | PRN

## 2020-06-21 MED ORDER — PRISMASOL BGK 4/2.5 32-4-2.5 MEQ/L REPLACEMENT SOLN
Status: DC
  Filled 2020-06-21 (×4): qty 5000

## 2020-06-21 MED ORDER — SODIUM CHLORIDE 0.9 % IV SOLN: INTRAVENOUS | Status: DC

## 2020-06-21 MED ORDER — CHLORHEXIDINE GLUCONATE CLOTH 2 % EX PADS
6.0000 | MEDICATED_PAD | Freq: Every day | CUTANEOUS | Status: DC
  Administered 2020-06-21 – 2020-06-25 (×5): 6 via TOPICAL

## 2020-06-21 MED ORDER — CHLORHEXIDINE GLUCONATE 0.12% ORAL RINSE (MEDLINE KIT)
15.0000 mL | Freq: Two times a day (BID) | OROMUCOSAL | Status: DC
  Administered 2020-06-21 – 2020-06-24 (×6): 15 mL via OROMUCOSAL

## 2020-06-21 MED ORDER — BISACODYL 5 MG PO TBEC: 5.0000 mg | DELAYED_RELEASE_TABLET | Freq: Every day | ORAL | 0 refills | Status: DC | PRN

## 2020-06-21 MED ORDER — NOREPINEPHRINE 16 MG/250ML-% IV SOLN: 0.0000 ug/min | INTRAVENOUS | 0 refills | Status: DC

## 2020-06-21 MED ORDER — LEVETIRACETAM IN NACL 1000 MG/100ML IV SOLN
1000.0000 mg | INTRAVENOUS | Status: DC
  Administered 2020-06-22 – 2020-06-29 (×8): 1000 mg via INTRAVENOUS
  Filled 2020-06-21 (×8): qty 100

## 2020-06-21 MED ORDER — SODIUM BICARBONATE 8.4 % IV SOLN
INTRAVENOUS | Status: DC
  Filled 2020-06-21: qty 850
  Filled 2020-06-21: qty 150

## 2020-06-21 MED ORDER — PROPOFOL 1000 MG/100ML IV EMUL
5.0000 ug/kg/min | INTRAVENOUS | Status: DC
  Administered 2020-06-21 (×2): 20 ug/kg/min via INTRAVENOUS
  Administered 2020-06-22: 30 ug/kg/min via INTRAVENOUS
  Administered 2020-06-22: 25 ug/kg/min via INTRAVENOUS
  Administered 2020-06-22 – 2020-06-23 (×3): 30 ug/kg/min via INTRAVENOUS
  Administered 2020-06-23: 10 ug/kg/min via INTRAVENOUS
  Administered 2020-06-24: 5 ug/kg/min via INTRAVENOUS
  Filled 2020-06-21 (×10): qty 100

## 2020-06-21 MED ORDER — PRISMASOL BGK 4/2.5 32-4-2.5 MEQ/L REPLACEMENT SOLN
Status: DC
  Filled 2020-06-21 (×7): qty 5000

## 2020-06-21 MED ORDER — INSULIN ASPART 100 UNIT/ML ~~LOC~~ SOLN: 0.0000 [IU] | Freq: Three times a day (TID) | SUBCUTANEOUS | 11 refills | Status: DC

## 2020-06-21 MED ORDER — DEXTROSE 5 % IV SOLN
385.0000 mg | Freq: Two times a day (BID) | INTRAVENOUS | Status: DC
  Administered 2020-06-22 – 2020-06-23 (×3): 385 mg via INTRAVENOUS
  Filled 2020-06-21 (×4): qty 7.7

## 2020-06-21 MED ORDER — ALTEPLASE 2 MG IJ SOLR: 2.0000 mg | Freq: Once | INTRAMUSCULAR | Status: DC | PRN

## 2020-06-21 MED ORDER — LORAZEPAM 2 MG/ML IJ SOLN
1.0000 mg | Freq: Once | INTRAMUSCULAR | Status: AC
  Administered 2020-06-21: 1 mg via INTRAVENOUS

## 2020-06-21 MED ORDER — SODIUM CHLORIDE 0.9 % IV SOLN
2.0000 g | Freq: Three times a day (TID) | INTRAVENOUS | Status: DC
  Administered 2020-06-21 – 2020-06-25 (×11): 2 g via INTRAVENOUS
  Filled 2020-06-21 (×7): qty 2
  Filled 2020-06-21: qty 2000
  Filled 2020-06-21 (×2): qty 2
  Filled 2020-06-21: qty 2000
  Filled 2020-06-21 (×2): qty 2

## 2020-06-21 MED ORDER — VANCOMYCIN VARIABLE DOSE PER UNSTABLE RENAL FUNCTION (PHARMACIST DOSING): Status: DC

## 2020-06-21 MED ORDER — HEPARIN SODIUM (PORCINE) 1000 UNIT/ML DIALYSIS
1000.0000 [IU] | INTRAMUSCULAR | Status: DC | PRN
  Administered 2020-06-24: 2800 [IU] via INTRAVENOUS_CENTRAL
  Filled 2020-06-21: qty 6
  Filled 2020-06-21: qty 2
  Filled 2020-06-21: qty 6

## 2020-06-21 MED ORDER — ONDANSETRON HCL 4 MG/2ML IJ SOLN: 4.0000 mg | Freq: Four times a day (QID) | INTRAMUSCULAR | Status: DC | PRN

## 2020-06-21 MED ORDER — LEVETIRACETAM IN NACL 500 MG/100ML IV SOLN: 500.0000 mg | INTRAVENOUS | 0 refills | Status: DC

## 2020-06-21 MED ORDER — SODIUM CHLORIDE 0.9 % IV SOLN
2.0000 g | INTRAVENOUS | Status: DC
  Administered 2020-06-21 (×2): 2 g via INTRAVENOUS
  Filled 2020-06-21: qty 2
  Filled 2020-06-21: qty 2000
  Filled 2020-06-21: qty 2
  Filled 2020-06-21 (×2): qty 2000

## 2020-06-21 MED ORDER — ROCURONIUM BROMIDE 10 MG/ML (PF) SYRINGE
PREFILLED_SYRINGE | INTRAVENOUS | Status: AC
  Administered 2020-06-21: 100 mg
  Filled 2020-06-21: qty 10

## 2020-06-21 MED ORDER — INSULIN ASPART 100 UNIT/ML ~~LOC~~ SOLN
0.0000 [IU] | SUBCUTANEOUS | Status: DC
  Administered 2020-06-21 – 2020-06-22 (×2): 1 [IU] via SUBCUTANEOUS
  Administered 2020-06-23: 3 [IU] via SUBCUTANEOUS
  Administered 2020-06-23: 2 [IU] via SUBCUTANEOUS
  Administered 2020-06-24 (×2): 1 [IU] via SUBCUTANEOUS
  Administered 2020-06-24 (×3): 2 [IU] via SUBCUTANEOUS
  Administered 2020-06-25: 1 [IU] via SUBCUTANEOUS
  Administered 2020-06-25 (×2): 2 [IU] via SUBCUTANEOUS
  Administered 2020-06-25: 1 [IU] via SUBCUTANEOUS
  Administered 2020-06-26 (×2): 2 [IU] via SUBCUTANEOUS
  Administered 2020-06-26: 3 [IU] via SUBCUTANEOUS
  Administered 2020-06-26 – 2020-06-27 (×3): 2 [IU] via SUBCUTANEOUS
  Administered 2020-06-27: 1 [IU] via SUBCUTANEOUS
  Administered 2020-06-27: 2 [IU] via SUBCUTANEOUS
  Administered 2020-06-27: 1 [IU] via SUBCUTANEOUS
  Administered 2020-06-27 – 2020-06-28 (×6): 2 [IU] via SUBCUTANEOUS

## 2020-06-21 MED ORDER — SODIUM CHLORIDE 0.9 % IV SOLN
2.0000 g | Freq: Two times a day (BID) | INTRAVENOUS | Status: DC
  Administered 2020-06-22 – 2020-06-26 (×10): 2 g via INTRAVENOUS
  Filled 2020-06-21: qty 2
  Filled 2020-06-21: qty 20
  Filled 2020-06-21 (×2): qty 2
  Filled 2020-06-21 (×2): qty 20
  Filled 2020-06-21 (×5): qty 2

## 2020-06-21 MED ORDER — ORAL CARE MOUTH RINSE
15.0000 mL | Freq: Two times a day (BID) | OROMUCOSAL | Status: DC
  Administered 2020-06-21: 15 mL via OROMUCOSAL

## 2020-06-21 MED ORDER — IOHEXOL 350 MG/ML SOLN
75.0000 mL | Freq: Once | INTRAVENOUS | Status: AC | PRN
  Administered 2020-06-21: 75 mL via INTRAVENOUS

## 2020-06-21 MED ORDER — MAGNESIUM SULFATE 2 GM/50ML IV SOLN
2.0000 g | Freq: Once | INTRAVENOUS | Status: AC
  Administered 2020-06-21: 2 g via INTRAVENOUS
  Filled 2020-06-21: qty 50

## 2020-06-21 MED ORDER — FENTANYL CITRATE (PF) 100 MCG/2ML IJ SOLN
50.0000 ug | INTRAMUSCULAR | Status: DC | PRN
  Administered 2020-06-21 – 2020-06-22 (×4): 50 ug via INTRAVENOUS
  Filled 2020-06-21 (×4): qty 2

## 2020-06-21 MED ORDER — DEXTROSE 5 % IV SOLN
385.0000 mg | INTRAVENOUS | Status: DC
  Administered 2020-06-21: 385 mg via INTRAVENOUS
  Filled 2020-06-21: qty 7.7

## 2020-06-21 MED ORDER — ORAL CARE MOUTH RINSE
15.0000 mL | OROMUCOSAL | Status: DC
  Administered 2020-06-21 – 2020-06-24 (×27): 15 mL via OROMUCOSAL

## 2020-06-21 MED ORDER — INSULIN ASPART 100 UNIT/ML ~~LOC~~ SOLN: 0.0000 [IU] | Freq: Every day | SUBCUTANEOUS | 11 refills | Status: DC

## 2020-06-21 MED ORDER — FAMOTIDINE IN NACL 20-0.9 MG/50ML-% IV SOLN
20.0000 mg | Freq: Every day | INTRAVENOUS | Status: DC
  Administered 2020-06-21 – 2020-06-23 (×3): 20 mg via INTRAVENOUS
  Filled 2020-06-21 (×3): qty 50

## 2020-06-21 MED ORDER — CHLORHEXIDINE GLUCONATE CLOTH 2 % EX PADS: 6.0000 | MEDICATED_PAD | Freq: Every day | CUTANEOUS | 0 refills | Status: DC

## 2020-06-21 MED ORDER — PRISMASOL BGK 4/2.5 32-4-2.5 MEQ/L EC SOLN
Status: DC
  Filled 2020-06-21 (×32): qty 5000

## 2020-06-21 MED ORDER — POTASSIUM CHLORIDE 10 MEQ/50ML IV SOLN
10.0000 meq | INTRAVENOUS | Status: AC
  Administered 2020-06-21 (×4): 10 meq via INTRAVENOUS
  Filled 2020-06-21 (×4): qty 50

## 2020-06-21 MED ORDER — VANCOMYCIN HCL IN DEXTROSE 1-5 GM/200ML-% IV SOLN
1000.0000 mg | INTRAVENOUS | Status: DC
  Administered 2020-06-22 – 2020-06-24 (×3): 1000 mg via INTRAVENOUS
  Filled 2020-06-21 (×3): qty 200

## 2020-06-21 MED ORDER — DOCUSATE SODIUM 100 MG PO CAPS: 100.0000 mg | ORAL_CAPSULE | Freq: Two times a day (BID) | ORAL | Status: DC | PRN

## 2020-06-21 MED ORDER — ETOMIDATE 2 MG/ML IV SOLN
INTRAVENOUS | Status: AC
  Administered 2020-06-21: 20 mg
  Filled 2020-06-21: qty 20

## 2020-06-21 MED ORDER — NOREPINEPHRINE 4 MG/250ML-% IV SOLN: 2.0000 ug/min | INTRAVENOUS | Status: DC

## 2020-06-21 NOTE — Progress Notes (Signed)
[facility] Progress Note Patient Name: Adrian Evans DOB: 12/09/1985 MRN: [id number]   Date of Service  06/21/2020  HPI/Events of Note  ABG on 100%/PRVC 16/TV 620/P 5 = 7.286/34.0/549. Patient is over breathing set ventilator rate at 21.   eICU Interventions  Plan: 1. Decrease FiO2 to 30%. 2. Increase PRVC rate to 25. 3. Repeat ABG at 10:30 PM.     Intervention Category Major Interventions: Acid-Base disturbance - evaluation and management;Respiratory failure - evaluation and management  Lysle Dingwall 06/21/2020, 9:12 PM

## 2020-06-21 NOTE — Consult Note (Addendum)
Neurology Consultation  Reason for Consult: Altered mental status, concern for seizures, transfer from [facility] to [facility] Referring Physician: Dr. Rory Percy  CC: Altered mental status, concern for seizures  History is obtained from: chart review  HPI: Adrian Evans is a 35 y.o. male with a medical history of type 1 diabetes, CKD 3, heart failure with reduced ejection fraction of 20 to 25%, and hypertension who was initially admitted to the hospital on 06/18/2020 for generalized weakness that had started with diffuse myalgias, nausea and vomiting along with nasal congestion and diminished oral intake.  He was being treated and evaluated by medicine as well as nephrology due to AKI with a significant increase in his BUN and creatinine requiring dialysis (baseline creatinine 2.1 - 2.4, creatinine on hospital arrival 15.83). On 1/28, he became more encephalopathic and difficult to arouse.  He also had an episode of seizure-like activity for which she was seen by telemedicine neurology.  He would only responsd to noxious stimulation with moaning and passive motion of his extremities.  He exhibited some rigidity and due to concern for unresponsiveness and seizure-like activity a telemedicine consult was placed. The telemedicine neurologist witnessed left arm shaking and jerking and then spreading to the right arm and to the whole face. MRI and EEG were recommended. Patient had another episode of appearing rigid in bilateral upper extremities unable to squeeze arms and witnessed seizure activity as noted around 8:30 AM for which he received 2 mg of Ativan.  At that time left hand jerking and eye twitching and lip biting were also noted. He was loaded on Keppra and started on a maintenance dose of Keppra. For the leukocytosis and concern for CNS infection, started on meningitic coverage by ICU and transferred to [facility] 06/21/20 for further evaluation and continuous EEG monitoring.   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  He had been sick the week prior with congestion symptoms of upper respiratory infection.  His Covid test was negative. In the hospital, he continued to have worsening mentation.   WUJ:WJXBJY to obtain due to altered mental status.   Past Medical History:  Diagnosis Date  . Diabetes mellitus without complication ([place])    Family History  Problem Relation Age of Onset  . Hypertension Mother    Social History:   reports that he has never smoked. He has never used smokeless tobacco. He reports previous alcohol use. He reports previous drug use.  Medications Current Outpatient Medications  Medication Instructions  . acetaminophen (TYLENOL) 650 mg, Oral, Every 6 hours PRN  . bisacodyl (DULCOLAX) 5 mg, Oral, Daily PRN  . [START ON 06/22/2020] Chlorhexidine Gluconate Cloth 2 % PADS 6 each, Topical, Daily  . insulin aspart (NOVOLOG) 0-9 Units, Subcutaneous, 3 times daily with meals  . insulin aspart (NOVOLOG) 0-5 Units, Subcutaneous, Daily at bedtime  . [START ON 06/22/2020] levETIRAcetam (KEPRRA) 500 mg, Intravenous, Every 24 hours  . midodrine (PROAMATINE) 10 mg, Oral, 3 times daily with meals  . norepinephrine (LEVOPHED) 0-40 mcg/min (0-40 mcg/min), Intravenous, Continuous  . sodium chloride 0.9 % infusion 250 mLs, Intravenous, Continuous   Exam: Current vital signs: BP 103/77 (BP Location: Left Arm)   Pulse 87   Temp 99.8 F (37.7 C) (Axillary)   Resp 16   Wt 94.5 kg   SpO2 100%  BMI 28.26 kg/m  Vital signs in last 24 hours: Temp:  [97.7 F (36.5 C)-99.8 F (37.7 C)] 99.8 F (37.7 C) (01/29 1537) Pulse Rate:  [76-88] 87 (01/29 1625) Resp:  [0-22] 16 (01/29 1625) BP: (92-171)/(57-156) 103/77 (01/29 1537) SpO2:  [96 %-100 %] 100 % (01/29 1625) FiO2 (%):  [50 %] 50 % (01/29 1625) Weight:   [94.5 kg-98.6 kg] 94.5 kg (01/29 1537)  GENERAL: laying in bed, eyes closed, brief eye opening with stimulation HEENT: - Normocephalic and atraumatic, dry mm LUNGS -  Frequent apneic episodes captured on cardiac monitor, patient intubated during initial assessment by PCCM provider for airway protection CV - tachycardiac on cardiac monitor ABDOMEN - Soft, non-distended MSK: Some scoliosis of his back Ext: warm, well perfused  NEURO:  Mental Status: patient yells and states "ouch that hurts" and "everywhere" when asked where but does not provide any further verbalization, answered "yes" when asked if his head hurts . Briefly opens eyes with verbal stimulation. Does not follow commands. Speech mildly dysarthric but unable to formally assess due to limited verbal communication attempt. Cranial Nerves:  II: Pupils unequal, round, briskly reactive. Right pupil 4 mm/brisk, left pupil 2 mm/brisk.  III, IV, VI: Patient does not attend to visual stimuli in all visual fields, unable to assess extraocular movements.  V: Moves jaw back and forth VII: Face appears symmetric resting VIII: Hearing intact to voice IX, X: Phonation intact.  XI: Head appears midline XII: Patient does not protrude tongue to command Motor: Tone is normal, bulk is normal. Bilateral upper extremities are at least 2-3/5 (will not hit face with hands), would not move legs to command and did not resist passive movement although he was in pain.  Sensation- unable to assess but does yell with movement of all extremities.  Coordination: unable to assess  DTRs: 2+ symmetric patellae, achilles, ankles without clonus.  Gait- deferred  Labs I have reviewed labs in epic and the results pertinent to this consultation are: CBC    Component Value Date/Time   WBC 29.8 (H) 06/21/2020 0558   RBC 4.09 (L) 06/21/2020 0558   HGB 12.8 (L) 06/21/2020 0558   HCT 35.8 (L) 06/21/2020 0558   PLT 351 06/21/2020 0558   MCV 87.5 06/21/2020 0558    MCH 31.3 06/21/2020 0558   MCHC 35.8 06/21/2020 0558   RDW 13.1 06/21/2020 0558   LYMPHSABS 0.5 (L) 06/18/2020 1334   MONOABS 0.5 06/18/2020 1334   EOSABS 0.1 06/18/2020 1334   BASOSABS 0.0 06/18/2020 1334   CMP     Component Value Date/Time   NA 140 06/21/2020 0558   K 4.2 06/21/2020 0558   CL 109 06/21/2020 0558   CO2 14 (L) 06/21/2020 0558   GLUCOSE 107 (H) 06/21/2020 0558   BUN 98 (H) 06/21/2020 0558   CREATININE 14.94 (H) 06/21/2020 0558   CALCIUM 7.8 (L) 06/21/2020 0558   PROT 6.5 06/20/2020 2036   ALBUMIN 2.7 (L) 06/20/2020 2036   AST 10 (L) 06/20/2020 2036   ALT 12 06/20/2020 2036   ALKPHOS 69 06/20/2020 2036   BILITOT 2.0 (H) 06/20/2020 2036   GFRNONAA 4 (L) 06/21/2020 0558   GFRAA 47 (L) 05/27/2018 0424   Lab Results  Component Value Date   TSH 4.061 05/23/2018   Lab Results  Component Value Date   VITAMINB12 1,095 (H) 06/21/2020    No results found for: RPR  No results found for: HIV1X2  Imaging I have reviewed the images obtained: CT-scan of  the brain IMPRESSION: 1. Ill-defined low-density focus within the inferior-medial LEFT occipital lobe, of uncertain etiology or chronicity, possibly representing a site of previous ischemic or traumatic insult, but subacute ischemia not excluded. Consider brain MRI for further characterization. 2. 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 3. No intracranial hemorrhage. No mass effect or midline shift. 4. RIGHT maxillary sinus disease, incompletely imaged, of uncertain chronicity.  CT angio head or neck IMPRESSION: 1. Carotid and vertebral arteries are normal in the neck without stenosis 2. Negative for intracranial large vessel occlusion 3. Stenosis right P1 segment which may be due to patent posterior communicating artery on the right. 4. Review of CT from 1/28 and 1/29 demonstrates hypodensity in the left  occipital lobe and left thalamus suspicious for recent infarction.  MRI examination of the brain 1. Multiple scattered punctate acute to early subacute ischemic white matter infarcts involving the periventricular and deep white matter of both cerebral hemispheres, right greater than left. No associated hemorrhage or mass effect. 2. 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 ] ECHO 06/19/2020 1. Left ventricular ejection fraction, by estimation, is 40 to 45%. The  left ventricle has mildly decreased function. The left ventricle  demonstrates global hypokinesis. The left ventricular internal cavity size  was moderately dilated. There is moderate  concentric left ventricular hypertrophy. Left ventricular diastolic  parameters were normal. There is the interventricular septum is flattened  in diastole ('D' shaped left ventricle), consistent with right ventricular  volume overload.  2. Right ventricular systolic function is moderately reduced. The right  ventricular size is moderately enlarged.  3. A small pericardial effusion is present. The pericardial effusion is  posterior to the left ventricle and circumferential. There is no evidence  of cardiac tamponade.  4. The mitral valve is grossly normal. Trivial mitral valve  regurgitation.  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  6. The aortic valve is grossly normal. Aortic valve regurgitation is not  visualized.   Impression: 35 year old man with past medical history of type 1 DM, CKD 3, HFeEF, HTN admitted on 06/18/2020 for generalized weakness and found to have severely deranged renal function-acute worsening on top of CKD 3 for which he was being treated and evaluated in the hospital by medicine and nephrology. He had sudden change in his mentation with diminished mentation yesterday and then witnessed seizure activity twice since last night for which he received  Ativan. He is currently also on Keppra 500 twice daily, after a 1000 mg load.  Concern for status epilepticus as he has not returned back to his baseline.  On examination, he is extremely encephalopathic. He also had some anisocoria which may be physiologic as both pupils are reactive. He did not have any clear seizure-like activity on brief evaluation prior to intubation.  He also appears to have a question of some myoclonic/asterixis activity on passive lifting of his upper extremities versus mildly tremulous movements when he moves his arms.  Hypodensity seen on head CT correlates with the chronic stroke on MRI brain which may certainly be a seizure focus.  His current condition might be secondary to the severe metabolic derangement from the acute renal failure but the lethargic, change in mental status very suddenly as well as the leukocytosis concern for an infectious process including a CNS infection (though this also can be seen in seizure).  Given the punctate multifocal strokes on MRI, he will also need a stroke work-up.  Given multiple vascular territories involved suspect a central embolic source, likely cardioembolic given his reduced left ventricular function.  However the possibility of endocarditis should also be considered given his leukocytosis, altered mental status and initial hypothermia, now low-grade fever (37.7).  The possibility of a VZV vasculitis should also be considered.  Given worsened outcomes with some forms of bacterial meningitis when steroids are given after initiation of antibiotics, will hold on steroids for now  Recommendations:  # AMS - RPR, HIV, -Continue meningitis coverage ceftriaxone, ampicillin, acyclovir and vanc per pharmacy dosing -LTM EEG monitoring  -Attempted LP for CSF studies, failed at bedside, appreciate fluoroscopy assistance -Continue Keppra 1000 mg daily -- will need to be adjusted based on renal function/dialysis (currently appropriate for  CVVHD) -MRI brain completed as above -Appreciate nephrology consultation for renal function -Appreciate critical care medicine management of comorbidities including diabetes and ventilator  # Multifocal punctate strokes - Stroke labs TSH, ESR, RPR, HIV, HgbA1c, fasting lipid panel - MRI brain completed as above  - CTA completed as above  - Frequent neuro checks - Echocardiogram completed as above  - Hold on antiplatelet given concern for potential endocarditis  - Risk factor modification - Telemetry monitoring - Blood pressure goal   - Normotension - PT consult, OT consult, Speech consult when stabilized  - Stroke team to follow   Pt seen by NP/Neuro and later by MD. Note/plan to be edited by MD as needed.  Anibal Henderson, AGAC-NP [facility] Pager: [phone]   Attending Neurologist's note:  I personally saw this patient, gathering history, performing a full neurologic examination, reviewing relevant labs, personally reviewing relevant imaging including head CT and CTA, and formulated the assessment and plan, adding the note above for completeness and clarity to accurately reflect my thoughts.   65 minutes were spent in the critical care of this patient   [place] [phone]

## 2020-06-21 NOTE — Progress Notes (Signed)
To MRI with Josefina Do RN

## 2020-06-21 NOTE — Procedures (Signed)
Intubation Procedure Note  Adrian Evans  [id number]  1986-01-22  Date:06/21/20  Time:4:31 PM   Provider Performing:Topacio Cella    Procedure: Intubation (31500)  Indication(s) Respiratory Failure  Consent Risks of the procedure as well as the alternatives and risks of each were explained to the patient and/or caregiver.  Consent for the procedure was obtained and is signed in the bedside chart   Anesthesia Etomidate and Rocuronium   Time Out Verified patient identification, verified procedure, site/side was marked, verified correct patient position, special equipment/implants available, medications/allergies/relevant history reviewed, required imaging and test results available.   Sterile Technique Usual hand hygeine, masks, and gloves were used   Procedure Description Patient positioned in bed supine.  Sedation given as noted above.  Patient was intubated with endotracheal tube using DL.  View was Grade 3 only epiglottis .  Number of attempts was 1.  Colorimetric CO2 detector was consistent with tracheal placement.   Complications/Tolerance None; patient tolerated the procedure well. Chest X-ray is ordered to verify placement.   EBL Minimal   Specimen(s) None

## 2020-06-21 NOTE — Progress Notes (Signed)
ESAM GOECKNER  MRN: [id number]  DOB/AGE: [birth year] 35 y.o.  Primary Care Physician:Morayati, Lourdes Sledge, MD  Admit date: 06/18/2020  Chief Complaint:  Chief Complaint  Patient presents with  . Weakness    S-Pt presented on  06/18/2020 with  Chief Complaint  Patient presents with  . Weakness  .    Pt is lethargic, not offering any complaints   Meds . Chlorhexidine Gluconate Cloth  6 each Topical Daily  . insulin aspart  0-5 Units Subcutaneous QHS  . insulin aspart  0-9 Units Subcutaneous TID WC  . mouth rinse  15 mL Mouth Rinse BID  . midodrine  10 mg Oral TID WC  . vancomycin variable dose per unstable renal function (pharmacist dosing)   Does not apply See admin instructions         [id number] to get any data   Physical Exam: Vital signs in last 24 hours: Temp:  [98.1 F (36.7 C)-98.6 F (37 C)] 98.5 F (36.9 C) (01/29 1200) Pulse Rate:  [78-88] 85 (01/29 1200) Resp:  [0-21] 18 (01/29 1200) BP: (80-171)/(47-156) 93/58 (01/29 1200) SpO2:  [96 %-100 %] 100 % (01/29 1200) Weight:  [98.6 kg] 98.6 kg (01/29 0449) Weight change:     Intake/Output from previous day: 01/28 0701 - 01/29 0700 In: 1009.7 [I.V.:39.8; IV Piggyback:969.9] Out: 0  No intake/output data recorded.   Physical Exam:  General- pt is lying comfortably in bed   resp- No acute REsp distress, Rhonchi  CVS- S1S2 regular in rate and rhythm  GIT- BS+, soft, Non tender , Non distended  EXT- No LE Edema,  No Cyanosis  Access-temporary catheter in situ  Lab Results:  CBC  Recent Labs    06/19/20 0835 06/21/20 0558  WBC 6.2 29.8*  HGB 12.6* 12.8*  HCT 36.7* 35.8*  PLT 240 351    BMET  Recent Labs    06/20/20 2036 06/21/20 0558  NA 137 140  K 3.4* 4.2  CL 105 109  CO2 13* 14*  GLUCOSE 95 107*  BUN 92* 98*  CREATININE 13.83* 14.94*  CALCIUM 8.0* 7.8*    Creatinine trend 2022 16==>14 2020 2.0--2.4 2019 2.4--2.5  Most recent Creatinine trend  Lab Results   Component Value Date   CREATININE 14.94 (H) 06/21/2020   CREATININE 13.83 (H) 06/20/2020   CREATININE 18.30 (H) 06/20/2020      MICRO   Recent Results (from the past 240 hour(s))  Blood culture (routine single)     Status: None (Preliminary result)   Collection Time: 06/18/20  3:08 PM   Specimen: BLOOD  Result Value Ref Range Status   Specimen Description BLOOD BLOOD LEFT ARM  Final   Special Requests   Final    BOTTLES DRAWN AEROBIC AND ANAEROBIC Blood Culture results may not be optimal due to an inadequate volume of blood received in culture bottles   Culture   Final    NO GROWTH 3 DAYS Performed at [facility], [address] [place], [place] [address]    Report Status PENDING  Incomplete  SARS Coronavirus 2 by RT PCR (hospital order, performed in [place] hospital lab) Nasopharyngeal Nasopharyngeal Swab     Status: None   Collection Time: 06/18/20  3:08 PM   Specimen: Nasopharyngeal Swab  Result Value Ref Range Status   SARS Coronavirus 2 NEGATIVE NEGATIVE Final    Comment: (NOTE) SARS-CoV-2 target nucleic acids are NOT DETECTED.  The SARS-CoV-2 RNA is generally detectable in upper and lower  respiratory specimens during the acute phase of infection. The lowest concentration of SARS-CoV-2 viral copies this assay can detect is 250 copies / mL. A negative result does not preclude SARS-CoV-2 infection and should not be used as the sole basis for treatment or other patient management decisions.  A negative result may occur with improper specimen collection / handling, submission of specimen other than nasopharyngeal swab, presence of viral mutation(s) within the areas targeted by this assay, and inadequate number of viral copies (<250 copies / mL). A negative result must be combined with clinical observations, patient history, and epidemiological information.  Fact Sheet for Patients:   StrictlyIdeas.no  Fact Sheet for Healthcare  Providers: BankingDealers.co.za  This test is not yet approved or  cleared by the [place] FDA and has been authorized for detection and/or diagnosis of SARS-CoV-2 by FDA under an Emergency Use Authorization (EUA).  This EUA will remain in effect (meaning this test can be used) for the duration of the COVID-19 declaration under Section 564(b)(1) of the Act, 21 U.S.C. section 360bbb-3(b)(1), unless the authorization is terminated or revoked sooner.  Performed at [facility], [address] [place], [place] [address]   MRSA PCR Screening     Status: None   Collection Time: 06/20/20  1:19 PM   Specimen: Nasal Mucosa; Nasopharyngeal  Result Value Ref Range Status   MRSA by PCR NEGATIVE NEGATIVE Final    Comment:        The GeneXpert MRSA Assay (FDA approved for NASAL specimens only), is one component of a comprehensive MRSA colonization surveillance program. It is not intended to diagnose MRSA infection nor to guide or monitor treatment for MRSA infections. Performed at [facility], [address] [place], [place] [address]          Impression:   Mr. TRAVIN SUPPA is a 35 y.o. 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 most likely before 2019 Patient has CKD stage secondary to long-term history of diabetes mellitus Patient is currently on hemodialysis with a plan to transition him to peritoneal dialysis     2) hypotension Patient is currently stable     3)Anemia of chronic disease  CBC Latest Ref Rng & Units 06/21/2020 06/19/2020 06/18/2020  WBC 4.0 - 10.5 K/uL 29.8(H) 6.2 4.9   Hemoglobin 13.0 - 17.0 g/dL 12.8(L) 12.6(L) 12.1(L)  Hematocrit 39.0 - 52.0 % 35.8(L) 36.7(L) 35.5(L)  Platelets 150 - 400 K/uL 351 240 211       HGb at goal (9--11) No need for pressors  4) Secondary hyperparathyroidism -CKD Mineral-Bone Disorder    Lab Results  Component Value Date   PTH 170 (H) 06/18/2020   CALCIUM 7.8 (L) 06/21/2020   PHOS 7.3 (H) 06/21/2020    Secondary Hyperparathyroidism present  Phosphorus is not at goal. Dialysis should help  5) pericardial effusion Cardiology/primary team is following  6) Electrolytes   BMP Latest Ref Rng & Units 06/21/2020 06/20/2020 06/20/2020  Glucose 70 - 99 mg/dL 107(H) 95 80  BUN 6 - 20 mg/dL 98(H) 92(H) 125(H)  Creatinine 0.61 - 1.24 mg/dL 14.94(H) 13.83(H) 18.30(H)  Sodium 135 - 145 mmol/L 140 137 138  Potassium 3.5 - 5.1 mmol/L 4.2 3.4(L) 4.8  Chloride 98 - 111 mmol/L 109 105 112(H)  CO2 22 - 32 mmol/L 14(L) 13(L) 9(L)  Calcium 8.9 - 10.3 mg/dL 7.8(L) 8.0(L) 7.6(L)     Sodium Normonatremic   Potassium Normokalemic    7)Acid base  140-123=17  Delta AG  17-9=8  Delta Bicarb  24-14=10  Co2 is not at goal  Patient is on IV bicarb Patient bicarb is improving   8) seizures Patient is being closely followed by the neurologist His plan is to transfer the patient to [facility] for video EEG   Plan:   Will dialyze today     Mercy Leppla s Theador Hawthorne 06/21/2020, 12:24 PM

## 2020-06-21 NOTE — Progress Notes (Addendum)
Received notification from radiology for concerning findings on CT head-CT head with concern for ill defined low density focus in the inferior medial left occipital lobe of uncertain etiology or chronicity but either a site of prior ischemic or traumatic insult or subacute ischemia.  MRI brain would better characterize this.  Since he is unable to get the MRI for right now-we will get a CT angiogram head and neck to make sure we are not missing a posterior circulation occlusion. Small intensity focus of the left thalamus most suggestive of old lacunar infarct but new compared to 2019.  Other than that, plan remains as above for LP, MRI, transfer to [facility] for video EEG and management of toxic metabolic derangements  -- Amie Portland, MD Neurologist [facility] Pager: [phone]

## 2020-06-21 NOTE — H&P (Signed)
NAME:  Adrian Evans, MRN:  [id number], DOB:  [birth year], LOS: 0 ADMISSION DATE:  (Not on file), CONSULTATION DATE:  1/29 REFERRING MD:  [facility] CCM, CHIEF COMPLAINT:  AMS  Brief History:  35 yo M admitted to [facility] 1/26 w n/v malaise, weakness. Hypotensive in ED and volume resuscitated. Worse AMS without clear etiology, transferring to [facility] 1/29 for LP and cEEG  History of Present Illness:  34 yo M PMH HFrEF, CKD III, DMI, diabetic retinopathy, HTN presented to [facility] ED with weakness, malaise, n/v and inability to take POs. In ED, hypotensive felt to be hypovolemic and admitted to [facility] for volume resuscitation and further care. Nephro consulted for AKI on CKD vs progression to ESRD and cardiology consulted for HFrEF. On 1/28 evening PCCM consulted for AMs and stupor. This was worked up without acute CT H finding, but rigidity & seizure like activity was witnessed during a telemedicine neuro consult. 1/29 neurology rec transfer to [facility] for cEEG and LP. Patient started on acyclovir, ampicillin, rocephin and vanc 1/29.    Past Medical History:  CKD III DM I Diabetic retinopathy HFrEF  Significant Hospital Events:  1/26 admitted, in shock felt to be hypovolemic  1/27 cards consult, nephrology consult. Surgery and vascular consults for HD access 1/28 hypotensive T to ICU. AMS, sz during tele neuro eval. Rec HD 1/29 Keppra, meningitis coverage, [facility]  Consults:  Nephro Vascular (for permcath) GenSurg (for eval for PD) Cards Critical Care Neuro   Procedures:  1/28 CVC>  Significant Diagnostic Tests:   1/26 renal US: no hydronephrosis. 2.1 solid mass in R hepatic lobe ? hemangioma  ECHO 1/27> LVEF 40- 45%, LV hypokinesis, Dilated LV, moderate LVH, Interventricular septum is flatted in diastole/D shaped. RV reduces systolic function. Enlarged LV. Small pericardial effusion posterior to LV, circumferrential, no evidence of tamponade   1/28 CT H no acute intracranial  abnormality -- ill defined low density focus in L thalamus 1/28 CTA h/n> no LVO  Micro Data:  1/26 COVID - neg  Antimicrobials:  1/29 acyclovir> 1/29 ampicillin> 1/29 rocephin> 1/29 vanc>  Interim History / Subjective:  Arrives to neuro ICU at [facility] from [facility]  Objective   There were no vitals taken for this visit.        Intake/Output Summary (Last 24 hours) at 06/21/2020 1515 Last data filed at 06/21/2020 1419 Gross per 24 hour  Intake 1222.37 ml  Output 0 ml  Net 1222.37 ml   There were no vitals filed for this visit.  Examination:   Physical exam: General: Actually ill-appearing young African-American male, lying on the bed HEENT: [place]/AT, eyes anicteric.  ETT in place Neuro: Lethargic, follow commands intermittently, pupils 4 mm on right and 2 mm on left, reactive to light, moving all 4 extremities Chest: Coarse breath sounds, no wheezes or rhonchi Heart: Regular rate and rhythm, no murmurs or gallops Abdomen: Soft, nontender, nondistended, bowel sounds present Skin: No rash     Resolved Hospital Problem list     Assessment and plan:  Acute encephalopathy, multifactorial Could be uremic, infectious versus toxic are seizures Patient presented with altered mental status CT head showed hypodensity in left occipital lobe could be due to acute stroke versus infection His BUN and creatinine are elevated Do not have UDS to confirm if toxic substances were involved Patient will have LP done He will require MRI brain and then continuous EEG CT angiogram of head and neck was negative for LVO  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 Patient presented with altered mental status, CT head showed hypodensity in left occipital area LP is being performed Continue with acyclovir, ampicillin, ceftriaxone and vancomycin for possible meningeal encephalitis  Acute renal failure on CKD 3a High anion gap metabolic acidosis Patient is not making much urine Nephrology is  consulted Probable patient will be started on CRRT tonight Monitor urine output, serum creatinine and serum bicarbonate level   Possible status epilepticus Patient is loaded with Keppra We will start on continuous EEG   Acute hypoxic respiratory failure, inability to protect airway due to altered mental status Patient was intubated and placed on mechanical ventilation Continue lung protective ventilation VAP bundle Elevate head end of the bed  Acute on chronic systolic/diastolic congestive heart failure Patient echocardiogram is suggestive of EF 40 to 45%, dilated LV and global hypokinesis associated with diastolic dysfunction Started on aspirin Holding meds for now Patient is not volume overloaded on exam  Diabetes type 2 Continue sliding scale insulin Fingerstick goal 140-180   Best practice (evaluated daily)  Diet: NPO Pain/Anxiety/Delirium protocol (if indicated): Propofol VAP protocol (if indicated): Ordered DVT prophylaxis: SCD GI prophylaxis: Famotidine Glucose control: SSI Mobility: Bedrest Disposition: ICU  Goals of Care:  Last date of multidisciplinary goals of care discussion: Pending Family and staff present:  Summary of discussion:  Follow up goals of care discussion due:  Code Status: Full code  Labs   CBC: Recent Labs  Lab 06/18/20 1334 06/18/20 1616 06/19/20 0835 06/21/20 0558  WBC 5.3 4.9 6.2 29.8*  NEUTROABS 4.2  --   --   --   HGB 12.1* 12.1* 12.6* 12.8*  HCT 35.5* 35.5* 36.7* 35.8*  MCV 90.1 90.1 90.8 87.5  PLT 222 211 240 [id number]    Basic Metabolic Panel: Recent Labs  Lab 06/18/20 1334 06/18/20 1616 06/18/20 2041 06/19/20 0835 06/20/20 0334 06/20/20 2036 06/21/20 0558  NA 132*  --   --  134* 138 137 140  K 4.5  --   --  4.3 4.8 3.4* 4.2  CL 107  --   --  109 112* 105 109  CO2 8*  --   --  9* 9* 13* 14*  GLUCOSE 167*  --   --  112* 80 95 107*  BUN 125*  --   --  131* 125* 92* 98*  CREATININE 16.42* 15.83*  --  16.80* 18.30*  13.83* 14.94*  CALCIUM 7.7*  --   --  7.4* 7.6* 8.0* 7.8*  MG  --   --   --  2.3  --  1.8 2.4  PHOS  --   --  9.3*  --   --  6.3* 7.3*   GFR: Estimated Creatinine Clearance: 8.2 mL/min (A) (by C-G formula based on SCr of 14.94 mg/dL (H)). Recent Labs  Lab 06/18/20 1334 06/18/20 1339 06/18/20 1508 06/18/20 1616 06/19/20 0835 06/20/20 2036 06/21/20 0558  PROCALCITON  --   --   --   --   --  2.05 4.27  WBC 5.3  --   --  4.9 6.2  --  29.8*  LATICACIDVEN  --  0.9 0.6  --   --  1.1  --     Liver Function Tests: Recent Labs  Lab 06/18/20 1334 06/20/20 2036  AST 6* 10*  ALT 12 12  ALKPHOS 78 69  BILITOT 1.2 2.0*  PROT 6.2* 6.5  ALBUMIN 2.6* 2.7*   No results for input(s): LIPASE, AMYLASE in the last 168 hours.  Recent Labs  Lab 06/20/20 2036  AMMONIA 25    ABG No results found for: PHART, PCO2ART, PO2ART, HCO3, TCO2, ACIDBASEDEF, O2SAT   Coagulation Profile: Recent Labs  Lab 06/18/20 1334  INR 1.2    Cardiac Enzymes: Recent Labs  Lab 06/18/20 1508  CKTOTAL 397    HbA1C: Hgb A1c MFr Bld  Date/Time Value Ref Range Status  06/19/2020 08:35 AM 7.4 (H) 4.8 - 5.6 % Final    Comment:    (NOTE) Pre diabetes:          5.7%-6.4%  Diabetes:              >6.4%  Glycemic control for   <7.0% adults with diabetes   05/22/2018 10:58 PM 5.9 (H) 4.8 - 5.6 % Final    Comment:    (NOTE) Pre diabetes:          5.7%-6.4% Diabetes:              >6.4% Glycemic control for   <7.0% adults with diabetes     CBG: Recent Labs  Lab 06/20/20 1157 06/20/20 1252 06/20/20 1934 06/21/20 0738 06/21/20 1124  GLUCAP 91 93 85 109* 142*    Review of Systems:   Unable to obtain as patient is encephalopathic and now intubated  Past Medical History:  He,  has a past medical history of Diabetes mellitus without complication ([place]).   Surgical History:   Past Surgical History:  Procedure Laterality Date  . EYE SURGERY    . RIGHT/LEFT HEART CATH AND CORONARY ANGIOGRAPHY  N/A 05/25/2018   Procedure: RIGHT/LEFT HEART CATH AND CORONARY ANGIOGRAPHY;  Surgeon: Corey Skains, MD;  Location: [place] [facility];  Service: Cardiovascular;  Laterality: N/A;  . TONSILLECTOMY       Social History:   reports that he has never smoked. He has never used smokeless tobacco. He reports previous alcohol use. He reports previous drug use.   Family History:  His family history includes Hypertension in his mother.   Allergies No Known Allergies   Home Medications  Prior to Admission medications   Medication Sig Start Date End Date Taking? Authorizing Provider  acetaminophen (TYLENOL) 325 MG tablet Take 2 tablets (650 mg total) by mouth every 6 (six) hours as needed for mild pain (or Fever >/= 101). 06/21/20   Amie Portland, MD  bisacodyl (DULCOLAX) 5 MG EC tablet Take 1 tablet (5 mg total) by mouth daily as needed for moderate constipation. 06/21/20   Amie Portland, MD  Chlorhexidine Gluconate Cloth 2 % PADS Apply 6 each topically daily. 06/22/20   Amie Portland, MD  insulin aspart (NOVOLOG) 100 UNIT/ML injection Inject 0-9 Units into the skin 3 (three) times daily with meals. 06/21/20   Amie Portland, MD  insulin aspart (NOVOLOG) 100 UNIT/ML injection Inject 0-5 Units into the skin at bedtime. 06/21/20   Amie Portland, MD  levETIRAcetam (KEPRRA) 500 MG/100ML SOLN Inject 100 mLs (500 mg total) into the vein daily. 06/22/20   Amie Portland, MD  midodrine (PROAMATINE) 10 MG tablet Take 1 tablet (10 mg total) by mouth 3 (three) times daily with meals. 06/21/20   Amie Portland, MD  norepinephrine (LEVOPHED) 16-5 MG/250ML-% SOLN Inject 0-40 mcg/min into the vein continuous. 06/21/20   Amie Portland, MD  sodium chloride 0.9 % infusion Inject 250 mLs into the vein continuous. 06/21/20   Amie Portland, MD     Total critical care time: 50 minutes  Performed by: Jacky Kindle   Critical care  time was exclusive of separately billable procedures and treating other patients.   Critical  care was necessary to treat or prevent imminent or life-threatening deterioration.   Critical care was time spent personally by me on the following activities: development of treatment plan with patient and/or surrogate as well as nursing, discussions with consultants, evaluation of patient's response to treatment, examination of patient, obtaining history from patient or surrogate, ordering and performing treatments and interventions, ordering and review of laboratory studies, ordering and review of radiographic studies, pulse oximetry and re-evaluation of patient's condition.   Jacky Kindle MD  Pulmonary Critical Care See Amion for pager If no response to pager, please call [phone] until 7pm After 7pm, Please call E-link [phone]

## 2020-06-21 NOTE — Progress Notes (Signed)
Pt's wife updated by RN and Neurology NP.

## 2020-06-21 NOTE — Progress Notes (Signed)
Name: Adrian Evans MRN: [id number] DOB: 04-24-1986     CONSULTATION DATE: 06/18/2020  REFERRING MD :  Manuella Ghazi  CHIEF COMPLAINT:  Altered mental status   STUDIES:  Echo EF 45%, renal U/S with medical disease  HISTORY OF PRESENT ILLNESS:    35 yo AAM with DM1 HFeEF and CKDIII presented to the ED for progressive fatigue and dyspnea. He had developed vomiting and soon after arrival became hypotensive, this corrected with 2L of fluid in the ED . He was found to have a BUN 125 creatinine of 16.42.  CXR proved volume overload and stigmata of CHF. He was admitted for nephrology evaluation. Today a Rapid Response was called from the floor for hypotension and stupor. The patient was indeed both upon my arrival. He was ordered 3 amps of bicarb for tCO2 of 9, and was reversed with Narcan for some earlier pain medication. He was given 500cc 5% albumin. With these measures his blood pressure improved and his GCS improved >8. He is planned for HD today, but will need the ICU for potential CRRT or pressor support of HD   PAST MEDICAL HISTORY :   has a past medical history of Diabetes mellitus without complication ([place]).  has a past surgical history that includes Tonsillectomy; Eye surgery; and RIGHT/LEFT HEART CATH AND CORONARY ANGIOGRAPHY (N/A, 05/25/2018). Prior to Admission medications   Medication Sig Start Date End Date Taking? Authorizing Provider  carvedilol (COREG) 12.5 MG tablet Take 12.5 mg by mouth 2 (two) times daily. 01/21/20  Yes [provider]  furosemide (LASIX) 40 MG tablet Take 40 mg by mouth 2 (two) times daily. 02/26/20 02/25/21 Yes [provider]  hydrALAZINE (APRESOLINE) 25 MG tablet Take 25 mg by mouth 2 (two) times daily. 10/02/19 10/01/20 Yes [provider]  insulin degludec (TRESIBA) 100 UNIT/ML FlexTouch Pen Inject 10 Units into the skin at bedtime.   Yes [provider]  isosorbide mononitrate (IMDUR) 30 MG 24 hr tablet Take 30 mg by mouth  daily. 01/21/20  Yes [provider]  losartan (COZAAR) 100 MG tablet Take 100 mg by mouth daily. 02/26/20 02/25/21 Yes [provider]   No Known Allergies  FAMILY HISTORY:  family history includes Hypertension in his mother. SOCIAL HISTORY:  reports that he has never smoked. He has never used smokeless tobacco. He reports previous alcohol use. He reports previous drug use.  REVIEW OF SYSTEMS:   Unable to obtain due to critical illness      Estimated body mass index is 29.48 kg/m as calculated from the following:   Height as of this encounter: 6' (1.829 m).   Weight as of this encounter: 98.6 kg.    VITAL SIGNS: Temp:  [98.6 F (37 C)] 98.6 F (37 C) (01/28 2000) Pulse Rate:  [74-88] 87 (01/29 0700) Resp:  [11-21] 17 (01/29 0700) BP: (71-171)/(46-156) 113/66 (01/29 0700) SpO2:  [96 %-100 %] 100 % (01/29 0700) Weight:  [98.6 kg] 98.6 kg (01/29 0449)   I/O last 3 completed shifts: In: 1009.7 [I.V.:39.8; IV Piggyback:969.9] Out: 0  No intake/output data recorded.   SpO2: 100 %   Physical Examination:  GENERAL:critically ill appearing, +resp distress HEAD: Normocephalic, atraumatic.  EYES: Pupils equal, round, reactive to light.  No scleral icterus.  MOUTH: Moist mucosal membrane. NECK: Supple. No JVD.  PULMONARY: +rhonchi, +wheezing CARDIOVASCULAR: S1 and S2. Regular rate and rhythm. No murmurs, rubs, or gallops.  GASTROINTESTINAL: Soft, nontender, -distended.  Positive bowel sounds.  MUSCULOSKELETAL: No swelling,  clubbing, or edema.  NEUROLOGIC: arousable but then stuporous, protecting airway, confused verbal responses to direct stim  SKIN:intact,warm,dry  I personally reviewed lab work that was obtained in last 24 hrs. CXR Independently reviewed-pulmonary edema   MEDICATIONS: I have reviewed all medications and confirmed regimen as documented   CULTURE RESULTS   Recent Results (from the past 240 hour(s))  Blood culture (routine single)      Status: None (Preliminary result)   Collection Time: 06/18/20  3:08 PM   Specimen: BLOOD  Result Value Ref Range Status   Specimen Description BLOOD BLOOD LEFT ARM  Final   Special Requests   Final    BOTTLES DRAWN AEROBIC AND ANAEROBIC Blood Culture results may not be optimal due to an inadequate volume of blood received in culture bottles   Culture   Final    NO GROWTH 3 DAYS Performed at [facility], [address], [place], [place] [address]    Report Status PENDING  Incomplete  SARS Coronavirus 2 by RT PCR (hospital order, performed in [place] hospital lab) Nasopharyngeal Nasopharyngeal Swab     Status: None   Collection Time: 06/18/20  3:08 PM   Specimen: Nasopharyngeal Swab  Result Value Ref Range Status   SARS Coronavirus 2 NEGATIVE NEGATIVE Final    Comment: (NOTE) SARS-CoV-2 target nucleic acids are NOT DETECTED.  The SARS-CoV-2 RNA is generally detectable in upper and lower respiratory specimens during the acute phase of infection. The lowest concentration of SARS-CoV-2 viral copies this assay can detect is 250 copies / mL. A negative result does not preclude SARS-CoV-2 infection and should not be used as the sole basis for treatment or other patient management decisions.  A negative result may occur with improper specimen collection / handling, submission of specimen other than nasopharyngeal swab, presence of viral mutation(s) within the areas targeted by this assay, and inadequate number of viral copies (<250 copies / mL). A negative result must be combined with clinical observations, patient history, and epidemiological information.  Fact Sheet for Patients:   StrictlyIdeas.no  Fact Sheet for Healthcare Providers: BankingDealers.co.za  This test is not yet approved or  cleared by the [place] FDA and has been authorized for detection and/or diagnosis of SARS-CoV-2 by FDA under an Emergency Use  Authorization (EUA).  This EUA will remain in effect (meaning this test can be used) for the duration of the COVID-19 declaration under Section 564(b)(1) of the Act, 21 U.S.C. section 360bbb-3(b)(1), unless the authorization is terminated or revoked sooner.  Performed at [facility], [address] [place], [place] [address]   MRSA PCR Screening     Status: None   Collection Time: 06/20/20  1:19 PM   Specimen: Nasal Mucosa; Nasopharyngeal  Result Value Ref Range Status   MRSA by PCR NEGATIVE NEGATIVE Final    Comment:        The GeneXpert MRSA Assay (FDA approved for NASAL specimens only), is one component of a comprehensive MRSA colonization surveillance program. It is not intended to diagnose MRSA infection nor to guide or monitor treatment for MRSA infections. Performed at [facility], [address] [place], [place] [address]           IMAGING    CT HEAD WO CONTRAST  Result Date: 06/20/2020 CLINICAL DATA:  Mental status change EXAM: CT HEAD WITHOUT CONTRAST TECHNIQUE: Contiguous axial images were obtained from the base of the skull through the vertex without intravenous contrast. COMPARISON:  CT head  05/10/2018 FINDINGS: Brain: No evidence of acute infarction, hemorrhage, hydrocephalus, extra-axial collection or mass lesion/mass effect. Vascular: Negative for hyperdense vessel Skull: Negative Sinuses/Orbits: Mucosal edema right maxillary sinus. Remaining paranasal sinuses clear. Negative orbit Other: None IMPRESSION: Normal CT head Mucosal edema right maxillary sinus. Electronically Signed   By: Franchot Gallo M.D.   On: 06/20/2020 21:10   DG Chest Port 1 View  Result Date: 06/20/2020 CLINICAL DATA:  Central line placement. EXAM: PORTABLE CHEST 1 VIEW COMPARISON:  06/18/2020 FINDINGS: New right internal jugular central venous line has its tip in the lower superior vena cava. No pneumothorax. Cardiac silhouette mildly enlarged.  No mediastinal or  hilar masses. Pulmonary vascular prominence is similar to the prior study. No pulmonary edema. Lungs otherwise clear. IMPRESSION: 1. Right internal jugular central venous catheter has its tip in the lower superior vena cava. No pneumothorax. 2. No acute cardiopulmonary disease. Electronically Signed   By: Lajean Manes M.D.   On: 06/20/2020 16:10     Nutrition Status: NPO        ASSESSMENT AND PLAN  ACUTE KIDNEY INJURY/Renal Failure -per nephrology -vas cath -CRRT vs IHD with pressor support as required -tolerate IHD yesterday afternoon with minimal levophed -replete bicarb   NEUROLOGY - encephalopathic from uremia - avoidance any sedation or analgesia -overnight had focal seizure witnessed by tele-neuro, placed on Keppra -strange facies/inadvertant smiling occurring perhaps non-convulsant events as well?? -continue with Keppra -leukocytosis now, nearly 5 fold increase in WBC to close to 30 last night  -will cover at meningitic doses of Vanc, Rocephin, Ampicillin to avoid delay -LP via IR (body habitus) -MRI ordered to follow-up on abnormal CT (remote/chronic stroke?) -spot EEG ordered, pending study completion  D/W Dr. Erin Fulling at [facility]. I fear the patient's encephalopathy may not be completely accounted for uremia. The stout leukocytosis is worrisome. I feel he would be better served with a 24h video EEG given the odd facial movements and the partial seizure witnessed last night. Dr. Erin Fulling graciously accepted Mr. Nolton to the ICU at [facility]. Will keep close eye on airway protection, trying to avoid intubation and clouding the neuro exam for consultant at [facility]. If there is any further alteration I will intubate him prior to moving.   CARDIAC HFrEFACUTE SYSTOLIC CARDIAC FAILURE- EF  -oxygen as needed -volume reduction per nephrology and HD -follow up cardiac enzymes as indicated Maintain rate control and afterload reduction (neither an issue presently) Volume reduction  via mechanical means Presently on levophed at 83mg since HD, however MAP this AM well over goal  Titrate pressor to off as able Trend LVF and effusion with limited echo  GI GI PROPHYLAXIS as indicated  NUTRITIONAL STATUS NPO for now   ENDO - will use ICU hypoglycemic\Hyperglycemia protocol if needed    ELECTROLYTES -follow labs as needed -replace as needed -pharmacy consultation and following    DVT/GI PRX ordered and assessed  Critical Care Time devoted to patient care services described in this note is 45 minutes.   Overall, patient is critically ill, prognosis is guarded.  Patient with Multiorgan failure and at high risk for cardiac arrest and death.     Critical Care Time devoted to patient care services described in this note is 45 minutes.  Overall, patient is critically ill, prognosis is guarded.   Patient with Multiorgan failure and at high risk for cardiac arrest and death.  Images reviewed directly and interpretation in A&P is my own unless noted Labs reviewed and evaluated  as noted in A/P

## 2020-06-21 NOTE — Consult Note (Signed)
Pharmacy Antibiotic Note  Adrian Evans is a 35 y.o. male admitted on 06/18/2020 with possible meningitis.    Patient with acute renal failure with a current Scr of 14.94 after emergent HD following tunneled catheter placement 1/28.    Pharmacy has been consulted for Vancomycin dosing.  Plan: Will give Vancomycin IV '2000mg'$  loading dose, and then dose per level for  Vancomycin Goal trough 15-20 mcg/mL.  Will follow HD plan closely for timing of levels and subsequent dosing   Height: 6' (182.9 cm) Weight: 98.6 kg (217 lb 6 oz) IBW/kg (Calculated) : 77.6  Temp (24hrs), Avg:98.6 F (37 C), Min:98.6 F (37 C), Max:98.6 F (37 C)  Recent Labs  Lab 06/18/20 1334 06/18/20 1339 06/18/20 1508 06/18/20 1616 06/19/20 0835 06/20/20 0334 06/20/20 2036 06/21/20 0558  WBC 5.3  --   --  4.9 6.2  --   --  29.8*  CREATININE 16.42*  --   --  15.83* 16.80* 18.30* 13.83* 14.94*  LATICACIDVEN  --  0.9 0.6  --   --   --  1.1  --     Estimated Creatinine Clearance: 8.2 mL/min (A) (by C-G formula based on SCr of 14.94 mg/dL (H)).    No Known Allergies  Antimicrobials this admission: Ampicillin 1/29 >> Vancomycin 1/29 >> Ceftriaxone 1/29 >>  Dose adjustments this admission: N/A  Microbiology results: 1/26 BCx: NG3d  1/28 MRSA PCR: NEG COVID NEG  Thank you for allowing pharmacy to be a part of this patient's care.  Lu Duffel, PharmD, BCPS Clinical Pharmacist 06/21/2020 8:13 AM

## 2020-06-21 NOTE — Progress Notes (Signed)
vLTM EEG started. Tested event button. Notified neuro

## 2020-06-21 NOTE — Consult Note (Addendum)
Renal Service Consult Note [facility]  Adrian Evans 06/21/2020 Adrian Blazing, MD Requesting Physician: Dr. Tacy Evans  Reason for Consult: Renal failure HPI: The patient is a 35 y.o. year-old w/ hx of DM type 1, HFrEF w/ Ef 20-25%, CKD 3b f/b nephrology, and HTN presented to ED 1/26 w/ several days of gen weakness, malaise, myalgias, and nausea/ vomiting, some cough. Covid-19 was negative. Creat was 16.  ECHO showed effusion w/o tamponade. Pt had acute HD 2 hrs at OSH yesterday, then had witnessed seizure and was transferred to [facility] for neurology support.  On arrival pt was intubated for airway control due to AMS.  We are asked to see for CRRT.    Pt not able to provide hx.   Per H&P from 1/26 at [place] pt's BP in ed was 67/50 w/ normal RR and HR. CXR showed severe CM, poss vasc congestion and mild bilat IS prominence. Pt was admitted, given 2 L IVF's in ED approx and kept on IVF's. AKI was felt to be due to low EF/ vol depletion/ ARB/ BP lowering meds / diuretics. All BP meds and diuretics were held on admssion.   Not sure if he had AKI on CKD 3b vs progressions to stage V CKD/ ESRD.  Pt is f/b CCKA w/ last creat 3.2 in Oct 2021.  Has CKD felt to be due to DM type 1, +hx of proteinuria.   Per chart the last creat in Jan 2020 was 2.06.   Per Care Everywhere creat was          In Feb 2020 = 2.43, eGFR 38          In Oct 2021 = 3.22, eGFR 27    Past Medical History  Past Medical History:  Diagnosis Date  . Diabetes mellitus without complication [facility])    Past Surgical History  Past Surgical History:  Procedure Laterality Date  . EYE SURGERY    . RIGHT/LEFT HEART CATH AND CORONARY ANGIOGRAPHY N/A 05/25/2018   Procedure: RIGHT/LEFT HEART CATH AND CORONARY ANGIOGRAPHY;  Surgeon: Corey Skains, MD;  Location: [place] [facility];  Service: Cardiovascular;  Laterality: N/A;  . TONSILLECTOMY     Family History  Family History  Problem Relation Age of Onset  . Hypertension  Mother    Social History  reports that he has never smoked. He has never used smokeless tobacco. He reports previous alcohol use. He reports previous drug use. Allergies No Known Allergies Home medications Prior to Admission medications   Medication Sig Start Date End Date Taking? Authorizing Provider  acetaminophen (TYLENOL) 325 MG tablet Take 2 tablets (650 mg total) by mouth every 6 (six) hours as needed for mild pain (or Fever >/= 101). 06/21/20   Amie Portland, MD  bisacodyl (DULCOLAX) 5 MG EC tablet Take 1 tablet (5 mg total) by mouth daily as needed for moderate constipation. 06/21/20   Amie Portland, MD  Chlorhexidine Gluconate Cloth 2 % PADS Apply 6 each topically daily. 06/22/20   Amie Portland, MD  insulin aspart (NOVOLOG) 100 UNIT/ML injection Inject 0-9 Units into the skin 3 (three) times daily with meals. 06/21/20   Amie Portland, MD  insulin aspart (NOVOLOG) 100 UNIT/ML injection Inject 0-5 Units into the skin at bedtime. 06/21/20   Amie Portland, MD  levETIRAcetam (KEPRRA) 500 MG/100ML SOLN Inject 100 mLs (500 mg total) into the vein daily. 06/22/20   Amie Portland, MD  midodrine (PROAMATINE) 10 MG tablet Take 1 tablet (10  mg total) by mouth 3 (three) times daily with meals. 06/21/20   Amie Portland, MD  norepinephrine (LEVOPHED) 16-5 MG/250ML-% SOLN Inject 0-40 mcg/min into the vein continuous. 06/21/20   Amie Portland, MD  sodium chloride 0.9 % infusion Inject 250 mLs into the vein continuous. 06/21/20   Amie Portland, MD     Vitals:   06/21/20 1537 06/21/20 1625  BP: 103/77   Pulse: 82 87  Resp: (!) 22 16  Temp: 99.8 F (37.7 C)   TempSrc: Axillary   SpO2: 100% 100%  Weight: 94.5 kg    Exam Gen pt on vent, sedated, not responding; per staff was responsive prior to intubation but very groggy No rash, cyanosis or gangrene Sclera anicteric, throat w/ ETT  No jvd or bruits Chest clear bilat to bases, no rales or wheezing RRR no MRG, tachy Abd soft ntnd no mass or ascites  +bs GU normal male MS no joint effusions or deformity Ext trace LE edema, no wounds or ulcers Neuro is sedated on the vent     Meds from OSH:  - insulin aspart tid ac  - keppra 500 qd  - levophed gtt/ midodrine 10 tid  Per Care Everywhere creat was          In Feb 2020 = 2.43, eGFR 38          In Oct 2021 = 3.22, eGFR 27     Date   Creat  eGFR   Jan 2020  2.5 >> 2.0  36- 49 stage IIIb   Feb 2020  2.43  38   Oct 2021  3.22  27 stage IV   Jun 18, 2020  16.4     Jan 29  14.8  Stage V      UA pend    UNa, UCr pend     CXR 1/29 - IMPRESSION: Low lung volumes with patchy opacities, potentially atelectasis or alternatively developing infection.   Renal US 1/26 - IMPRESSION: Echogenic kidneys bilaterally consistent with medical renal disease. 12.7/ 13.3 cm length. No hydronephrosis.    Na 140  K 4.2  CO2 14  BUN 98  Cr 14.9  Ca 7.8  Alb 2.7  LFT"s ok  Tbili 2     WBC 29k  Hb 12  Hb A1C  7.4    Assessment/ Plan: 1. AKI on CKD 3b - vs progression to CKD V. Would favor the former. B/l creat 3.2 in Oct 2021, eGFR 27 ml/min.  Underlying CKD f/b CCKA, felt to be due to T1DM mostly.  Presented to ED w/ BP's in 60's, pt on diuretcs/ BP lowering meds including ARB at home and w/ hx of N/V and poor intake. Suspect vol depletion/ ARB/ hypotension/ hypoperfusion main cause of AKI.  Renal US shows no evidence of obstruction. CXR shows some patchy opacities, no edema. UA is pending. Pt has hx proteinuria and neg w/u in 2019 per neph notes. Pt had 1st HD yest at [facility], will be starting on CRRT tonight here using temp cath R neck. Still may be dry on exam, will start IVF's at 75 cc/hr. Place foley is not already done. Hoping for renal recovery. Will follow.  2. Hypotension - possibly cardiac or hypovolemic shock, improving now 3. VDRF - sedated and intubated due to AMS 4. Seizures - likely uremia +/- other causes.  Seen by neurology.  5. DMT1 - longstanding hx 6. HFrEF - EV 20- 25%      Rob  Doctor, hospital  MD 06/21/2020, 4:36 PM  Recent Labs  Lab 06/19/20 0835 06/21/20 0558  WBC 6.2 29.8*  HGB 12.6* 12.8*   Recent Labs  Lab 06/20/20 2036 06/21/20 0558  K 3.4* 4.2  BUN 92* 98*  CREATININE 13.83* 14.94*  CALCIUM 8.0* 7.8*  PHOS 6.3* 7.3*

## 2020-06-21 NOTE — Progress Notes (Signed)
PHARMACY NOTE:  ANTIMICROBIAL RENAL DOSAGE ADJUSTMENT  Current antimicrobial regimen includes a mismatch between antimicrobial dosage and estimated renal function.  As per policy approved by the Pharmacy & Therapeutics and Medical Executive Committees, the antimicrobial dosage will be adjusted accordingly.  Current antimicrobial dosage:  Ampicillin 0.'5mg'$  Q4hr  Indication: meningitis  Renal Function:   Estimated Creatinine Clearance: 8.1 mL/min (A) (by C-G formula based on SCr of 14.94 mg/dL (H)). '[x]'$      On intermittent HD per nephro '[]'$      On CRRT    Antimicrobial dosage has been changed to:  Ampicillin 2g Q12hr    Thank you for allowing pharmacy to be a part of this patient's care.   Benetta Spar, PharmD, BCPS, [facility] Clinical Pharmacist  Please check AMION for all [address] phone numbers After 10:00 PM, call [place] [phone]'

## 2020-06-21 NOTE — Progress Notes (Signed)
[facility] Cardiology    SUBJECTIVE: Patient lethargic currently not responsive but recently got narcotics resting comfortably wife is in the room no evidence of infection no fever denied chest pain or shortness of breath earlier   Vitals:   06/21/20 0900 06/21/20 1000 06/21/20 1100 06/21/20 1200  BP: 118/62 (!) 108/57 108/66 (!) 93/58  Pulse: 85 83 83 85  Resp: '17 19 20 18  '$ Temp:    98.5 F (36.9 C)  TempSrc:    Axillary  SpO2: 100% 100% 100% 100%  Weight:      Height:         Intake/Output Summary (Last 24 hours) at 06/21/2020 1227 Last data filed at 06/21/2020 0600 Gross per 24 hour  Intake 286.39 ml  Output 0 ml  Net 286.39 ml      PHYSICAL EXAM  General: Well developed, well nourished, in no acute distress HEENT:  Normocephalic and atramatic Neck:  No JVD.  Lungs: Clear bilaterally to auscultation and percussion. Heart: HRRR . Normal S1 and S2 without gallops or murmurs.  Abdomen: Bowel sounds are positive, abdomen soft and non-tender  Msk:  Back normal, normal gait. Normal strength and tone for age. Extremities: No clubbing, cyanosis or edema.   Neuro: Lethargic sleeping barely arousable probably secondary to medication and encephalopathy Psych:  Good affect, responds appropriately   LABS: Basic Metabolic Panel: Recent Labs    06/20/20 2036 06/21/20 0558  NA 137 140  K 3.4* 4.2  CL 105 109  CO2 13* 14*  GLUCOSE 95 107*  BUN 92* 98*  CREATININE 13.83* 14.94*  CALCIUM 8.0* 7.8*  MG 1.8 2.4  PHOS 6.3* 7.3*   Liver Function Tests: Recent Labs    06/18/20 1334 06/20/20 2036  AST 6* 10*  ALT 12 12  ALKPHOS 78 69  BILITOT 1.2 2.0*  PROT 6.2* 6.5  ALBUMIN 2.6* 2.7*   No results for input(s): LIPASE, AMYLASE in the last 72 hours. CBC: Recent Labs    06/18/20 1334 06/18/20 1616 06/19/20 0835 06/21/20 0558  WBC 5.3   < > 6.2 29.8*  NEUTROABS 4.2  --   --   --   HGB 12.1*   < > 12.6* 12.8*  HCT 35.5*   < > 36.7* 35.8*  MCV 90.1   < > 90.8 87.5   PLT 222   < > 240 351   < > = values in this interval not displayed.   Cardiac Enzymes: Recent Labs    06/18/20 1508  CKTOTAL 397   BNP: Invalid input(s): POCBNP D-Dimer: No results for input(s): DDIMER in the last 72 hours. Hemoglobin A1C: Recent Labs    06/19/20 0835  HGBA1C 7.4*   Fasting Lipid Panel: No results for input(s): CHOL, HDL, LDLCALC, TRIG, CHOLHDL, LDLDIRECT in the last 72 hours. Thyroid Function Tests: No results for input(s): TSH, T4TOTAL, T3FREE, THYROIDAB in the last 72 hours.  Invalid input(s): FREET3 Anemia Panel: Recent Labs    06/18/20 2041 06/21/20 0747  VITAMINB12 1,471*  --   FOLATE 18.8 18.6  FERRITIN 796*  --   TIBC 178*  --   IRON 130  --     CT HEAD WO CONTRAST  Result Date: 06/21/2020 CLINICAL DATA:  Delirium. EXAM: CT HEAD WITHOUT CONTRAST TECHNIQUE: Contiguous axial images were obtained from the base of the skull through the vertex without intravenous contrast. COMPARISON:  Head CTs dated 06/20/2020 in 05/10/2018 FINDINGS: Brain: Ventricles are normal in size and configuration. 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 Additional ill-defined low-density focus within the inferior-medial LEFT occipital lobe, of uncertain etiology or chronicity, possibly representing a site of previous ischemic or traumatic insult, subacute ischemia not excluded. No mass, hemorrhage, edema or other evidence of acute parenchymal abnormality. No extra-axial hemorrhage. Vascular: No hyperdense vessel or unexpected calcification. Skull: Normal. Negative for fracture or focal lesion. Sinuses/Orbits: Fluid/mucosal thickening within the RIGHT maxillary sinus, incompletely imaged, of uncertain chronicity. Other: None. IMPRESSION: 1. Ill-defined low-density focus within the inferior-medial LEFT occipital lobe, of uncertain etiology or chronicity, possibly representing a  site of previous ischemic or traumatic insult, but subacute ischemia not excluded. Consider brain MRI for further characterization. 2. 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 3. No intracranial hemorrhage.  No mass effect or midline shift. 4. RIGHT maxillary sinus disease, incompletely imaged, of uncertain chronicity. These results will be called to the ordering clinician or representative by the Radiologist Assistant, and communication documented in the PACS or [employer]. Electronically Signed   By: Franki Cabot M.D.   On: 06/21/2020 11:29   CT HEAD WO CONTRAST  Result Date: 06/20/2020 CLINICAL DATA:  Mental status change EXAM: CT HEAD WITHOUT CONTRAST TECHNIQUE: Contiguous axial images were obtained from the base of the skull through the vertex without intravenous contrast. COMPARISON:  CT head 05/10/2018 FINDINGS: Brain: No evidence of acute infarction, hemorrhage, hydrocephalus, extra-axial collection or mass lesion/mass effect. Vascular: Negative for hyperdense vessel Skull: Negative Sinuses/Orbits: Mucosal edema right maxillary sinus. Remaining paranasal sinuses clear. Negative orbit Other: None IMPRESSION: Normal CT head Mucosal edema right maxillary sinus. Electronically Signed   By: Franchot Gallo M.D.   On: 06/20/2020 21:10   DG Chest Port 1 View  Result Date: 06/20/2020 CLINICAL DATA:  Central line placement. EXAM: PORTABLE CHEST 1 VIEW COMPARISON:  06/18/2020 FINDINGS: New right internal jugular central venous line has its tip in the lower superior vena cava. No pneumothorax. Cardiac silhouette mildly enlarged.  No mediastinal or hilar masses. Pulmonary vascular prominence is similar to the prior study. No pulmonary edema. Lungs otherwise clear. IMPRESSION: 1. Right internal jugular central venous catheter has its tip in the lower superior vena cava. No pneumothorax. 2. No acute  cardiopulmonary disease. Electronically Signed   By: Lajean Manes M.D.   On: 06/20/2020 16:10     Echo globally depressed left ventricular function at around 40 to 45% with LVH small pericardial effusion  TELEMETRY: Normal sinus rhythm rate of 80 nonspecific ST-T wave changes  ASSESSMENT AND PLAN:  Active Problems:   Acute renal failure (ARF) (HCC) Hypotension Metabolic encephalopathy Uremia Diabetes Cardiomyopathy on echo mild Elevated white count  Plan Agree with current therapy mild hydration Continue pressors to help treat hypotension Recommend dialysis therapy acutely Agreed with echocardiogram for assessment of left ventricular function and wall motion Rule out infection or signs of sepsis Continue diabetes management and control Avoid benzos or narcotics because of his reduced mental status   Dwayne D Callwood, MD` 06/21/2020 12:27 PM

## 2020-06-21 NOTE — Discharge Summary (Addendum)
Physician Discharge Summary  Patient ID: Adrian Evans MRN: [id number] DOB/AGE: Sep 18, 1985 35 y.o.  Admit date: 06/18/2020 Discharge date: 06/21/2020  Admission Diagnoses:  Discharge Diagnoses:  Active Problems:   Acute renal failure (ARF) (HCC)   Discharged Condition: stable  HPI:  "Chief Complaint: Generalized weakness  HPI: Adrian Evans is a 35 y.o. male with medical history significant of type 1 diabetes, HFrEF with EF 20 to 25% on echo in 2019, CKD stage IIIb followed by nephrology, hypertension who presented to the ED from home with several days of generalized weakness and malaise.  He also reports diffuse myalgias, nausea vomiting with inability to keep down oral intake, nasal congestion with sneezing and some cough.  He has been compliant with home medications including his diuretic and blood pressure medications.  Typically has lower extremity edema which he currently does not.  Denies fevers chills, dysuria, chest pain, shortness of breath.  Unclear if any sick contact but patient is a Education officer, museum at high school.  ED Course: Hypotensive with BP 67/50, afebrile, normal heart and respiratory rates.  Labs notable for mild stable anemia, acute renal failure (creatinine 16.42), secondary anion gap metabolic acidosis, mild hyponatremia, BUN 125, BNP 980, troponin 27, normal lactic acid.  Chest x-ray showed severe cardiomegaly, pulmonary venous congestion, mild bilateral interstitial prominence (mild CHF versus pneumonitis cannot be excluded).  Covid test is pending.  ED physician spoke with nephrology who recommended renal ultrasound, echo and agreed with IV hydration.  Patient admitted to hospitalist service for further evaluation management with nephrology following."  06/18/20 Adrian Evans Course:   06/20/20  35 yo AAM with DM1 HFeEF and CKDIII presented to the ED for progressive fatigue and dyspnea. He had developed vomiting and soon after arrival became  hypotensive, this corrected with 2L of fluid in the ED . He was found to have a BUN 125 creatinine of 16.42.  CXR proved volume overload and stigmata of CHF. He was admitted for nephrology evaluation. Today a Rapid Response was called from the floor for hypotension and stupor. The patient was indeed both upon my arrival. He was ordered 3 amps of bicarb for tCO2 of 9, and was reversed with Narcan for some earlier pain medication. He was given 500cc 5% albumin. With these measures his blood pressure improved and his GCS improved >8. He is planned for HD today, but will need the ICU for potential CRRT or pressor support of HD  Patient received HD with minimal levophed support  Had alteration in mental status, tele-neuro witnessed seizure, keppra started CT head without acute finding on read, neuro worried about posterior infarct (chronic)  06/21/20 EEG and MRI ordered LP ordered Services not available  D/W Neuro, best care plan for 24h EEG at [place] with more intensive neuro support  Consults: nephrology and neurology  Significant Diagnostic Studies: CT head non-acute  Treatments:HD  Discharge Exam: Blood pressure 92/61, pulse 76, temperature 98.5 F (36.9 C), temperature source Axillary, resp. rate 14, height 6' (1.829 m), weight 98.6 kg, SpO2 100 %.   Physical Examination:  GENERAL:critically ill appearing, +resp distress HEAD: Normocephalic, atraumatic.  EYES: Pupils equal, round, reactive to light.  No scleral icterus.  MOUTH: Moist mucosal membrane. NECK: Supple. No JVD.  PULMONARY: +rhonchi, +wheezing CARDIOVASCULAR: S1 and S2. Regular rate and rhythm. No murmurs, rubs, or gallops.  GASTROINTESTINAL: Soft, nontender, -distended.  Positive bowel sounds.  MUSCULOSKELETAL: No swelling, clubbing, or edema.  NEUROLOGIC: arousable but then stuporous, protecting  airway, confused verbal responses to direct stim  SKIN:intact,warm,dry Disposition: Discharge disposition:  51-Hospice/Medical Facility        MEDICATION TRANSFER INTACT RECONCILE PENDING ADMITTANCE (PLEASE SEE HELD)   Follow-up Information    [place] Follow up on 07/04/2020.   Specialty: Cardiology Why: at 8:30am. Enter through the [place] entrance Contact information: [place] [phone]              Signed: Nelle Don 06/21/2020, 2:09 PM

## 2020-06-21 NOTE — Progress Notes (Signed)
CTA head and neck with no LVO. Basilar patent.  Ready for transfer to 4N Neuro ICU at [facility]. Plan d/w Dr Curly [place] at [facility] and patient's wife at bedside She is aware that a LP will be needed.   -- Amie Portland, MD Neurologist [facility] Pager: [phone]

## 2020-06-21 NOTE — Progress Notes (Addendum)
PCCM  Discussed with Dr. Erin Fulling at [facility]. He agrees transfer warranted for continuous EEG and closer neurologic monitoring. Will make arrangements. If the patient seems to have any further depression in mental status I will intubate prior to transfer. Hopefully, this will not be required so as not to cloud initial neuro assessment upon arrival to [facility].  //Kiernan Atkerson

## 2020-06-21 NOTE — Progress Notes (Signed)
[facility] Cardiology    SUBJECTIVE: Patient still lethargic but arousable resting comfortably in bed wife at the bedside denies any pain denies any shortness of breath   Vitals:   06/21/20 0900 06/21/20 1000 06/21/20 1100 06/21/20 1200  BP: 118/62 (!) 108/57 108/66 (!) 93/58  Pulse: 85 83 83 85  Resp: '17 19 20 18  '$ Temp:    98.5 F (36.9 C)  TempSrc:    Axillary  SpO2: 100% 100% 100% 100%  Weight:      Height:         Intake/Output Summary (Last 24 hours) at 06/21/2020 1220 Last data filed at 06/21/2020 0600 Gross per 24 hour  Intake 413.43 ml  Output 0 ml  Net 413.43 ml      PHYSICAL EXAM  General: Well developed, well nourished, in no acute distress HEENT:  Normocephalic and atramatic Neck:  No JVD.  Lungs: Clear bilaterally to auscultation and percussion. Heart: HRRR . Normal S1 and S2 without gallops or murmurs.  Abdomen: Bowel sounds are positive, abdomen soft and non-tender  Msk:  Back normal, normal gait. Normal strength and tone for age. Extremities: No clubbing, cyanosis or edema.   Neuro: Alert and oriented X 3. Psych:  Good affect, responds appropriately   LABS: Basic Metabolic Panel: Recent Labs    06/20/20 2036 06/21/20 0558  NA 137 140  K 3.4* 4.2  CL 105 109  CO2 13* 14*  GLUCOSE 95 107*  BUN 92* 98*  CREATININE 13.83* 14.94*  CALCIUM 8.0* 7.8*  MG 1.8 2.4  PHOS 6.3* 7.3*   Liver Function Tests: Recent Labs    06/18/20 1334 06/20/20 2036  AST 6* 10*  ALT 12 12  ALKPHOS 78 69  BILITOT 1.2 2.0*  PROT 6.2* 6.5  ALBUMIN 2.6* 2.7*   No results for input(s): LIPASE, AMYLASE in the last 72 hours. CBC: Recent Labs    06/18/20 1334 06/18/20 1616 06/19/20 0835 06/21/20 0558  WBC 5.3   < > 6.2 29.8*  NEUTROABS 4.2  --   --   --   HGB 12.1*   < > 12.6* 12.8*  HCT 35.5*   < > 36.7* 35.8*  MCV 90.1   < > 90.8 87.5  PLT 222   < > 240 351   < > = values in this interval not displayed.   Cardiac Enzymes: Recent Labs    06/18/20 1508   CKTOTAL 397   BNP: Invalid input(s): POCBNP D-Dimer: No results for input(s): DDIMER in the last 72 hours. Hemoglobin A1C: Recent Labs    06/19/20 0835  HGBA1C 7.4*   Fasting Lipid Panel: No results for input(s): CHOL, HDL, LDLCALC, TRIG, CHOLHDL, LDLDIRECT in the last 72 hours. Thyroid Function Tests: No results for input(s): TSH, T4TOTAL, T3FREE, THYROIDAB in the last 72 hours.  Invalid input(s): FREET3 Anemia Panel: Recent Labs    06/18/20 2041 06/21/20 0747  VITAMINB12 1,471*  --   FOLATE 18.8 18.6  FERRITIN 796*  --   TIBC 178*  --   IRON 130  --     CT HEAD WO CONTRAST  Result Date: 06/21/2020 CLINICAL DATA:  Delirium. EXAM: CT HEAD WITHOUT CONTRAST TECHNIQUE: Contiguous axial images were obtained from the base of the skull through the vertex without intravenous contrast. COMPARISON:  Head CTs dated 06/20/2020 in 05/10/2018 FINDINGS: Brain: Ventricles are normal in size and configuration. 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 Additional ill-defined low-density focus within the inferior-medial LEFT occipital lobe, of uncertain etiology or chronicity, possibly representing a site of previous ischemic or traumatic insult, subacute ischemia not excluded. No mass, hemorrhage, edema or other evidence of acute parenchymal abnormality. No extra-axial hemorrhage. Vascular: No hyperdense vessel or unexpected calcification. Skull: Normal. Negative for fracture or focal lesion. Sinuses/Orbits: Fluid/mucosal thickening within the RIGHT maxillary sinus, incompletely imaged, of uncertain chronicity. Other: None. IMPRESSION: 1. Ill-defined low-density focus within the inferior-medial LEFT occipital lobe, of uncertain etiology or chronicity, possibly representing a site of previous ischemic or traumatic insult, but subacute ischemia not excluded. Consider brain MRI for further  characterization. 2. 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 3. No intracranial hemorrhage.  No mass effect or midline shift. 4. RIGHT maxillary sinus disease, incompletely imaged, of uncertain chronicity. These results will be called to the ordering clinician or representative by the Radiologist Assistant, and communication documented in the PACS or [employer]. Electronically Signed   By: Franki Cabot M.D.   On: 06/21/2020 11:29   CT HEAD WO CONTRAST  Result Date: 06/20/2020 CLINICAL DATA:  Mental status change EXAM: CT HEAD WITHOUT CONTRAST TECHNIQUE: Contiguous axial images were obtained from the base of the skull through the vertex without intravenous contrast. COMPARISON:  CT head 05/10/2018 FINDINGS: Brain: No evidence of acute infarction, hemorrhage, hydrocephalus, extra-axial collection or mass lesion/mass effect. Vascular: Negative for hyperdense vessel Skull: Negative Sinuses/Orbits: Mucosal edema right maxillary sinus. Remaining paranasal sinuses clear. Negative orbit Other: None IMPRESSION: Normal CT head Mucosal edema right maxillary sinus. Electronically Signed   By: Franchot Gallo M.D.   On: 06/20/2020 21:10   DG Chest Port 1 View  Result Date: 06/20/2020 CLINICAL DATA:  Central line placement. EXAM: PORTABLE CHEST 1 VIEW COMPARISON:  06/18/2020 FINDINGS: New right internal jugular central venous line has its tip in the lower superior vena cava. No pneumothorax. Cardiac silhouette mildly enlarged.  No mediastinal or hilar masses. Pulmonary vascular prominence is similar to the prior study. No pulmonary edema. Lungs otherwise clear. IMPRESSION: 1. Right internal jugular central venous catheter has its tip in the lower superior vena cava. No pneumothorax. 2. No acute cardiopulmonary disease. Electronically Signed   By: Lajean Manes M.D.   On: 06/20/2020 16:10     Echo mild  cardiomyopathy ejection fraction around 40 to 45% small pericardial effusion significant LVH  TELEMETRY: Normal sinus rhythm rate of 70 nonspecific ST-T wave changes  ASSESSMENT AND PLAN:  Active Problems:   Acute renal failure (ARF) (HCC) Encephalopathy Seizure Uremia Elevated white count Diabetes Hypotension Cardiomyopathy mild systolic  Plan Agree with conservative cardiac input at this point Once patient is stabilized on dialysis would consider further cardiomyopathy therapy Coreg consider adding Entresto and/or spironolactone provided a blood pressure tolerates Do not recommend any invasive cardiac procedures at this point Continue diabetes management and control Avoid hypotension with fluid and pressors as necessary Significant coronary disease very unlikely so do not recommend cardiac cath at this point Agree with nephrology input Continue with neurology involved evaluation for probable seizure Have the patient follow-up with cardiology as an outpatient   Yolonda Kida, MD 06/21/2020 12:20 PM

## 2020-06-21 NOTE — Progress Notes (Signed)
Pharmacy Antiviral Note  Adrian Evans is a 35 y.o. 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 PMH DM1, HFrEF, CKDIII. Scr 14.94, nephro following; dialysis access placed 1/28; nephro planning for 3 HD treatments to help stabilize. Possibly starting PD - catheter planned to be placed 2/1. Pharmacy has been consulted for acyclovir dosing. Pt also on vancomycin and ampicillin for possible meningitis.   Plan: Acyclovir '495mg'$  ('5mg'$ /kg) Q24h  Monitor renal function and adjust dose as clinically indicated  Height: 6' (182.9 cm) Weight: 98.6 kg (217 lb 6 oz) IBW/kg (Calculated) : 77.6  Temp (24hrs), Avg:98.4 F (36.9 C), Min:98.1 F (36.7 C), Max:98.6 F (37 C)  Recent Labs  Lab 06/18/20 1334 06/18/20 1339 06/18/20 1508 06/18/20 1616 06/19/20 0835 06/20/20 0334 06/20/20 2036 06/21/20 0558  WBC 5.3  --   --  4.9 6.2  --   --  29.8*  CREATININE 16.42*  --   --  15.83* 16.80* 18.30* 13.83* 14.94*  LATICACIDVEN  --  0.9 0.6  --   --   --  1.1  --     Estimated Creatinine Clearance: 8.2 mL/min (A) (by C-G formula based on SCr of 14.94 mg/dL (H)).    No Known Allergies  Antimicrobials this admission: 1/29 ampicillin >>  1/29 vancomycin >> 1/29 acyclovir >>  Microbiology results: 1/26 BCx: NGTD  1/28 MRSA PCR: negative  Thank you for allowing pharmacy to be a part of this patient's care.  Sherilyn Banker, PharmD Pharmacy Resident  06/21/2020 11:23 AM

## 2020-06-21 NOTE — Progress Notes (Addendum)
Pharmacy Antibiotic Note  Adrian Evans is a 35 y.o. male admitted on 06/21/2020 with meningitis.  Pharmacy has been consulted for acyclovir and vancomycin dosing. Patient is also on ceftriaxone and ampicillin.  Patient was dialyzed at half rate 1/28 17:00-19:15 and received 2g vancomycin 1/29 AM. Will give '500mg'$  dose now and check pre-HD level.   Plan: Start acyclovir '5mg'$ /kg per IBW Q24 hr, after HD on HD days Vancomycin '500mg'$  x1 Ceftriaxone 2g Q12hr per MD  Ampicillin 2g q12hr adjusted per renal function  Monitor cultures, clinical status, renal fx, pre-HD vanc level  Narrow abx as able and f/u duration   ADDENDUM 20:00: Starting CRRT soon. Adjusted antibiotics: -Acyclovir '5mg'$ /kg per IBW Q12 hr -Vancomycin '500mg'$  x1 now then 1g Q24 hr, f/u AM level  -Ampicillin 2g Q8 hr  -Continue ceftriaxone 2g Q 12hr  -F/u CRRT tolerance, clinical work up, narrow antibitocs as able   Weight: 94.5 kg (208 lb 5.4 oz)  Temp (24hrs), Avg:98.5 F (36.9 C), Min:97.7 F (36.5 C), Max:99.8 F (37.7 C)  Recent Labs  Lab 06/18/20 1334 06/18/20 1339 06/18/20 1508 06/18/20 1616 06/19/20 0835 06/20/20 0334 06/20/20 2036 06/21/20 0558  WBC 5.3  --   --  4.9 6.2  --   --  29.8*  CREATININE 16.42*  --   --  15.83* 16.80* 18.30* 13.83* 14.94*  LATICACIDVEN  --  0.9 0.6  --   --   --  1.1  --     Estimated Creatinine Clearance: 8.1 mL/min (A) (by C-G formula based on SCr of 14.94 mg/dL (H)).    No Known Allergies  Antimicrobials this admission: CTX 1/28 >>  Vanc 1/28 >>  Ampicillin 1/28 >> Acyclovir 1/29 >>  Microbiology results: 1/26 BCx: NGTD 1/28 MRSA PCR: neg  Thank you for allowing pharmacy to be a part of this patient's care.  Benetta Spar, PharmD, BCPS, BCCP Clinical Pharmacist  Please check AMION for all [place] phone numbers After 10:00 PM, call [place] [phone]

## 2020-06-21 NOTE — Progress Notes (Signed)
eLink Physician-Brief Progress Note Patient Name: Adrian Evans DOB: [birth year] MRN: [id number]   Date of Service  06/21/2020  HPI/Events of Note  Multiple issues: 1. Hypothermia - Temp = 94.1 F. Request for [facility] and 2. Request for OGT placement.   eICU Interventions  Plan: 1. Place OGT to LIS. 2. Portable abdominal film post OGT placement.  3. Bair Hugger PRN.      Intervention Category Major Interventions: Other:  Lysle Dingwall 06/21/2020, 11:37 PM

## 2020-06-21 NOTE — Consult Note (Signed)
Neurology Consultation  Reason for Consult: Altered mental status, concern for seizures Referring Physician: Dr. Merrilee Jansky  CC: Altered mental status, concern for seizures  History is obtained from: Chart, patient  HPI: Adrian Evans is a 35 y.o. male past medical history of diabetes, CKD 3, heart failure with reduced ejection fraction of 20 to 25%, hypertension, admitted to the hospital on 06/18/2020 for generalized weakness that had started with diffuse myalgias, nausea and vomiting along with nasal congestion and diminished oral intake.  He was being treated and evaluated by medicine as well as nephrology due to AKI with a significant increase in his BUN and creatinine requiring dialysis. Yesterday night, he became more encephalopathic and difficult to arouse.  He also had an episode of seizure-like activity for which she was seen by telemedicine neurology.  He would only response to noxious stimulation with morning and passive motion of his extremities.  He will document photophobia MRI.  He exhibited some rigidity and due to concern for unresponsiveness and seizure-like activity a telemedicine consult was placed. The telemedicine neurologist witnessed left arm shaking and jerking and then spreading to the right arm and to the whole face. MRI and EEG were recommended. Patient had another episode of appearing rigid in bilateral upper extremities unable to squeeze arms and witnessed seizure activity as noted around 8:30 AM for which he received 2 mg of Ativan.  At that time left hand jerking and eye twitching and lip biting were also noted. At the time of my encounter, the patient was not exhibiting any of the activity. 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  He had been sick the week prior with  congestion symptoms of upper respiratory infection.  His Covid test was negative. In the hospital, he continued to have worsening mentation.   For the seizures, he was loaded with Keppra and started on Keppra. For the leukocytosis and concern for CNS infection, started on meningitic coverage by ICU.  Requested IR guided LP but IR unavailable today.   LKW: Unclear tpa given?: no, unclear last normal, less likely stroke-most likely seizure versus metabolic encephalopathy Premorbid modified Rankin scale (mRS): 0  ROS: Unable to ascertain due to patient's mentation  Past Medical History:  Diagnosis Date  . Diabetes mellitus without complication ([place])         Family History  Problem Relation Age of Onset  . Hypertension Mother      Social History:   reports that he has never smoked. He has never used smokeless tobacco. He reports previous alcohol use. He reports previous drug use.  Medications  Current Facility-Administered Medications:  .  0.9 %  sodium chloride infusion, 250 mL, Intravenous, Continuous, Manuella Ghazi, Vipul, MD .  acetaminophen (TYLENOL) tablet 650 mg, 650 mg, Oral, Q6H PRN, 650 mg at 06/19/20 1004 **OR** acetaminophen (TYLENOL) suppository 650 mg, 650 mg, Rectal, Q6H PRN, Nicole Kindred A, DO .  ampicillin (OMNIPEN) 2 g in sodium chloride 0.9 % 100 mL IVPB, 2 g, Intravenous, Q4H, Nelle Don, MD .  bisacodyl (DULCOLAX) EC tablet 5 mg, 5 mg, Oral, Daily PRN, Nicole Kindred A, DO .  cefTRIAXone (ROCEPHIN) 2 g in sodium chloride 0.9 % 100 mL IVPB, 2 g, Intravenous, Q12H, Nelle Don, MD .  Chlorhexidine Gluconate Cloth 2 % PADS  6 each, 6 each, Topical, Daily, Bennie Pierini, MD, 6 each at 06/20/20 1315 .  heparin injection 5,000 Units, 5,000 Units, Subcutaneous, Q8H, Ezekiel Slocumb, DO, 5,000 Units at 06/21/20 [phone] .  HYDROcodone-acetaminophen (NORCO/VICODIN) 5-325 MG per tablet 1-2 tablet, 1-2 tablet, Oral, Q6H PRN, Ezekiel Slocumb, DO, 1 tablet at  06/19/20 2247 .  insulin aspart (novoLOG) injection 0-5 Units, 0-5 Units, Subcutaneous, QHS, Griffith, Kelly A, DO .  insulin aspart (novoLOG) injection 0-9 Units, 0-9 Units, Subcutaneous, TID WC, Griffith, Kelly A, DO .  [START ON 06/22/2020] levETIRAcetam (KEPPRA) IVPB 500 mg/100 mL premix, 500 mg, Intravenous, Q24H, Rust-Chester, Britton L, NP .  LORazepam (ATIVAN) 2 MG/ML injection, , , ,  .  midodrine (PROAMATINE) tablet 10 mg, 10 mg, Oral, TID WC, Mansy, Jan A, MD, 10 mg at 06/20/20 0836 .  norepinephrine (LEVOPHED) 16 mg in 26m premix infusion, 0-40 mcg/min, Intravenous, Titrated, Rust-Chester, Britton L, NP, Last Rate: 3.75 mL/hr at 06/21/20 0600, 4 mcg/min at 06/21/20 0600 .  ondansetron (ZOFRAN) tablet 4 mg, 4 mg, Oral, Q6H PRN **OR** ondansetron (ZOFRAN) injection 4 mg, 4 mg, Intravenous, Q6H PRN, GNicole KindredA, DO, 4 mg at 06/18/20 2025 .  senna-docusate (Senokot-S) tablet 1 tablet, 1 tablet, Oral, QHS PRN, GNicole KindredA, DO .  sodium bicarbonate 150 mEq in dextrose 5 % 1,000 mL infusion, , Intravenous, Continuous, BNelle Don MD .  vancomycin (VANCOREADY) IVPB 2000 mg/400 mL, 2,000 mg, Intravenous, Once, SLu Duffel RH[facility].  vancomycin variable dose per unstable renal function (pharmacist dosing), , Does not apply, See admin instructions, [facility]  Exam: Current vital signs: BP 113/66   Pulse 87   Temp 98.6 F (37 C) (Axillary)   Resp 17   Ht 6' (1.829 m)   Wt 98.6 kg   SpO2 100%   BMI 29.48 kg/m  Vital signs in last 24 hours: Temp:  [98.6 F (37 C)] 98.6 F (37 C) (01/28 2000) Pulse Rate:  [74-88] 87 (01/29 0700) Resp:  [11-21] 17 (01/29 0700) BP: (71-171)/(47-156) 113/66 (01/29 0700) SpO2:  [96 %-100 %] 100 % (01/29 0700) Weight:  [98.6 kg] 98.6 kg (01/29 0449) General: On my examination, he had received Ativan about 20 minutes prior to that but is on no sedation.  In no apparent distress HEENT: Normocephalic, atraumatic, dry  oral mucous membranes, central line on the right neck. CVS: Regular rate rhythm Respiratory: He is protecting his airway, breathing well and saturating normally on room air although he is very very drowsy and only opens eyes to noxious stimulation. Extremities: Trace edema Neurological exam Extremely lethargic and drowsy.  Opens eyes to noxious stimulation moans in pain to touch everywhere. Does not follow commands Nonverbal Cranial nerves: Pupils are asymmetric-right pupil is 3 mm, left pupil is 2 mm-both are briskly reactive.  No gaze deviation or preference.  No roving eye movements seen.  Does not blink to threat from either side.  Face appears symmetric. Motor exam: Does not move any extremities spontaneously.  To noxious stimulation moans and exhibits mild withdrawal in all 4 extremities.  On passively outstretching arms there is some myoclonus/asterixis findings. Sensory: As above Neck appears to be supple but he yells out in pain with every passive movement of the neck or while checking the Kernig's and Brudzinski's with no gross stiffness appreciated.   Labs I have reviewed labs in epic and the results pertinent to this consultation are: White count has increased from  normal to 29.8 this morning.  CBC    Component Value Date/Time   WBC 29.8 (H) 06/21/2020 0558   RBC 4.09 (L) 06/21/2020 0558   HGB 12.8 (L) 06/21/2020 0558   HCT 35.8 (L) 06/21/2020 0558   PLT 351 06/21/2020 0558   MCV 87.5 06/21/2020 0558   MCH 31.3 06/21/2020 0558   MCHC 35.8 06/21/2020 0558   RDW 13.1 06/21/2020 0558   LYMPHSABS 0.5 (L) 06/18/2020 1334   MONOABS 0.5 06/18/2020 1334   EOSABS 0.1 06/18/2020 1334   BASOSABS 0.0 06/18/2020 1334    CMP creatinine 14.9, BUN 98, CO2 14, albumin 2.7 AST 10, GFR 4    Component Value Date/Time   NA 140 06/21/2020 0558   K 4.2 06/21/2020 0558   CL 109 06/21/2020 0558   CO2 14 (L) 06/21/2020 0558   GLUCOSE 107 (H) 06/21/2020 0558   BUN 98 (H) 06/21/2020 0558    CREATININE 14.94 (H) 06/21/2020 0558   CALCIUM 7.8 (L) 06/21/2020 0558   PROT 6.5 06/20/2020 2036   ALBUMIN 2.7 (L) 06/20/2020 2036   AST 10 (L) 06/20/2020 2036   ALT 12 06/20/2020 2036   ALKPHOS 69 06/20/2020 2036   BILITOT 2.0 (H) 06/20/2020 2036   GFRNONAA 4 (L) 06/21/2020 0558   GFRAA 47 (L) 05/27/2018 0424   Imaging I have reviewed the images obtained:  CT-scan of the brain done last night with no acute changes.  Question some subtle hypodensity around the posterior horn of the left lateral ventricle-radiology read normal.  Assessment:  35 year old man with past medical history of diabetes, CKD 3, heart failure with reduced ejection fraction, hypertension admitted on 06/18/2020 for generalized weakness and found to have severely deranged renal function-acute worsening on top of CKD 3 for which she was being treated and evaluated in the hospital by medicine and nephrology. He had sudden change in his mentation with diminished mentation yesterday and then witnessed seizure activity twice since last night for which she has received Ativan. He is currently also on Keppra 500 twice daily.  Concern for status epilepticus as he has not returned back to his baseline.  On examination, he is extremely encephalopathic. He also had some anisocoria. He did not have any seizure-like activity for me at this encounter.  He also appears to have a question of some myoclonic/asterixis activity on passive lifting of his upper extremities.  His current condition might be secondary to the severe metabolic derangement from the acute renal failure but the lethargic, change in mental status very suddenly as well as the leukocytosis concern for an infectious process including a CNS infection. He received his morning subcu heparin-unable to do an LP right at this time. He has been started on meningitic coverage.  Impression: Multifactorial toxic metabolic encephalopathy-with a major contribution from deranged  renal function Concern for status epilepticus-needs stat EEG and may be continuous EEG. Rule out CNS infection  Recommendations: Stat CT head given anisocoria. Would like to get an MRI done but he is not stable enough yet. Management of metabolic derangements including renal failure per primary team as you are.  He scheduled for dialysis today I would definitely want to get an EEG-Spot EEG and possibly prolonged overnight video EEG I would also want to evaluate his brain by imaging-brain MRI without contrast given his deranged renal function. There are no EEG services available at this hospital today, I will reach out to my colleagues at [facility] because the ICU has already reached out  to the ICU at [facility] for transfer. He will require spinal tap at some point-I have stopped his subcu heparin. He has been started on empiric meningitic coverage-agree with continuing. Added Acyclovir (pharmacy consult) for HSV encephalitis coverage. Management of his diabetes and other medical conditions per primary team as you are  Plan was discussed with Dr. Merrilee Jansky in the ICU. Discussed with Dr. Curly Shores - Neurologist @ [facility]. I will continue to follow him with you as long as he is here.  I will try to also facilitate and perform a spinal tap as schedule permits or if you are able to get assistance from IR-once he is at least 6 or so hours from his last dose of subcu heparin.  Also discussed my plan with his wife at bedside.  -- Amie Portland, MD Neurologist [facility] Pager: [phone]  CRITICAL CARE ATTESTATION Performed by: Amie Portland, MD Total critical care time: 60 minutes Critical care time was exclusive of separately billable procedures and treating other patients and/or supervising APPs/Residents/Students Critical care was necessary to treat or prevent imminent or life-threatening deterioration due to multifactorial toxic metabolic encephalopathy, concern for  seizures and status epilepticus. This patient is critically ill and at significant risk for neurological worsening and/or death and care requires constant monitoring. Critical care was time spent personally by me on the following activities: development of treatment plan with patient and/or surrogate as well as nursing, discussions with consultants, evaluation of patient's response to treatment, examination of patient, obtaining history from patient or surrogate, ordering and performing treatments and interventions, ordering and review of laboratory studies, ordering and review of radiographic studies, pulse oximetry, re-evaluation of patient's condition, participation in multidisciplinary rounds and medical decision making of high complexity in the care of this patient.

## 2020-06-21 NOTE — Care Plan (Signed)
This patient is under ICU care.  Patient is being transferred to [facility] for continuous EEG monitoring given seizures last night.  Patient also remains encephalopathic due to uremia.  We will sign off at this time.

## 2020-06-21 NOTE — Procedures (Signed)
LUMBAR PUNCTURE (SPINAL TAP) PROCEDURE NOTE  Indication: altered mental status   Proceduralists: Dr. Curly Shores, S. Toberman   Risks of the procedure were dicussed with the patient including post-LP headache, bleeding, infection, weakness/numbness of legs(radiculopathy), death.    Consent obtained from: patient spouse, Adrian Evans   Procedure Note The patient was prepped and draped, and using sterile technique a 20 gauge quinke spinal needle was inserted in the L4-5 space.  Unsuccessful in obtaining CSF or opening pressure. Unsuccessful lumbar puncture. Patient tolerated the procedure well and blood loss was minimal.    Adrian Evans, AGAC-NP [facility] Pager: [phone]

## 2020-06-21 NOTE — Progress Notes (Signed)
eLink Physician-Brief Progress Note Patient Name: Adrian Evans DOB: 1985/06/12 MRN: [id number]   Date of Service  06/21/2020  HPI/Events of Note  Hypotension - BP = 87/62. No CVL or CVP.   eICU Interventions  Plan: 1. Norepinephrine IV infusion via PIV. Titrate to MAP >= 65.      Intervention Category Major Interventions: Hypotension - evaluation and management  Cailan Antonucci Eugene 06/21/2020, 11:01 PM

## 2020-06-21 NOTE — Progress Notes (Signed)
Given report to [facility] and Marissa-(RN at [facility] ICU).

## 2020-06-21 NOTE — Progress Notes (Signed)
Patient lethargic, moaning when I touch him anywhere on his body. He can follow  simple  commands or attempt to with repeated stimulation.  minimal verbal responses with intense  questioning. L pupil +3 and brisk, R pupil +4 and sluggish- R eye also appears photophobic. Patient is rigid  BUE, unable to squeeze hands or lift arms. BLE is stiff, wiggles toes with great difficulty. R>L.NP Toribio Harbour made aware and called to bedside. witnessed Seizure like activity x2 ativan given left hand jerking, eye twitching and lip biting  Moaning. Provider made aware

## 2020-06-22 ENCOUNTER — Inpatient Hospital Stay (HOSPITAL_COMMUNITY): Payer: BC Managed Care – PPO

## 2020-06-22 DIAGNOSIS — R579 Shock, unspecified: Secondary | ICD-10-CM

## 2020-06-22 LAB — URINALYSIS, ROUTINE W REFLEX MICROSCOPIC
Bilirubin Urine: NEGATIVE
Glucose, UA: 50 mg/dL — AB
Ketones, ur: NEGATIVE mg/dL
Nitrite: NEGATIVE
Protein, ur: >=300 mg/dL — AB
RBC / HPF: >50 RBC/hpf — ABNORMAL HIGH (ref 0–5)
Specific Gravity, Urine: 1.031 — ABNORMAL HIGH (ref 1.005–1.030)
WBC, UA: >50 WBC/hpf — ABNORMAL HIGH (ref 0–5)
pH: 5 (ref 5.0–8.0)

## 2020-06-22 LAB — C-REACTIVE PROTEIN: CRP: 21.9 mg/dL — ABNORMAL HIGH (ref <1.0)

## 2020-06-22 LAB — CBC WITH DIFFERENTIAL/PLATELET
Abs Immature Granulocytes: 0.2 10*3/uL — ABNORMAL HIGH (ref 0.00–0.07)
Basophils Absolute: 0 10*3/uL (ref 0.0–0.1)
Basophils Relative: 0 %
Eosinophils Absolute: 0.1 10*3/uL (ref 0.0–0.5)
Eosinophils Relative: 0 %
HCT: 33.8 % — ABNORMAL LOW (ref 39.0–52.0)
Hemoglobin: 11.8 g/dL — ABNORMAL LOW (ref 13.0–17.0)
Immature Granulocytes: 1 %
Lymphocytes Relative: 3 %
Lymphs Abs: 0.6 10*3/uL — ABNORMAL LOW (ref 0.7–4.0)
MCH: 30.7 pg (ref 26.0–34.0)
MCHC: 34.9 g/dL (ref 30.0–36.0)
MCV: 88 fL (ref 80.0–100.0)
Monocytes Absolute: 1.6 10*3/uL — ABNORMAL HIGH (ref 0.1–1.0)
Monocytes Relative: 9 %
Neutro Abs: 16.1 10*3/uL — ABNORMAL HIGH (ref 1.7–7.7)
Neutrophils Relative %: 87 %
Platelets: 308 10*3/uL (ref 150–400)
RBC: 3.84 MIL/uL — ABNORMAL LOW (ref 4.22–5.81)
RDW: 13.4 % (ref 11.5–15.5)
WBC: 18.6 10*3/uL — ABNORMAL HIGH (ref 4.0–10.5)
nRBC: 0.1 % (ref 0.0–0.2)

## 2020-06-22 LAB — RENAL FUNCTION PANEL
Albumin: 2 g/dL — ABNORMAL LOW (ref 3.5–5.0)
Albumin: 1.9 g/dL — ABNORMAL LOW (ref 3.5–5.0)
Anion gap: 17 — ABNORMAL HIGH (ref 5–15)
Anion gap: 14 (ref 5–15)
BUN: 76 mg/dL — ABNORMAL HIGH (ref 6–20)
BUN: 58 mg/dL — ABNORMAL HIGH (ref 6–20)
CO2: 14 mmol/L — ABNORMAL LOW (ref 22–32)
CO2: 16 mmol/L — ABNORMAL LOW (ref 22–32)
Calcium: 8 mg/dL — ABNORMAL LOW (ref 8.9–10.3)
Calcium: 7.7 mg/dL — ABNORMAL LOW (ref 8.9–10.3)
Chloride: 107 mmol/L (ref 98–111)
Chloride: 107 mmol/L (ref 98–111)
Creatinine, Ser: 10.97 mg/dL — ABNORMAL HIGH (ref 0.61–1.24)
Creatinine, Ser: 8.61 mg/dL — ABNORMAL HIGH (ref 0.61–1.24)
GFR, Estimated: 6 mL/min — ABNORMAL LOW (ref >60)
GFR, Estimated: 8 mL/min — ABNORMAL LOW (ref >60)
Glucose, Bld: 111 mg/dL — ABNORMAL HIGH (ref 70–99)
Glucose, Bld: 99 mg/dL (ref 70–99)
Phosphorus: 4.3 mg/dL (ref 2.5–4.6)
Phosphorus: 2.8 mg/dL (ref 2.5–4.6)
Potassium: 3.9 mmol/L (ref 3.5–5.1)
Potassium: 3.2 mmol/L — ABNORMAL LOW (ref 3.5–5.1)
Sodium: 138 mmol/L (ref 135–145)
Sodium: 137 mmol/L (ref 135–145)

## 2020-06-22 LAB — CSF CELL COUNT WITH DIFFERENTIAL
RBC Count, CSF: 67 /mm3 — ABNORMAL HIGH
Tube #: 3
WBC, CSF: 0 /mm3 (ref 0–5)

## 2020-06-22 LAB — HEPATIC FUNCTION PANEL
ALT: 10 U/L (ref 0–44)
AST: 9 U/L — ABNORMAL LOW (ref 15–41)
Albumin: 2 g/dL — ABNORMAL LOW (ref 3.5–5.0)
Alkaline Phosphatase: 75 U/L (ref 38–126)
Bilirubin, Direct: 0.6 mg/dL — ABNORMAL HIGH (ref 0.0–0.2)
Indirect Bilirubin: 1.4 mg/dL — ABNORMAL HIGH (ref 0.3–0.9)
Total Bilirubin: 2 mg/dL — ABNORMAL HIGH (ref 0.3–1.2)
Total Protein: 6 g/dL — ABNORMAL LOW (ref 6.5–8.1)

## 2020-06-22 LAB — GLUCOSE, CAPILLARY
Glucose-Capillary: 105 mg/dL — ABNORMAL HIGH (ref 70–99)
Glucose-Capillary: 99 mg/dL (ref 70–99)
Glucose-Capillary: 97 mg/dL (ref 70–99)
Glucose-Capillary: 97 mg/dL (ref 70–99)
Glucose-Capillary: 95 mg/dL (ref 70–99)
Glucose-Capillary: 98 mg/dL (ref 70–99)
Glucose-Capillary: 101 mg/dL — ABNORMAL HIGH (ref 70–99)

## 2020-06-22 LAB — SEDIMENTATION RATE: Sed Rate: 79 mm/hr — ABNORMAL HIGH (ref 0–16)

## 2020-06-22 LAB — RAPID URINE DRUG SCREEN, HOSP PERFORMED
Amphetamines: NOT DETECTED
Barbiturates: NOT DETECTED
Benzodiazepines: POSITIVE — AB
Cocaine: NOT DETECTED
Opiates: NOT DETECTED
Tetrahydrocannabinol: NOT DETECTED

## 2020-06-22 LAB — LIPID PANEL
Cholesterol: 111 mg/dL (ref 0–200)
HDL: 31 mg/dL — ABNORMAL LOW (ref >40)
LDL Cholesterol: 44 mg/dL (ref 0–99)
Total CHOL/HDL Ratio: 3.6 RATIO
Triglycerides: 182 mg/dL — ABNORMAL HIGH (ref <150)
VLDL: 36 mg/dL (ref 0–40)

## 2020-06-22 LAB — VANCOMYCIN, RANDOM: Vancomycin Rm: 22

## 2020-06-22 LAB — GLUCOSE, CSF: Glucose, CSF: 63 mg/dL (ref 40–70)

## 2020-06-22 LAB — SODIUM, URINE, RANDOM: Sodium, Ur: 92 mmol/L

## 2020-06-22 LAB — MAGNESIUM: Magnesium: 2.1 mg/dL (ref 1.7–2.4)

## 2020-06-22 LAB — APTT: aPTT: 34 seconds (ref 24–36)

## 2020-06-22 LAB — CK: Total CK: 198 U/L (ref 49–397)

## 2020-06-22 LAB — RPR: RPR Ser Ql: NONREACTIVE

## 2020-06-22 LAB — CREATININE, URINE, RANDOM: Creatinine, Urine: 131.87 mg/dL

## 2020-06-22 LAB — TRIGLYCERIDES: Triglycerides: 165 mg/dL — ABNORMAL HIGH (ref <150)

## 2020-06-22 LAB — PROTEIN, CSF: Total  Protein, CSF: 108 mg/dL — ABNORMAL HIGH (ref 15–45)

## 2020-06-22 LAB — HIV ANTIBODY (ROUTINE TESTING W REFLEX): HIV Screen 4th Generation wRfx: NONREACTIVE

## 2020-06-22 MED ORDER — SODIUM CHLORIDE 0.9 % IV SOLN
500.0000 [IU]/h | INTRAVENOUS | Status: DC
  Administered 2020-06-22 – 2020-06-24 (×3): 500 [IU]/h via INTRAVENOUS_CENTRAL
  Filled 2020-06-22 (×2): qty 2

## 2020-06-22 MED ORDER — HEPARIN BOLUS VIA INFUSION (CRRT)
1000.0000 [IU] | INTRAVENOUS | Status: DC | PRN
  Filled 2020-06-22: qty 1000

## 2020-06-22 MED ORDER — LIDOCAINE HCL (PF) 1 % IJ SOLN: 5.0000 mL | Freq: Once | INTRAMUSCULAR | Status: DC

## 2020-06-22 MED ORDER — FENTANYL BOLUS VIA INFUSION
50.0000 ug | INTRAVENOUS | Status: DC | PRN
  Administered 2020-06-23 (×3): 50 ug via INTRAVENOUS
  Filled 2020-06-22: qty 50

## 2020-06-22 MED ORDER — FENTANYL 2500MCG IN NS 250ML (10MCG/ML) PREMIX INFUSION
50.0000 ug/h | INTRAVENOUS | Status: DC
  Administered 2020-06-22: 100 ug/h via INTRAVENOUS
  Administered 2020-06-22: 200 ug/h via INTRAVENOUS
  Administered 2020-06-24: 50 ug/h via INTRAVENOUS
  Filled 2020-06-22 (×3): qty 250

## 2020-06-22 NOTE — Progress Notes (Addendum)
LTM maint complete  . Monitored  by [facility]

## 2020-06-22 NOTE — Progress Notes (Signed)
[facility] Progress Note Patient Name: Adrian Evans DOB: 1986/04/17 MRN: [id number]   Date of Service  06/22/2020  HPI/Events of Note  ABG on 30%/PRVC 25/TV 620/P 5 = 7.276/36.3/124/17.4. Flow in expiratory phase struggling to get back to 0. Therefore, really can't push RR any further.   eICU Interventions  Continue present management.      Intervention Category Major Interventions: Respiratory failure - evaluation and management  Dalen Hennessee Eugene 06/22/2020, 12:47 AM

## 2020-06-22 NOTE — Progress Notes (Signed)
Pharmacy Antibiotic Note  Adrian Evans is a 35 y.o. male admitted on 06/21/2020 with meningitis.  Pharmacy has been consulted for acyclovir and vancomycin dosing. Patient is also on ceftriaxone and ampicillin.  Antibiotics adjusted for CRRT 1/29.  Random vancomycin level this afternoon (drawn 4 pm) = 22.  Plan: Continue vancomycin 1g q 24 hrs - next dose tonight at 10 pm. -Acyclovir '5mg'$ /kg per IBW Q12 hr -Ampicillin 2g Q8 hr  -Continue ceftriaxone 2g Q 12hr  -F/u CRRT tolerance, clinical work up, narrow antibitocs as able   Weight: 98.8 kg (217 lb 13 oz)  Temp (24hrs), Avg:96.1 F (35.6 C), Min:93.2 F (34 C), Max:99.8 F (37.7 C)  Recent Labs  Lab 06/18/20 1334 06/18/20 1339 06/18/20 1508 06/18/20 1616 06/19/20 0835 06/20/20 0334 06/20/20 2036 06/21/20 0558 06/21/20 1746 06/22/20 0513 06/22/20 1554  WBC 5.3  --   --  4.9 6.2  --   --  29.8*  --  18.6*  --   CREATININE 16.42*  --   --  15.83* 16.80*   < > 13.83* 14.94* 15.51* 10.97* 8.61*  LATICACIDVEN  --  0.9 0.6  --   --   --  1.1  --   --   --   --   VANCORANDOM  --   --   --   --   --   --   --   --   --   --  22   < > = values in this interval not displayed.    Estimated Creatinine Clearance: 14.3 mL/min (A) (by C-G formula based on SCr of 8.61 mg/dL (H)).    No Known Allergies  Antimicrobials this admission: CTX 1/28 >>  Vanc 1/28 >>  Ampicillin 1/28 >> Acyclovir 1/29 >>  Microbiology results: 1/26 BCx: NGTD 1/28 MRSA PCR: neg  Thank you for allowing pharmacy to be a part of this patient's care. [place] Crane, Roylene Reason, BCCP Clinical Pharmacist  06/22/2020 5:25 PM   [facility] pharmacy phone numbers are listed on [email or web address]

## 2020-06-22 NOTE — Progress Notes (Signed)
Patient transported to IR and back without complications. RN at bedside. ?

## 2020-06-22 NOTE — Progress Notes (Addendum)
[facility] Progress Note  Subjective: I/O + 600 cc yest, pt tolerating CRRT, creat down to 10 from 15. D/w wife at bedside.   Vitals:   06/22/20 0819 06/22/20 0821 06/22/20 0900 06/22/20 1000  BP:   (!) 143/93   Pulse: 79  74 74  Resp: (!) 25  17 (!) 25  Temp:    98.42 F (36.9 C)  TempSrc:      SpO2: 100% 100% 100% 100%  Weight:        Exam: Gen pt on vent, sedated Sclera anicteric, throat w/ ETT  No jvd or bruits Chest clear bilat to bases RRR no MRG, tachy Abd soft ntnd no mass or ascites +bs GU normal male Ext trace LE edema Neuro is sedated on the vent   Per Care Everywhere creat was          In Feb 2020 = 2.43, eGFR 38          In Oct 2021 = 3.22, eGFR 27     Date                           Creat               eGFR   Jan 2020                   2.5 >> 2.0        36- 49 stage IIIb   Feb 2020                   2.43                 38   Oct 2021                   3.22                 27 stage IV   Jun 18, 2020             16.4                    Jan 29                       14.8                 Stage V      UA pend    UNa, UCr pend     CXR 1/29 - IMPRESSION: Low lung volumes with patchy opacities, potentially atelectasis or alternatively developing infection.   Renal US 1/26 - IMPRESSION: Echogenic kidneys bilaterally consistent with medical renal disease. 12.7/ 13.3 cm length. No hydronephrosis.    Na 140  K 4.2  CO2 14  BUN 98  Cr 14.9  Ca 7.8  Alb 2.7  LFT"s ok  Tbili 2     WBC 29k  Hb 12  Hb A1C  7.4  Summary: 35 y.o. black male with diabetes mellitus type I, diabetic retinopathy, hypertension, systolic congestive heart failure who was admitted to [facility] on 06/18/2020 for acute renal failure. He had several days of gen weakness, myalgias, nausea/ vomiting w/ poor po intake per the 1/26 H&P. Continued to take his medications which included diuretics and BP medications. Typically has leg edema but on admission did not. BP's in ED were low 67/ 50.  Covid neg.      Assessment/ Plan:  1. AKI on CKD 4 - B/l creat 3.2 in Oct 2021, eGFR 27 ml/min.  Underlying CKD f/b CCKA, felt to be due to T1DM mostly.  Presented to ED 1/26 w/ BP's in 60's, pt on diuretcs/ BP lowering meds including ARB at home, had n/v several days per H&P. Suspect vol depletion/ ARB/ hypotension/ hypoperfusion main cause of AKI.  Renal US shows no evidence of obstruction. Pt had HD x 1 at [facility], then was transferred to [facility] for neuro support. Intubated here for airway protection. Started CRRT here on 1/29 yesterday. Keeping +75 cc/hr due to vol depletion, cont NS at 75 /hr. Cont CRRT.  2. Hypotension - cardiac or hypovolemic shock 3. VDRF - sedated and intubated due to AMS 4. Seizures - likely uremia +/- other causes.  Seen by neurology.  5. DMT1 - longstanding hx 6. HFrEF - EV 20- 25%     Rob Fey Coghill 06/22/2020, 11:32 AM   Recent Labs  Lab 06/21/20 1746 06/21/20 1953 06/21/20 2336 06/22/20 0513  K 3.7   < > 3.9 3.9  BUN 103*  --   --  76*  CREATININE 15.51*  --   --  10.97*  CALCIUM 7.7*  --   --  8.0*  PHOS 7.4*  --   --  4.3  HGB  --    < > 11.6* 11.8*   < > = values in this interval not displayed.   Inpatient medications: . chlorhexidine gluconate (MEDLINE KIT)  15 mL Mouth Rinse BID  . Chlorhexidine Gluconate Cloth  6 each Topical Daily  . insulin aspart  0-9 Units Subcutaneous Q4H  . mouth rinse  15 mL Mouth Rinse 10 times per day  . vancomycin variable dose per unstable renal function (pharmacist dosing)   Does not apply See admin instructions   .  prismasol BGK 4/2.5 400 mL/hr at 06/21/20 2047  .  prismasol BGK 4/2.5 200 mL/hr at 06/22/20 0925  . sodium chloride 75 mL/hr at 06/22/20 1113  . sodium chloride    . acyclovir Stopped (06/22/20 0646)  . ampicillin (OMNIPEN) IV Stopped (06/22/20 0428)  . cefTRIAXone (ROCEPHIN)  IV Stopped (06/22/20 0051)  . famotidine (PEPCID) IV Stopped (06/21/20 2306)  . fentaNYL infusion INTRAVENOUS 100 mcg/hr  (06/22/20 1113)  . heparin 10,000 units/ 20 mL infusion syringe    . levETIRAcetam Stopped (06/22/20 0018)  . norepinephrine (LEVOPHED) Adult infusion 2 mcg/min (06/22/20 1113)  . prismasol BGK 4/2.5 1,800 mL/hr at 06/22/20 1594  . propofol (DIPRIVAN) infusion 50 mcg/kg/min (06/22/20 1113)  . vancomycin     alteplase, docusate sodium, fentaNYL, fentaNYL (SUBLIMAZE) injection, heparin, heparin, ondansetron (ZOFRAN) IV, polyethylene glycol, sodium chloride

## 2020-06-22 NOTE — Plan of Care (Signed)
  Problem: Pain Managment: Goal: General experience of comfort will improve Outcome: Progressing   

## 2020-06-22 NOTE — Progress Notes (Signed)
LTM EEG reviewed at 12:50 AM. There is diffuse symmetric slowing and intermittent muscle artifact. No electrographic seizures are seen.   Electronically signed: Dr. Kerney Elbe

## 2020-06-22 NOTE — Plan of Care (Signed)
  Problem: Clinical Measurements: Goal: Ability to maintain clinical measurements within normal limits will improve Outcome: Progressing   

## 2020-06-22 NOTE — Procedures (Signed)
Patient Name: ISHANTH BIGGINS  MRN: [id number]  Epilepsy Attending: Lora Havens  Referring Physician/Provider: Dr Lesleigh Noe  Duration: 06/21/2020 2120 to 06/22/2020 2120  Patient history: 35yo M with ams. EEG to evaluate for seizure  Level of alertness: Awake, asleep  AEDs during EEG study: LEV  Technical aspects: This EEG study was done with scalp electrodes positioned according to the 10-20 International system of electrode placement. Electrical activity was acquired at a sampling rate of '500Hz'$  and reviewed with a high frequency filter of '70Hz'$  and a low frequency filter of '1Hz'$ . EEG data were recorded continuously and digitally stored.   Description: No posterior dominant rhythm was seen. Sleep was characterized by vertex waves, sleep spindles (12 to 14 Hz), maximal frontocentral region.  EEG showed continuous generalized 3 to 6 Hz theta-delta slowing.  Hyperventilation and photic stimulation were not performed.     ABNORMALITY -Continuous slow, generalized  IMPRESSION: This study is suggestive of moderate diffuse encephalopathy, nonspecific. No seizures or epileptiform discharges were seen throughout the recording.  Taeya Theall Barbra Sarks

## 2020-06-22 NOTE — Procedures (Signed)
CLINICAL DATA: [Altered mental status.] EXAM: DIAGNOSTIC LUMBAR PUNCTURE UNDER FLUOROSCOPIC GUIDANCE FLUOROSCOPY TIME: Radiation Exposure Index (as provided by the fluoroscopic device): [41.3 mGy]  If the device does not provide the exposure index: Fluoroscopy Time (in minutes and seconds): [1 minutes 18 seconds] Number of Acquired Images: [1] PROCEDURE: Informed consent was obtained from the patient prior to the procedure, including potential complications of headache, allergy, and pain. With the patient prone, the lower back was prepped with Betadine. 1% Lidocaine was used for local anesthesia. Lumbar puncture was performed at the [L2-L3] level using a [20] gauge needle with return of [clear] CSF with an opening pressure of [15] cm water. [Ten] ml of CSF were obtained for laboratory studies. The patient tolerated the procedure well and there were no apparent complications.  IMPRESSION: [Successful lumbar puncture.]

## 2020-06-22 NOTE — Progress Notes (Addendum)
Neurology Progress Note  Patient ID: Adrian Evans is a 35 y.o. with past medical history of type 1 DM, CKD 3, HFrEF, HTN admitted on 06/18/2020 for generalized weakness and found to have severely deranged renal function-acute worsening on top of CKD 3 for which he was being treated and evaluated in the hospital by medicine and nephrology. He had sudden change in his mentation with diminished mentation 1/28 and then witnessed seizure-like activity twice for which he received Ativan.  Working DDx -Toxic/metabolic due to renal failure and medications -Meningitis/Encephalitis Infectious (LP pending, on broad spectrum coverage) -Stroke (punctate lesions on MRI) -Seizure (on LTM) (note remote L occipital/parietal stroke on MRI)  Major interval events:  - Failed LP secondary to scoliosis - Intermittently hypotensive overnight, started on pressors - Hypothermic, however, found to have displaced esophageal probe this morning   Subjective: ETT in place, overall similarly grimaces to touch anywhere  Exam: Vitals:   06/22/20 0645 06/22/20 0700  BP: 110/63 119/78  Pulse: 72 75  Resp: (!) 25 (!) 25  Temp: (!) 97.16 F (36.2 C) (!) 97.34 F (36.3 C)  SpO2: 100% 100%   Gen: In bed, comfortable at rest Resp: comfortable on the vent  Cardiac: Perfusing extremities well  Abd: soft, nt Back: No hematoma or drainage at sites of LP attempt  With sedation paused at about 7:45 AM Mental Status: patient opens eyes and appears to be mouthing things to noxious stimulation.  Briefly tracks examiner.  Squeezes hand at one point but unclear if reflexive or following commands, does not like go on command. Cranial Nerves:  II: Pupils unequal, round, briskly reactive. Right pupil 4 mm/brisk, left pupil 2 mm/brisk.  III, IV, VI:  Does move eyes bilaterally, difficult to gauge vertical movements given mental status V:  Equal reaction to eyelash brush bilaterally VII: Face appears symmetric and when trying to  mouth things VIII: Hearing intact to voice (orients to voice) Motor: Tone is normal /possibly some paratonia, bulk is normal. Bilateral upper extremities are at least 3/5 (localizes to ET tube bilaterally and briskly), would not move legs to command and did not resist passive movement although he was in pain; however he did move the left lower extremity at least 2/5.  Grimaced but did not move the right lower extremity Sensation- equally reactive to noxious stimulation in all 4 extremities Coordination: unable to assess  DTRs: 2+ symmetric patellae, achilles, ankles without clonus on 1/29, not assessed today   Pertinent Labs:  Basic Metabolic Panel: Recent Labs  Lab 06/18/20 2041 06/19/20 0835 06/20/20 0334 06/20/20 2036 06/21/20 0558 06/21/20 1746 06/21/20 1953 06/21/20 2336 06/22/20 0513  NA  --  134* 138 137 140 136 137 139 138  K  --  4.3 4.8 3.4* 4.2 3.7 3.6 3.9 3.9  CL  --  109 112* 105 109 104  --   --  107  CO2  --  9* 9* 13* 14* 13*  --   --  14*  GLUCOSE  --  112* 80 95 107* 147*  --   --  111*  BUN  --  131* 125* 92* 98* 103*  --   --  76*  CREATININE  --  16.80* 18.30* 13.83* 14.94* 15.51*  --   --  10.97*  CALCIUM  --  7.4* 7.6* 8.0* 7.8* 7.7*  --   --  8.0*  MG  --  2.3  --  1.8 2.4 2.2  --   --  2.1  PHOS 9.3*  --   --  6.3* 7.3* 7.4*  --   --  4.3    CBC: Recent Labs  Lab 06/18/20 1334 06/18/20 1616 06/19/20 0835 06/21/20 0558 06/21/20 1953 06/21/20 2336 06/22/20 0513  WBC 5.3 4.9 6.2 29.8*  --   --  18.6*  NEUTROABS 4.2  --   --   --   --   --  16.1*  HGB 12.1* 12.1* 12.6* 12.8* 11.2* 11.6* 11.8*  HCT 35.5* 35.5* 36.7* 35.8* 33.0* 34.0* 33.8*  MCV 90.1 90.1 90.8 87.5  --   --  88.0  PLT 222 211 240 351  --   --  308   No results found for: ESRSEDRATE, POCTSEDRATE  No results found for: RPR No results found for: HIV1X2   Lab Results  Component Value Date   HGBA1C 7.4 (H) 06/19/2020    Lab Results  Component Value Date   TRIG 165 (H)  06/22/2020    CT-scan of the brain IMPRESSION: 1. Ill-defined low-density focus within the inferior-medial LEFT occipital lobe, of uncertain etiology or chronicity, possibly representing a site of previous ischemic or traumatic insult, but subacute ischemia not excluded. Consider brain MRI for further characterization. 2. 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 3. No intracranial hemorrhage. No mass effect or midline shift. 4. RIGHT maxillary sinus disease, incompletely imaged, of uncertain chronicity.  CT angio head or neck IMPRESSION: 1. Carotid and vertebral arteries are normal in the neck without stenosis 2. Negative for intracranial large vessel occlusion 3. Stenosis right P1 segment which may be due to patent posterior communicating artery on the right. 4. Review of CT from 1/28 and 1/29 demonstrates hypodensity in the left occipital lobe and left thalamus suspicious for recent infarction.  MRI examination of the brain 1. Multiple scattered punctate acute to early subacute ischemic white matter infarcts involving the periventricular and deep white matter of both cerebral hemispheres, right greater than left. No associated hemorrhage or mass effect. 2. 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 ] ECHO 06/19/2020 1. Left ventricular ejection fraction, by estimation, is 40 to 45%. The  left ventricle has mildly decreased function. The left ventricle  demonstrates global hypokinesis. The left ventricular internal cavity size  was moderately dilated. There is moderate  concentric left ventricular hypertrophy. Left ventricular diastolic  parameters were normal. There is the interventricular septum is flattened  in diastole ('D' shaped left ventricle), consistent with right ventricular  volume overload.  2.  Right ventricular systolic function is moderately reduced. The right  ventricular size is moderately enlarged.  3. A small pericardial effusion is present. The pericardial effusion is  posterior to the left ventricle and circumferential. There is no evidence  of cardiac tamponade.  4. The mitral valve is grossly normal. Trivial mitral valve  regurgitation.  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  6. The aortic valve is grossly normal. Aortic valve regurgitation is not  visualized.    Impression: This patient overall remains neurologically stable based on my examination today, however overall his mental status remains poor.  We will plan to continue long-term EEG monitoring for at least 1 more day and reassess tomorrow whether this can be discontinued, though at this point we have ruled out status epilepticus.  We will additionally add some rheumatological work-up to assess for other etiologies of altered  mental status given type 1 diabetes is an autoimmune disease.  Appreciate cardiology assessment for TEE.  Lumbar puncture today will be critical to assess for infection/inflammation of the CNS  Recommendations: # AMS -Resulted labs: B12 > 1000, TSH normal, Ammonia 25 on 1/28 -MRI brain completed as above (remote left PCA infarct, multiple punctate strokes) -ANA, SSA, SSB, B1, RPR, HIV, urine drug screen pending -Continue meningitis coverage ceftriaxone, ampicillin, acyclovir and vanc per pharmacy dosing -LTM EEG monitoring -- 1/29-1/30 - No seizure activity, full read pending -Continue Keppra 1000 mg daily -- will need to be adjusted based on renal function/dialysis (currently appropriate for CVVHD) -Attempted LP for CSF studies, failed at bedside, appreciate CCM or fluoroscopy assistance today  -Cell counts tubes 1 and 4, protein, glucose, bacterial culture, fungal culture, VZV IgG, VZV PCR, HSV 1/2 PCR -Appreciate nephrology consultation for renal function -Appreciate critical care  medicine management of comorbidities including diabetes and ventilator  # Multifocal punctate strokes - Stroke labs, lipid panel pending A1c 7.4% 1/27 up from 5.9% 2 years ago  - Selected hypercoag panel (Lupus anticoag panel, Beta-2 glycoprotein antibodies, cardiolipin antibodies only); other labs lower yield or difficult to interpret in the setting of CRRT - MRI brain completed as above  - CTA completed as above  - Frequent neuro checks - Echocardiogram completed as above, discuss TEE with cardiology - Hold on antiplatelet given concern for potential endocarditis  - SubQ Heparin for DVT PPx once LP completed - Risk factor modification - Telemetry monitoring - Blood pressure goal              - Normotension - PT consult, OT consult, Speech consult when stabilized   Lesleigh Noe MD-PhD [facility] [phone]   40 minutes in critical care of this patient today

## 2020-06-22 NOTE — Progress Notes (Signed)
Request received for TEE by Dr. Erin Fulling. We discussed schedule is full for Monday but may be able to be scheduled for Tuesday. I sent a message to our cardmaster/coordinator to help arrange tomorrow when endoscopy reopens. Chadrick Sprinkle PA-C

## 2020-06-22 NOTE — Progress Notes (Signed)
NAME:  Adrian Evans, MRN:  [id number], DOB:  July 15, 1985, LOS: 1 ADMISSION DATE:  06/21/2020, CONSULTATION DATE:  1/29 REFERRING MD:  [facility] CCM, CHIEF COMPLAINT:  AMS  Brief History:  35 yo M admitted to [facility] 1/26 w n/v malaise, weakness. Hypotensive in ED and volume resuscitated. Worse AMS without clear etiology, transferring to [facility] 1/29 for LP and cEEG  History of Present Illness:  35 yo M PMH HFrEF, CKD III, DMI, diabetic retinopathy, HTN presented to [facility] ED with weakness, malaise, n/v and inability to take POs. In ED, hypotensive felt to be hypovolemic and admitted to [facility] for volume resuscitation and further care. Nephro consulted for AKI on CKD vs progression to ESRD and cardiology consulted for HFrEF. On 1/28 evening PCCM consulted for AMs and stupor. This was worked up without acute CT H finding, but rigidity & seizure like activity was witnessed during a telemedicine neuro consult. 1/29 neurology rec transfer to [facility] for cEEG and LP. Patient started on acyclovir, ampicillin, rocephin and vanc 1/29.    Past Medical History:  CKD III DM I Diabetic retinopathy HFrEF  Significant Hospital Events:  1/26 admitted, in shock felt to be hypovolemic  1/27 cards consult, nephrology consult. Surgery and vascular consults for HD access 1/28 hypotensive T to ICU. AMS, sz during tele neuro eval. Rec HD 1/29 Keppra, meningitis coverage, [facility]  Consults:  Nephro Vascular (for permcath) GenSurg (for eval for PD) Cards Critical Care Neuro   Procedures:  1/28 CVC>  Significant Diagnostic Tests:   1/26 renal US: no hydronephrosis. 2.1 solid mass in R hepatic lobe ? hemangioma  ECHO 1/27> LVEF 40- 45%, LV hypokinesis, Dilated LV, moderate LVH, Interventricular septum is flatted in diastole/D shaped. RV reduces systolic function. Enlarged LV. Small pericardial effusion posterior to LV, circumferrential, no evidence of tamponade   1/28 CT H no acute intracranial  abnormality -- ill defined low density focus in L thalamus 1/28 CTA h/n> no LVO   1/29 MRI Brain 1. Multiple scattered punctate acute to early subacute ischemic white matter infarcts involving the periventricular and deep white matter of both cerebral hemispheres, right greater than left. No associated hemorrhage or mass effect. 2. 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 Micro Data:  1/26 COVID - neg  Antimicrobials:  1/29 acyclovir> 1/29 ampicillin> 1/29 rocephin> 1/29 vanc>  Interim History / Subjective:  No acute events overnight. Started on CVVH, had to start levophed this morning.   MRI brain completed last evening.   Wife at bedside, answered questions and provided update.  Objective   Blood pressure 119/78, pulse 79, temperature (!) 97.34 F (36.3 C), resp. rate (!) 25, weight 98.8 kg, SpO2 100 %.    Vent Mode: PRVC FiO2 (%):  [30 %-100 %] 30 % Set Rate:  [16 bmp-25 bmp] 25 bmp Vt Set:  [620 mL] 620 mL PEEP:  [5 cmH20] 5 cmH20 Plateau Pressure:  [13 cmH20-26 cmH20] 18 cmH20   Intake/Output Summary (Last 24 hours) at 06/22/2020 0857 Last data filed at 06/22/2020 0800 Gross per 24 hour  Intake 1791.91 ml  Output 1151 ml  Net 640.91 ml   Filed Weights   06/21/20 1537 06/22/20 0500  Weight: 94.5 kg 98.8 kg    Examination:   Physical exam: General: ill appearing young male, no acute distress, sedated, intubated, lying on the bed HEENT: [place]/AT, eyes anicteric.  ETT/OG in place, cEEG leads in  place.  Neuro: sedated, PERRL Chest: Coarse breath sounds, no wheezes or rhonchi Heart: Regular rate and rhythm, no murmurs or gallops Abdomen: Soft, nontender, nondistended, bowel sounds present Skin: No rash GU: Foley in place  Resolved Hospital Problem list     Assessment and plan:   Acute encephalopathy, multifactorial In setting of multiple scattered punctate cerebral infarcts along  with toxic metabolic derangements and concern for infection, seizures or autoimmune process. - Will perform LP today. If unsuccessful will consult IR.  -  Quantiferon and HIV negative. Hepatitis panel negative. Covid negative.  - Check urine strep pneumoniae ag - Continue empiric coverage with acyclovir, ampicillin, ceftriaxone and vancomycin  - Continue cEEG, no seizure activity noted thus far - Continue keppra  - Neurology Following - Autoimmune lab tests ordered  Shock Concern for sepsis secondary to CNS infection.  - antibiotics as above - check EKG - Keeping net even on CRRT - Continue levophed to maintain MAP >65  Multiple Cerebral Infarcts Unknown etiology at this time - Neurology following - Cardiology consulted for TEE  Acute renal failure on CKD 3a Anion gap metabolic acidosis Oliguric - Nephrology is consulted, continue CRRT  Acute hypoxic respiratory failure, inability to protect airway due to altered mental status - Continue mechanical ventilation - VAP bundle - fentanyl for analgesia and propofol for sedation  Acute on chronic systolic/diastolic congestive heart failure Patient echocardiogram is suggestive of EF 40 to 45%, dilated LV and global hypokinesis associated with diastolic dysfunction Started on aspirin Holding meds for now  Diabetes type I Continue sliding scale insulin Fingerstick goal 140-180  Best practice (evaluated daily)  Diet: NPO, will plan to start TF later today Pain/Anxiety/Delirium protocol (if indicated): Propofol VAP protocol (if indicated): Ordered DVT prophylaxis: SCD GI prophylaxis: Famotidine Glucose control: SSI Mobility: Bedrest Disposition: ICU  Goals of Care:  Last date of multidisciplinary goals of care discussion: 1/30 Family and staff present: Wife, RN and Dr. Erin Fulling Summary of discussion: Continue aggressive measures Follow up goals of care discussion due: 06/29/20 Code Status: Full code  Labs   CBC: Recent  Labs  Lab 06/18/20 1334 06/18/20 1616 06/19/20 0835 06/21/20 0558 06/21/20 1953 06/21/20 2336 06/22/20 0513  WBC 5.3 4.9 6.2 29.8*  --   --  18.6*  NEUTROABS 4.2  --   --   --   --   --  16.1*  HGB 12.1* 12.1* 12.6* 12.8* 11.2* 11.6* 11.8*  HCT 35.5* 35.5* 36.7* 35.8* 33.0* 34.0* 33.8*  MCV 90.1 90.1 90.8 87.5  --   --  88.0  PLT 222 211 240 351  --   --  [id number]    Basic Metabolic Panel: Recent Labs  Lab 06/18/20 2041 06/19/20 0835 06/20/20 0334 06/20/20 2036 06/21/20 0558 06/21/20 1746 06/21/20 1953 06/21/20 2336 06/22/20 0513  NA  --  134* 138 137 140 136 137 139 138  K  --  4.3 4.8 3.4* 4.2 3.7 3.6 3.9 3.9  CL  --  109 112* 105 109 104  --   --  107  CO2  --  9* 9* 13* 14* 13*  --   --  14*  GLUCOSE  --  112* 80 95 107* 147*  --   --  111*  BUN  --  131* 125* 92* 98* 103*  --   --  76*  CREATININE  --  16.80* 18.30* 13.83* 14.94* 15.51*  --   --  10.97*  CALCIUM  --  7.4* 7.6* 8.0*  7.8* 7.7*  --   --  8.0*  MG  --  2.3  --  1.8 2.4 2.2  --   --  2.1  PHOS 9.3*  --   --  6.3* 7.3* 7.4*  --   --  4.3   GFR: Estimated Creatinine Clearance: 11.2 mL/min (A) (by C-G formula based on SCr of 10.97 mg/dL (H)). Recent Labs  Lab 06/18/20 1339 06/18/20 1508 06/18/20 1616 06/19/20 0835 06/20/20 2036 06/21/20 0558 06/21/20 1746 06/22/20 0513  PROCALCITON  --   --   --   --  2.05 4.27 5.59  --   WBC  --   --  4.9 6.2  --  29.8*  --  18.6*  LATICACIDVEN 0.9 0.6  --   --  1.1  --   --   --     Liver Function Tests: Recent Labs  Lab 06/18/20 1334 06/20/20 2036 06/21/20 1746 06/22/20 0513  AST 6* 10* 8* 9*  ALT '12 12 12 10  '$ ALKPHOS 78 69 73 75  BILITOT 1.2 2.0* 1.8* 2.0*  PROT 6.2* 6.5 5.9* 6.0*  ALBUMIN 2.6* 2.7* 2.3* 2.0*  2.0*   Recent Labs  Lab 06/21/20 1746  LIPASE 22  AMYLASE 46   Recent Labs  Lab 06/20/20 2036  AMMONIA 25    ABG    Component Value Date/Time   PHART 7.276 (L) 06/21/2020 2336   PCO2ART 36.3 06/21/2020 2336   PO2ART 124 (H)  06/21/2020 2336   HCO3 17.4 (L) 06/21/2020 2336   TCO2 19 (L) 06/21/2020 2336   ACIDBASEDEF 9.0 (H) 06/21/2020 2336   O2SAT 99.0 06/21/2020 2336     Coagulation Profile: Recent Labs  Lab 06/18/20 1334  INR 1.2    Cardiac Enzymes: Recent Labs  Lab 06/18/20 1508 06/22/20 0513  CKTOTAL 397 198    HbA1C: Hgb A1c MFr Bld  Date/Time Value Ref Range Status  06/19/2020 08:35 AM 7.4 (H) 4.8 - 5.6 % Final    Comment:    (NOTE) Pre diabetes:          5.7%-6.4%  Diabetes:              >6.4%  Glycemic control for   <7.0% adults with diabetes   05/22/2018 10:58 PM 5.9 (H) 4.8 - 5.6 % Final    Comment:    (NOTE) Pre diabetes:          5.7%-6.4% Diabetes:              >6.4% Glycemic control for   <7.0% adults with diabetes     CBG: Recent Labs  Lab 06/21/20 1124 06/21/20 2005 06/21/20 2320 06/22/20 0312 06/22/20 0723  GLUCAP 142* 145* 125* 105* 99    Total critical care time: 45 minutes  Freda Jackson, MD [facility] Office: [phone]   See Amion for Pager Details

## 2020-06-23 ENCOUNTER — Inpatient Hospital Stay (HOSPITAL_COMMUNITY): Payer: BC Managed Care – PPO

## 2020-06-23 DIAGNOSIS — I340 Nonrheumatic mitral (valve) insufficiency: Secondary | ICD-10-CM

## 2020-06-23 DIAGNOSIS — I313 Pericardial effusion (noninflammatory): Secondary | ICD-10-CM

## 2020-06-23 LAB — PROTEIN ELECTROPHORESIS, SERUM
A/G Ratio: 0.8 (ref 0.7–1.7)
Albumin ELP: 2.5 g/dL — ABNORMAL LOW (ref 2.9–4.4)
Alpha-1-Globulin: 0.3 g/dL (ref 0.0–0.4)
Alpha-2-Globulin: 0.9 g/dL (ref 0.4–1.0)
Beta Globulin: 0.7 g/dL (ref 0.7–1.3)
Gamma Globulin: 1 g/dL (ref 0.4–1.8)
Globulin, Total: 3 g/dL (ref 2.2–3.9)
Total Protein ELP: 5.5 g/dL — ABNORMAL LOW (ref 6.0–8.5)

## 2020-06-23 LAB — STREP PNEUMONIAE URINARY ANTIGEN: Strep Pneumo Urinary Antigen: NEGATIVE

## 2020-06-23 LAB — CBC WITH DIFFERENTIAL/PLATELET
Abs Immature Granulocytes: 0.17 10*3/uL — ABNORMAL HIGH (ref 0.00–0.07)
Basophils Absolute: 0 10*3/uL (ref 0.0–0.1)
Basophils Relative: 0 %
Eosinophils Absolute: 0.2 10*3/uL (ref 0.0–0.5)
Eosinophils Relative: 2 %
HCT: 30.2 % — ABNORMAL LOW (ref 39.0–52.0)
Hemoglobin: 10.7 g/dL — ABNORMAL LOW (ref 13.0–17.0)
Immature Granulocytes: 2 %
Lymphocytes Relative: 6 %
Lymphs Abs: 0.6 10*3/uL — ABNORMAL LOW (ref 0.7–4.0)
MCH: 31.6 pg (ref 26.0–34.0)
MCHC: 35.4 g/dL (ref 30.0–36.0)
MCV: 89.1 fL (ref 80.0–100.0)
Monocytes Absolute: 0.8 10*3/uL (ref 0.1–1.0)
Monocytes Relative: 8 %
Neutro Abs: 8.4 10*3/uL — ABNORMAL HIGH (ref 1.7–7.7)
Neutrophils Relative %: 82 %
Platelets: 331 10*3/uL (ref 150–400)
RBC: 3.39 MIL/uL — ABNORMAL LOW (ref 4.22–5.81)
RDW: 13.9 % (ref 11.5–15.5)
WBC: 10.2 10*3/uL (ref 4.0–10.5)
nRBC: 0.5 % — ABNORMAL HIGH (ref 0.0–0.2)

## 2020-06-23 LAB — RENAL FUNCTION PANEL
Albumin: 1.8 g/dL — ABNORMAL LOW (ref 3.5–5.0)
Albumin: 1.8 g/dL — ABNORMAL LOW (ref 3.5–5.0)
Anion gap: 14 (ref 5–15)
Anion gap: 14 (ref 5–15)
BUN: 41 mg/dL — ABNORMAL HIGH (ref 6–20)
BUN: 31 mg/dL — ABNORMAL HIGH (ref 6–20)
CO2: 16 mmol/L — ABNORMAL LOW (ref 22–32)
CO2: 20 mmol/L — ABNORMAL LOW (ref 22–32)
Calcium: 7.7 mg/dL — ABNORMAL LOW (ref 8.9–10.3)
Calcium: 7.7 mg/dL — ABNORMAL LOW (ref 8.9–10.3)
Chloride: 108 mmol/L (ref 98–111)
Chloride: 102 mmol/L (ref 98–111)
Creatinine, Ser: 6.05 mg/dL — ABNORMAL HIGH (ref 0.61–1.24)
Creatinine, Ser: 4.95 mg/dL — ABNORMAL HIGH (ref 0.61–1.24)
GFR, Estimated: 11 mL/min — ABNORMAL LOW (ref >60)
GFR, Estimated: 15 mL/min — ABNORMAL LOW (ref >60)
Glucose, Bld: 105 mg/dL — ABNORMAL HIGH (ref 70–99)
Glucose, Bld: 124 mg/dL — ABNORMAL HIGH (ref 70–99)
Phosphorus: 2.1 mg/dL — ABNORMAL LOW (ref 2.5–4.6)
Phosphorus: 2.1 mg/dL — ABNORMAL LOW (ref 2.5–4.6)
Potassium: 3.9 mmol/L (ref 3.5–5.1)
Potassium: 3.8 mmol/L (ref 3.5–5.1)
Sodium: 138 mmol/L (ref 135–145)
Sodium: 136 mmol/L (ref 135–145)

## 2020-06-23 LAB — CSF CELL COUNT WITH DIFFERENTIAL
RBC Count, CSF: 30 /mm3 — ABNORMAL HIGH
Tube #: 1
WBC, CSF: 1 /mm3 (ref 0–5)

## 2020-06-23 LAB — GLUCOSE, CAPILLARY
Glucose-Capillary: 102 mg/dL — ABNORMAL HIGH (ref 70–99)
Glucose-Capillary: 100 mg/dL — ABNORMAL HIGH (ref 70–99)
Glucose-Capillary: 119 mg/dL — ABNORMAL HIGH (ref 70–99)
Glucose-Capillary: 115 mg/dL — ABNORMAL HIGH (ref 70–99)
Glucose-Capillary: 200 mg/dL — ABNORMAL HIGH (ref 70–99)
Glucose-Capillary: 211 mg/dL — ABNORMAL HIGH (ref 70–99)

## 2020-06-23 LAB — TROPONIN I (HIGH SENSITIVITY)
Troponin I (High Sensitivity): 33 ng/L — ABNORMAL HIGH (ref <18)
Troponin I (High Sensitivity): 33 ng/L — ABNORMAL HIGH (ref <18)

## 2020-06-23 LAB — KAPPA/LAMBDA LIGHT CHAINS
Kappa free light chain: 288.9 mg/L — ABNORMAL HIGH (ref 3.3–19.4)
Kappa, lambda light chain ratio: 1.19 (ref 0.26–1.65)
Lambda free light chains: 243.5 mg/L — ABNORMAL HIGH (ref 5.7–26.3)

## 2020-06-23 LAB — CULTURE, BLOOD (SINGLE): Culture: NO GROWTH

## 2020-06-23 LAB — APTT: aPTT: 28 seconds (ref 24–36)

## 2020-06-23 LAB — MAGNESIUM: Magnesium: 2.2 mg/dL (ref 1.7–2.4)

## 2020-06-23 MED ORDER — PROSOURCE TF PO LIQD
90.0000 mL | Freq: Four times a day (QID) | ORAL | Status: DC
  Administered 2020-06-23 (×3): 90 mL
  Filled 2020-06-23 (×4): qty 90

## 2020-06-23 MED ORDER — DEXTROSE 5 % IV SOLN
10.0000 mg/kg | Freq: Two times a day (BID) | INTRAVENOUS | Status: DC
  Administered 2020-06-23 – 2020-06-25 (×4): 775 mg via INTRAVENOUS
  Filled 2020-06-23 (×5): qty 15.5

## 2020-06-23 MED ORDER — NOREPINEPHRINE 4 MG/250ML-% IV SOLN
2.0000 ug/min | INTRAVENOUS | Status: DC
  Administered 2020-06-23: 2 ug/min via INTRAVENOUS
  Filled 2020-06-23: qty 250

## 2020-06-23 MED ORDER — PIVOT 1.5 CAL PO LIQD
1000.0000 mL | ORAL | Status: DC
  Administered 2020-06-23: 1000 mL
  Filled 2020-06-23: qty 1000

## 2020-06-23 MED ORDER — PERFLUTREN LIPID MICROSPHERE
1.0000 mL | INTRAVENOUS | Status: AC | PRN
  Administered 2020-06-23: 2 mL via INTRAVENOUS
  Filled 2020-06-23: qty 10

## 2020-06-23 MED ORDER — SODIUM CHLORIDE 0.9 % IV SOLN: INTRAVENOUS | Status: DC

## 2020-06-23 MED ORDER — VITAL HIGH PROTEIN PO LIQD: 1000.0000 mL | ORAL | Status: DC

## 2020-06-23 MED ORDER — PROSOURCE TF PO LIQD: 45.0000 mL | Freq: Two times a day (BID) | ORAL | Status: DC

## 2020-06-23 NOTE — Progress Notes (Signed)
Initial Nutrition Assessment  DOCUMENTATION CODES:   Not applicable  INTERVENTION:   Initiate tube feeding via OG tube: Pivot 1.5 at 50 ml/h (1200 ml per day) Prosource TF 90 ml QID  Provides 2120 kcal, 200 gm protein, 910 ml free water daily  TF regimen and propofol at current rate providing 2568 total kcal/day     NUTRITION DIAGNOSIS:   Inadequate oral intake related to inability to eat as evidenced by NPO status.  GOAL:   Patient will meet greater than or equal to 90% of their needs  MONITOR:   TF tolerance,Vent status,Labs  REASON FOR ASSESSMENT:   Consult,Ventilator Enteral/tube feeding initiation and management  ASSESSMENT:   Pt with PMH of type 1 DM, CHF, CKD stage 3b, diabetic retinopathy, and HTN who was admitted with N/V, AKI, and, acute encephalopathy.    Pt discussed during ICU rounds and with RN.  Pt found to have multiple scattered punctate cerebral infarcts and metabolic encephalopathy with concern for seizures vs autoimmune process vs meningitis.  Currently off pressors Plan for TEE today then start TF.  Nephrology following for CRRT management unsure if AKI on CKD vs progression to ESRD  1/29 CRRT started  1/30 s/p LP  Patient is currently intubated on ventilator support MV: 15.2 L/min Temp (24hrs), Avg:98.2 F (36.8 C), Min:96.08 F (35.6 C), Max:98.96 F (37.2 C)  Propofol: 17 ml/hr provides: 448 kcal  Medications reviewed and include: SSI Fentanyl Levophed on/off 1 mcg  Labs reviewed: BUN: 41, Cr: 6.05, PO4: 2.1 CBG's: 100-119   UOP: 50 ml I&O: +2100 ml  CRRT: +75 ml/hr OG tube: tip distal stomach  NUTRITION - FOCUSED PHYSICAL EXAM:    Diet Order:   Diet Order            Diet NPO time specified  Diet effective now                 EDUCATION NEEDS:   No education needs have been identified at this time  Skin:  Skin Assessment: Reviewed RN Assessment  Last BM:  1/27  Height:   Ht Readings from Last 1  Encounters:  06/18/20 6' (1.829 m)    Weight:   Wt Readings from Last 1 Encounters:  06/23/20 96.5 kg    Ideal Body Weight:  80.9 kg  BMI:  Body mass index is 28.85 kg/m.  Estimated Nutritional Needs:   Kcal:  2300  Protein:  185-235 grams  Fluid:  2 L/day  Lockie Pares., RD, LDN, CNSC See AMiON for contact information

## 2020-06-23 NOTE — Procedures (Signed)
Patient Name: ASHWATH CHAU  MRN: [id number]  Epilepsy Attending: Lora Havens  Referring Physician/Provider: Dr Lesleigh Noe  Duration: 06/22/2020 2120 to 06/23/2020 2120  Patient history: 35yo M with ams. EEG to evaluate for seizure  Level of alertness: Awake, asleep  AEDs during EEG study: LEV  Technical aspects: This EEG study was done with scalp electrodes positioned according to the 10-20 International system of electrode placement. Electrical activity was acquired at a sampling rate of '500Hz'$  and reviewed with a high frequency filter of '70Hz'$  and a low frequency filter of '1Hz'$ . EEG data were recorded continuously and digitally stored.   Description: No posterior dominant rhythm was seen. Sleep was characterized by vertex waves, sleep spindles (12 to 14 Hz), maximal frontocentral region.  EEG showed continuous generalized 3 to 6 Hz theta-delta slowing.  Hyperventilation and photic stimulation were not performed.     Event button was pressed on 06/23/2020 at 1801 during which patient was noted to have upward and left gaze deviation.  Concomitant EEG before, during and after the event did not show any EEG change.  ABNORMALITY -Continuous slow, generalized  IMPRESSION: This study is suggestive of moderate diffuse encephalopathy, nonspecific. No seizures or epileptiform discharges were seen throughout the recording.  Event button was pressed on 06/23/2020 at 1801 during which patient was noted upon her left gaze deviation without concomitant EEG change I was most likely not an epileptic event.  Kalsey Lull Barbra Sarks

## 2020-06-23 NOTE — Progress Notes (Signed)
Pharmacy Antibiotic Note  Adrian Evans is a 35 y.o. male admitted on 06/21/2020 with meningitis.  Pharmacy has been consulted for acyclovir and vancomycin dosing. Patient is also on ceftriaxone and ampicillin.  Antibiotics adjusted for CRRT 1/29. WBC has normalized. Hypothermia resolved   Plan: -Continue vancomycin 1g q 24 hrs - next dose tonight at 10 pm. -Increase Acyclovir to '1mg'$ /kg per IBW Q12 hr while on CRRT  -Ampicillin 2g Q8 hr  -Continue ceftriaxone 2g Q 12hr  -F/u CRRT tolerance, clinical work up, narrow antibitocs as able   Weight: 96.5 kg (212 lb 11.9 oz)  Temp (24hrs), Avg:98.1 F (36.7 C), Min:96.08 F (35.6 C), Max:98.96 F (37.2 C)  Recent Labs  Lab 06/18/20 1339 06/18/20 1508 06/18/20 1616 06/19/20 0835 06/20/20 0334 06/20/20 2036 06/21/20 0558 06/21/20 1746 06/22/20 0513 06/22/20 1554 06/23/20 0337  WBC  --   --  4.9 6.2  --   --  29.8*  --  18.6*  --  10.2  CREATININE  --   --  15.83* 16.80*   < > 13.83* 14.94* 15.51* 10.97* 8.61* 6.05*  LATICACIDVEN 0.9 0.6  --   --   --  1.1  --   --   --   --   --   VANCORANDOM  --   --   --   --   --   --   --   --   --  22  --    < > = values in this interval not displayed.    Estimated Creatinine Clearance: 20.1 mL/min (A) (by C-G formula based on SCr of 6.05 mg/dL (H)).    No Known Allergies  Antimicrobials this admission: CTX 1/28 >>  Vanc 1/28 >>  Ampicillin 1/28 >> Acyclovir 1/29 >>  Microbiology results: 1/29 COVID neg 1/30 Strep pneumo>> 1/30 CSF counts > glucose wnl, protein high, WBC neg  1/30 HSV PCR >>  1/30 CSF fungal smear >>  1/30 CSF cx >>  1/30 Varicella PCR >>   Albertina Parr, PharmD., BCPS, BCCCP Clinical Pharmacist Please refer to [facility] for unit-specific pharmacist

## 2020-06-23 NOTE — Progress Notes (Signed)
  Echocardiogram Echocardiogram Transesophageal has been performed with Definity.  Adrian Evans 06/23/2020, 11:11 AM

## 2020-06-23 NOTE — Progress Notes (Signed)
    [facility] has been requested to perform a transesophageal echocardiogram on Adrian Evans for stroke.  After careful review of history and examination, the risks and benefits of transesophageal echocardiogram have been explainedto the patients wife including risks of esophageal damage, perforation (1:10,000 risk), bleeding, pharyngeal hematoma as well as other potential complications associated with conscious sedation including aspiration, arrhythmia, respiratory failure and death. Alternatives to treatment were discussed, questions were answered. Patients wife is willing to proceed.   Kathyrn Drown, NP  06/23/2020 9:26 AM

## 2020-06-23 NOTE — Progress Notes (Signed)
LTM maint complete - no skin breakdown under: fp1, fp2, ekgs

## 2020-06-23 NOTE — Progress Notes (Addendum)
NAME:  Adrian Evans, MRN:  [id number], DOB:  August 13, 1985, LOS: 2 ADMISSION DATE:  06/21/2020, CONSULTATION DATE:  1/29 REFERRING MD:  [facility] CCM, CHIEF COMPLAINT:  AMS  Brief History:  35 yo M admitted to [facility] 1/26 w n/v malaise, weakness. Hypotensive in ED and volume resuscitated. Worse AMS without clear etiology, transferring to [facility] 1/29 for LP and cEEG  History of Present Illness:  35 yo M PMH HFrEF, CKD III, DMI, diabetic retinopathy, HTN presented to [facility] ED with weakness, malaise, n/v and inability to take POs. In ED, hypotensive felt to be hypovolemic and admitted to [facility] for volume resuscitation and further care. Nephro consulted for AKI on CKD vs progression to ESRD and cardiology consulted for HFrEF. On 1/28 evening PCCM consulted for AMs and stupor. This was worked up without acute CT H finding, but rigidity & seizure like activity was witnessed during a telemedicine neuro consult. 1/29 neurology rec transfer to [facility] for cEEG and LP. Patient started on acyclovir, ampicillin, rocephin and vanc 1/29.    Past Medical History:  CKD III DM I Diabetic retinopathy HFrEF  Significant Hospital Events:  1/26 admitted, in shock felt to be hypovolemic  1/27 cards consult, nephrology consult. Surgery and vascular consults for HD access 1/28 hypotensive T to ICU. AMS, sz during tele neuro eval. Rec HD 1/29 Keppra, meningitis coverage, [facility] LP 1/30 LP performed by IR, MRI showed multiple cerebral infarcts  Consults:  Nephro Vascular (for permcath) GenSurg (for eval for PD) Cards Critical Care Neuro   Procedures:  1/30 LP 1/28 CVC>  Significant Diagnostic Tests:   1/26 renal US: no hydronephrosis. 2.1 solid mass in R hepatic lobe ? hemangioma  ECHO 1/27> LVEF 40- 45%, LV hypokinesis, Dilated LV, moderate LVH, Interventricular septum is flatted in diastole/D shaped. RV reduces systolic function. Enlarged LV. Small pericardial effusion posterior to LV,  circumferrential, no evidence of tamponade   1/28 CT H no acute intracranial abnormality -- ill defined low density focus in L thalamus 1/28 CTA h/n> no LVO   1/29 MRI Brain 1. Multiple scattered punctate acute to early subacute ischemic white matter infarcts involving the periventricular and deep white matter of both cerebral hemispheres, right greater than left. No associated hemorrhage or mass effect. 2. 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   Micro Data:  1/26 COVID - neg  Antimicrobials:  1/29 acyclovir> 1/29 ampicillin> 1/29 rocephin> 1/29 vanc>  Interim History / Subjective:  No acute events overnight, patient is now off levophed. LP performed by IR yesterday. Intubated and sedated.   Objective   Blood pressure 91/75, pulse 71, temperature 98.6 F (37 C), resp. rate (!) 25, weight 96.5 kg, SpO2 100 %.    Vent Mode: PRVC FiO2 (%):  [30 %-40 %] 30 % Set Rate:  [25 bmp] 25 bmp Vt Set:  [620 mL] 620 mL PEEP:  [5 cmH20] 5 cmH20 Plateau Pressure:  [15 cmH20-22 cmH20] 22 cmH20   Intake/Output Summary (Last 24 hours) at 06/23/2020 0731 Last data filed at 06/23/2020 0700 Gross per 24 hour  Intake 3669.77 ml  Output 1549 ml  Net 2120.77 ml   Filed Weights   06/21/20 1537 06/22/20 0500 06/23/20 0500  Weight: 94.5 kg 98.8 kg 96.5 kg    Examination: General: Critically ill appearing male, intubated and sedated, on CRRT HEENT: [place]/AT, eyes anicteric, OG and ETT in place, cEEG leads in place, Right IJ  in place  Cardiac: RRR, no m/r/g Pulmonary: Coarse breath sounds, no wheezing, rhonchi, or rales Abdomen: Soft, non-tender, non-distended, normoactive bowl sounds Extremity: No LE swelling, SCDs in place Neuro: Sedated, [facility] Problem list     Assessment and plan:   Acute encephalopathy, multifactorial 2/2 multiple scattered punctate cerebral infarcts plus metabolic  encephalopathy. Also with concern for seizures vs autoimmune process vs meningitis. LP results concerning for a viral meningitis.  - S/p LP on 1/30 by IR - LP results: glucose 63, RBC 67, total protein 108, 0 WBCs. Opening pressure of 15.  -  Quantiferon and HIV negative. Hepatitis panel negative. Covid negative.  - F/u urine strep pneumoniae ag - Continue empiric coverage with acyclovir, ampicillin, ceftriaxone and vancomycin  - Continue cEEG, no seizure activity noted thus far - Continue keppra  - Neurology Following - Autoimmune lab tests: ANA and ANCA negative. Sjogrens, cardiolipin, Beta-2-glycoprotein, lupus: pending  Shock Concern for sepsis secondary to CNS infection vs hypovolemic shock. Has not required levophed overnight. BP has remained stable. EKG showed normal sinus rhythm, HR 70, prolonged PR interval, with new TWIs in V2-V6.   - Continue antibiotics as above - Keeping net even on CRRT - Check troponins - Continue levophed PRN, not currently requiring this, can discontinue later today if MAP persistently >65  Multiple Cerebral Infarcts Unknown etiology at this time. Cardiology consulted for TEE, will follow up to see if TEE can be done today.   - Neurology following - Cardiology consulted for TEE - reported that TEE can likely be done later today, will hold off on TFs for now  Acute renal failure on CKD 3a Anion gap metabolic acidosis Oliguric Labs improving on CRRT.   - Nephrology is consulted -Continue CRRT  Acute hypoxic respiratory failure, inability to protect airway due to altered mental status Current ventilator setting are RR 25, Vt 620, PEEP 5, FiO2 30%. Oxygenating well. Patient was making purposeful movements when moved per nursing, not following commands. Will need daily SBT.   - Continue mechanical ventilation - VAP bundle - fentanyl for analgesia and propofol for sedation - Wean sedation as able - Mental status precluding extubation, will need to  assess off sedation  Acute on chronic systolic/diastolic congestive heart failure Patient echocardiogram is suggestive of EF 40 to 45%, dilated LV and global hypokinesis associated with diastolic dysfunction. EKG with new TWIs as noted above. Checking troponin.   -Continue Aspirin -Holding meds for now  Diabetes type I CBGs around 100s. Holding tube feeds for TEE  -Continue sliding scale insulin -Fingerstick goal 140-180  Best practice (evaluated daily)  Diet: NPO, TF following TEE Pain/Anxiety/Delirium protocol (if indicated): Propofol VAP protocol (if indicated): Ordered DVT prophylaxis: SCD GI prophylaxis: Famotidine Glucose control: SSI Mobility: Bedrest Disposition: ICU  Goals of Care:  Last date of multidisciplinary goals of care discussion: 1/30 Family and staff present: Wife, RN and Dr. Erin Fulling Summary of discussion: Continue aggressive measures Follow up goals of care discussion due: 06/29/20 Code Status: Full code  Labs   CBC: Recent Labs  Lab 06/18/20 1334 06/18/20 1616 06/19/20 0835 06/21/20 0558 06/21/20 1953 06/21/20 2336 06/22/20 0513 06/23/20 0337  WBC 5.3 4.9 6.2 29.8*  --   --  18.6* 10.2  NEUTROABS 4.2  --   --   --   --   --  16.1* 8.4*  HGB 12.1* 12.1* 12.6* 12.8* 11.2* 11.6* 11.8* 10.7*  HCT 35.5* 35.5* 36.7* 35.8* 33.0* 34.0* 33.8* 30.2*  MCV  90.1 90.1 90.8 87.5  --   --  88.0 89.1  PLT 222 211 240 351  --   --  308 [id number]    Basic Metabolic Panel: Recent Labs  Lab 06/20/20 2036 06/21/20 0558 06/21/20 1746 06/21/20 1953 06/21/20 2336 06/22/20 0513 06/22/20 1554 06/23/20 0337  NA 137 140 136 137 139 138 137 138  K 3.4* 4.2 3.7 3.6 3.9 3.9 3.2* 3.9  CL 105 109 104  --   --  107 107 108  CO2 13* 14* 13*  --   --  14* 16* 16*  GLUCOSE 95 107* 147*  --   --  111* 99 105*  BUN 92* 98* 103*  --   --  76* 58* 41*  CREATININE 13.83* 14.94* 15.51*  --   --  10.97* 8.61* 6.05*  CALCIUM 8.0* 7.8* 7.7*  --   --  8.0* 7.7* 7.7*  MG 1.8 2.4 2.2   --   --  2.1  --  2.2  PHOS 6.3* 7.3* 7.4*  --   --  4.3 2.8 2.1*   GFR: Estimated Creatinine Clearance: 20.1 mL/min (A) (by C-G formula based on SCr of 6.05 mg/dL (H)). Recent Labs  Lab 06/18/20 1339 06/18/20 1508 06/18/20 1616 06/19/20 0835 06/20/20 2036 06/21/20 0558 06/21/20 1746 06/22/20 0513 06/23/20 0337  PROCALCITON  --   --   --   --  2.05 4.27 5.59  --   --   WBC  --   --    < > 6.2  --  29.8*  --  18.6* 10.2  LATICACIDVEN 0.9 0.6  --   --  1.1  --   --   --   --    < > = values in this interval not displayed.    Liver Function Tests: Recent Labs  Lab 06/18/20 1334 06/20/20 2036 06/21/20 1746 06/22/20 0513 06/22/20 1554 06/23/20 0337  AST 6* 10* 8* 9*  --   --   ALT '12 12 12 10  '$ --   --   ALKPHOS 78 69 73 75  --   --   BILITOT 1.2 2.0* 1.8* 2.0*  --   --   PROT 6.2* 6.5 5.9* 6.0*  --   --   ALBUMIN 2.6* 2.7* 2.3* 2.0*  2.0* 1.9* 1.8*   Recent Labs  Lab 06/21/20 1746  LIPASE 22  AMYLASE 46   Recent Labs  Lab 06/20/20 2036  AMMONIA 25    ABG    Component Value Date/Time   PHART 7.276 (L) 06/21/2020 2336   PCO2ART 36.3 06/21/2020 2336   PO2ART 124 (H) 06/21/2020 2336   HCO3 17.4 (L) 06/21/2020 2336   TCO2 19 (L) 06/21/2020 2336   ACIDBASEDEF 9.0 (H) 06/21/2020 2336   O2SAT 99.0 06/21/2020 2336     Coagulation Profile: Recent Labs  Lab 06/18/20 1334  INR 1.2    Cardiac Enzymes: Recent Labs  Lab 06/18/20 1508 06/22/20 0513  CKTOTAL 397 198    HbA1C: Hgb A1c MFr Bld  Date/Time Value Ref Range Status  06/19/2020 08:35 AM 7.4 (H) 4.8 - 5.6 % Final    Comment:    (NOTE) Pre diabetes:          5.7%-6.4%  Diabetes:              >6.4%  Glycemic control for   <7.0% adults with diabetes   05/22/2018 10:58 PM 5.9 (H) 4.8 - 5.6 % Final    Comment:    (  NOTE) Pre diabetes:          5.7%-6.4% Diabetes:              >6.4% Glycemic control for   <7.0% adults with diabetes     CBG: Recent Labs  Lab 06/22/20 1337  06/22/20 1515 06/22/20 1930 06/22/20 2306 06/23/20 0319  GLUCAP 97 95 98 101* 102*    Total critical care time: 35 minutes  Asencion Noble, M.D. [facility] 06/23/2020 7:31 AM

## 2020-06-23 NOTE — Progress Notes (Signed)
[facility] Progress Note  Subjective: very minimal UOP-  Off and on pressors - BP is soft-  Remains on CRRT.  Fluid balance positive 2100  Vitals:   06/23/20 0825 06/23/20 0830 06/23/20 0845 06/23/20 0900  BP: (!) 85/61 (!) 70/48 (!) 67/50 108/83  Pulse: (!) 42 (!) 37 76 66  Resp: (!) 25 (!) 25 (!) 25 (!) 25  Temp: 98.96 F (37.2 C) 98.96 F (37.2 C) 98.96 F (37.2 C) 98.78 F (37.1 C)  TempSrc:      SpO2: 100% 100% 100% 100%  Weight:        Exam: Gen pt on vent, sedated-  EEG Sclera anicteric, throat w/ ETT  No jvd or bruits Chest clear bilat to bases RRR no MRG, tachy Abd soft ntnd no mass or ascites +bs GU normal male Ext trace LE edema Neuro is sedated on the vent- temp HD cath in for 2 days    Per Care Everywhere creat was          In Feb 2020 = 2.43, eGFR 38          In Oct 2021 = 3.22, eGFR 27     Date                           Creat               eGFR   Jan 2020                   2.5 >> 2.0        36- 49 stage IIIb   Feb 2020                   2.43                 38   Oct 2021                   3.22                 27 stage IV   Jun 18, 2020             16.4                    Jan 29                       14.8                 Stage V      UA pend    UNa, UCr pend     CXR 1/29 - IMPRESSION: Low lung volumes with patchy opacities, potentially atelectasis or alternatively developing infection.   Renal US 1/26 - IMPRESSION: Echogenic kidneys bilaterally consistent with medical renal disease. 12.7/ 13.3 cm length. No hydronephrosis.    Na 140  K 4.2  CO2 14  BUN 98  Cr 14.9  Ca 7.8  Alb 2.7  LFT"s ok  Tbili 2     WBC 29k  Hb 12  Hb A1C  7.4  Summary: 35 y.o. black male with diabetes mellitus type I, diabetic retinopathy, hypertension, systolic congestive heart failure who was admitted to [facility] on 06/18/2020 for acute renal failure. He had several days of gen weakness, myalgias, nausea/ vomiting w/ poor po intake per the 1/26 H&P. Continued to  take his medications which included diuretics and  BP medications. Typically has leg edema but on admission did not. BP's in ED were low 67/ 50. Covid neg.      Assessment/ Plan: 1. AKI on CKD 4 - B/l creat 3.2 in Oct 2021, eGFR 27 ml/min.  Underlying CKD f/b CCKA, felt to be due to T1DM mostly.  Presented to ED 1/26 w/ BP's in 60's, pt on diuretcs/ BP lowering meds including ARB at home, had n/v several days per H&P. Suspect vol depletion/ ARB/ hypotension/ hypoperfusion main cause of AKI.  Renal US shows no evidence of obstruction. Pt had HD x 1 at [facility], then was transferred to [facility] for neuro support. Intubated here for airway protection. Started CRRT here on 1/29. Keeping +75 cc/hr due to vol depletion. Cont CRRT- all 4 K baths. Renal function quite impaired at baseline-  Could remain RRT dep  2. Hypotension - cardiac or hypovolemic shock 3. VDRF - sedated and intubated due to AMS 4. Seizures - likely uremia +/- other causes.  Seen by neurology- EEG in progress.  5. DMT1 - longstanding hx 6. HFrEF - EV 20- 25% 7.  Anemia-  Has not required treatment     Louis Meckel  06/23/2020, 9:30 AM   Recent Labs  Lab 06/22/20 0513 06/22/20 1554 06/23/20 0337  K 3.9 3.2* 3.9  BUN 76* 58* 41*  CREATININE 10.97* 8.61* 6.05*  CALCIUM 8.0* 7.7* 7.7*  PHOS 4.3 2.8 2.1*  HGB 11.8*  --  10.7*   Inpatient medications: . chlorhexidine gluconate (MEDLINE KIT)  15 mL Mouth Rinse BID  . Chlorhexidine Gluconate Cloth  6 each Topical Daily  . insulin aspart  0-9 Units Subcutaneous Q4H  . lidocaine (PF)  5 mL Intradermal Once  . mouth rinse  15 mL Mouth Rinse 10 times per day  . vancomycin variable dose per unstable renal function (pharmacist dosing)   Does not apply See admin instructions   .  prismasol BGK 4/2.5 400 mL/hr at 06/23/20 0711  .  prismasol BGK 4/2.5 200 mL/hr at 06/22/20 1859  . sodium chloride 75 mL/hr at 06/23/20 0900  . sodium chloride    . acyclovir    . ampicillin  (OMNIPEN) IV Stopped (06/23/20 0558)  . cefTRIAXone (ROCEPHIN)  IV Stopped (06/23/20 0057)  . famotidine (PEPCID) IV Stopped (06/22/20 2241)  . fentaNYL infusion INTRAVENOUS 100 mcg/hr (06/23/20 0900)  . heparin 10,000 units/ 20 mL infusion syringe 500 Units/hr (06/23/20 0711)  . levETIRAcetam Stopped (06/23/20 0021)  . norepinephrine (LEVOPHED) Adult infusion 2 mcg/min (06/23/20 0900)  . prismasol BGK 4/2.5 1,800 mL/hr at 06/23/20 0650  . propofol (DIPRIVAN) infusion 30 mcg/kg/min (06/23/20 0900)  . vancomycin Stopped (06/22/20 2357)   alteplase, docusate sodium, fentaNYL, fentaNYL (SUBLIMAZE) injection, heparin, heparin, ondansetron (ZOFRAN) IV, polyethylene glycol, sodium chloride

## 2020-06-23 NOTE — Progress Notes (Signed)
Patient began biting on ETT. Oxygen saturation declined to 60's. O2 breath given. Propofol gtt rate increased. 50 mcg bolus of fentanyl given. Bite block placed.   0810 MD at  bedside and notified. Patient now hypotensive. Will titrate sedation back down. PFR on CRRT put to zero until BP recovers.  0845 restarted levo gtt

## 2020-06-23 NOTE — TOC Initial Note (Signed)
Transition of Care [facility]) - Initial/Assessment Note    Patient Details  Name: Adrian Evans MRN: [id number] Date of Birth: [birth year]  Transition of Care [facility]) CM/SW Contact:    Benard Halsted, LCSW Phone Number: 06/23/2020, 11:52 AM  Clinical Narrative:                 Patient presents with high hospital readmission risk score. Patient resides at home with his spouse and reported being compliant with medications prior to this hospitalization. CSW will continue to follow for discharge needs.   Expected Discharge Plan: Home/Self Care Barriers to Discharge: Continued Medical Work up   Patient Goals and CMS Choice        Expected Discharge Plan and Services Expected Discharge Plan: Home/Self Care                                              Prior Living Arrangements/Services   Lives with:: Spouse Patient language and need for interpreter reviewed:: Yes        Need for Family Participation in Patient Care: Yes (Comment) Care giver support system in place?: Yes (comment)   Criminal Activity/Legal Involvement Pertinent to Current Situation/Hospitalization: No - Comment as needed  Activities of Daily Living Home Assistive Devices/Equipment: None ADL Screening (condition at time of admission) Patient's cognitive ability adequate to safely complete daily activities?: No Is the patient deaf or have difficulty hearing?: No Does the patient have difficulty seeing, even when wearing glasses/contacts?: No Does the patient have difficulty concentrating, remembering, or making decisions?: Yes Patient able to express need for assistance with ADLs?: No Does the patient have difficulty dressing or bathing?: Yes Independently performs ADLs?: No Communication: Independent Dressing (OT): Dependent Is this a change from baseline?: Change from baseline, expected to last <3days Grooming: Dependent Is this a change from baseline?: Change from baseline, expected to last <3  days Feeding: Dependent Is this a change from baseline?: Change from baseline, expected to last <3 days Bathing: Dependent Is this a change from baseline?: Change from baseline, expected to last <3 days Toileting: Dependent Is this a change from baseline?: Change from baseline, expected to last <3 days In/Out Bed: Dependent Is this a change from baseline?: Change from baseline, expected to last <3 days Does the patient have difficulty walking or climbing stairs?: Yes Weakness of Legs: Both Weakness of Arms/Hands: Both  Permission Sought/Granted                  Emotional Assessment       Orientation: :  (Unable to assess; trach) Alcohol / Substance Use: Not Applicable Psych Involvement: No (comment)  Admission diagnosis:  Acute encephalopathy [G93.40] Patient Active Problem List   Diagnosis Date Noted  . Acute encephalopathy 06/21/2020  . Acute renal failure (ARF) ([place]) 06/18/2020  . Acute CHF (congestive heart failure) ([place]) 05/22/2018  . Iron deficiency anemia 04/16/2018   PCP:  Lenard Simmer, MD Pharmacy:   [facility] [address], [place] - [place] [address] [place] [address] Phone: [phone] Fax: [phone]     Social Determinants of Health (SDOH) Interventions    Readmission Risk Interventions Readmission Risk Prevention Plan 06/23/2020  Transportation Screening Complete  PCP or Specialist Appt within 3-5 Days Complete  HRI or [place] Complete  Social Work Consult for [place] Planning/Counseling Complete  Palliative Care Screening Not Applicable  Medication Review (RN Care Manager) Referral to Pharmacy  Some recent data might be hidden

## 2020-06-24 DIAGNOSIS — I639 Cerebral infarction, unspecified: Secondary | ICD-10-CM

## 2020-06-24 LAB — ANA W/REFLEX IF POSITIVE: Anti Nuclear Antibody (ANA): NEGATIVE

## 2020-06-24 LAB — CBC WITH DIFFERENTIAL/PLATELET
Abs Immature Granulocytes: 0 10*3/uL (ref 0.00–0.07)
Basophils Absolute: 0 10*3/uL (ref 0.0–0.1)
Basophils Relative: 0 %
Eosinophils Absolute: 0.3 10*3/uL (ref 0.0–0.5)
Eosinophils Relative: 3 %
HCT: 29 % — ABNORMAL LOW (ref 39.0–52.0)
Hemoglobin: 10.1 g/dL — ABNORMAL LOW (ref 13.0–17.0)
Lymphocytes Relative: 4 %
Lymphs Abs: 0.4 10*3/uL — ABNORMAL LOW (ref 0.7–4.0)
MCH: 31.3 pg (ref 26.0–34.0)
MCHC: 34.8 g/dL (ref 30.0–36.0)
MCV: 89.8 fL (ref 80.0–100.0)
Monocytes Absolute: 0.4 10*3/uL (ref 0.1–1.0)
Monocytes Relative: 4 %
Neutro Abs: 9.2 10*3/uL — ABNORMAL HIGH (ref 1.7–7.7)
Neutrophils Relative %: 89 %
Platelets: 325 10*3/uL (ref 150–400)
RBC: 3.23 MIL/uL — ABNORMAL LOW (ref 4.22–5.81)
RDW: 13.7 % (ref 11.5–15.5)
WBC: 10.3 10*3/uL (ref 4.0–10.5)
nRBC: 0 /100 WBC
nRBC: 0.4 % — ABNORMAL HIGH (ref 0.0–0.2)

## 2020-06-24 LAB — GLUCOSE, CAPILLARY
Glucose-Capillary: 184 mg/dL — ABNORMAL HIGH (ref 70–99)
Glucose-Capillary: 168 mg/dL — ABNORMAL HIGH (ref 70–99)
Glucose-Capillary: 152 mg/dL — ABNORMAL HIGH (ref 70–99)
Glucose-Capillary: 150 mg/dL — ABNORMAL HIGH (ref 70–99)
Glucose-Capillary: 135 mg/dL — ABNORMAL HIGH (ref 70–99)
Glucose-Capillary: 115 mg/dL — ABNORMAL HIGH (ref 70–99)

## 2020-06-24 LAB — RENAL FUNCTION PANEL
Albumin: 2 g/dL — ABNORMAL LOW (ref 3.5–5.0)
Anion gap: 8 (ref 5–15)
BUN: 28 mg/dL — ABNORMAL HIGH (ref 6–20)
CO2: 23 mmol/L (ref 22–32)
Calcium: 7.7 mg/dL — ABNORMAL LOW (ref 8.9–10.3)
Chloride: 104 mmol/L (ref 98–111)
Creatinine, Ser: 3.44 mg/dL — ABNORMAL HIGH (ref 0.61–1.24)
GFR, Estimated: 23 mL/min — ABNORMAL LOW (ref >60)
Glucose, Bld: 165 mg/dL — ABNORMAL HIGH (ref 70–99)
Phosphorus: 2.5 mg/dL (ref 2.5–4.6)
Potassium: 3.8 mmol/L (ref 3.5–5.1)
Sodium: 135 mmol/L (ref 135–145)

## 2020-06-24 LAB — MAGNESIUM: Magnesium: 2.4 mg/dL (ref 1.7–2.4)

## 2020-06-24 LAB — APTT: aPTT: 36 seconds (ref 24–36)

## 2020-06-24 LAB — VDRL, CSF: VDRL Quant, CSF: NONREACTIVE

## 2020-06-24 LAB — SJOGRENS SYNDROME-A EXTRACTABLE NUCLEAR ANTIBODY: SSA (Ro) (ENA) Antibody, IgG: <0.2 AI (ref 0.0–0.9)

## 2020-06-24 LAB — SJOGRENS SYNDROME-B EXTRACTABLE NUCLEAR ANTIBODY: SSB (La) (ENA) Antibody, IgG: <0.2 AI (ref 0.0–0.9)

## 2020-06-24 LAB — VARICELLA ZOSTER ANTIBODY, IGG: Varicella IgG: 2003 index (ref >165)

## 2020-06-24 SURGERY — LAPAROSCOPIC INSERTION CONTINUOUS AMBULATORY PERITONEAL DIALYSIS  (CAPD) CATHETER
Anesthesia: Choice

## 2020-06-24 MED ORDER — POTASSIUM PHOSPHATES 15 MMOLE/5ML IV SOLN
20.0000 mmol | Freq: Once | INTRAVENOUS | Status: AC
  Administered 2020-06-24: 20 mmol via INTRAVENOUS
  Filled 2020-06-24: qty 6.67

## 2020-06-24 MED ORDER — ORAL CARE MOUTH RINSE
15.0000 mL | Freq: Two times a day (BID) | OROMUCOSAL | Status: DC
  Administered 2020-06-24 – 2020-07-03 (×14): 15 mL via OROMUCOSAL

## 2020-06-24 MED ORDER — FAMOTIDINE 40 MG/5ML PO SUSR
20.0000 mg | Freq: Every day | ORAL | Status: DC
  Administered 2020-06-25 – 2020-06-28 (×4): 20 mg
  Filled 2020-06-24 (×4): qty 2.5

## 2020-06-24 MED ORDER — CHLORHEXIDINE GLUCONATE 0.12 % MT SOLN
15.0000 mL | Freq: Two times a day (BID) | OROMUCOSAL | Status: DC
  Administered 2020-06-24 – 2020-07-04 (×20): 15 mL via OROMUCOSAL
  Filled 2020-06-24 (×18): qty 15

## 2020-06-24 MED ORDER — HEPARIN SODIUM (PORCINE) 5000 UNIT/ML IJ SOLN
5000.0000 [IU] | Freq: Three times a day (TID) | INTRAMUSCULAR | Status: DC
  Administered 2020-06-24 – 2020-06-29 (×14): 5000 [IU] via SUBCUTANEOUS
  Filled 2020-06-24 (×14): qty 1

## 2020-06-24 NOTE — Progress Notes (Signed)
[facility] Progress Note  Subjective: very minimal UOP-  Off  pressors - BP better  Remains on CRRT.  Fluid balance positive 2100- extubated this AM  Vitals:   06/24/20 0800 06/24/20 0900 06/24/20 1000 06/24/20 1048  BP: 123/84 (!) 143/91 (!) 136/92   Pulse: 80 91 90 98  Resp: (!) 25 12 14 16   Temp: (!) 97.16 F (36.2 C) (!) 97.34 F (36.3 C) (!) 96.8 F (36 C)   TempSrc: Rectal     SpO2: 100% 100% 100% 100%  Weight:        Exam: Gen pt  sedated- just extubated  Sclera anicteric,  No jvd or bruits Chest clear bilat to bases RRR no MRG, tachy Abd soft ntnd no mass or ascites +bs GU normal male Ext trace LE edema Neuro is sedated on the vent- temp HD cath in for 3 days    Per Care Everywhere creat was          In Feb 2020 = 2.43, eGFR 38          In Oct 2021 = 3.22, eGFR 27     Date                           Creat               eGFR   Jan 2020                   2.5 >> 2.0        36- 49 stage IIIb   Feb 2020                   2.43                 38   Oct 2021                   3.22                 27 stage IV   Jun 18, 2020             16.4                    Jan 29                       14.8                 Stage V      UA pend    UNa, UCr pend     CXR 1/29 - IMPRESSION: Low lung volumes with patchy opacities, potentially atelectasis or alternatively developing infection.   Renal US 1/26 - IMPRESSION: Echogenic kidneys bilaterally consistent with medical renal disease. 12.7/ 13.3 cm length. No hydronephrosis.    Na 140  K 4.2  CO2 14  BUN 98  Cr 14.9  Ca 7.8  Alb 2.7  LFT"s ok  Tbili 2     WBC 29k  Hb 12  Hb A1C  7.4  Summary: 35 y.o. black male with diabetes mellitus type I, diabetic retinopathy, hypertension, systolic congestive heart failure who was admitted to [facility] on 06/18/2020 for acute renal failure. He had several days of gen weakness, myalgias, nausea/ vomiting w/ poor po intake per the 1/26 H&P. Continued to take his medications which  included diuretics and BP medications. Typically has leg edema but on  admission did not. BP's in ED were low 67/ 50. Covid neg.      Assessment/ Plan: 1. AKI on CKD 4 - B/l creat 3.2 in Oct 2021, eGFR 27 ml/min.  Underlying CKD f/b CCKA, felt to be due to T1DM mostly.  Presented to ED 1/26 w/ BP's in 60's, pt on diuretcs/ BP lowering meds including ARB at home, had n/v several days per H&P. Suspect vol depletion/ ARB/ hypotension/ hypoperfusion main cause of AKI.  Renal US shows no evidence of obstruction. Pt had HD x 1 at [facility], then was transferred to [facility] for neuro support. Intubated here for airway protection. Started CRRT here on 1/29- present. Keeping +75 cc/hr due to vol depletion- total of 5 liters positive.  Renal function quite impaired at baseline-  Could remain RRT dep.  No labs this AM- I think will be able to transition to IHD-  Stop CRRT  And follow  2. Hypotension - cardiac or hypovolemic shock- better-  I think his tank is full  3. VDRF - sedated and intubated due to AMS 4. Seizures - likely uremia +/- other causes.  Seen by neurology- EEG in progress, c/w encephalopathy-  EEG did not show sz. MRI abnormal but wife happily reports he is following some commands  5. DMT1 - longstanding hx 6. HFrEF - EV 20- 25% 7.  Anemia-  Has not required treatment     Louis Meckel  06/24/2020, 10:55 AM   Recent Labs  Lab 06/22/20 0513 06/22/20 1554 06/23/20 0337 06/23/20 1549  K 3.9   < > 3.9 3.8  BUN 76*   < > 41* 31*  CREATININE 10.97*   < > 6.05* 4.95*  CALCIUM 8.0*   < > 7.7* 7.7*  PHOS 4.3   < > 2.1* 2.1*  HGB 11.8*  --  10.7*  --    < > = values in this interval not displayed.   Inpatient medications: . chlorhexidine gluconate (MEDLINE KIT)  15 mL Mouth Rinse BID  . Chlorhexidine Gluconate Cloth  6 each Topical Daily  . famotidine  20 mg Per Tube QHS  . feeding supplement (PROSource TF)  90 mL Per Tube QID  . heparin injection (subcutaneous)  5,000 Units  Subcutaneous Q8H  . insulin aspart  0-9 Units Subcutaneous Q4H  . lidocaine (PF)  5 mL Intradermal Once  . mouth rinse  15 mL Mouth Rinse 10 times per day   .  prismasol BGK 4/2.5 400 mL/hr at 06/24/20 1037  .  prismasol BGK 4/2.5 200 mL/hr at 06/24/20 9211  . sodium chloride    . sodium chloride    . acyclovir Stopped (06/24/20 0522)  . ampicillin (OMNIPEN) IV Stopped (06/24/20 0606)  . cefTRIAXone (ROCEPHIN)  IV Stopped (06/24/20 0048)  . feeding supplement (PIVOT 1.5 CAL) Stopped (06/24/20 0900)  . fentaNYL infusion INTRAVENOUS Stopped (06/24/20 0820)  . heparin 10,000 units/ 20 mL infusion syringe 500 Units/hr (06/24/20 0117)  . levETIRAcetam Stopped (06/23/20 2343)  . norepinephrine (LEVOPHED) Adult infusion Stopped (06/23/20 1102)  . potassium PHOSPHATE IVPB (in mmol)    . prismasol BGK 4/2.5 1,800 mL/hr at 06/24/20 1053  . propofol (DIPRIVAN) infusion Stopped (06/24/20 0844)  . vancomycin Stopped (06/23/20 2308)   alteplase, docusate sodium, fentaNYL, fentaNYL (SUBLIMAZE) injection, heparin, heparin, ondansetron (ZOFRAN) IV, polyethylene glycol, sodium chloride

## 2020-06-24 NOTE — Procedures (Signed)
Extubation Procedure Note  Patient Details:   Name: Adrian Evans DOB: [birth year] MRN: [id number]   Airway Documentation:    Vent end date: 06/24/20 Vent end time: 1049   Evaluation  O2 sats: stable throughout Complications: No apparent complications Patient did tolerate procedure well. Bilateral Breath Sounds: Clear   Yes   Patient extubated per MD order. Positive cuff leak. No stridor noted. Vitals are stable on 3L [place]. RN at bedside.  [place] 06/24/2020, 10:49 AM

## 2020-06-24 NOTE — Procedures (Signed)
Patient Name:Johncarlos JARRELL SFERRAZZA [id number] Epilepsy Attending:Jahfari Ambers Barbra Sarks Referring Physician/Provider:Dr Lesleigh Noe Duration:06/23/2020 2120 to 2/1/20221228  Patient [id number] M with ams. EEG to evaluate for seizure  Level of alertness:Awake, asleep  AEDs during EEG study:LEV, Propofol  Technical aspects: This EEG study was done with scalp electrodes positioned according to the 10-20 International system of electrode placement. Electrical activity was acquired at a sampling rate of '500Hz'$  and reviewed with a high frequency filter of '70Hz'$  and a low frequency filter of '1Hz'$ . EEG data were recorded continuously and digitally stored.   Description:Noposterior dominant rhythmwas seen.Sleep was characterized by vertex waves, sleep spindles (12 to 14 Hz), maximal frontocentral region. EEG showed continuous generalized 3 to 6 Hz theta-delta slowing. Hyperventilation and photic stimulation were not performed.   ABNORMALITY -Continuousslow, generalized  IMPRESSION: This study is suggestive of moderate diffuse encephalopathy, nonspecific.No seizures or epileptiform discharges were seen throughout the recording.  Arianny Pun Barbra Sarks

## 2020-06-24 NOTE — Progress Notes (Signed)
LTM EEG discontinued - no skin breakdown at unhook.   

## 2020-06-24 NOTE — Progress Notes (Signed)
NAME:  Adrian Evans, MRN:  [id number], DOB:  1985-08-11, LOS: 3 ADMISSION DATE:  06/21/2020, CONSULTATION DATE:  1/29 REFERRING MD:  [facility] CCM, CHIEF COMPLAINT:  AMS  Brief History:  35 yo M admitted to [facility] 1/26 w n/v malaise, weakness. Hypotensive in ED and volume resuscitated. Worse AMS without clear etiology, transferring to [facility] 1/29 for LP and cEEG    Past Medical History:  CKD III DM I Diabetic retinopathy HFrEF  Significant Hospital Events:  1/26 admitted, in shock felt to be hypovolemic  1/27 cards consult, nephrology consult. Surgery and vascular consults for HD access 1/28 hypotensive T to ICU. AMS, sz during tele neuro eval. Rec HD 1/29 Keppra, meningitis coverage, [facility] for c[facility]G LP 1/30 LP performed by IR, MRI showed multiple cerebral infarcts  Consults:  Nephro Vascular (for permcath) GenSurg (for eval for PD) Cards PCCM Neuro   Procedures:  1/30 LP 1/28 CVC>  Significant Diagnostic Tests:   1/26 renal US: no hydronephrosis. 2.1 solid mass in R hepatic lobe ? hemangioma  ECHO 1/27> LVEF 40- 45%, LV hypokinesis, Dilated LV, moderate LVH, Interventricular septum is flatted in diastole/D shaped. RV reduces systolic function. Enlarged LV. Small pericardial effusion posterior to LV, circumferrential, no evidence of tamponade   1/28 CT H no acute intracranial abnormality -- ill defined low density focus in L thalamus 1/28 CTA h/n> no LVO   1/29 MRI Brain 1. Multiple scattered punctate acute to early subacute ischemic white matter infarcts involving the periventricular and deep white matter of both cerebral hemispheres, right greater than left. No associated hemorrhage or mass effect. 2. 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   Micro Data:  1/26 COVID - neg  Antimicrobials:  1/29 acyclovir> 1/29 ampicillin> 1/29 rocephin> 1/29 vanc>  Interim History / Subjective:   Sedation was turned off this morning, patient remained on pressure support trial, closely watching for respiratory distress  Objective   Blood pressure (!) 143/91, pulse 91, temperature (!) 97.34 F (36.3 C), resp. rate 12, weight 96.5 kg, SpO2 100 %.    Vent Mode: PSV;CPAP FiO2 (%):  [30 %] 30 % Set Rate:  [25 bmp] 25 bmp Vt Set:  [620 mL] 620 mL PEEP:  [5 cmH20] 5 cmH20 Pressure Support:  [12 cmH20] 12 cmH20 Plateau Pressure:  [19 cmH20-27 cmH20] 19 cmH20   Intake/Output Summary (Last 24 hours) at 06/24/2020 1022 Last data filed at 06/24/2020 0900 Gross per 24 hour  Intake 4705.72 ml  Output 2840 ml  Net 1865.72 ml   Filed Weights   06/21/20 1537 06/22/20 0500 06/23/20 0500  Weight: 94.5 kg 98.8 kg 96.5 kg    Examination: General: Critically ill appearing male, intubated on CRRT HEENT: [place]/AT, eyes anicteric, OG and ETT in place, cEEG leads in place, Right IJ in place  Cardiac: RRR, no m/r/g Pulmonary: Coarse breath sounds, no wheezing, rhonchi, or rales Abdomen: Soft, non-tender, non-distended, normoactive bowl sounds Extremity: No LE swelling, SCDs in place Neuro: Opens eyes with vocal stimuli, following simple commands, generalized weak.  No gaze preference  Resolved Hospital Problem list     Assessment and plan:   Acute encephalopathy, multifactorial 2/2 multiple scattered punctate cerebral infarcts plus metabolic encephalopathy. Also with concern for seizures vs autoimmune process vs meningitis. S/p LP on 1/30 by IR LP results: glucose 63, RBC 67, total protein 108, 0 WBCs. Opening pressure of 15,  Quantiferon and HIV negative. Hepatitis panel  negative. Covid negative.  Strep pneumoniae ag is negative Continue empiric coverage with acyclovir, ampicillin, ceftriaxone and vancomycin  We will discontinue vEEG, no seizure activity noted thus far, consistent with encephalopathy Continue keppra  Neurology follow-up is appreciated Autoimmune work-up is  negative  Shock Concern for sepsis secondary to CNS infection vs hypovolemic shock.  Remains off pressors  BP has remained stable. Twelve-lead EKG showed bigeminy with multiple PVCs TEE was done which ruled out infective endocarditis Continue antibiotics as above Keeping net even on CRRT  Multiple Cerebral Infarcts Unknown etiology at this time.  TEE was negative for thrombus or infective endocarditis  Acute renal failure on CKD 3a Anion gap metabolic acidosis Oliguric Labs improving on CRRT.  Nephrology is following Continue CRRT  Acute hypoxic respiratory failure, inability to protect airway due to altered mental status Patient is tolerating pressure support trial We will watch for respiratory distress VAP bundle Minimize sedation with RASS goal 0/-1  Acute on chronic systolic/diastolic congestive heart failure Patient echocardiogram is suggestive of EF 40 to 45%, dilated LV and global hypokinesis associated with diastolic dysfunction. EKG with new TWIs as noted above Continue Aspirin Holding meds for now  Diabetes type I CBGs around 100s Continue sliding scale insulin Fingerstick goal 140-180  Best practice (evaluated daily)  Diet: NPO, holding tube feeds for possible extubation Pain/Anxiety/Delirium protocol (if indicated):  VAP protocol (if indicated): Ordered DVT prophylaxis: SCD GI prophylaxis: Famotidine Glucose control: SSI Mobility: Bedrest Disposition: ICU  Goals of Care:  Last date of multidisciplinary goals of care discussion: 1/30 Family and staff present: Wife, RN and Dr. Erin Fulling Summary of discussion: Continue aggressive measures Follow up goals of care discussion due: 06/29/20 Code Status: Full code  Labs   CBC: Recent Labs  Lab 06/18/20 1334 06/18/20 1616 06/19/20 0835 06/21/20 0558 06/21/20 1953 06/21/20 2336 06/22/20 0513 06/23/20 0337  WBC 5.3 4.9 6.2 29.8*  --   --  18.6* 10.2  NEUTROABS 4.2  --   --   --   --   --  16.1* 8.4*   HGB 12.1* 12.1* 12.6* 12.8* 11.2* 11.6* 11.8* 10.7*  HCT 35.5* 35.5* 36.7* 35.8* 33.0* 34.0* 33.8* 30.2*  MCV 90.1 90.1 90.8 87.5  --   --  88.0 89.1  PLT 222 211 240 351  --   --  308 [id number]    Basic Metabolic Panel: Recent Labs  Lab 06/20/20 2036 06/21/20 0558 06/21/20 1746 06/21/20 1953 06/21/20 2336 06/22/20 0513 06/22/20 1554 06/23/20 0337 06/23/20 1549  NA 137 140 136   < > 139 138 137 138 136  K 3.4* 4.2 3.7   < > 3.9 3.9 3.2* 3.9 3.8  CL 105 109 104  --   --  107 107 108 102  CO2 13* 14* 13*  --   --  14* 16* 16* 20*  GLUCOSE 95 107* 147*  --   --  111* 99 105* 124*  BUN 92* 98* 103*  --   --  76* 58* 41* 31*  CREATININE 13.83* 14.94* 15.51*  --   --  10.97* 8.61* 6.05* 4.95*  CALCIUM 8.0* 7.8* 7.7*  --   --  8.0* 7.7* 7.7* 7.7*  MG 1.8 2.4 2.2  --   --  2.1  --  2.2  --   PHOS 6.3* 7.3* 7.4*  --   --  4.3 2.8 2.1* 2.1*   < > = values in this interval not displayed.   GFR: Estimated Creatinine Clearance: 24.6  mL/min (A) (by C-G formula based on SCr of 4.95 mg/dL (H)). Recent Labs  Lab 06/18/20 1339 06/18/20 1508 06/18/20 1616 06/19/20 0835 06/20/20 2036 06/21/20 0558 06/21/20 1746 06/22/20 0513 06/23/20 0337  PROCALCITON  --   --   --   --  2.05 4.27 5.59  --   --   WBC  --   --    < > 6.2  --  29.8*  --  18.6* 10.2  LATICACIDVEN 0.9 0.6  --   --  1.1  --   --   --   --    < > = values in this interval not displayed.    Liver Function Tests: Recent Labs  Lab 06/18/20 1334 06/20/20 2036 06/21/20 1746 06/22/20 0513 06/22/20 1554 06/23/20 0337 06/23/20 1549  AST 6* 10* 8* 9*  --   --   --   ALT '12 12 12 10  '$ --   --   --   ALKPHOS 78 69 73 75  --   --   --   BILITOT 1.2 2.0* 1.8* 2.0*  --   --   --   PROT 6.2* 6.5 5.9* 6.0*  --   --   --   ALBUMIN 2.6* 2.7* 2.3* 2.0*  2.0* 1.9* 1.8* 1.8*   Recent Labs  Lab 06/21/20 1746  LIPASE 22  AMYLASE 46   Recent Labs  Lab 06/20/20 2036  AMMONIA 25    ABG    Component Value Date/Time   PHART  7.276 (L) 06/21/2020 2336   PCO2ART 36.3 06/21/2020 2336   PO2ART 124 (H) 06/21/2020 2336   HCO3 17.4 (L) 06/21/2020 2336   TCO2 19 (L) 06/21/2020 2336   ACIDBASEDEF 9.0 (H) 06/21/2020 2336   O2SAT 99.0 06/21/2020 2336     Coagulation Profile: Recent Labs  Lab 06/18/20 1334  INR 1.2    Cardiac Enzymes: Recent Labs  Lab 06/18/20 1508 06/22/20 0513  CKTOTAL 397 198    HbA1C: Hgb A1c MFr Bld  Date/Time Value Ref Range Status  06/19/2020 08:35 AM 7.4 (H) 4.8 - 5.6 % Final    Comment:    (NOTE) Pre diabetes:          5.7%-6.4%  Diabetes:              >6.4%  Glycemic control for   <7.0% adults with diabetes   05/22/2018 10:58 PM 5.9 (H) 4.8 - 5.6 % Final    Comment:    (NOTE) Pre diabetes:          5.7%-6.4% Diabetes:              >6.4% Glycemic control for   <7.0% adults with diabetes     CBG: Recent Labs  Lab 06/23/20 1511 06/23/20 1938 06/23/20 2315 06/24/20 0318 06/24/20 0730  GLUCAP 115* 200* 211* 184* 168*    Total critical care time: 41 minutes  Performed by: Jacky Kindle   Critical care time was exclusive of separately billable procedures and treating other patients.   Critical care was necessary to treat or prevent imminent or life-threatening deterioration.   Critical care was time spent personally by me on the following activities: development of treatment plan with patient and/or surrogate as well as nursing, discussions with consultants, evaluation of patient's response to treatment, examination of patient, obtaining history from patient or surrogate, ordering and performing treatments and interventions, ordering and review of laboratory studies, ordering and review of radiographic studies, pulse oximetry and re-evaluation  of patient's condition.   Jacky Kindle MD [place] [facility] See Amion for pager If no response to pager, please call [phone] until 7pm After 7pm, Please call E-link [phone]

## 2020-06-25 DIAGNOSIS — E1059 Type 1 diabetes mellitus with other circulatory complications: Secondary | ICD-10-CM | POA: Diagnosis not present

## 2020-06-25 DIAGNOSIS — I10 Essential (primary) hypertension: Secondary | ICD-10-CM

## 2020-06-25 DIAGNOSIS — N183 Chronic kidney disease, stage 3 unspecified: Secondary | ICD-10-CM

## 2020-06-25 DIAGNOSIS — I69391 Dysphagia following cerebral infarction: Secondary | ICD-10-CM

## 2020-06-25 DIAGNOSIS — I639 Cerebral infarction, unspecified: Secondary | ICD-10-CM | POA: Diagnosis not present

## 2020-06-25 DIAGNOSIS — I5022 Chronic systolic (congestive) heart failure: Secondary | ICD-10-CM

## 2020-06-25 DIAGNOSIS — G9341 Metabolic encephalopathy: Secondary | ICD-10-CM

## 2020-06-25 DIAGNOSIS — R4182 Altered mental status, unspecified: Secondary | ICD-10-CM

## 2020-06-25 DIAGNOSIS — J9602 Acute respiratory failure with hypercapnia: Secondary | ICD-10-CM

## 2020-06-25 DIAGNOSIS — E109 Type 1 diabetes mellitus without complications: Secondary | ICD-10-CM

## 2020-06-25 LAB — CBC WITH DIFFERENTIAL/PLATELET
Abs Immature Granulocytes: 0.7 10*3/uL — ABNORMAL HIGH (ref 0.00–0.07)
Basophils Absolute: 0.1 10*3/uL (ref 0.0–0.1)
Basophils Relative: 1 %
Eosinophils Absolute: 0.2 10*3/uL (ref 0.0–0.5)
Eosinophils Relative: 2 %
HCT: 27.1 % — ABNORMAL LOW (ref 39.0–52.0)
Hemoglobin: 9.2 g/dL — ABNORMAL LOW (ref 13.0–17.0)
Immature Granulocytes: 6 %
Lymphocytes Relative: 9 %
Lymphs Abs: 1 10*3/uL (ref 0.7–4.0)
MCH: 31.5 pg (ref 26.0–34.0)
MCHC: 33.9 g/dL (ref 30.0–36.0)
MCV: 92.8 fL (ref 80.0–100.0)
Monocytes Absolute: 1.1 10*3/uL — ABNORMAL HIGH (ref 0.1–1.0)
Monocytes Relative: 10 %
Neutro Abs: 7.9 10*3/uL — ABNORMAL HIGH (ref 1.7–7.7)
Neutrophils Relative %: 72 %
Platelets: 317 10*3/uL (ref 150–400)
RBC: 2.92 MIL/uL — ABNORMAL LOW (ref 4.22–5.81)
RDW: 13.8 % (ref 11.5–15.5)
WBC: 11 10*3/uL — ABNORMAL HIGH (ref 4.0–10.5)
nRBC: 0.3 % — ABNORMAL HIGH (ref 0.0–0.2)

## 2020-06-25 LAB — RENAL FUNCTION PANEL
Albumin: 1.7 g/dL — ABNORMAL LOW (ref 3.5–5.0)
Albumin: 1.7 g/dL — ABNORMAL LOW (ref 3.5–5.0)
Anion gap: 12 (ref 5–15)
Anion gap: 13 (ref 5–15)
BUN: 33 mg/dL — ABNORMAL HIGH (ref 6–20)
BUN: 39 mg/dL — ABNORMAL HIGH (ref 6–20)
CO2: 22 mmol/L (ref 22–32)
CO2: 22 mmol/L (ref 22–32)
Calcium: 8.1 mg/dL — ABNORMAL LOW (ref 8.9–10.3)
Calcium: 8.4 mg/dL — ABNORMAL LOW (ref 8.9–10.3)
Chloride: 101 mmol/L (ref 98–111)
Chloride: 102 mmol/L (ref 98–111)
Creatinine, Ser: 4.76 mg/dL — ABNORMAL HIGH (ref 0.61–1.24)
Creatinine, Ser: 5.6 mg/dL — ABNORMAL HIGH (ref 0.61–1.24)
GFR, Estimated: 15 mL/min — ABNORMAL LOW (ref >60)
GFR, Estimated: 13 mL/min — ABNORMAL LOW (ref >60)
Glucose, Bld: 133 mg/dL — ABNORMAL HIGH (ref 70–99)
Glucose, Bld: 190 mg/dL — ABNORMAL HIGH (ref 70–99)
Phosphorus: 4.8 mg/dL — ABNORMAL HIGH (ref 2.5–4.6)
Phosphorus: 5.3 mg/dL — ABNORMAL HIGH (ref 2.5–4.6)
Potassium: 3.8 mmol/L (ref 3.5–5.1)
Potassium: 3.9 mmol/L (ref 3.5–5.1)
Sodium: 135 mmol/L (ref 135–145)
Sodium: 137 mmol/L (ref 135–145)

## 2020-06-25 LAB — CBC
HCT: 26.6 % — ABNORMAL LOW (ref 39.0–52.0)
Hemoglobin: 9.1 g/dL — ABNORMAL LOW (ref 13.0–17.0)
MCH: 31.4 pg (ref 26.0–34.0)
MCHC: 34.2 g/dL (ref 30.0–36.0)
MCV: 91.7 fL (ref 80.0–100.0)
Platelets: 312 10*3/uL (ref 150–400)
RBC: 2.9 MIL/uL — ABNORMAL LOW (ref 4.22–5.81)
RDW: 13.8 % (ref 11.5–15.5)
WBC: 10.5 10*3/uL (ref 4.0–10.5)
nRBC: 0.3 % — ABNORMAL HIGH (ref 0.0–0.2)

## 2020-06-25 LAB — BETA-2-GLYCOPROTEIN I ABS, IGG/M/A
Beta-2 Glyco I IgG: <9 GPI IgG units (ref 0–20)
Beta-2-Glycoprotein I IgA: <9 GPI IgA units (ref 0–25)
Beta-2-Glycoprotein I IgM: 9 GPI IgM units (ref 0–32)

## 2020-06-25 LAB — GLUCOSE, CAPILLARY
Glucose-Capillary: 119 mg/dL — ABNORMAL HIGH (ref 70–99)
Glucose-Capillary: 127 mg/dL — ABNORMAL HIGH (ref 70–99)
Glucose-Capillary: 132 mg/dL — ABNORMAL HIGH (ref 70–99)
Glucose-Capillary: 169 mg/dL — ABNORMAL HIGH (ref 70–99)
Glucose-Capillary: 174 mg/dL — ABNORMAL HIGH (ref 70–99)
Glucose-Capillary: 184 mg/dL — ABNORMAL HIGH (ref 70–99)

## 2020-06-25 LAB — TRIGLYCERIDES: Triglycerides: 98 mg/dL (ref <150)

## 2020-06-25 LAB — CSF CULTURE W GRAM STAIN: Culture: NO GROWTH

## 2020-06-25 LAB — CARDIOLIPIN ANTIBODIES, IGG, IGM, IGA
Anticardiolipin IgA: <9 APL U/mL (ref 0–11)
Anticardiolipin IgG: <9 GPL U/mL (ref 0–14)
Anticardiolipin IgM: <9 MPL U/mL (ref 0–12)

## 2020-06-25 LAB — MAGNESIUM: Magnesium: 2.4 mg/dL (ref 1.7–2.4)

## 2020-06-25 MED ORDER — LIDOCAINE HCL (PF) 1 % IJ SOLN: 5.0000 mL | INTRAMUSCULAR | Status: DC | PRN

## 2020-06-25 MED ORDER — SODIUM CHLORIDE 0.9 % IV SOLN: 100.0000 mL | INTRAVENOUS | Status: DC | PRN

## 2020-06-25 MED ORDER — DEXTROSE 5 % IV SOLN
5.0000 mg/kg | INTRAVENOUS | Status: AC
  Administered 2020-06-26 – 2020-06-30 (×5): 390 mg via INTRAVENOUS
  Filled 2020-06-25 (×5): qty 7.8

## 2020-06-25 MED ORDER — CARVEDILOL 12.5 MG PO TABS
12.5000 mg | ORAL_TABLET | Freq: Two times a day (BID) | ORAL | Status: DC
  Filled 2020-06-25: qty 1

## 2020-06-25 MED ORDER — HEPARIN SODIUM (PORCINE) 1000 UNIT/ML DIALYSIS
1000.0000 [IU] | INTRAMUSCULAR | Status: DC | PRN
  Filled 2020-06-25: qty 1

## 2020-06-25 MED ORDER — VANCOMYCIN VARIABLE DOSE PER UNSTABLE RENAL FUNCTION (PHARMACIST DOSING): Status: DC

## 2020-06-25 MED ORDER — LIDOCAINE-PRILOCAINE 2.5-2.5 % EX CREA
1.0000 "application " | TOPICAL_CREAM | CUTANEOUS | Status: DC | PRN
  Filled 2020-06-25: qty 5

## 2020-06-25 MED ORDER — CARVEDILOL 12.5 MG PO TABS
12.5000 mg | ORAL_TABLET | Freq: Two times a day (BID) | ORAL | Status: DC
  Administered 2020-06-25: 12.5 mg

## 2020-06-25 MED ORDER — PIVOT 1.5 CAL PO LIQD
1000.0000 mL | ORAL | Status: DC
  Administered 2020-06-25 – 2020-06-26 (×2): 1000 mL
  Filled 2020-06-25 (×3): qty 1000

## 2020-06-25 MED ORDER — ALTEPLASE 2 MG IJ SOLR: 2.0000 mg | Freq: Once | INTRAMUSCULAR | Status: DC | PRN

## 2020-06-25 MED ORDER — SODIUM CHLORIDE 0.9 % IV SOLN
2.0000 g | Freq: Two times a day (BID) | INTRAVENOUS | Status: DC
  Administered 2020-06-25 – 2020-06-26 (×2): 2 g via INTRAVENOUS
  Filled 2020-06-25 (×3): qty 2000
  Filled 2020-06-25: qty 2

## 2020-06-25 MED ORDER — PENTAFLUOROPROP-TETRAFLUOROETH EX AERO: 1.0000 "application " | INHALATION_SPRAY | CUTANEOUS | Status: DC | PRN

## 2020-06-25 MED ORDER — CHLORHEXIDINE GLUCONATE CLOTH 2 % EX PADS
6.0000 | MEDICATED_PAD | Freq: Every day | CUTANEOUS | Status: DC
  Administered 2020-06-26 – 2020-07-03 (×8): 6 via TOPICAL

## 2020-06-25 NOTE — Evaluation (Signed)
Speech Language Pathology Evaluation Patient Details Name: Adrian Evans MRN: [id number] DOB: 08/20/1985 Today's Date: 06/25/2020 Time: [id number] SLP Time Calculation (min) (ACUTE ONLY): 14 min  Problem List:  Patient Active Problem List   Diagnosis Date Noted  . Acute encephalopathy 06/21/2020  . Acute renal failure (ARF) ([place]) 06/18/2020  . Acute CHF (congestive heart failure) ([place]) 05/22/2018  . Iron deficiency anemia 04/16/2018   Past Medical History:  Past Medical History:  Diagnosis Date  . Diabetes mellitus without complication [facility])    Past Surgical History:  Past Surgical History:  Procedure Laterality Date  . EYE SURGERY    . RIGHT/LEFT HEART CATH AND CORONARY ANGIOGRAPHY N/A 05/25/2018   Procedure: RIGHT/LEFT HEART CATH AND CORONARY ANGIOGRAPHY;  Surgeon: Corey Skains, MD;  Location: [facility] CV LAB;  Service: Cardiovascular;  Laterality: N/A;  . TONSILLECTOMY     HPI:  Adrian Evans is a 35 y.o. male admitted with generalized weakness, malaise, nausea vomiting, nasal congestion/sneezing/cough. Found to be in shock, concern for sepis, acute renal failure and acute stroke. Intubated 1/29-2/1 after becoming difficult to arouse. MRI multiple scattered punctate acute to early subacute ischemic white matter infarcts involving the periventricular and deep white matter of both cerebral hemispheres, right greater than left. 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 PMH: type 1 diabetes, EF 20 to 25%, CKD stage IIIb, hypertension.  Chest x-ray showed severe cardiomegaly, pulmonary venous congestion, mild bilateral interstitial prominence (mild CHF versus pneumonitis cannot be excluded).   Assessment / Plan / Recommendation Clinical Impression  Cognitive-speech-language assessment completed with wife at bedside and impacted by pt's behavior. He was mostly cooperative but highly frustrated and easily  overstimulated. Formal testing was challenging. Therapists suspects his expressive language is overall intact and speech intelligibility affected by dysarthria with marked articulatory impresision, resonance differences and prosody. He also exhibits oral apraxia at rest and during commands. Will better assess his speech errors as able to participate for dysathria/weakness versus motor planning/apraxia. Regarding his comprehension, [name] followed one step directions with 50-60% impacted by oral and limb motor planning deficits. Therapist began educating wife and will continue throughout as therapist diagnostically treats language, communication and cognition. He is excellent candidate for inpatient rehab.    SLP Assessment  SLP Recommendation/Assessment: Patient needs continued Speech Lanaguage Pathology Services SLP Visit Diagnosis: Dysarthria and anarthria (R47.1);Apraxia (R48.2);Cognitive communication deficit (R41.841)    Follow Up Recommendations  Inpatient Rehab    Frequency and Duration min 2x/week  2 weeks      SLP Evaluation Cognition  Overall Cognitive Status: Impaired/Different from baseline Arousal/Alertness:  (drowsy/groggy) Orientation Level: Oriented to person Attention: Sustained Sustained Attention: Impaired Sustained Attention Impairment: Verbal basic;Functional basic Memory:  (TBA) Awareness: Impaired Awareness Impairment: Intellectual impairment;Emergent impairment;Anticipatory impairment Problem Solving: Impaired Problem Solving Impairment: Functional basic Safety/Judgment: Impaired       Comprehension  Auditory Comprehension Overall Auditory Comprehension: Impaired Yes/No Questions:  (TBA) Commands: Impaired One Step Basic Commands: 50-74% accurate (affected by apraxia) Interfering Components: Attention;Other (comment) (frustration) EffectiveTechniques: Extra processing time Visual Recognition/Discrimination Discrimination: Not tested Reading  Comprehension Reading Status:  (TBA)    Expression Expression Primary Mode of Expression: Verbal Verbal Expression Overall Verbal Expression: Other (comment) (suspect language overall intact) Initiation: No impairment Level of Generative/Spontaneous Verbalization: Phrase Repetition:  (TBA) Naming:  (very limited 2/2) Pragmatics: Impairment Impairments: Abnormal affect;Eye contact Interfering Components: Attention Written Expression Dominant Hand: Right Written Expression:  (TBA)   Oral /  Motor  Oral Motor/Sensory Function Overall Oral Motor/Sensory Function: Other (comment) (discoor with apraxia/strength adequate) Motor Speech Overall Motor Speech: Impaired Respiration: Impaired Level of Impairment: Conversation Phonation: Wet Resonance:  (will continue to monitor- hyponasal?) Articulation: Impaired Level of Impairment: Word Intelligibility: Intelligibility reduced Word: 50-74% accurate Motor Planning: Impaired Level of Impairment: Word   GO                    [name] 06/25/2020, 11:48 AM [name].Ed Risk analyst [phone] Office [phone]

## 2020-06-25 NOTE — Procedures (Signed)
Cortrak  Person Inserting Tube:  Maylon Peppers C, RD Tube Type:  Cortrak - 43 inches Tube Location:  Right nare Initial Placement:  Stomach Secured by: Bridle Technique Used to Measure Tube Placement:  Documented cm marking at nare/ corner of mouth Cortrak Secured At:  71 cm    Cortrak Tube Team Note:  Consult received to place a Cortrak feeding tube.   No x-ray is required. RN may begin using tube.    If the tube becomes dislodged please keep the tube and contact the Cortrak team at [email or web address] ([email or web address]) for replacement.  If after hours and replacement cannot be delayed, place a NG tube and confirm placement with an abdominal x-ray.    Lockie Pares., RD, LDN, CNSC See AMiON for contact information

## 2020-06-25 NOTE — Progress Notes (Signed)
Inpatient Rehabilitation Admissions Coordinator  I spoke with patient wife by phone to assess caregiver supports and discuss goals and expectations of a possible Cir admit. I await further medical progress before proceeding with insurance authorization with [facility] for admit. Patient would be a great candidate for Cir when medically ready and has good caregiver supports available.  Danne Baxter, RN, MSN Rehab Admissions Coordinator [phone] 06/25/2020 4:00 PM

## 2020-06-25 NOTE — Progress Notes (Signed)
Inpatient Rehab Admissions Coordinator Note:   Per therapy recommendations, pt was screened for CIR candidacy by Clemens Catholic, [place] CCC-SLP. At this time, Pt. Appears to have functional decline  a  Possible candidate for CIR. Will request order for rehab consult per protocol.  Please contact me with questions.   Clemens Catholic, [place], [place] Admissions Coordinator  [phone] ([place]) [phone] (office)

## 2020-06-25 NOTE — Progress Notes (Signed)
Physical Therapy Treatment Patient Details Name: Adrian Evans MRN: [id number] DOB: [birth year] Today's Date: 06/25/2020    History of Present Illness 35 yo M admitted to [name] 1/26 w n/v malaise, weakness. Hypotensive in ED and volume resuscitated. Worse AMS without clear etiology, transferring to [name] 1/29 for LP and cEEG. MRI revealed multiple scattered punctate acute ishcemic infarcts involving both cerebral hemisphers, R greater than L and a small remote L PCA territory infarct, with additional small remote L thalamic and R cerebellar infarcts. Pt extubated 2/1.    PT Comments    Pt admitted with above. 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 Pt now presenting with generalized weakness R worse than L, impaired co-ordination, impaired sensation, difficulty talking, significant delay in processing/response time, is easily frustrated and becomes emotional but follows simple commands majority of the time and actively participate despite significant delay. Pt to strongly benefit from CIR upon d/c from maximal functional return. Acute PT to cont to follow.    Follow Up Recommendations  CIR     Equipment Recommendations  Wheelchair (measurements PT);Wheelchair cushion (measurements PT);Hospital bed;3in1 (PT);Rolling walker with 5" wheels    Recommendations for Other Services Rehab consult     Precautions / Restrictions Precautions Precautions: Fall Precaution Comments: dysarthric, easily distracted and frustrated Restrictions Weight Bearing Restrictions: No    Mobility  Bed Mobility Overal bed mobility: Needs Assistance Bed Mobility: Rolling;Sidelying to Sit;Sit to Supine Rolling: Max assist;+2 for physical assistance Sidelying to sit: Max assist;+2 for physical assistance   Sit to supine: Max assist;+2 for physical assistance   General bed mobility comments: pt with minimal ability to initiate transfer, max directional verbal and  tactile cues provide, very delayed processing  Transfers Overall transfer level: Needs assistance Equipment used: 2 person hand held assist Transfers: Lateral/Scoot Transfers           General transfer comment: attempted to laterally scoot pt up along bed however pt unable to assist, unable at this time as pt often retropulses sliding bottom closer to EOB placing pt at high fall risk from chair  Ambulation/Gait             General Gait Details: unable at this time   Stairs             Wheelchair Mobility    Modified Rankin (Stroke Patients Only) Modified Rankin (Stroke Patients Only) Pre-Morbid Rankin Score: No symptoms Modified Rankin: Severe disability     Balance Overall balance assessment: Needs assistance Sitting-balance support: Feet supported;No upper extremity supported Sitting balance-Leahy Scale: Poor Sitting balance - Comments: occasional use of L UE to support self, maxA required due to lateral sway and inability to maintain midline.       Standing balance comment: not tested                            Cognition Arousal/Alertness: Lethargic Behavior During Therapy: Flat affect Overall Cognitive Status: Impaired/Different from baseline Area of Impairment: Orientation;Attention;Following commands;Safety/judgement;Problem solving                 Orientation Level: Disoriented to;Time;Situation Current Attention Level: Focused   Following Commands: Follows one step commands with increased time;Follows one step commands inconsistently (follows commands approx 60% of time) Safety/Judgement: Decreased awareness of safety;Decreased awareness of deficits   Problem Solving: Slow processing;Decreased initiation;Difficulty sequencing;Requires verbal cues;Requires tactile cues General Comments: pt with lethargy, easily  distracted require verbal and tactile cues to tend to task, pt with better command follow with R side but can follow  command on L just not as consistent. Pt unable to do more than one trial of a task prior to loosing attention/focus      Exercises      General Comments General comments (skin integrity, edema, etc.): bilat hand edema      Pertinent Vitals/Pain Pain Assessment: Faces Faces Pain Scale: Hurts even more Pain Location: to noxious stimuli on R side Pain Descriptors / Indicators: Grimacing Pain Intervention(s): Monitored during session    Home Living Family/patient expects to be discharged to:: Private residence Living Arrangements: Spouse/significant other Available Help at Discharge: Family;Available 24 hours/day Type of Home: House Home Access: Level entry   Home Layout: Two level;Able to live on main level with bedroom/bathroom Home Equipment: None Additional Comments: info provided by wife Adrian Evans    Prior Function Level of Independence: Independent      Comments: pt is a Careers adviser, and an Psychologist, occupational   PT Goals (current goals can now be found in the care plan section) Acute Rehab PT Goals Patient Stated Goal: didn't state PT Goal Formulation: With patient Time For Goal Achievement: 07/09/20 Potential to Achieve Goals: Good    Frequency    Min 4X/week      PT Plan      Co-evaluation              AM-PAC PT "6 Clicks" Mobility   Outcome Measure  Help needed turning from your back to your side while in a flat bed without using bedrails?: Total Help needed moving from lying on your back to sitting on the side of a flat bed without using bedrails?: Total Help needed moving to and from a bed to a chair (including a wheelchair)?: Total Help needed standing up from a chair using your arms (e.g., wheelchair or bedside chair)?: Total Help needed to walk in hospital room?: Total Help needed climbing 3-5 steps with a railing? : Total 6 Click Score: 6    End of Session   Activity Tolerance: Patient tolerated treatment well Patient left: in  bed;with call bell/phone within reach;with bed alarm set;with family/visitor present Nurse Communication: Mobility status PT Visit Diagnosis: Unsteadiness on feet (R26.81);Muscle weakness (generalized) (M62.81);Difficulty in walking, not elsewhere classified (R26.2)     Time: [id number] PT Time Calculation (min) (ACUTE ONLY): 28 min  Charges:  $Neuromuscular Re-education: 8-22 mins                     Kittie Plater, PT, DPT Acute Rehabilitation Services Pager #: [phone] Office #: [phone]    Berline Lopes 06/25/2020, 12:56 PM

## 2020-06-25 NOTE — Progress Notes (Signed)
Neurology Progress Note  S: Patient successfully extubated yesterday. Patient is evaluated at bedside this morning, with wife present in the room. Patient breathing normally on room air. Alert, awake at times. Difficult to check orientation. Able to state his name, wife's name, daughter's name, hospital, profession through [place]. Able to follow some midline and peripheral commands. 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 Discontinued VEEG today as no seizure activity noted thus far, continued Keppra. Patient failed swallow test, planning to do core track. Remains encephalopathic but improving. Blood pressure well controlled. Neurologically improving. At this point neurology will sign off, but will be available for any questions.   O: Current vital signs: BP (!) 163/109   Pulse (!) 105   Temp 98.7 F (37.1 C) (Axillary)   Resp 20   Wt 96 kg   SpO2 100%   BMI 28.70 kg/m  Vital signs in last 24 hours: Temp:  [97.7 F (36.5 C)-99.9 F (37.7 C)] 98.7 F (37.1 C) (02/02 0800) Pulse Rate:  [50-108] 105 (02/02 1000) Resp:  [0-20] 20 (02/02 1000) BP: (118-163)/(76-109) 163/109 (02/02 1000) SpO2:  [96 %-100 %] 100 % (02/02 1000) Weight:  [96 kg] 96 kg (02/02 0500)  GENERAL: Awake, alert in NAD HEENT: Normocephalic and atraumatic, dry mm LUNGS: Normal respiratory effort.  CV: RRR on tele.  ABDOMEN: Soft, nontender Ext: warm   NEURO:  Mental Status: Open eyes to vocal stimuli, able to follow simple midline and peripheral commands. Speech/Language: Clearly verbalize yes/no. Able to state his name, wife's name, daughter's name, hospital, profession through [place].  Cranial Nerves:  IMidline gaze present,,does not blink to threat, does not fixate or follow. Smile is symmetrical. Hearing intact to voice, hypophonic, tongue is midline without fasciculations. Motor: Bilateral upper extremities 4+/5,  does not move legs to commands, and does not resist passive movement, assessed with the help 2 /5 Sensation and coordination: Unable to assess Gait- deferred    Medications  Current Facility-Administered Medications:  .  0.9 %  sodium chloride infusion, 250 mL, Intravenous, Continuous, Anders Simmonds, MD .  Derrill Memo ON 06/26/2020] acyclovir (ZOVIRAX) 390 mg in dextrose 5 % 100 mL IVPB, 5 mg/kg (Ideal), Intravenous, Q24H, Mancheril, Darnell Level, RPH .  alteplase (CATHFLO ACTIVASE) injection 2 mg, 2 mg, Intracatheter, Once PRN, Roney Jaffe, MD .  ampicillin (OMNIPEN) 2 g in sodium chloride 0.9 % 100 mL IVPB, 2 g, Intravenous, Q12H, Mancheril, Darnell Level, RPH .  carvedilol (COREG) tablet 12.5 mg, 12.5 mg, Oral, BID WC, Chand, Sudham, MD .  cefTRIAXone (ROCEPHIN) 2 g in sodium chloride 0.9 % 100 mL IVPB, 2 g, Intravenous, Q12H, Toberman, Stevi W, NP, Last Rate: 200 mL/hr at 06/25/20 1109, 2 g at 06/25/20 1109 .  chlorhexidine (PERIDEX) 0.12 % solution 15 mL, 15 mL, Mouth Rinse, BID, Chand, Sudham, MD, 15 mL at 06/25/20 0944 .  Chlorhexidine Gluconate Cloth 2 % PADS 6 each, 6 each, Topical, Daily, Freddi Starr, MD, 6 each at 06/25/20 1102 .  docusate sodium (COLACE) capsule 100 mg, 100 mg, Oral, BID PRN, Bowser, Laurel Dimmer, NP .  famotidine (PEPCID) 40 MG/5ML suspension 20 mg, 20 mg, Per Tube, QHS, Chand, Sudham, MD .  heparin bolus via infusion syringe 1,000 Units, 1,000 Units, CRRT, PRN, Roney Jaffe, MD .  heparin injection 1,000-6,000 Units, 1,000-6,000 Units, CRRT, PRN, Roney Jaffe, MD, 2,800 Units at 06/24/20 1228 .  heparin injection 5,000 Units, 5,000  Units, Subcutaneous, Q8H, Jacky Kindle, MD, 5,000 Units at 06/25/20 0532 .  insulin aspart (novoLOG) injection 0-9 Units, 0-9 Units, Subcutaneous, Q4H, Jacky Kindle, MD, 1 Units at 06/25/20 0843 .  levETIRAcetam (KEPPRA) IVPB 1000 mg/100 mL premix, 1,000 mg, Intravenous, Q24H, Bowser, Laurel Dimmer, NP, Stopped at 06/25/20 [phone] .   lidocaine (PF) (XYLOCAINE) 1 % injection 5 mL, 5 mL, Intradermal, Once, Bhagat, Srishti L, MD .  MEDLINE mouth rinse, 15 mL, Mouth Rinse, q12n4p, Chand, Sudham, MD, 15 mL at 06/25/20 1110 .  norepinephrine (LEVOPHED) '4mg'$  in 259m premix infusion, 2-10 mcg/min, Intravenous, Titrated, DFreddi Starr MD, Stopped at 06/23/20 1102 .  ondansetron (ZOFRAN) injection 4 mg, 4 mg, Intravenous, Q6H PRN, Bowser, Grace E, NP .  polyethylene glycol (MIRALAX / GLYCOLAX) packet 17 g, 17 g, Oral, Daily PRN, Bowser, Grace E, NP .  sodium chloride 0.9 % primer fluid for CRRT, , CRRT, PRN, SRoney Jaffe MD, Given at 06/23/20 [phone].  vancomycin variable dose per unstable renal function (pharmacist dosing), , Does not apply, See admin instructions, MLavenia AtlasREn[facility]own[facility] CBC    Component Value Date/Time   WBC 11.0 (H) 06/25/2020 0537   RBC 2.92 (L) 06/25/2020 0537   HGB 9.2 (L) 06/25/2020 0537   HCT 27.1 (L) 06/25/2020 0537   PLT 317 06/25/2020 0537   MCV 92.8 06/25/2020 0537   MCH 31.5 06/25/2020 0537   MCHC 33.9 06/25/2020 0537   RDW 13.8 06/25/2020 0537   LYMPHSABS 1.0 06/25/2020 0537   MONOABS 1.1 (H) 06/25/2020 0537   EOSABS 0.2 06/25/2020 0537   BASOSABS 0.1 06/25/2020 0537    CMP     Component Value Date/Time   NA 135 06/25/2020 0537   K 3.8 06/25/2020 0537   CL 101 06/25/2020 0537   CO2 22 06/25/2020 0537   GLUCOSE 133 (H) 06/25/2020 0537   BUN 33 (H) 06/25/2020 0537   CREATININE 4.76 (H) 06/25/2020 0537   CALCIUM 8.1 (L) 06/25/2020 0537   PROT 6.0 (L) 06/22/2020 0513   ALBUMIN 1.7 (L) 06/25/2020 0537   AST 9 (L) 06/22/2020 0513   ALT 10 06/22/2020 0513   ALKPHOS 75 06/22/2020 0513   BILITOT 2.0 (H) 06/22/2020 0513   GFRNONAA 15 (L) 06/25/2020 0537   GFRAA 47 (L) 05/27/2018 0424     Lipid Panel     Component Value Date/Time   CHOL 111 06/22/2020 1038   TRIG 98 06/25/2020 0537   HDL 31 (L) 06/22/2020 1038   CHOLHDL 3.6 06/22/2020 1038   VLDL 36 06/22/2020 1038    LDLCALC 44 06/22/2020 1038     Imaging I have reviewed images in epic and the results pertinent to this consultation are:  CT Head showed: No acute abnormality, mucosal edema right maxillary sinus  CT angio head neck: No LVO.  MRI Brain showed : Multiple scattered punctate acute to early subacute ischemic white matter infarct involving the periventricular and deep white matter of both cerebral hemispheres, right greater than left. 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  TEE: No thrombus in left atrium, EF 20 to 25%, no left ventricular regional wall motion abnormalities.  Assessment: Mr. 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 He was admitted in hypovolemic shock, he was transferred to ICU with altered  mental status and seizure evaluation and was recommended hemodialysis. Started on Keppra, meningitis coverage continued on the v EEG. He is s/p LP on 1/30 and shows glucose 63, RBC 67, total protein 108, 0 WBC, opening pressure of 15, QuantiFERON and HIV negative. Hepatitis panel negative. Covid negative. Strep pneumonia negative. Autoimmune work-up is negative. Continued empiric coverage with acyclovir, ampicillin, ceftriaxone and vancomycin. Discontinued VEEG as no seizure activity noted thus far.  Impression: 1. Acute encephalopathy, multifactorial 2. ? 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 Multiple punctate cerebral infarct  Recommendations:  - Recommend to start ASA, Plavix for 3 weeks, then ASA alone, if no contraindication - Frequent neurochecks - Risk factor modification - Continue Keppra  - PT eval - OT eval - Seizure precautions -On subQ  Heparin, recommend to d/c SCD's  -At this point neurology will sign off, but will be available for any questions.At this point neurology will sign off, but will be available for any questions.

## 2020-06-25 NOTE — Progress Notes (Signed)
Pharmacy Antibiotic Note  Adrian Evans is a 35 y.o. male admitted on 06/21/2020 with meningitis.  Pharmacy has been consulted for acyclovir and vancomycin dosing. Patient is also on ceftriaxone and ampicillin.  CRRT stopped yesterday and transitioned to iHD. WBC 11, SCr up to 4.76   Plan: Acyclovir to '5mg'$ /kg per IBW Q24 hours while on HD  Vancomycin to 1 gm IV Q HD. F/u HD schedule  Ceftriaxone 2g Q12hr per MD  Ampicillin to 2g q12hr on iHD Monitor cultures, clinical status, renal fx  Narrow abx as able and f/u duration  F/u HD schedule per nephrology    Weight: 96 kg (211 lb 10.3 oz)  Temp (24hrs), Avg:98.5 F (36.9 C), Min:96.62 F (35.9 C), Max:99.9 F (37.7 C)  Recent Labs  Lab 06/18/20 1339 06/18/20 1508 06/18/20 1616 06/20/20 2036 06/21/20 0558 06/21/20 1746 06/22/20 0513 06/22/20 1554 06/23/20 0337 06/23/20 1549 06/24/20 1126 06/25/20 0537  WBC  --   --    < >  --  29.8*  --  18.6*  --  10.2  --  10.3 11.0*  CREATININE  --   --    < > 13.83* 14.94*   < > 10.97* 8.61* 6.05* 4.95* 3.44* 4.76*  LATICACIDVEN 0.9 0.6  --  1.1  --   --   --   --   --   --   --   --   VANCORANDOM  --   --   --   --   --   --   --  22  --   --   --   --    < > = values in this interval not displayed.    Estimated Creatinine Clearance: 25.5 mL/min (A) (by C-G formula based on SCr of 4.76 mg/dL (H)).    No Known Allergies  Antimicrobials this admission: CTX 1/28 >>  Vanc 1/28 >>  Ampicillin 1/28 >> Acyclovir 1/29 >>  Microbiology results: 1/29 COVID neg 1/30 Strep pneumo>> 1/30 CSF counts > glucose wnl, protein high, WBC neg  1/30 HSV PCR >>  1/30 CSF fungal smear >>  1/30 CSF cx >>  1/30 Varicella PCR >>   Albertina Parr, PharmD., BCPS, BCCCP Clinical Pharmacist Please refer to [facility] for unit-specific pharmacist

## 2020-06-25 NOTE — Consult Note (Signed)
Physical Medicine and Rehabilitation Consult Reason for Consult: Altered mental status with seizures Referring Physician: Internal medicine  HPI: Adrian Evans is a 35 y.o. right-handed male with history of type 1 diabetes mellitus, CKD stage III, hypertension as well as cardiomyopathy ejection fraction 20 to 25% followed by Dr. Serafina Royals.  History taken form chart review due to cognition. Patient lives with spouse.  Two-level home bed and bath main level.  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  He presented on 06/18/2020 with generalized weakness.  He also reported diffuse myalgias, nausea/vomiting, poor p.o. intake as well as nasal congestion. Patient is compliant with home medications.  Found to be hypotensive 67/50.  BUN 125, BNP 980, creatinine 16.42 from baseline 2.06, troponin 27, blood cultures no growth to date, CK 397.  Cranial CT personally reviewed, unremarkable for acute intracranial process.  Renal ultrasound with no hydronephrosis. There was a 2.1 cm solid echogenic mass in the right hepatic lobe not clearly identified.  Renal service follow-up for AKI on CKD placed on gentle IV fluids.  Echocardiogram with ejection fraction of 40-45%. The left ventricle demonstrated global hypokinesis.  Cardiology service was consulted in regards to suspect cardiomyopathy. On 06/20/2020 patient became more encephalopathic difficult to arouse. EEG negative for seizures but did show diffuse encephalopathy.  He was placed on Keppra for seizure prophylaxis.  A follow-up CT/MRI showed multiple scattered punctate acute early subacute ischemic white matter infarct involving the periventricular and deep white matter of both cerebral hemispheres right greater than left.  Small remote left PCA territory infarct with additional small remote left thalamic and right cerebellar infarcts.  Patient did require short-term intubation.  TEE completed  showing no thrombus negative bubble study.  Patient was placed on aspirin Plavix for 3 weeks and aspirin alone with recommendations of neurology services.  Subcutaneous heparin for DVT prophylaxis.  Patient is currently NPO with alternative means of nutritional support.  His renal function is stabilizing latest creatinine 4.76.  Patient with associated aphasia. Therapy evaluations completed due to patient's decrease in functional ability recommendations of physical medicine rehab consult.  Review of Systems  Unable to perform ROS: Mental acuity   Past Medical History:  Diagnosis Date  . Diabetes mellitus without complication [facility])    Past Surgical History:  Procedure Laterality Date  . EYE SURGERY    . RIGHT/LEFT HEART CATH AND CORONARY ANGIOGRAPHY N/A 05/25/2018   Procedure: RIGHT/LEFT HEART CATH AND CORONARY ANGIOGRAPHY;  Surgeon: Corey Skains, MD;  Location: [facility];  Service: Cardiovascular;  Laterality: N/A;  . TONSILLECTOMY     Family History  Problem Relation Age of Onset  . Hypertension Mother    Social History:  reports that he has never smoked. He has never used smokeless tobacco. He reports previous alcohol use. He reports previous drug use. Allergies: No Known Allergies Medications Prior to Admission  Medication Sig Dispense Refill  . acetaminophen (TYLENOL) 325 MG tablet Take 2 tablets (650 mg total) by mouth every 6 (six) hours as needed for mild pain (or Fever >/= 101). 1 tablet 0  . bisacodyl (DULCOLAX) 5 MG EC tablet Take 1 tablet (5 mg total) by mouth daily as needed for moderate constipation. 30 tablet 0  . Chlorhexidine Gluconate Cloth 2 % PADS Apply 6 each topically daily. 1 each 0  . insulin aspart (NOVOLOG) 100 UNIT/ML injection Inject 0-9 Units into the skin 3 (three) times  daily with meals. 10 mL 11  . insulin aspart (NOVOLOG) 100 UNIT/ML injection Inject 0-5 Units into the skin at bedtime. 10 mL 11  . levETIRAcetam (KEPRRA) 500 MG/100ML SOLN  Inject 100 mLs (500 mg total) into the vein daily. 4000 mL 0  . midodrine (PROAMATINE) 10 MG tablet Take 1 tablet (10 mg total) by mouth 3 (three) times daily with meals. 1 tablet 0  . norepinephrine (LEVOPHED) 16-5 MG/250ML-% SOLN Inject 0-40 mcg/min into the vein continuous. 1 mL 0  . sodium chloride 0.9 % infusion Inject 250 mLs into the vein continuous. 1 mL 0    Home: Home Living Family/patient expects to be discharged to:: Private residence Living Arrangements: Spouse/significant other Available Help at Discharge: Family,Available 24 hours/day Type of Home: House Home Access: Level entry Home Layout: Two level,Able to live on main level with bedroom/bathroom Alternate Level Stairs-Number of Steps: flight Home Equipment: None Additional Comments: info provided by wife [name]  Lives With: Spouse (2 kids 14 and 10yo)  Functional History: Prior Function Level of Independence: Independent Comments: pt is a Careers adviser, and an Multimedia programmer Status:  Mobility: Bed Mobility Overal bed mobility: Needs Assistance Bed Mobility: Rolling,Sidelying to Sit,Sit to Supine Rolling: Max assist,+2 for physical assistance Sidelying to sit: Max assist,+2 for physical assistance Sit to supine: Max assist,+2 for physical assistance General bed mobility comments: pt with minimal ability to initiate transfer, max directional verbal and tactile cues provide, very delayed processing Transfers Overall transfer level: Needs assistance Equipment used: 2 person hand held assist Transfers: Lateral/Scoot Transfers General transfer comment: attempted to laterally scoot pt up along bed however pt unable to assist, unable at this time as pt often retropulses sliding bottom closer to EOB placing pt at high fall risk from chair Ambulation/Gait General Gait Details: unable at this time    ADL:    Cognition: Cognition Overall Cognitive Status: Impaired/Different from  baseline Arousal/Alertness:  (drowsy/groggy) Orientation Level: Oriented to person Attention: Sustained Sustained Attention: Impaired Sustained Attention Impairment: Verbal basic,Functional basic Memory:  (TBA) Awareness: Impaired Awareness Impairment: Intellectual impairment,Emergent impairment,Anticipatory impairment Problem Solving: Impaired Problem Solving Impairment: Functional basic Safety/Judgment: Impaired Cognition Arousal/Alertness: Lethargic Behavior During Therapy: Flat affect Overall Cognitive Status: Impaired/Different from baseline Area of Impairment: Orientation,Attention,Following commands,Safety/judgement,Problem solving Orientation Level: Disoriented to,Time,Situation Current Attention Level: Focused Following Commands: Follows one step commands with increased time,Follows one step commands inconsistently (follows commands approx 60% of time) Safety/Judgement: Decreased awareness of safety,Decreased awareness of deficits Problem Solving: Slow processing,Decreased initiation,Difficulty sequencing,Requires verbal cues,Requires tactile cues General Comments: pt with lethargy, easily distracted require verbal and tactile cues to tend to task, pt with better command follow with R side but can follow command on L just not as consistent. Pt unable to do more than one trial of a task prior to loosing attention/focus  Blood pressure (!) 141/92, pulse (!) 113, temperature 98.7 F (37.1 C), temperature source Oral, resp. rate (!) 21, weight 96 kg, SpO2 100 %. Physical Exam Vitals reviewed.  Constitutional:      General: He is not in acute distress.    Appearance: He is not ill-appearing.  HENT:     Head: Normocephalic and atraumatic.     Comments: +NG    Right Ear: External ear normal.     Left Ear: External ear normal.     Nose: Nose normal.  Eyes:     General:        Right eye: No discharge.  Left eye: No discharge.     Extraocular Movements: Extraocular  movements intact.  Cardiovascular:     Rate and Rhythm: Regular rhythm. Tachycardia present.  Pulmonary:     Effort: Pulmonary effort is normal. No respiratory distress.     Breath sounds: No stridor.  Abdominal:     General: Abdomen is flat. Bowel sounds are normal. There is no distension.  Musculoskeletal:     Cervical back: Normal range of motion and neck supple.     Comments: No edema or tenderness in extremities  Skin:    General: Skin is warm and dry.  Neurological:     Mental Status: He is alert.     Comments: Alert Makes eye contact with examiner.   Dysarthria Follows one-step commands.   Global aphasia Motor: Limited due to participation, seen slightly moving b/l LE Hyperreflexia LLE Right gaze  Psychiatric:     Comments: Unable to assess due to aphasia and dysarthria     Results for orders placed or performed during the hospital encounter of 06/21/20 (from the past 24 hour(s))  Glucose, capillary     Status: Abnormal   Collection Time: 06/24/20  3:23 PM  Result Value Ref Range   Glucose-Capillary 150 (H) 70 - 99 mg/dL  Glucose, capillary     Status: Abnormal   Collection Time: 06/24/20  7:19 PM  Result Value Ref Range   Glucose-Capillary 135 (H) 70 - 99 mg/dL  Glucose, capillary     Status: Abnormal   Collection Time: 06/24/20 11:03 PM  Result Value Ref Range   Glucose-Capillary 115 (H) 70 - 99 mg/dL  Glucose, capillary     Status: Abnormal   Collection Time: 06/25/20  3:15 AM  Result Value Ref Range   Glucose-Capillary 119 (H) 70 - 99 mg/dL  Magnesium     Status: None   Collection Time: 06/25/20  5:37 AM  Result Value Ref Range   Magnesium 2.4 1.7 - 2.4 mg/dL  CBC with Differential/Platelet     Status: Abnormal   Collection Time: 06/25/20  5:37 AM  Result Value Ref Range   WBC 11.0 (H) 4.0 - 10.5 K/uL   RBC 2.92 (L) 4.22 - 5.81 MIL/uL   Hemoglobin 9.2 (L) 13.0 - 17.0 g/dL   HCT 27.1 (L) 39.0 - 52.0 %   MCV 92.8 80.0 - 100.0 fL   MCH 31.5 26.0 - 34.0  pg   MCHC 33.9 30.0 - 36.0 g/dL   RDW 13.8 11.5 - 15.5 %   Platelets 317 150 - 400 K/uL   nRBC 0.3 (H) 0.0 - 0.2 %   Neutrophils Relative % 72 %   Neutro Abs 7.9 (H) 1.7 - 7.7 K/uL   Lymphocytes Relative 9 %   Lymphs Abs 1.0 0.7 - 4.0 K/uL   Monocytes Relative 10 %   Monocytes Absolute 1.1 (H) 0.1 - 1.0 K/uL   Eosinophils Relative 2 %   Eosinophils Absolute 0.2 0.0 - 0.5 K/uL   Basophils Relative 1 %   Basophils Absolute 0.1 0.0 - 0.1 K/uL   WBC Morphology MILD LEFT SHIFT (1-5% METAS, OCC MYELO, OCC BANDS)    Immature Granulocytes 6 %   Abs Immature Granulocytes 0.70 (H) 0.00 - 0.07 K/uL  Renal function panel     Status: Abnormal   Collection Time: 06/25/20  5:37 AM  Result Value Ref Range   Sodium 135 135 - 145 mmol/L   Potassium 3.8 3.5 - 5.1 mmol/L   Chloride 101 98 -  111 mmol/L   CO2 22 22 - 32 mmol/L   Glucose, Bld 133 (H) 70 - 99 mg/dL   BUN 33 (H) 6 - 20 mg/dL   Creatinine, Ser 4.76 (H) 0.61 - 1.24 mg/dL   Calcium 8.1 (L) 8.9 - 10.3 mg/dL   Phosphorus 4.8 (H) 2.5 - 4.6 mg/dL   Albumin 1.7 (L) 3.5 - 5.0 g/dL   GFR, Estimated 15 (L) >60 mL/min   Anion gap 12 5 - 15  Triglycerides     Status: None   Collection Time: 06/25/20  5:37 AM  Result Value Ref Range   Triglycerides 98 <150 mg/dL  Glucose, capillary     Status: Abnormal   Collection Time: 06/25/20  7:18 AM  Result Value Ref Range   Glucose-Capillary 127 (H) 70 - 99 mg/dL  Glucose, capillary     Status: Abnormal   Collection Time: 06/25/20 11:12 AM  Result Value Ref Range   Glucose-Capillary 132 (H) 70 - 99 mg/dL   No results found.  Assessment/Plan: Diagnosis: Multiple scattered punctate acute early subacute ischemic white matter infarct involving the periventricular and deep white matter of both cerebral hemispheres right greater than left.   Stroke: Continue secondary stroke prophylaxis and Risk Factor Modification listed below:   Antiplatelet therapy:   Blood Pressure Management:  Continue current  medication with prn's with permisive HTN per primary team Statin Agent:   Diabetes management:   ? Quadraparesis: fit for orthosis to prevent contractures (resting hand splint for day, wrist cock up splint at night, PRAFO, etc) PT/OT for mobility, ADL training  Motor recovery: Zoloft Labs and images (see above) independently reviewed.  Records reviewed and summated above.  1. Does the need for close, 24 hr/day medical supervision in concert with the patient's rehab needs make it unreasonable for this patient to be served in a less intensive setting? Yes 2. Co-Morbidities requiring supervision/potential complications: type 1 DM with complication (Monitor in accordance with exercise and adjust meds as necessary), CKD stage III (repeat labs, avoid nephrotoxic meds), HTN (monitor and provide prns in accordance with increased physical exertion and pain), cardiomyopathy ejection fraction 20 to 25% (Monitor in accordance with increased physical activity and avoid UE resistance excercises), poststroke dysphagia (advance diet as tolerated) 3. Due to bladder management, bowel management, safety, skin/wound care, disease management, medication administration, pain management and patient education, does the patient require 24 hr/day rehab nursing? Yes 4. Does the patient require coordinated care of a physician, rehab nurse, therapy disciplines of  to address physical and functional deficits in the context of the above medical diagnosis(es)? Yes Addressing deficits in the following areas: balance, endurance, locomotion, strength, transferring, bowel/bladder control, bathing, dressing, feeding, grooming, toileting, cognition, speech, language, swallowing and psychosocial support 5. Can the patient actively participate in an intensive therapy program of at least 3 hrs of therapy per day at least 5 days per week? Potentially 6. The potential for patient to make measurable gains while on inpatient rehab is  excellent 7. Anticipated functional outcomes upon discharge from inpatient rehab are min assist  with PT, min assist with OT, supervision and min assist with SLP. 8. Estimated rehab length of stay to reach the above functional goals is: 21-25 days. 9. Anticipated discharge destination: TBD 10. Overall Rehab/Functional Prognosis: good  RECOMMENDATIONS: This patient's condition is appropriate for continued rehabilitative care in the following setting: CIR when medically stable and able tolerate 3 hours of therapy per day if adequate caregiver support available upon discharge. Patient  has agreed to participate in recommended program. Potentially Note that insurance prior authorization may be required for reimbursement for recommended care.  Comment: Rehab Admissions Coordinator to follow up.  I have personally performed a face to face diagnostic evaluation, including, but not limited to relevant history and physical exam findings, of this patient and developed relevant assessment and plan.  Additionally, I have reviewed and concur with the physician assistant's documentation above.   Delice Lesch, MD, ABPMR Lavon Paganini [place], PA-C 06/25/2020

## 2020-06-25 NOTE — Progress Notes (Signed)
NAME:  Adrian Evans, MRN:  [id number], DOB:  03-Nov-1985, LOS: 4 ADMISSION DATE:  06/21/2020, CONSULTATION DATE:  1/29 REFERRING MD:  [facility] CCM, CHIEF COMPLAINT:  AMS  Brief History:  35 yo M admitted to [facility] 1/26 w n/v malaise, weakness. Hypotensive in ED and volume resuscitated. Worse AMS without clear etiology, transferring to [facility] 1/29 for LP and cEEG    Past Medical History:  CKD III DM I Diabetic retinopathy HFrEF  Significant Hospital Events:  1/26 admitted, in shock felt to be hypovolemic  1/27 cards consult, nephrology consult. Surgery and vascular consults for HD access 1/28 hypotensive T to ICU. AMS, sz during tele neuro eval. Rec HD 1/29 Keppra, meningitis coverage, [facility]G LP 1/30 LP performed by IR, MRI showed multiple cerebral infarcts  Consults:  Nephro Vascular (for permcath) GenSurg (for eval for PD) Cards PCCM Neuro   Procedures:  1/30 LP 1/28 CVC>  Significant Diagnostic Tests:   1/26 renal US: no hydronephrosis. 2.1 solid mass in R hepatic lobe ? hemangioma  ECHO 1/27> LVEF 40- 45%, LV hypokinesis, Dilated LV, moderate LVH, Interventricular septum is flatted in diastole/D shaped. RV reduces systolic function. Enlarged LV. Small pericardial effusion posterior to LV, circumferrential, no evidence of tamponade   1/28 CT H no acute intracranial abnormality -- ill defined low density focus in L thalamus 1/28 CTA h/n> no LVO   1/29 MRI Brain 1. Multiple scattered punctate acute to early subacute ischemic white matter infarcts involving the periventricular and deep white matter of both cerebral hemispheres, right greater than left. No associated hemorrhage or mass effect. 2. 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   Micro Data:  1/26 COVID - neg  Antimicrobials:  1/29 acyclovir> 1/29 ampicillin> 1/29 rocephin> 1/29 vanc>  Interim History / Subjective:   Sedation was turned off yesterday and pt was extubated. No acute events.   Objective   Blood pressure 127/76, pulse 93, temperature 98.7 F (37.1 C), temperature source Axillary, resp. rate (!) 0, weight 96 kg, SpO2 100 %.        Intake/Output Summary (Last 24 hours) at 06/25/2020 0901 Last data filed at 06/25/2020 0800 Gross per 24 hour  Intake 1823.7 ml  Output 39 ml  Net 1784.7 ml   Filed Weights   06/22/20 0500 06/23/20 0500 06/25/20 0500  Weight: 98.8 kg 96.5 kg 96 kg    Examination: General: Critically ill appearing male, now extubated HEENT: [name], eyes anicteric, Right IJ in place  Cardiac: RRR, no m/r/g Pulmonary: Coarse breath sounds, no wheezing, rhonchi, or rales Abdomen: Soft, non-tender, non-distended, normoactive bowl sounds Extremity: No LE swelling, SCDs in place Neuro: Opens eyes with vocal stimuli, following simple commands, generalized weak.  No gaze preference. Attempts to speak and able to verbalize "yes/no"  Resolved Hospital Problem list     Assessment and plan:   Acute encephalopathy, multifactorial 2/2 multiple scattered punctate cerebral infarcts plus metabolic encephalopathy. Also with concern for seizures vs autoimmune process vs meningitis. S/p LP on 1/30 by IR LP results: glucose 63, RBC 67, total protein 108, 0 WBCs. Opening pressure of 15,  Quantiferon and HIV negative. Hepatitis panel negative. Covid negative.  Strep pneumoniae ag is negative Continue empiric coverage with acyclovir, ampicillin, ceftriaxone and vancomycin  We will discontinue vEEG, no seizure activity noted thus far, consistent with encephalopathy Continue keppra  Neurology follow-up is appreciated Autoimmune work-up is negative  Shock Concern for  sepsis secondary to CNS infection vs hypovolemic shock.  Remains off pressors  BP has remained stable. Twelve-lead EKG showed bigeminy with multiple PVCs TEE was done which ruled out infective endocarditis Continue  antibiotics as above  Multiple Cerebral Infarcts Unknown etiology at this time.  TEE was negative for thrombus or infective endocarditis  Acute renal failure on CKD 3a Anion gap metabolic acidosis Oliguric Cr 4.76 off CRRT, up from 3.8 Nephrology is following - Transition to IHD   Acute hypoxic respiratory failure, inability to protect airway due to altered mental status Extubated yesterday, tolerating well  Acute on chronic systolic/diastolic congestive heart failure Patient echocardiogram is suggestive of EF 40 to 45%, dilated LV and global hypokinesis associated with diastolic dysfunction. EKG with new TWIs as noted above Continue Aspirin Holding meds for now  Diabetes type I CBGs around 100s Continue sliding scale insulin Fingerstick goal 140-180  Best practice (evaluated daily)  Diet: NPO Pain/Anxiety/Delirium protocol (if indicated):  DVT prophylaxis: SCD GI prophylaxis: Famotidine Glucose control: SSI Mobility: Bedrest Disposition: Pt tolerated extubation well, remains hemodynamically stable with no pressors. Anticipate transfer to progressive unit.  Goals of Care:  Last date of multidisciplinary goals of care discussion: 1/30 Family and staff present: Wife, RN and Dr. Erin Fulling Summary of discussion: Continue aggressive measures Follow up goals of care discussion due: 06/29/20 Code Status: Full code  Labs   CBC: Recent Labs  Lab 06/18/20 1334 06/18/20 1616 06/21/20 0558 06/21/20 1953 06/21/20 2336 06/22/20 0513 06/23/20 0337 06/24/20 1126 06/25/20 0537  WBC 5.3   < > 29.8*  --   --  18.6* 10.2 10.3 11.0*  NEUTROABS 4.2  --   --   --   --  16.1* 8.4* 9.2* 7.9*  HGB 12.1*   < > 12.8*   < > 11.6* 11.8* 10.7* 10.1* 9.2*  HCT 35.5*   < > 35.8*   < > 34.0* 33.8* 30.2* 29.0* 27.1*  MCV 90.1   < > 87.5  --   --  88.0 89.1 89.8 92.8  PLT 222   < > 351  --   --  308 331 325 317   < > = values in this interval not displayed.    Basic Metabolic Panel: Recent  Labs  Lab 06/21/20 1746 06/21/20 1953 06/22/20 0513 06/22/20 1554 06/23/20 0337 06/23/20 1549 06/24/20 1126 06/25/20 0537  NA 136   < > 138 137 138 136 135 135  K 3.7   < > 3.9 3.2* 3.9 3.8 3.8 3.8  CL 104  --  107 107 108 102 104 101  CO2 13*  --  14* 16* 16* 20* 23 22  GLUCOSE 147*  --  111* 99 105* 124* 165* 133*  BUN 103*  --  76* 58* 41* 31* 28* 33*  CREATININE 15.51*  --  10.97* 8.61* 6.05* 4.95* 3.44* 4.76*  CALCIUM 7.7*  --  8.0* 7.7* 7.7* 7.7* 7.7* 8.1*  MG 2.2  --  2.1  --  2.2  --  2.4 2.4  PHOS 7.4*  --  4.3 2.8 2.1* 2.1* 2.5 4.8*   < > = values in this interval not displayed.   GFR: Estimated Creatinine Clearance: 25.5 mL/min (A) (by C-G formula based on SCr of 4.76 mg/dL (H)). Recent Labs  Lab 06/18/20 1339 06/18/20 1508 06/18/20 1616 06/20/20 2036 06/21/20 0558 06/21/20 1746 06/22/20 0513 06/23/20 0337 06/24/20 1126 06/25/20 0537  PROCALCITON  --   --   --  2.05 4.27 5.59  --   --   --   --  WBC  --   --    < >  --  29.8*  --  18.6* 10.2 10.3 11.0*  LATICACIDVEN 0.9 0.6  --  1.1  --   --   --   --   --   --    < > = values in this interval not displayed.    Liver Function Tests: Recent Labs  Lab 06/18/20 1334 06/20/20 2036 06/21/20 1746 06/22/20 0513 06/22/20 1554 06/23/20 0337 06/23/20 1549 06/24/20 1126 06/25/20 0537  AST 6* 10* 8* 9*  --   --   --   --   --   ALT '12 12 12 10  '$ --   --   --   --   --   ALKPHOS 78 69 73 75  --   --   --   --   --   BILITOT 1.2 2.0* 1.8* 2.0*  --   --   --   --   --   PROT 6.2* 6.5 5.9* 6.0*  --   --   --   --   --   ALBUMIN 2.6* 2.7* 2.3* 2.0*  2.0* 1.9* 1.8* 1.8* 2.0* 1.7*   Recent Labs  Lab 06/21/20 1746  LIPASE 22  AMYLASE 46   Recent Labs  Lab 06/20/20 2036  AMMONIA 25    ABG    Component Value Date/Time   PHART 7.276 (L) 06/21/2020 2336   PCO2ART 36.3 06/21/2020 2336   PO2ART 124 (H) 06/21/2020 2336   HCO3 17.4 (L) 06/21/2020 2336   TCO2 19 (L) 06/21/2020 2336   ACIDBASEDEF 9.0 (H)  06/21/2020 2336   O2SAT 99.0 06/21/2020 2336     Coagulation Profile: Recent Labs  Lab 06/18/20 1334  INR 1.2    Cardiac Enzymes: Recent Labs  Lab 06/18/20 1508 06/22/20 0513  CKTOTAL 397 198    HbA1C: Hgb A1c MFr Bld  Date/Time Value Ref Range Status  06/19/2020 08:35 AM 7.4 (H) 4.8 - 5.6 % Final    Comment:    (NOTE) Pre diabetes:          5.7%-6.4%  Diabetes:              >6.4%  Glycemic control for   <7.0% adults with diabetes   05/22/2018 10:58 PM 5.9 (H) 4.8 - 5.6 % Final    Comment:    (NOTE) Pre diabetes:          5.7%-6.4% Diabetes:              >6.4% Glycemic control for   <7.0% adults with diabetes     CBG: Recent Labs  Lab 06/24/20 1523 06/24/20 1919 06/24/20 2303 06/25/20 0315 06/25/20 0718  GLUCAP 150* [place], MS4 [facility]

## 2020-06-25 NOTE — Evaluation (Signed)
Clinical/Bedside Swallow Evaluation Patient Details  Name: Adrian Evans MRN: [id number] Date of Birth: Oct 13, 1985  Today's Date: 06/25/2020 Time: SLP Start Time (ACUTE ONLY): [id number] SLP Stop Time (ACUTE ONLY): 1007 SLP Time Calculation (min) (ACUTE ONLY): 14 min  Past Medical History:  Past Medical History:  Diagnosis Date  . Diabetes mellitus without complication [facility])    Past Surgical History:  Past Surgical History:  Procedure Laterality Date  . EYE SURGERY    . RIGHT/LEFT HEART CATH AND CORONARY ANGIOGRAPHY N/A 05/25/2018   Procedure: RIGHT/LEFT HEART CATH AND CORONARY ANGIOGRAPHY;  Surgeon: Corey Skains, MD;  Location: [place] [facility];  Service: Cardiovascular;  Laterality: N/A;  . TONSILLECTOMY     HPI:  Adrian Evans is a 35 y.o. male admitted with generalized weakness, malaise, nausea vomiting, nasal congestion/sneezing/cough. Found to be in shock, concern for sepis, acute renal failure and acute stroke. Intubated 1/29-2/1 after becoming difficult to arouse. MRI multiple scattered punctate acute to early subacute ischemic white matter infarcts involving the periventricular and deep white matter of both cerebral hemispheres, right greater than left. 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 PMH: type 1 diabetes, EF 20 to 25%, CKD stage IIIb, hypertension.  Chest x-ray showed severe cardiomegaly, pulmonary venous congestion, mild bilateral interstitial prominence (mild CHF versus pneumonitis cannot be excluded).   Assessment / Plan / Recommendation Clinical Impression  Pt is not appropriate for initiation of food/liquids due to apraxia, level of alertness, awareness and clinical indicators for aspiration. He is frustrated needing additional time and cues throughout assessment. Imprecise lingual/facial movements with spontaneous movements (grimacing, labial protrusion, retraction) and strength appears  adequate. Difficulty sealing lips around spoon, holding in anterior cavity and intermittently propel to initiate swallow with immediate throat clear on one of three trials. He expectorated bolus of water with straw presentation. Improved oral cohesion with puree and no outward indications of airway intrusion. He should remain NPO with oral care and continued intervention moving toward instrumental swallow assessment when appropriate. SLP Visit Diagnosis: Dysphagia, unspecified (R13.10)    Aspiration Risk  Moderate aspiration risk    Diet Recommendation NPO   Medication Administration: Via alternative means    Other  Recommendations Oral Care Recommendations: Oral care QID   Follow up Recommendations Inpatient Rehab      Frequency and Duration min 2x/week  2 weeks       Prognosis Prognosis for Safe Diet Advancement: Good      Swallow Study   General HPI: Adrian Evans is a 35 y.o. male admitted with generalized weakness, malaise, nausea vomiting, nasal congestion/sneezing/cough. Found to be in shock, concern for sepis, acute renal failure and acute stroke. Intubated 1/29-2/1 after becoming difficult to arouse. MRI multiple scattered punctate acute to early subacute ischemic white matter infarcts involving the periventricular and deep white matter of both cerebral hemispheres, right greater than left. 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 PMH: type 1 diabetes, EF 20 to 25%, CKD stage IIIb, hypertension.  Chest x-ray showed severe cardiomegaly, pulmonary venous congestion, mild bilateral interstitial prominence (mild CHF versus pneumonitis cannot be excluded). Type of Study: Bedside Swallow Evaluation Previous Swallow Assessment: none Diet Prior to this Study: NPO Temperature Spikes Noted: No Respiratory Status: Room air History of Recent Intubation: Yes Length of Intubations (days): 4 days Date extubated:  06/24/20 Behavior/Cognition: Alert;Other (Comment);Requires cueing (restless, very frustrated-) Oral Cavity Assessment:  Other (comment) (minimal candidias) Oral Care Completed by SLP: Recent completion by staff Oral Cavity - Dentition: Adequate natural dentition Vision:  (?) Self-Feeding Abilities: Needs assist Patient Positioning: Upright in bed Baseline Vocal Quality: Wet Volitional Cough: Other (Comment) (apraxia) Volitional Swallow: Unable to elicit    Oral/Motor/Sensory Function Overall Oral Motor/Sensory Function: Other (comment) (strength ok, discoor with apraxia)   Ice Chips Ice chips: Not tested   Thin Liquid Thin Liquid: Impaired Presentation: Spoon Oral Phase Impairments: Reduced labial seal;Reduced lingual movement/coordination;Poor awareness of bolus Oral Phase Functional Implications: Right anterior spillage;Left anterior spillage;Oral holding (expectorated bolus) Pharyngeal  Phase Impairments: Suspected delayed Swallow;Throat Clearing - Immediate    Nectar Thick Nectar Thick Liquid: Not tested   Honey Thick Honey Thick Liquid: Not tested   Puree Puree: Impaired Oral Phase Impairments: Reduced labial seal;Reduced lingual movement/coordination;Poor awareness of bolus Oral Phase Functional Implications: Prolonged oral transit Pharyngeal Phase Impairments: Suspected delayed Swallow   Solid     Solid: Not tested      [name] 06/25/2020,11:19 AM  [name].Ed Risk analyst [phone] Office [phone]

## 2020-06-25 NOTE — Progress Notes (Addendum)
[facility] Progress Note    Assessment/ Plan:   1. AKI on CKD 4 a. Baseline creatinine was 3.2 in October 2021.  EGFR of 27 mL/minute.  Underlying CKD is most likely due to T1DM.  Patient presented to the [facility] emergency department on 1/26 with SBP's in the 55s.  Renal ultrasound showed no evidence of obstruction.  Patient received emergent dialysis at [facility] was transferred to [facility] for neuro support.  He was intubated on arrival for airway protection and started on CRRT.  CRRT was initiated on 1/29 and patient continued on this until 2/1.  CRRT was discontinued at approximately 11 AM on 2/1.  Is +6 L since admission.  Creatinine 4.76 from 3.44 on 2/1.  Albumin of 1.7.  Phos 4.8.  Corrected calcium of 9.9.  Just prior to evaluation patient had in and out Foley catheter placed where approximately 375 mL of urine was drained.  This is his total urine output over the last 12 hours. b. We will continue to monitor urine output over the next 24 hours but patient will likely require dialysis. c. Daily renal panels d. Avoid nephrotoxic agents  2. Hypotension a. Most likely due to cardiac versus hypovolemic shock.  Has improved over the last 24 hours with blood pressures 118/91-147/95 3. VDRF a. Resolved, sedation discontinued on 2/1 and patient was extubated.  Resting comfortably and breathing without difficulty on evaluation.  Has difficulty speaking. 4. Seizures a. Likely due to uremia but may be multifactorial.  EEG completed 2/1 showed moderate diffuse encephalopathy but no seizures or epileptiform discharges throughout the recording. 5. T1DM-being managed by primary team, longstanding 6. HFrEF with an EF of 20-25% 7. Anemia-hemoglobin of 9.2 this morning, has not required treatment yet.  Patient seen and examined, agree with above note with above modifications. Pt remains extubated but is having neurologic issues.  A little more urine overnight by bladder scan but  crt rose off of HD. Will plan for IHD treatment tomorrow with out volume removal, I also fear that he will remain ESRD after these events-  Have told wife this.  Issue was raised that this could be dialysis disequilibrium-  In my experience this does not lead to lingering sxms such as this.  NO BP meds-  Try to keep BP stable given neuro issues and evidence of CVA's on MRI.  hgb is dropping-  May need treatment soon-  [employer] stores are OK    Corliss Parish, MD 06/25/2020     Subjective:   CRRT stopped yesterday AM- remains extubated but neurologically is having issues-  Difficult and pressured speech-  Gets frustrated - wife remains at bedsde very patient-  Did have more UOP today discovered on bladder scan-  375 ccs   Objective:   BP (!) 141/92   Pulse (!) 113   Temp 98.7 F (37.1 C) (Oral)   Resp (!) 21   Wt 96 kg   SpO2 100%   BMI 28.70 kg/m   Intake/Output Summary (Last 24 hours) at 06/25/2020 1245 Last data filed at 06/25/2020 0900 Gross per 24 hour  Intake 1676.29 ml  Output 375 ml  Net 1301.29 ml   Weight change:   Physical Exam: Gen: Awake but limited responses to questions, has difficulty finding words and speaking CVS: Tachycardic with regular rhythm, no murmurs noted Resp: Normal work of breathing, lungs clear to auscultation bilaterally Abd: Soft, nontender, positive bowel sounds Ext: No edema noted Dialysis Access: Patient has temporary HD  cath in for 4 days at this time  Imaging: No results found.  Labs: BMET Recent Labs  Lab 06/21/20 1746 06/21/20 1953 06/21/20 2336 06/22/20 0513 06/22/20 1554 06/23/20 0337 06/23/20 1549 06/24/20 1126 06/25/20 0537  NA 136   < > 139 138 137 138 136 135 135  K 3.7   < > 3.9 3.9 3.2* 3.9 3.8 3.8 3.8  CL 104  --   --  107 107 108 102 104 101  CO2 13*  --   --  14* 16* 16* 20* 23 22  GLUCOSE 147*  --   --  111* 99 105* 124* 165* 133*  BUN 103*  --   --  76* 58* 41* 31* 28* 33*  CREATININE 15.51*  --   --  10.97*  8.61* 6.05* 4.95* 3.44* 4.76*  CALCIUM 7.7*  --   --  8.0* 7.7* 7.7* 7.7* 7.7* 8.1*  PHOS 7.4*  --   --  4.3 2.8 2.1* 2.1* 2.5 4.8*   < > = values in this interval not displayed.   CBC Recent Labs  Lab 06/22/20 0513 06/23/20 0337 06/24/20 1126 06/25/20 0537  WBC 18.6* 10.2 10.3 11.0*  NEUTROABS 16.1* 8.4* 9.2* 7.9*  HGB 11.8* 10.7* 10.1* 9.2*  HCT 33.8* 30.2* 29.0* 27.1*  MCV 88.0 89.1 89.8 92.8  PLT 308 331 325 317    Medications:    . carvedilol  12.5 mg Oral BID WC  . chlorhexidine  15 mL Mouth Rinse BID  . Chlorhexidine Gluconate Cloth  6 each Topical Daily  . famotidine  20 mg Per Tube QHS  . heparin injection (subcutaneous)  5,000 Units Subcutaneous Q8H  . insulin aspart  0-9 Units Subcutaneous Q4H  . lidocaine (PF)  5 mL Intradermal Once  . mouth rinse  15 mL Mouth Rinse q12n4p  . vancomycin variable dose per unstable renal function (pharmacist dosing)   Does not apply See admin instructions     Gifford Shave, MD  [place] Resident, PGY2 06/25/2020, 12:45 PM

## 2020-06-26 DIAGNOSIS — N185 Chronic kidney disease, stage 5: Secondary | ICD-10-CM

## 2020-06-26 DIAGNOSIS — E1022 Type 1 diabetes mellitus with diabetic chronic kidney disease: Secondary | ICD-10-CM

## 2020-06-26 LAB — CBC WITH DIFFERENTIAL/PLATELET
Abs Immature Granulocytes: 0.55 10*3/uL — ABNORMAL HIGH (ref 0.00–0.07)
Basophils Absolute: 0.1 10*3/uL (ref 0.0–0.1)
Basophils Relative: 1 %
Eosinophils Absolute: 0.3 10*3/uL (ref 0.0–0.5)
Eosinophils Relative: 2 %
HCT: 27 % — ABNORMAL LOW (ref 39.0–52.0)
Hemoglobin: 9.2 g/dL — ABNORMAL LOW (ref 13.0–17.0)
Immature Granulocytes: 5 %
Lymphocytes Relative: 10 %
Lymphs Abs: 1 10*3/uL (ref 0.7–4.0)
MCH: 32.3 pg (ref 26.0–34.0)
MCHC: 34.1 g/dL (ref 30.0–36.0)
MCV: 94.7 fL (ref 80.0–100.0)
Monocytes Absolute: 0.9 10*3/uL (ref 0.1–1.0)
Monocytes Relative: 9 %
Neutro Abs: 8 10*3/uL — ABNORMAL HIGH (ref 1.7–7.7)
Neutrophils Relative %: 73 %
Platelets: 286 10*3/uL (ref 150–400)
RBC: 2.85 MIL/uL — ABNORMAL LOW (ref 4.22–5.81)
RDW: 13.7 % (ref 11.5–15.5)
WBC: 10.9 10*3/uL — ABNORMAL HIGH (ref 4.0–10.5)
nRBC: 0.2 % (ref 0.0–0.2)

## 2020-06-26 LAB — GLUCOSE, CAPILLARY
Glucose-Capillary: 219 mg/dL — ABNORMAL HIGH (ref 70–99)
Glucose-Capillary: 173 mg/dL — ABNORMAL HIGH (ref 70–99)
Glucose-Capillary: 186 mg/dL — ABNORMAL HIGH (ref 70–99)
Glucose-Capillary: 178 mg/dL — ABNORMAL HIGH (ref 70–99)
Glucose-Capillary: 192 mg/dL — ABNORMAL HIGH (ref 70–99)
Glucose-Capillary: 177 mg/dL — ABNORMAL HIGH (ref 70–99)

## 2020-06-26 LAB — RENAL FUNCTION PANEL
Albumin: 1.8 g/dL — ABNORMAL LOW (ref 3.5–5.0)
Anion gap: 15 (ref 5–15)
BUN: 44 mg/dL — ABNORMAL HIGH (ref 6–20)
CO2: 18 mmol/L — ABNORMAL LOW (ref 22–32)
Calcium: 8.4 mg/dL — ABNORMAL LOW (ref 8.9–10.3)
Chloride: 103 mmol/L (ref 98–111)
Creatinine, Ser: 5.88 mg/dL — ABNORMAL HIGH (ref 0.61–1.24)
GFR, Estimated: 12 mL/min — ABNORMAL LOW (ref >60)
Glucose, Bld: 197 mg/dL — ABNORMAL HIGH (ref 70–99)
Phosphorus: 5.2 mg/dL — ABNORMAL HIGH (ref 2.5–4.6)
Potassium: 4.6 mmol/L (ref 3.5–5.1)
Sodium: 136 mmol/L (ref 135–145)

## 2020-06-26 LAB — VARICELLA-ZOSTER BY PCR: Varicella-Zoster, PCR: NEGATIVE

## 2020-06-26 LAB — MAGNESIUM: Magnesium: 2.5 mg/dL — ABNORMAL HIGH (ref 1.7–2.4)

## 2020-06-26 MED ORDER — PROSOURCE TF PO LIQD
90.0000 mL | Freq: Two times a day (BID) | ORAL | Status: DC
  Administered 2020-06-26 – 2020-06-29 (×6): 90 mL
  Filled 2020-06-26 (×7): qty 90

## 2020-06-26 MED ORDER — OSMOLITE 1.5 CAL PO LIQD
1000.0000 mL | ORAL | Status: DC
  Administered 2020-06-26: 1000 mL

## 2020-06-26 MED ORDER — CARVEDILOL 12.5 MG PO TABS
25.0000 mg | ORAL_TABLET | Freq: Two times a day (BID) | ORAL | Status: DC
  Administered 2020-06-26 – 2020-06-29 (×5): 25 mg
  Filled 2020-06-26 (×5): qty 2

## 2020-06-26 MED ORDER — ASPIRIN EC 81 MG PO TBEC
81.0000 mg | DELAYED_RELEASE_TABLET | Freq: Every day | ORAL | Status: DC
  Filled 2020-06-26: qty 1

## 2020-06-26 MED ORDER — VANCOMYCIN HCL IN DEXTROSE 1-5 GM/200ML-% IV SOLN
INTRAVENOUS | Status: AC
  Filled 2020-06-26: qty 200

## 2020-06-26 MED ORDER — DARBEPOETIN ALFA 60 MCG/0.3ML IJ SOSY
60.0000 ug | PREFILLED_SYRINGE | INTRAMUSCULAR | Status: DC
  Administered 2020-06-28: 60 ug via INTRAVENOUS

## 2020-06-26 MED ORDER — ASPIRIN 81 MG PO CHEW
81.0000 mg | CHEWABLE_TABLET | Freq: Every day | ORAL | Status: DC
  Administered 2020-06-26 – 2020-06-29 (×4): 81 mg via NASOGASTRIC
  Filled 2020-06-26 (×4): qty 1

## 2020-06-26 MED ORDER — CLOPIDOGREL BISULFATE 75 MG PO TABS
75.0000 mg | ORAL_TABLET | Freq: Every day | ORAL | Status: DC
  Administered 2020-06-26 – 2020-06-29 (×4): 75 mg via NASOGASTRIC
  Filled 2020-06-26 (×3): qty 1

## 2020-06-26 MED ORDER — VANCOMYCIN HCL IN DEXTROSE 1-5 GM/200ML-% IV SOLN
1000.0000 mg | Freq: Once | INTRAVENOUS | Status: AC
  Administered 2020-06-26: 1000 mg via INTRAVENOUS

## 2020-06-26 MED ORDER — CLOPIDOGREL BISULFATE 75 MG PO TABS
75.0000 mg | ORAL_TABLET | Freq: Every day | ORAL | Status: DC
  Filled 2020-06-26: qty 1

## 2020-06-26 NOTE — Progress Notes (Signed)
Physical Therapy Treatment Patient Details Name: Adrian Evans MRN: [id number] DOB: 16-Jan-1986 Today's Date: 06/26/2020    History of Present Illness 35 yo M admitted to [facility] 1/26 w n/v malaise, weakness. Hypotensive in ED and volume resuscitated. Worse AMS without clear etiology, transferring to [facility] 1/29 for LP and cEEG. MRI revealed multiple scattered punctate acute ishcemic infarcts involving both cerebral hemisphers, R greater than L and a small remote L PCA territory infarct, with additional small remote L thalamic and R cerebellar infarcts. Pt extubated 2/1.    PT Comments    Treated pt in conjunction with OT to maximize pt safety and quality of session and as pt was lethargic this date and likely unable to participate in 2 sessions today. Attempted to awaken pt via use of sternal rubs, wash cloth to face, and passively moving limbs but pt did not open eyes until trunk was passively rocked. Pt displays difficulty forming words, ataxic movements, and delayed processing and reactional responses throughout session. He required maxAx2 for all bed mob and TAx2 with bil knee block to stand 1x. Attempted utilizing mirror for visual cues to facilitate midline orientation sitting EOB as he tends to lean posteriorly, but pt unable to maintain attention on mirror to acknowledge and correct self. Pt benefits from quiet environments and extra time to respond to all cues. Pt able to sit EOB majority of time with modA with brief episodes of min guard-A when leaning anteriorly with hands on thighs. Will continue to follow acutely. Pt would greatly benefit from intensive therapy services in the CIR setting to maximize his independence and safety with all functional mobility as he is very young, functioning significantly below his baseline, and has good family support and motivation to improve.   Follow Up Recommendations  CIR     Equipment Recommendations  Wheelchair (measurements PT);Wheelchair cushion  (measurements PT);Hospital bed;3in1 (PT);Rolling walker with 5" wheels;Other (comment) (mechanical lift; may change as pt progresses)    Recommendations for Other Services Rehab consult     Precautions / Restrictions Precautions Precautions: Fall Precaution Comments: dysarthric, easily distracted and frustrated, delayed response Restrictions Weight Bearing Restrictions: No    Mobility  Bed Mobility Overal bed mobility: Needs Assistance Bed Mobility: Sit to Supine;Supine to Sit     Supine to sit: Max assist;+2 for physical assistance;+2 for safety/equipment;HOB elevated Sit to supine: Max assist;+2 for physical assistance;+2 for safety/equipment   General bed mobility comments: Max multi-modal cues to initiate leg movement towards L EOB, needing AAROM and count down from 3 for pt to respond and assist. MaxAx2 to manage legs and trunk for supine <> sit.  Transfers Overall transfer level: Needs assistance Equipment used: 2 person hand held assist Transfers: Sit to/from Stand Sit to Stand: Total assist;+2 physical assistance;+2 safety/equipment;From elevated surface         General transfer comment: Sit to stand 1x from elevated EOB with bil knee block and use of pad as sling under buttocks to assist. No activation from pt noted thus TAx2.  Ambulation/Gait             General Gait Details: unable at this time   Stairs             Wheelchair Mobility    Modified Rankin (Stroke Patients Only) Modified Rankin (Stroke Patients Only) Pre-Morbid Rankin Score: No symptoms Modified Rankin: Severe disability     Balance Overall balance assessment: Needs assistance Sitting-balance support: Feet supported;Bilateral upper extremity supported;No upper extremity supported Sitting balance-Leahy  Scale: Poor Sitting balance - Comments: On average needing modA for balance but bouts of maxA during moments of retropulseiv movements and brief periods of only needing min  guard-A, especially when pt leaned anteriorly with arms on thighs. Postural control: Posterior lean Standing balance support: Bilateral upper extremity supported Standing balance-Leahy Scale: Zero Standing balance comment: TAx2 and bil knee block for static standing 1x ~10 sec, no activation noted from pt.                            Cognition Arousal/Alertness: Lethargic Behavior During Therapy: Flat affect Overall Cognitive Status: Impaired/Different from baseline Area of Impairment: Attention;Following commands;Safety/judgement;Problem solving;Awareness                   Current Attention Level: Alternating   Following Commands: Follows one step commands with increased time;Follows one step commands inconsistently (follows commands approx 70% of time) Safety/Judgement: Decreased awareness of safety;Decreased awareness of deficits Awareness: Intellectual Problem Solving: Slow processing;Decreased initiation;Difficulty sequencing;Requires verbal cues;Requires tactile cues General Comments: pt with lethargy upon arrival and needing cues inetrmittently to open eyes during session once sitting up. Extra time of up to ~10 seconds to respond to simple cues. responds better to multi-modal cues. Dealyed processing and reactional strategies with poor awareness of safety or deficits. Easily distracted and benefits from quiet environments when cuing pt.      Exercises      General Comments General comments (skin integrity, edema, etc.): Ataxic movements throughout; Heels floated end of session      Pertinent Vitals/Pain Pain Assessment: Faces Faces Pain Scale: Hurts a little bit Pain Location: generalized with noxious stimuli Pain Descriptors / Indicators: Grimacing Pain Intervention(s): Limited activity within patient's tolerance;Monitored during session;Repositioned    Home Living                      Prior Function            PT Goals (current goals can  now be found in the care plan section) Acute Rehab PT Goals Patient Stated Goal: to go to bed PT Goal Formulation: With patient Time For Goal Achievement: 07/09/20 Potential to Achieve Goals: Good Progress towards PT goals: Progressing toward goals    Frequency    Min 4X/week      PT Plan Current plan remains appropriate    Co-evaluation PT/OT/SLP Co-Evaluation/Treatment: Yes Reason for Co-Treatment: Necessary to address cognition/behavior during functional activity;For patient/therapist safety;To address functional/ADL transfers PT goals addressed during session: Balance;Mobility/safety with mobility        AM-PAC PT "6 Clicks" Mobility   Outcome Measure  Help needed turning from your back to your side while in a flat bed without using bedrails?: Total Help needed moving from lying on your back to sitting on the side of a flat bed without using bedrails?: A Lot Help needed moving to and from a bed to a chair (including a wheelchair)?: Total Help needed standing up from a chair using your arms (e.g., wheelchair or bedside chair)?: Total Help needed to walk in hospital room?: Total Help needed climbing 3-5 steps with a railing? : Total 6 Click Score: 7    End of Session Equipment Utilized During Treatment: Gait belt Activity Tolerance: Patient tolerated treatment well Patient left: in bed;with call bell/phone within reach;with bed alarm set;with family/visitor present Nurse Communication: Mobility status;Other (comment);Need for lift equipment (to prevent pressure ulcers via regular position changes and prevalon  boots) PT Visit Diagnosis: Unsteadiness on feet (R26.81);Muscle weakness (generalized) (M62.81);Difficulty in walking, not elsewhere classified (R26.2);Other symptoms and signs involving the nervous system (R29.898)     Time: 1350-1430 PT Time Calculation (min) (ACUTE ONLY): 40 min  Charges:  $Neuromuscular Re-education: 8-22 mins                     Moishe Spice, PT, DPT Acute Rehabilitation Services  Pager: [phone] Office: [place] 06/26/2020, 2:56 PM

## 2020-06-26 NOTE — Progress Notes (Signed)
PROGRESS NOTE    Adrian Evans  [id number] DOB: [birth year] DOA: 06/21/2020 PCP: Lenard Simmer, MD    Brief Narrative:  Patient is 35 year old gentleman who has history of type 1 diabetes on insulin, diabetic retinopathy, hypertension, stage IIIb chronic kidney disease who presented to [facility] on 1/26 with nausea, vomiting, malaise and weakness.  Patient went to work in the morning, he did not feel well so taken to the emergency room.  Wife reports that he had poor appetite and felt body ache for the last few days. Significant events: 1/26 admitted at [facility] in shock felt to be hypovolemic  1/27 cards consult, nephrology consult. Surgery and vascular consults for HD access 1/28 hypotensive and transferred to ICU. AMS, sz-like episodes During tele neuro eval. Rec HD 1/29 Keppra, meningitis coverage, [facility] and LP 1/30 LP performed by IR, MRI showed multiple cerebral infarcts1/26 admitted, in shock felt to be hypovolemic   1/28 CT H no acute intracranial abnormality -- ill defined low density focus in L thalamus 1/28 CTA h/n> no LVO   1/29 MRI Brain 1. Multiple scattered punctate acute to early subacute ischemic white matter infarcts involving the periventricular and deep white matter of both cerebral hemispheres, right greater than left. No associated hemorrhage or mass effect. 2. 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   Assessment & Plan:   Active Problems:   Acute encephalopathy   Acute cerebral infarction ([place])   Diabetes mellitus type I ([place])   Stage 3 chronic kidney disease ([place])   Essential hypertension   Chronic systolic congestive heart failure (HCC)   Dysphagia, post-stroke  Acute encephalopathy, multifactorial Suspected secondary to uremia, multiple stroke. His mental status remains poor but slowly improving. Patient will need aggressive rehab.  Suspected  meningitis: Less likely. He did present with viral syndrome. Negative for bacterial meningitis. Negative for autoimmune meningitis.  QuantiFERON and HIV negative.  Hepatitis panel negative.  Covid negative.  Streptococcus pneumoniae antigen negative.  Autoimmune work-up negative. HSV pending. Varicella-zoster PCR negative. Discontinue all antibiotics, this is likely viral syndrome. Continue Acyclovir until HSV reported.  Seizure disorder: Reported seizure like episodes more likely uremic symptoms.  Seen by neurology.  Currently remains on Keppra. 3 days video EEG did not demonstrate any seizures.  Shock: Suspect hypovolemic shock.  Transiently on vasopressors.  Now off vasopressors.  Acute stroke: Acute multiple territory stroke thought to be secondary to hypotension and hypoperfusion. Encephalopathic, no focal deficit.  Aggressive rehab. Refer to CIR. Aspirin and plavix for 3 weeks and aspirin alone as suggested by neurology. LDL 44, no statin needed  A1c 7.4, on insulin.  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  Received dialysis 2/3, nephrology following. Has right IJ temporary catheter.  Acute hypoxemic respiratory failure secondary to altered mental status: Extubated to nasal cannula oxygen on 2/1. Now on room air.  Acute on chronic combined heart failure: Previous EF 45%.  TEE with ejection fraction of 20 to 25%. Suspect acute cardiomyopathy stress. Started on coreg. Will not tolerate ACE or ARB Will repeat limited echocardiogram after clinical improvement. Avoiding diuretics. Fluid balance achieved by dialysis.  Type 1 diabetes: Currently only on sliding scale insulin.  Diet: Tube feeding, n.p.o. due to mental status    DVT prophylaxis: heparin injection 5,000 Units Start: 06/24/20 1400 SCDs Start: 06/21/20 1551  Code Status:  Full code Family Communication: Wife at the bedside Disposition Plan: Status is: Inpatient  Remains inpatient appropriate because:Persistent severe electrolyte disturbances, Altered mental status and Inpatient level of care appropriate due to severity of illness   Dispo: The patient is from: Home              Anticipated d/c is to: CIR              Anticipated d/c date is: 3 days              Patient currently is not medically stable to d/c.   Difficult to place patient No         Consultants:   PCCM  Neurology  Surgery  Procedures:   Multiple procedures.  Now on hemodialysis.  Antimicrobials:  1/29 acyclovir>  1/29 ampicillin> 2/3 1/29 rocephin> 2/3 1/29 vanc> 2/3   Subjective: Patient seen and examined.  Wife was at the bedside.  He was working with physical and Occupational Therapy.  Patient was able to follow simple commands.  He was having difficulties to keep his back is straight.  He was not able to express any concerns.  Came back from dialysis.  Objective: Vitals:   06/26/20 0930 06/26/20 1005 06/26/20 1015 06/26/20 1055  BP: (!) 154/103 (!) 136/95 (!) 164/102 (!) 151/91  Pulse:    99  Resp:  (!) 23  18  Temp:   98.6 F (37 C) 98.3 F (36.8 C)  TempSrc:   Axillary Axillary  SpO2:  93%  98%  Weight:  103.9 kg      Intake/Output Summary (Last 24 hours) at 06/26/2020 1423 Last data filed at 06/26/2020 1005 Gross per 24 hour  Intake 413.33 ml  Output 696 ml  Net -282.67 ml   Filed Weights   06/25/20 0500 06/26/20 0730 06/26/20 1005  Weight: 96 kg 103.9 kg 103.9 kg    Examination:  General exam: Appears calm , comfortable, lethargic. Frail and debilitated. Respiratory system: Clear to auscultation. Respiratory effort normal. No added sounds. Cardiovascular system: S1 & S2 heard, RRR. No JVD, murmurs, rubs, gallops or clicks. No pedal edema. Gastrointestinal system: soft and non tender. BS present. Tube feeding infusing. Central nervous system:  Alert but not able to ask orientation questions. Mumbles answers and mostly appropriate. Extremities: generalized weakness. condom catheter with dark urine     Data Reviewed: I have personally reviewed following labs and imaging studies  CBC: Recent Labs  Lab 06/22/20 0513 06/23/20 0337 06/24/20 1126 06/25/20 0537 06/25/20 1920 06/26/20 0335  WBC 18.6* 10.2 10.3 11.0* 10.5 10.9*  NEUTROABS 16.1* 8.4* 9.2* 7.9*  --  8.0*  HGB 11.8* 10.7* 10.1* 9.2* 9.1* 9.2*  HCT 33.8* 30.2* 29.0* 27.1* 26.6* 27.0*  MCV 88.0 89.1 89.8 92.8 91.7 94.7  PLT 308 331 325 317 312 [id number]   Basic Metabolic Panel: Recent Labs  Lab 06/22/20 0513 06/22/20 1554 06/23/20 0337 06/23/20 1549 06/24/20 1126 06/25/20 0537 06/25/20 2000 06/26/20 0335  NA 138   < > 138 136 135 135 137 136  K 3.9   < > 3.9 3.8 3.8 3.8 3.9 4.6  CL 107   < > 108 102 104 101 102 103  CO2 14*   < > 16* 20* '23 22 22 '$ 18*  GLUCOSE 111*   < > 105* 124* 165* 133* 190* 197*  BUN 76*   < > 41* 31* 28* 33* 39* 44*  CREATININE 10.97*   < > 6.05*  4.95* 3.44* 4.76* 5.60* 5.88*  CALCIUM 8.0*   < > 7.7* 7.7* 7.7* 8.1* 8.4* 8.4*  MG 2.1  --  2.2  --  2.4 2.4  --  2.5*  PHOS 4.3   < > 2.1* 2.1* 2.5 4.8* 5.3* 5.2*   < > = values in this interval not displayed.   GFR: Estimated Creatinine Clearance: 21.4 mL/min (A) (by C-G formula based on SCr of 5.88 mg/dL (H)). Liver Function Tests: Recent Labs  Lab 06/20/20 2036 06/21/20 1746 06/22/20 0513 06/22/20 1554 06/23/20 1549 06/24/20 1126 06/25/20 0537 06/25/20 2000 06/26/20 0335  AST 10* 8* 9*  --   --   --   --   --   --   ALT '12 12 10  '$ --   --   --   --   --   --   ALKPHOS 69 73 75  --   --   --   --   --   --   BILITOT 2.0* 1.8* 2.0*  --   --   --   --   --   --   PROT 6.5 5.9* 6.0*  --   --   --   --   --   --   ALBUMIN 2.7* 2.3* 2.0*  2.0*   < > 1.8* 2.0* 1.7* 1.7* 1.8*   < > = values in this interval not displayed.   Recent Labs  Lab 06/21/20 1746  LIPASE 22  AMYLASE  46   Recent Labs  Lab 06/20/20 2036  AMMONIA 25   Coagulation Profile: No results for input(s): INR, PROTIME in the last 168 hours. Cardiac Enzymes: Recent Labs  Lab 06/22/20 0513  CKTOTAL 198   BNP (last 3 results) No results for input(s): PROBNP in the last 8760 hours. HbA1C: No results for input(s): HGBA1C in the last 72 hours. CBG: Recent Labs  Lab 06/25/20 2026 06/25/20 2353 06/26/20 0408 06/26/20 0643 06/26/20 1226  GLUCAP 174* 184* 219* 173* 186*   Lipid Profile: Recent Labs    06/25/20 0537  TRIG 98   Thyroid Function Tests: No results for input(s): TSH, T4TOTAL, FREET4, T3FREE, THYROIDAB in the last 72 hours. Anemia Panel: No results for input(s): VITAMINB12, FOLATE, FERRITIN, TIBC, IRON, RETICCTPCT in the last 72 hours. Sepsis Labs: Recent Labs  Lab 06/20/20 2036 06/21/20 0558 06/21/20 1746  PROCALCITON 2.05 4.27 5.59  LATICACIDVEN 1.1  --   --     Recent Results (from the past 240 hour(s))  Blood culture (routine single)     Status: None   Collection Time: 06/18/20  3:08 PM   Specimen: BLOOD  Result Value Ref Range Status   Specimen Description BLOOD BLOOD LEFT ARM  Final   Special Requests   Final    BOTTLES DRAWN AEROBIC AND ANAEROBIC Blood Culture results may not be optimal due to an inadequate volume of blood received in culture bottles   Culture   Final    NO GROWTH 5 DAYS Performed at [facility], [address] [place], [place] [address]    Report Status 06/23/2020 FINAL  Final  SARS Coronavirus 2 by RT PCR (hospital order, performed in [place] hospital lab) Nasopharyngeal Nasopharyngeal Swab     Status: None   Collection Time: 06/18/20  3:08 PM   Specimen: Nasopharyngeal Swab  Result Value Ref Range Status   SARS Coronavirus 2 NEGATIVE NEGATIVE Final    Comment: (NOTE) SARS-CoV-2 target nucleic acids are NOT DETECTED.  The SARS-CoV-2 RNA is generally detectable in upper and lower respiratory specimens during the  acute phase of infection. The lowest concentration of SARS-CoV-2 viral copies this assay can detect is 250 copies / mL. A negative result does not preclude SARS-CoV-2 infection and should not be used as the sole basis for treatment or other patient management decisions.  A negative result may occur with improper specimen collection / handling, submission of specimen other than nasopharyngeal swab, presence of viral mutation(s) within the areas targeted by this assay, and inadequate number of viral copies (<250 copies / mL). A negative result must be combined with clinical observations, patient history, and epidemiological information.  Fact Sheet for Patients:   StrictlyIdeas.no  Fact Sheet for Healthcare Providers: BankingDealers.co.za  This test is not yet approved or  cleared by the [place] FDA and has been authorized for detection and/or diagnosis of SARS-CoV-2 by FDA under an Emergency Use Authorization (EUA).  This EUA will remain in effect (meaning this test can be used) for the duration of the COVID-19 declaration under Section 564(b)(1) of the Act, 21 U.S.C. section 360bbb-3(b)(1), unless the authorization is terminated or revoked sooner.  Performed at [facility], [address] [place], [place] [address]   MRSA PCR Screening     Status: None   Collection Time: 06/20/20  1:19 PM   Specimen: Nasal Mucosa; Nasopharyngeal  Result Value Ref Range Status   MRSA by PCR NEGATIVE NEGATIVE Final    Comment:        The GeneXpert MRSA Assay (FDA approved for NASAL specimens only), is one component of a comprehensive MRSA colonization surveillance program. It is not intended to diagnose MRSA infection nor to guide or monitor treatment for MRSA infections. Performed at [facility], [address] [place], [place] [address]   MRSA PCR Screening     Status: None   Collection Time: 06/21/20  3:55 PM    Specimen: Nasal Mucosa; Nasopharyngeal  Result Value Ref Range Status   MRSA by PCR NEGATIVE NEGATIVE Final    Comment:        The GeneXpert MRSA Assay (FDA approved for NASAL specimens only), is one component of a comprehensive MRSA colonization surveillance program. It is not intended to diagnose MRSA infection nor to guide or monitor treatment for MRSA infections. Performed at [facility], [place] [address] [place], [place] [address]   CSF culture     Status: None   Collection Time: 06/22/20  1:31 PM   Specimen: PATH Cytology CSF; Cerebrospinal Fluid  Result Value Ref Range Status   Specimen Description CSF  Final   Special Requests NONE  Final   Gram Stain   Final    WBC PRESENT,BOTH PMN AND MONONUCLEAR NO ORGANISMS SEEN CYTOSPIN SMEAR    Culture   Final    NO GROWTH 3 DAYS Performed at [facility], [place] [address], [place], [place] [address]    Report Status 06/25/2020 FINAL  Final  Culture, fungus without smear     Status: None (Preliminary result)   Collection Time: 06/22/20  1:31 PM   Specimen: PATH Cytology CSF; Cerebrospinal Fluid  Result Value Ref Range Status   Specimen Description CSF  Final   Special Requests NONE  Final   Culture   Final    NO FUNGUS ISOLATED AFTER 3 DAYS Performed at [facility], [place] [address] [place], [place] [address]    Report Status PENDING  Incomplete  Radiology Studies: No results found.      Scheduled Meds: . aspirin EC  81 mg Oral Daily  . carvedilol  25 mg Per Tube BID WC  . chlorhexidine  15 mL Mouth Rinse BID  . Chlorhexidine Gluconate Cloth  6 each Topical Q0600  . clopidogrel  75 mg Oral Daily  . [START ON 06/28/2020] darbepoetin (ARANESP) injection - DIALYSIS  60 mcg Intravenous Q Sat-HD  . famotidine  20 mg Per Tube QHS  . heparin injection (subcutaneous)  5,000 Units Subcutaneous Q8H  . insulin aspart  0-9 Units Subcutaneous Q4H  . mouth rinse  15 mL Mouth Rinse q12n4p    Continuous Infusions: . sodium chloride    . acyclovir 390 mg (06/26/20 0552)  . feeding supplement (PIVOT 1.5 CAL) 1,000 mL (06/26/20 1203)  . levETIRAcetam 1,000 mg (06/26/20 0014)  . vancomycin       LOS: 5 days    Time spent: 35 minutes     Barb Merino, MD [facility] Pager [phone]

## 2020-06-26 NOTE — Progress Notes (Signed)
Pt bladder scanned, 706 mL reflected on scan. In and out performed per orders, 700 mL removed. Re-scan showed no residual.    Pt tolerated procedure well.

## 2020-06-26 NOTE — Evaluation (Addendum)
Occupational Therapy Evaluation Patient Details Name: Adrian Evans MRN: [id number] DOB: 1986-03-18 Today's Date: 06/26/2020    History of Present Illness 35 yo M admitted to [facility] 1/26 w n/v malaise, weakness. Hypotensive in ED and volume resuscitated. Worse AMS without clear etiology, transferring to [facility] 1/29 for LP and cEEG. MRI revealed multiple scattered punctate acute ishcemic infarcts involving both cerebral hemisphers, R greater than L and a small remote L PCA territory infarct, with additional small remote L thalamic and R cerebellar infarcts. Pt extubated 2/1.   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 Patient currently functioning below baseline with deficits including dysarthria, ataxia, decreased sensation, decreased strength, possible R visual field deficits, and need for Max A to Total A +2 for grooming tasks, UN/LB ADLs and sit to stand tranfers. Patient with attempts to verbalize but is difficult to understand 2/2 aphasia. Recommendation for intensive CIR. OT will continue to follow acutely.     Follow Up Recommendations  CIR;Supervision/Assistance - 24 hour    Equipment Recommendations  Other (comment) (TBD)    Recommendations for Other Services Rehab consult     Precautions / Restrictions Precautions Precautions: Fall Precaution Comments: R visual field deficits?, dysarthric, easily distracted, poor frustration tolerance, ataxic Restrictions Weight Bearing Restrictions: No      Mobility Bed Mobility Overal bed mobility: Needs Assistance Bed Mobility: Sit to Supine;Supine to Sit     Supine to sit: Max assist;+2 for physical assistance;+2 for safety/equipment;HOB elevated Sit to supine: +2 for physical assistance;+2 for safety/equipment;Total assist   General bed mobility comments: Requires Max A to Total A +2 for all parts of bed mobility.    Transfers Overall transfer  level: Needs assistance Equipment used: 2 person hand held assist (Face to face transfer) Transfers: Sit to/from Stand Sit to Stand: Total assist;+2 physical assistance;+2 safety/equipment;From elevated surface         General transfer comment: Sit to stand 1x from elevated EOB with bil knee block and use of pad as sling under buttocks to assist. Patient does not attempt to assist.    Balance Overall balance assessment: Needs assistance Sitting-balance support: Feet supported;No upper extremity supported Sitting balance-Leahy Scale: Poor Sitting balance - Comments: Mod A with brief periods of Min to Min guard to maintain static sitting balance at EOB. Mirror used for Warden/ranger. Postural control: Posterior lean Standing balance support: Bilateral upper extremity supported Standing balance-Leahy Scale: Zero Standing balance comment: Total A +2 with bilateral knee block. Patient does not attempt to assist.                           ADL either performed or assessed with clinical judgement   ADL Overall ADL's : Needs assistance/impaired     Grooming: Maximal assistance;Bed level               Lower Body Dressing: Total assistance;Bed level Lower Body Dressing Details (indicate cue type and reason): To don footwear.     Toileting- Clothing Manipulation and Hygiene: Total assistance Toileting - Clothing Manipulation Details (indicate cue type and reason): Foley and rectal tube.     Functional mobility during ADLs: Total assistance;+2 for physical assistance;+2 for safety/equipment General ADL Comments: Total A +2 for sit to stand from EOB.     Vision Baseline Vision/History: No visual deficits Patient Visual Report: Other (comment) (Patient unable to  state) Vision Assessment?: Vision impaired- to be further tested in functional context Additional Comments: Difficulty tracking to R requiring increased time and multimodal cues for tracking beyond midline to  R.     Perception     Praxis      Pertinent Vitals/Pain Pain Assessment: Faces Faces Pain Scale: Hurts a little bit Pain Location: generalized with noxious stimuli Pain Descriptors / Indicators: Grimacing Pain Intervention(s): Limited activity within patient's tolerance;Monitored during session;Repositioned     Hand Dominance Right   Extremity/Trunk Assessment Upper Extremity Assessment Upper Extremity Assessment: RUE deficits/detail;LUE deficits/detail RUE Deficits / Details: grossly 3-/5 except wrist extension 2/5, grip 3+/5 RUE Sensation: decreased light touch RUE Coordination: decreased fine motor;decreased gross motor LUE Deficits / Details: grossly 3-/5 except wrist extension 2/5, grip 3-/5 LUE Sensation: decreased light touch (no withdraw or grimace to painful stimuli) LUE Coordination: decreased fine motor;decreased gross motor   Lower Extremity Assessment Lower Extremity Assessment: Defer to PT evaluation   Cervical / Trunk Assessment Cervical / Trunk Assessment: Other exceptions Cervical / Trunk Exceptions: limited active cervical ROM. Able to maintain position of head against gravity.   Communication Communication Communication: Receptive difficulties;Expressive difficulties   Cognition Arousal/Alertness: Lethargic (Initially) Behavior During Therapy: Flat affect Overall Cognitive Status: Impaired/Different from baseline Area of Impairment: Orientation;Attention;Following commands;Safety/judgement;Problem solving                 Orientation Level: Disoriented to;Time;Situation Current Attention Level: Focused   Following Commands: Follows one step commands with increased time;Follows one step commands inconsistently (follows commands approx 60% of time) Safety/Judgement: Decreased awareness of safety;Decreased awareness of deficits Awareness: Intellectual Problem Solving: Slow processing;Decreased initiation;Difficulty sequencing;Requires verbal  cues;Requires tactile cues General Comments: Lethargic. Easily distracted. Requires multimodal cues to follow verbal commands. Noted increased command following on L side but can follow command on R with decreased consistency.   General Comments  BUE edema noted. Family education on AAROM and PROM to facilitate soft tissue elongation.    Exercises     Shoulder Instructions      Home Living Family/patient expects to be discharged to:: Private residence Living Arrangements: Spouse/significant other Available Help at Discharge: Family;Available 24 hours/day Type of Home: House Home Access: Level entry     Home Layout: Two level;Able to live on main level with bedroom/bathroom Alternate Level Stairs-Number of Steps: flight             Home Equipment: None   Additional Comments: info provided by wife [name]  Lives With: Spouse (2 kids 74 and 10yo)    Prior Functioning/Environment Level of Independence: Independent        Comments: pt is a Careers adviser, and an Psychologist, occupational        OT Problem List: Decreased strength;Decreased activity tolerance;Impaired balance (sitting and/or standing);Impaired vision/perception;Decreased coordination;Decreased cognition;Decreased safety awareness;Decreased knowledge of use of DME or AE;Impaired sensation;Impaired tone;Impaired UE functional use;Increased edema      OT Treatment/Interventions: Self-care/ADL training;Therapeutic exercise;Neuromuscular education;Energy conservation;DME and/or AE instruction;Therapeutic activities;Visual/perceptual remediation/compensation;Patient/family education;Balance training;Cognitive remediation/compensation    OT Goals(Current goals can be found in the care plan section) Acute Rehab OT Goals Patient Stated Goal: Unable to state. OT Goal Formulation: With patient Time For Goal Achievement: 07/10/20 Potential to Achieve Goals: Good ADL Goals Pt Will Perform Grooming: with min  assist;sitting Additional ADL Goal #1: Patient will maintain unsupported static sitting balance at EOB with Min guard in prep for ADLs and functional transfers. Additional ADL Goal #2: Patient will demonstrate sit to stand transfers  with Mod A +2 and LRAD in prep for ADLs and functional transfers. Additional ADL Goal #3: Patient will attend to 1 grooming task in sitting with no more than 2 verbal cues for attention to task.  OT Frequency: Min 2X/week   Barriers to D/C:            Co-evaluation   Reason for Co-Treatment: Necessary to address cognition/behavior during functional activity;For patient/therapist safety;To address functional/ADL transfers PT goals addressed during session: Balance;Mobility/safety with mobility        AM-PAC OT "6 Clicks" Daily Activity     Outcome Measure Help from another person eating meals?: Total ([place]) Help from another person taking care of personal grooming?: A Lot Help from another person toileting, which includes using toliet, bedpan, or urinal?: Total Help from another person bathing (including washing, rinsing, drying)?: Total Help from another person to put on and taking off regular upper body clothing?: Total Help from another person to put on and taking off regular lower body clothing?: Total 6 Click Score: 7   End of Session Equipment Utilized During Treatment: Gait belt Nurse Communication: Mobility status  Activity Tolerance: Patient tolerated treatment well Patient left: in bed;with call bell/phone within reach;with bed alarm set;with family/visitor present  OT Visit Diagnosis: Unsteadiness on feet (R26.81);Other abnormalities of gait and mobility (R26.89);Muscle weakness (generalized) (M62.81);Ataxia, unspecified (R27.0);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 OTR/L Supplemental OT, Department of rehab services [phone]  Zamia Tyminski R H. 06/26/2020, 3:50 PM

## 2020-06-26 NOTE — Progress Notes (Signed)
OT Cancellation Note  Patient Details Name: Adrian Evans MRN: [id number] DOB: Mar 28, 1986   Cancelled Treatment:    Reason Eval/Treat Not Completed: Patient at procedure or test/ unavailable; off floor for HD. OT will continue efforts toward completion of evaluation when patient is available.   Gloris Manchester OTR/L Supplemental OT, Department of rehab services [phone]  Haniah Penny R H. 06/26/2020, 8:18 AM

## 2020-06-26 NOTE — Progress Notes (Signed)
Inpatient Rehabilitation Admissions Coordinator  I met with pt's wife at bedside. Patient was asleep. We continue to discuss a possible CIR admit once patient is medically ready for D/C. I have discussed with Terri Piedra concerning arrangements for outpatient hemodialysis follow up. I await beginning [employer] approval when close to medical readiness for D/C.  Danne Baxter, RN, MSN Rehab Admissions Coordinator [phone] 06/26/2020 1:13 PM

## 2020-06-26 NOTE — Progress Notes (Signed)
Nutrition Follow-up  DOCUMENTATION CODES:   Not applicable  INTERVENTION:   -d/c current tube feed regimen   -initiate Osmolite 1.5 via Cortrak NG at 40 mL/hr, increase by 10 mL every 4 hours to goal rate of 65 mL/hr  -90 mL of PROSource TF BID  This TF regimen provides 2500 kcal, 142 g protein, 1188 mL of free water.  NUTRITION DIAGNOSIS:   Inadequate oral intake related to inability to eat as evidenced by NPO status.  Ongoing.  GOAL:   Patient will meet greater than or equal to 90% of their needs  Ongoing.  MONITOR:   TF tolerance,Vent status,Labs  REASON FOR ASSESSMENT:   Consult,Ventilator Enteral/tube feeding initiation and management  ASSESSMENT:   Pt with PMH of type 1 DM, CHF, CKD stage 3b, diabetic retinopathy, and HTN who was admitted with N/V, AKI, and, acute encephalopathy.  Pt's wife discussed with intern that pt has been tolerating tube feeds well, however he has had some diarrhea. Pt's wife denied that pt had been experiencing any n/v or abdominal pain. Intern discussed with pt's wife that we would be adjusting tube feeding formulations to better suit his needs now that he is off of a ventilator.   Pt is currently on intermittent hemodialysis.   Pt's wife discussed with intern that pt's UBW was 220#. Weights were reviewed but d/t the large weight fluctuations, weights may not be accurate. These fluctuations may be related to fluid retention.  Meds Reviewed: Pepcid (20 mg, daily), Pivot 1.5 (50 mL/hr)  Labs Reviewed: glucose (173 mg/dL), CO2 (18 mmol/L), BUN (44 mg/dL), Creatinine (5.88 mg/dL), Calcium (8.4 mg/dL), Phosphorus (5.2 mg/dL), Magnesium (2.5 mg/dL), GFR (12 mL/min)  Diet Order:   Diet Order            Diet NPO time specified  Diet effective now                 EDUCATION NEEDS:   No education needs have been identified at this time  Skin:  Skin Assessment: Skin Integrity Issues: Skin Integrity Issues:: Incisions Incisions:  (MASD) - right buttocks; medial  Last BM:  06/26/20 (type 7)  Height:   Ht Readings from Last 1 Encounters:  06/18/20 6' (1.829 m)    Weight:   Wt Readings from Last 1 Encounters:  06/26/20 103.9 kg    Ideal Body Weight:  80.9 kg  BMI:  Body mass index is 31.07 kg/m.  Estimated Nutritional Needs:   Kcal:  2400-2600  Protein:  125-140 g  Fluid:  1 L plus UOP   Salvadore Oxford, Dietetic Intern 06/26/2020 4:51 PM

## 2020-06-26 NOTE — Plan of Care (Signed)
  Problem: Education: Goal: Expressions of having a comfortable level of knowledge regarding the disease process will increase Outcome: Not Progressing   Problem: Coping: Goal: Ability to adjust to condition or change in health will improve Outcome: Not Progressing Goal: Ability to identify appropriate support needs will improve Outcome: Not Progressing   Problem: Health Behavior/Discharge Planning: Goal: Compliance with prescribed medication regimen will improve Outcome: Not Progressing   Problem: Medication: Goal: Risk for medication side effects will decrease Outcome: Not Progressing   Problem: Clinical Measurements: Goal: Complications related to the disease process, condition or treatment will be avoided or minimized Outcome: Not Progressing Goal: Diagnostic test results will improve Outcome: Not Progressing   Problem: Safety: Goal: Verbalization of understanding the information provided will improve Outcome: Not Progressing   Problem: Self-Concept: Goal: Level of anxiety will decrease Outcome: Not Progressing Goal: Ability to verbalize feelings about condition will improve Outcome: Not Progressing   Problem: Education: Goal: Knowledge of General Education information will improve Description: Including pain rating scale, medication(s)/side effects and non-pharmacologic comfort measures Outcome: Not Progressing   Problem: Health Behavior/Discharge Planning: Goal: Ability to manage health-related needs will improve Outcome: Not Progressing   Problem: Clinical Measurements: Goal: Ability to maintain clinical measurements within normal limits will improve Outcome: Not Progressing Goal: Will remain free from infection Outcome: Not Progressing Goal: Diagnostic test results will improve Outcome: Not Progressing Goal: Respiratory complications will improve Outcome: Not Progressing Goal: Cardiovascular complication will be avoided Outcome: Not Progressing

## 2020-06-26 NOTE — Progress Notes (Signed)
  Speech Language Pathology Treatment: Cognitive-Linquistic  Patient Details Name: Adrian Evans MRN: [id number] DOB: [birth year] Today's Date: 06/26/2020 Time: [id number] SLP Time Calculation (min) (ACUTE ONLY): 11 min  Assessment / Plan / Recommendation Clinical Impression  Adrian Evans was sleepy after HD this morning with wife at bedside. Eyes were closed, moving legs and did not appear to be fully asleep. With tactile assist he opened his eyes for 30-60 second intervals and was not safely alert for food or liquid boluses.  Further diagnostic treatment for pt's expressive language and motor speech attempted. He responded to two biographical questions stating his name and daughter's with altered resonance (hyponasal?). Information of unfamiliar context was unintelligible. No response when asked his wife's or son's name perhaps due to lethargy (eyes were closed). Facial grimacing appeared due to motor dysfunction versus pain. Intervention to continue to facilitate swallow, communication and cognition.    HPI HPI: Adrian Evans is a 35 y.o. male admitted with generalized weakness, malaise, nausea vomiting, nasal congestion/sneezing/cough. Found to be in shock, concern for sepis, acute renal failure and acute stroke. Intubated 1/29-2/1 after becoming difficult to arouse. MRI multiple scattered punctate acute to early subacute ischemic white matter infarcts involving the periventricular and deep white matter of both cerebral hemispheres, right greater than left. 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 PMH: type 1 diabetes, EF 20 to 25%, CKD stage IIIb, hypertension.  Chest x-ray showed severe cardiomegaly, pulmonary venous congestion, mild bilateral interstitial prominence (mild CHF versus pneumonitis cannot be excluded).      SLP Plan  Continue with current plan of care       Recommendations                   Oral  Care Recommendations: Oral care QID Follow up Recommendations: Inpatient Rehab SLP Visit Diagnosis: Dysarthria and anarthria (R47.1);Cognitive communication deficit [id number]) Plan: Continue with current plan of care       Adrian Evans 06/26/2020, 1:31 PM

## 2020-06-26 NOTE — Progress Notes (Signed)
PT Cancellation Note  Patient Details Name: Adrian Evans MRN: [id number] DOB: Oct 06, 1985   Cancelled Treatment:    Reason Eval/Treat Not Completed: Patient at procedure or test/unavailable. Pt off floor at HD treatment. PT will follow-up as able.  Moishe Spice, PT, DPT Acute Rehabilitation Services  Pager: [phone] Office: [place] 06/26/2020, 8:16 AM

## 2020-06-26 NOTE — Progress Notes (Addendum)
Pharmacy Antibiotic Note  Adrian Evans is a 35 y.o. male admitted on 06/21/2020 with meningitis. Pharmacy has been consulted for acyclovir and vancomycin dosing. Patient is also on ceftriaxone and ampicillin.  CRRT stopped 2/1 and transitioned to iHD, first session this am.  Plan: Vancomycin 1 gm IV planned after each HD session. Ordered x 1 and given this am. Will f/u HD schedule and order subsequent doses with HD.  Acyclovir '5mg'$ /kg per IBW Q24 hours while on HD  Ceftriaxone 2g Q12hr per MD  Ampicillin 2g q12hr on iHD Monitor cultures, clinical status, renal fxn Narrow abx as able and f/u duration    Weight: 103.9 kg (229 lb 0.9 oz)  Temp (24hrs), Avg:98.1 F (36.7 C), Min:97.4 F (36.3 C), Max:98.7 F (37.1 C)  Recent Labs  Lab 06/20/20 2036 06/21/20 0558 06/22/20 1554 06/23/20 0337 06/23/20 1549 06/24/20 1126 06/25/20 0537 06/25/20 1920 06/25/20 2000 06/26/20 0335  WBC  --    < >  --  10.2  --  10.3 11.0* 10.5  --  10.9*  CREATININE 13.83*   < > 8.61* 6.05* 4.95* 3.44* 4.76*  --  5.60* 5.88*  LATICACIDVEN 1.1  --   --   --   --   --   --   --   --   --   VANCORANDOM  --   --  22  --   --   --   --   --   --   --    < > = values in this interval not displayed.      No Known Allergies  Antimicrobials this admission: Ceftriaxone 1/28 >>  Vancomycin 1/28 >>  Ampicillin 1/28 >> Acyclovir 1/29 >>   Microbiology results: 1/29 COVID neg 1/30 Strep pneumo>>negative 1/30 CSF counts > glucose wnl, protein high, WBC neg  1/30 HSV PCR >>  1/30 CSF fungal smear >> no fungus x 3 days to date 1/30 CSF cx: negative 1/30 Varicella IgG >> positive 1/30 Varicella PCR >> pending  Arty Baumgartner, [place] 06/26/2020 1:08 PM

## 2020-06-26 NOTE — Progress Notes (Signed)
[facility] Progress Note Patient Name: Adrian Evans DOB: Sep 28, 1985 MRN: [id number]   Date of Service  06/26/2020  HPI/Events of Note  Patient with urinary retention.  eICU Interventions  Foley catheter orders placed.        Frederik Pear 06/26/2020, 4:43 AM

## 2020-06-26 NOTE — Progress Notes (Addendum)
[facility] Progress Note    Assessment/ Plan:   1. AKI on CKD 4 a. Baseline creatinine was 3.2 in October 2021.  EGFR of 27 mL/minute.  Underlying CKD is most likely due to T1DM.  Patient presented to the [facility] emergency department on 1/26 with SBP's in the 32s.  Renal ultrasound - no evidence of obstruction.  Patient received emergent dialysis at [facility] was transferred to [facility] for neuro support.  He was intubated on arrival for airway protection and started on CRRT from 1/29-2/1.  Creatinine increasing to 5.88 from 5.6 yesterday.  Sodium and potassium are within normal limits and stable.  Albumin is 1.8, magnesium mildly elevated 2.5, phosphorus elevated at 5.2.  Will plan for IHD today and tentatively continue on TTS dialysis schedule. b. Patient had urinary retention overnight and a Foley catheter was placed with approximately 1 L of urine drained c. Patient will receive dialysis today with no volume removal and we will continue to monitor urinary output d. Daily renal panels e. Avoid nephrotoxic agents f. If still HD dep next week ( I suspect will be , then will consult vascular for AVF and converting cath to tunneled-  Order vein map )  2. Hypotension a. Most likely due to cardiac versus hypovolemic shock.  Has improved over the last 24 hours with blood pressures 118/91-147/95 b. Current medications include Coreg 12.5 mg twice daily, given hypertension will increase to 25 mg twice daily 3. VDRF a. Resolved, sedation discontinued on 2/1 and patient was extubated.   4. Acute encephalopathy a. Multifactorial in origin.  Uremia may be contributing but patient also has multiple scattered punctuate cerebellar infarcts on MRI.  EEG showed no seizure-like activity consistent with encephalopathy. b. Continue management per primary team 5. T1DM-being managed by primary team, longstanding 6. HFrEF with an EF of 20-25% 7. Anemia-hemoglobin stable at 9.2 this morning,  has not required treatment yet. a. Daily CBC b. Initiating ESA on Saturdays- iron appears replete 8. Bones-  PTH 170- phos 5.2- no binder   Patient seen and examined, agree with above note with above modifications. A on severe CKD at baseline-  Suspect will be ESRD from this point but making urine so will watch.  HD on TTS schedule- still pretty ill-  Want to give the weekend before calling vascular for perm access and starting CLIP-  Suspect he might need to go to a facility  Corliss Parish, MD 06/26/2020       Subjective:   No acute events overnight.  Patient was evaluated by physical medicine rehabilitation physicians yesterday for possible CIR.  He must be medically stable and able to tolerate 3 hours of physical therapy per day before patient can be transferred.  Was resting in HD-  Because easily agitated, did not wake-  Wife says is following commands but having difficult time with speech-  About the same as yesterday    Objective:   BP (!) 151/91 (BP Location: Left Arm)   Pulse 99   Temp 98.3 F (36.8 C) (Axillary)   Resp 18   Wt 103.9 kg   SpO2 98%   BMI 31.07 kg/m   Intake/Output Summary (Last 24 hours) at 06/26/2020 1115 Last data filed at 06/26/2020 1005 Gross per 24 hour  Intake 413.33 ml  Output 696 ml  Net -282.67 ml   Weight change:   Physical Exam: Gen: In dialysis on evaluation, sleeping CVS: Regular rate in the 90s with regular rhythm, no murmurs  noted Resp: Normal work of breathing, lungs clear to auscultation bilaterally Abd: Soft, nontender, positive bowel sounds Ext: No edema noted Dialysis Access: Patient has temporary HD cath in for 5 days at this time  Imaging: No results found.  Labs: BMET Recent Labs  Lab 06/22/20 1554 06/23/20 0337 06/23/20 1549 06/24/20 1126 06/25/20 0537 06/25/20 2000 06/26/20 0335  NA 137 138 136 135 135 137 136  K 3.2* 3.9 3.8 3.8 3.8 3.9 4.6  CL 107 108 102 104 101 102 103  CO2 16* 16* 20* 23 22 22  18*   GLUCOSE 99 105* 124* 165* 133* 190* 197*  BUN 58* 41* 31* 28* 33* 39* 44*  CREATININE 8.61* 6.05* 4.95* 3.44* 4.76* 5.60* 5.88*  CALCIUM 7.7* 7.7* 7.7* 7.7* 8.1* 8.4* 8.4*  PHOS 2.8 2.1* 2.1* 2.5 4.8* 5.3* 5.2*   CBC Recent Labs  Lab 06/23/20 0337 06/24/20 1126 06/25/20 0537 06/25/20 1920 06/26/20 0335  WBC 10.2 10.3 11.0* 10.5 10.9*  NEUTROABS 8.4* 9.2* 7.9*  --  8.0*  HGB 10.7* 10.1* 9.2* 9.1* 9.2*  HCT 30.2* 29.0* 27.1* 26.6* 27.0*  MCV 89.1 89.8 92.8 91.7 94.7  PLT 331 325 317 312 286    Medications:    . carvedilol  25 mg Per Tube BID WC  . chlorhexidine  15 mL Mouth Rinse BID  . Chlorhexidine Gluconate Cloth  6 each Topical Q0600  . [START ON 06/28/2020] darbepoetin (ARANESP) injection - DIALYSIS  60 mcg Intravenous Q Sat-HD  . famotidine  20 mg Per Tube QHS  . heparin injection (subcutaneous)  5,000 Units Subcutaneous Q8H  . insulin aspart  0-9 Units Subcutaneous Q4H  . lidocaine (PF)  5 mL Intradermal Once  . mouth rinse  15 mL Mouth Rinse q12n4p  . vancomycin variable dose per unstable renal function (pharmacist dosing)   Does not apply See admin instructions     Gifford Shave, MD  [place] Resident, PGY2 06/26/2020, 11:15 AM

## 2020-06-27 ENCOUNTER — Inpatient Hospital Stay (HOSPITAL_COMMUNITY): Payer: BC Managed Care – PPO

## 2020-06-27 DIAGNOSIS — N19 Unspecified kidney failure: Secondary | ICD-10-CM

## 2020-06-27 LAB — CBC WITH DIFFERENTIAL/PLATELET
Abs Immature Granulocytes: 0.35 10*3/uL — ABNORMAL HIGH (ref 0.00–0.07)
Basophils Absolute: 0.1 10*3/uL (ref 0.0–0.1)
Basophils Relative: 1 %
Eosinophils Absolute: 0.3 10*3/uL (ref 0.0–0.5)
Eosinophils Relative: 3 %
HCT: 24.5 % — ABNORMAL LOW (ref 39.0–52.0)
Hemoglobin: 8.5 g/dL — ABNORMAL LOW (ref 13.0–17.0)
Immature Granulocytes: 3 %
Lymphocytes Relative: 11 %
Lymphs Abs: 1.2 10*3/uL (ref 0.7–4.0)
MCH: 32.1 pg (ref 26.0–34.0)
MCHC: 34.7 g/dL (ref 30.0–36.0)
MCV: 92.5 fL (ref 80.0–100.0)
Monocytes Absolute: 1.1 10*3/uL — ABNORMAL HIGH (ref 0.1–1.0)
Monocytes Relative: 10 %
Neutro Abs: 7.4 10*3/uL (ref 1.7–7.7)
Neutrophils Relative %: 72 %
Platelets: 290 10*3/uL (ref 150–400)
RBC: 2.65 MIL/uL — ABNORMAL LOW (ref 4.22–5.81)
RDW: 13.8 % (ref 11.5–15.5)
WBC: 10.3 10*3/uL (ref 4.0–10.5)
nRBC: 0 % (ref 0.0–0.2)

## 2020-06-27 LAB — RENAL FUNCTION PANEL
Albumin: 1.7 g/dL — ABNORMAL LOW (ref 3.5–5.0)
Anion gap: 12 (ref 5–15)
BUN: 33 mg/dL — ABNORMAL HIGH (ref 6–20)
CO2: 24 mmol/L (ref 22–32)
Calcium: 8.2 mg/dL — ABNORMAL LOW (ref 8.9–10.3)
Chloride: 102 mmol/L (ref 98–111)
Creatinine, Ser: 4.86 mg/dL — ABNORMAL HIGH (ref 0.61–1.24)
GFR, Estimated: 15 mL/min — ABNORMAL LOW (ref >60)
Glucose, Bld: 230 mg/dL — ABNORMAL HIGH (ref 70–99)
Phosphorus: 3.9 mg/dL (ref 2.5–4.6)
Potassium: 3.6 mmol/L (ref 3.5–5.1)
Sodium: 138 mmol/L (ref 135–145)

## 2020-06-27 LAB — GLUCOSE, CAPILLARY
Glucose-Capillary: 134 mg/dL — ABNORMAL HIGH (ref 70–99)
Glucose-Capillary: 149 mg/dL — ABNORMAL HIGH (ref 70–99)
Glucose-Capillary: 158 mg/dL — ABNORMAL HIGH (ref 70–99)
Glucose-Capillary: 170 mg/dL — ABNORMAL HIGH (ref 70–99)
Glucose-Capillary: 185 mg/dL — ABNORMAL HIGH (ref 70–99)
Glucose-Capillary: 189 mg/dL — ABNORMAL HIGH (ref 70–99)
Glucose-Capillary: 194 mg/dL — ABNORMAL HIGH (ref 70–99)

## 2020-06-27 LAB — MAGNESIUM: Magnesium: 2.3 mg/dL (ref 1.7–2.4)

## 2020-06-27 NOTE — Progress Notes (Signed)
14 Fr. Foley inserted per order with NT assistance using the proper sterile technique. Pt tolerated well, urine return noted, no bleeding, resistance or pain noted, balloon filled with 10 cc saline. Procedure charted accordingly, see LDA for more details.

## 2020-06-27 NOTE — Progress Notes (Signed)
PROGRESS NOTE    Adrian Evans  [id number] DOB: 1986-03-13 DOA: 06/21/2020 PCP: Lenard Simmer, MD    Brief Narrative:  Patient is 35 year old gentleman who has history of type 1 diabetes on insulin, diabetic retinopathy, hypertension, stage IIIb chronic kidney disease who presented to [facility] on 1/26 with nausea, vomiting, malaise and weakness.  Patient went to work in the morning, he did not feel well so taken to the emergency room.  Wife reports that he had poor appetite and felt body ache for the last few days. Significant events: 1/26 admitted at [facility] in shock felt to be hypovolemic  1/27 cards consult, nephrology consult. Surgery and vascular consults for HD access 1/28 hypotensive and transferred to ICU. AMS, sz-like episodes During tele neuro eval. Rec HD 1/29 Keppra, meningitis coverage, [facility]G and LP 1/30 LP performed by IR, MRI showed multiple cerebral infarcts1/26 admitted, in shock felt to be hypovolemic   1/28 CT H no acute intracranial abnormality -- ill defined low density focus in L thalamus 1/28 CTA h/n> no LVO   1/29 MRI Brain 1. Multiple scattered punctate acute to early subacute ischemic white matter infarcts involving the periventricular and deep white matter of both cerebral hemispheres, right greater than left. No associated hemorrhage or mass effect. 2. 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   Assessment & Plan:   Active Problems:   Acute encephalopathy   Acute cerebral infarction ([place])   Diabetes mellitus type I ([place])   Stage 3 chronic kidney disease ([place])   Essential hypertension   Chronic systolic congestive heart failure (HCC)   Dysphagia, post-stroke  Acute encephalopathy, multifactorial Suspected secondary to uremia, multiple stroke. His mental status remains poor but slowly improving. Patient will need aggressive rehab.  Suspected  meningitis: Less likely. He did present with viral syndrome. Negative for bacterial meningitis. Negative for autoimmune meningitis.  QuantiFERON and HIV negative.  Hepatitis panel negative.  Covid negative.  Streptococcus pneumoniae antigen negative.  Autoimmune work-up negative. HSV pending. Varicella-zoster PCR negative. Discontinue all antibiotics, this is likely viral syndrome. Continue Acyclovir until HSV reported.  Seizure disorder: Reported seizure like episodes more likely uremic symptoms.  Seen by neurology.  Currently remains on Keppra. 3 days video EEG did not demonstrate any seizures.  Shock: Suspect hypovolemic shock.  Transiently on vasopressors.  Now off vasopressors.  Acute stroke: Acute multiple territory stroke thought to be secondary to hypotension and hypoperfusion. Encephalopathic, no focal deficit.  Aggressive rehab. Refer to CIR. Aspirin and plavix for 3 weeks and aspirin alone as suggested by neurology. LDL 44, no statin needed  A1c 7.4, on insulin.  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  Received dialysis 2/3, nephrology following. Has right IJ temporary catheter. Patient had more than 700 mL urinary retention overnight, will insert Foley catheter to decompress bladder as well to help with obstruction.  May improve his renal functions.  Acute hypoxemic respiratory failure secondary to altered mental status: Extubated to nasal cannula oxygen on 2/1. Now on room air.  Acute on chronic combined heart failure: Previous EF 45%.  TEE with ejection fraction of 20 to 25%. Suspect acute cardiomyopathy stress. Started on coreg. Will not tolerate ACE or ARB Will repeat limited echocardiogram after clinical improvement. Avoiding diuretics. Fluid balance achieved by dialysis.  Type 1 diabetes: Currently only on sliding scale  insulin.  Diet: Tube feeding, n.p.o. due to mental status    DVT prophylaxis: heparin injection 5,000 Units Start: 06/24/20 1400 SCDs Start: 06/21/20 1551   Code Status: Full code Family Communication: Wife at the bedside Disposition Plan: Status is: Inpatient  Remains inpatient appropriate because:Persistent severe electrolyte disturbances, Altered mental status and Inpatient level of care appropriate due to severity of illness   Dispo: The patient is from: Home              Anticipated d/c is to: CIR              Anticipated d/c date is: 3 days              Patient currently is not medically stable to d/c.   Difficult to place patient No         Consultants:   PCCM  Neurology  Surgery  Procedures:   Multiple procedures.  Now on hemodialysis.  Antimicrobials:  1/29 acyclovir>  1/29 ampicillin> 2/3 1/29 rocephin> 2/3 1/29 vanc> 2/3   Subjective: Patient seen and examined.  Overnight events noted.  700 mL drained from the straight cath. Patient was sleepy, he did not respond to stimuli. Wife reported that he looked at her and mumbled something.  Objective: Vitals:   06/27/20 0006 06/27/20 0426 06/27/20 0828 06/27/20 1310  BP: (!) 149/89 (!) 147/87 (!) 145/89 140/87  Pulse: 81 83 91 74  Resp: '19 18 18 18  '$ Temp: 97.8 F (36.6 C) 97.8 F (36.6 C) 98.2 F (36.8 C) 97.8 F (36.6 C)  TempSrc: Oral Oral Oral Oral  SpO2: 96% 94% 99% 97%  Weight:        Intake/Output Summary (Last 24 hours) at 06/27/2020 1317 Last data filed at 06/26/2020 2000 Gross per 24 hour  Intake -  Output 1400 ml  Net -1400 ml   Filed Weights   06/25/20 0500 06/26/20 0730 06/26/20 1005  Weight: 96 kg 103.9 kg 103.9 kg    Examination:  General exam: Appears calm , comfortable but lethargic. Frail and debilitated. Respiratory system: Clear to auscultation. Respiratory effort normal. No added sounds. Cardiovascular system: S1 & S2 heard, RRR. No JVD, murmurs, rubs, gallops  or clicks. No pedal edema. Gastrointestinal system: soft and non tender. BS present. Tube feeding infusing. Central nervous system: Alert but not able to ask orientation questions. Mumbles answers and mostly appropriate. Extremities: generalized weakness. Rectal tube with loose dark stool.     Data Reviewed: I have personally reviewed following labs and imaging studies  CBC: Recent Labs  Lab 06/23/20 0337 06/24/20 1126 06/25/20 0537 06/25/20 1920 06/26/20 0335 06/27/20 0500  WBC 10.2 10.3 11.0* 10.5 10.9* 10.3  NEUTROABS 8.4* 9.2* 7.9*  --  8.0* 7.4  HGB 10.7* 10.1* 9.2* 9.1* 9.2* 8.5*  HCT 30.2* 29.0* 27.1* 26.6* 27.0* 24.5*  MCV 89.1 89.8 92.8 91.7 94.7 92.5  PLT 331 325 317 312 286 [id number]   Basic Metabolic Panel: Recent Labs  Lab 06/23/20 0337 06/23/20 1549 06/24/20 1126 06/25/20 0537 06/25/20 2000 06/26/20 0335 06/27/20 0500  NA 138   < > 135 135 137 136 138  K 3.9   < > 3.8 3.8 3.9 4.6 3.6  CL 108   < > 104 101 102 103 102  CO2 16*   < > '23 22 22 '$ 18* 24  GLUCOSE 105*   < > 165* 133* 190* 197* 230*  BUN 41*   < > 28* 33* 39* 44* 33*  CREATININE  6.05*   < > 3.44* 4.76* 5.60* 5.88* 4.86*  CALCIUM 7.7*   < > 7.7* 8.1* 8.4* 8.4* 8.2*  MG 2.2  --  2.4 2.4  --  2.5* 2.3  PHOS 2.1*   < > 2.5 4.8* 5.3* 5.2* 3.9   < > = values in this interval not displayed.   GFR: Estimated Creatinine Clearance: 25.9 mL/min (A) (by C-G formula based on SCr of 4.86 mg/dL (H)). Liver Function Tests: Recent Labs  Lab 06/20/20 2036 06/21/20 1746 06/22/20 0513 06/22/20 1554 06/24/20 1126 06/25/20 0537 06/25/20 2000 06/26/20 0335 06/27/20 0500  AST 10* 8* 9*  --   --   --   --   --   --   ALT '12 12 10  '$ --   --   --   --   --   --   ALKPHOS 69 73 75  --   --   --   --   --   --   BILITOT 2.0* 1.8* 2.0*  --   --   --   --   --   --   PROT 6.5 5.9* 6.0*  --   --   --   --   --   --   ALBUMIN 2.7* 2.3* 2.0*  2.0*   < > 2.0* 1.7* 1.7* 1.8* 1.7*   < > = values in this interval not  displayed.   Recent Labs  Lab 06/21/20 1746  LIPASE 22  AMYLASE 46   Recent Labs  Lab 06/20/20 2036  AMMONIA 25   Coagulation Profile: No results for input(s): INR, PROTIME in the last 168 hours. Cardiac Enzymes: Recent Labs  Lab 06/22/20 0513  CKTOTAL 198   BNP (last 3 results) No results for input(s): PROBNP in the last 8760 hours. HbA1C: No results for input(s): HGBA1C in the last 72 hours. CBG: Recent Labs  Lab 06/26/20 2024 06/27/20 0007 06/27/20 0426 06/27/20 0823 06/27/20 1311  GLUCAP 177* 170* 194* 189* 185*   Lipid Profile: Recent Labs    06/25/20 0537  TRIG 98   Thyroid Function Tests: No results for input(s): TSH, T4TOTAL, FREET4, T3FREE, THYROIDAB in the last 72 hours. Anemia Panel: No results for input(s): VITAMINB12, FOLATE, FERRITIN, TIBC, IRON, RETICCTPCT in the last 72 hours. Sepsis Labs: Recent Labs  Lab 06/20/20 2036 06/21/20 0558 06/21/20 1746  PROCALCITON 2.05 4.27 5.59  LATICACIDVEN 1.1  --   --     Recent Results (from the past 240 hour(s))  Blood culture (routine single)     Status: None   Collection Time: 06/18/20  3:08 PM   Specimen: BLOOD  Result Value Ref Range Status   Specimen Description BLOOD BLOOD LEFT ARM  Final   Special Requests   Final    BOTTLES DRAWN AEROBIC AND ANAEROBIC Blood Culture results may not be optimal due to an inadequate volume of blood received in culture bottles   Culture   Final    NO GROWTH 5 DAYS Performed at [facility], [address] [place], [place] [address]    Report Status 06/23/2020 FINAL  Final  SARS Coronavirus 2 by RT PCR (hospital order, performed in [place] hospital lab) Nasopharyngeal Nasopharyngeal Swab     Status: None   Collection Time: 06/18/20  3:08 PM   Specimen: Nasopharyngeal Swab  Result Value Ref Range Status   SARS Coronavirus 2 NEGATIVE NEGATIVE Final    Comment: (NOTE) SARS-CoV-2 target nucleic acids are NOT DETECTED.  The SARS-CoV-2 RNA is  generally detectable in upper and lower respiratory specimens during the acute phase of infection. The lowest concentration of SARS-CoV-2 viral copies this assay can detect is 250 copies / mL. A negative result does not preclude SARS-CoV-2 infection and should not be used as the sole basis for treatment or other patient management decisions.  A negative result may occur with improper specimen collection / handling, submission of specimen other than nasopharyngeal swab, presence of viral mutation(s) within the areas targeted by this assay, and inadequate number of viral copies (<250 copies / mL). A negative result must be combined with clinical observations, patient history, and epidemiological information.  Fact Sheet for Patients:   StrictlyIdeas.no  Fact Sheet for Healthcare Providers: BankingDealers.co.za  This test is not yet approved or  cleared by the [place] FDA and has been authorized for detection and/or diagnosis of SARS-CoV-2 by FDA under an Emergency Use Authorization (EUA).  This EUA will remain in effect (meaning this test can be used) for the duration of the COVID-19 declaration under Section 564(b)(1) of the Act, 21 U.S.C. section 360bbb-3(b)(1), unless the authorization is terminated or revoked sooner.  Performed at [facility], [address] [place], [place] [address]   MRSA PCR Screening     Status: None   Collection Time: 06/20/20  1:19 PM   Specimen: Nasal Mucosa; Nasopharyngeal  Result Value Ref Range Status   MRSA by PCR NEGATIVE NEGATIVE Final    Comment:        The GeneXpert MRSA Assay (FDA approved for NASAL specimens only), is one component of a comprehensive MRSA colonization surveillance program. It is not intended to diagnose MRSA infection nor to guide or monitor treatment for MRSA infections. Performed at [facility], [address] [place], [place] [address]    MRSA PCR Screening     Status: None   Collection Time: 06/21/20  3:55 PM   Specimen: Nasal Mucosa; Nasopharyngeal  Result Value Ref Range Status   MRSA by PCR NEGATIVE NEGATIVE Final    Comment:        The GeneXpert MRSA Assay (FDA approved for NASAL specimens only), is one component of a comprehensive MRSA colonization surveillance program. It is not intended to diagnose MRSA infection nor to guide or monitor treatment for MRSA infections. Performed at [facility], [place] [address], [place], [place] [address]   CSF culture     Status: None   Collection Time: 06/22/20  1:31 PM   Specimen: PATH Cytology CSF; Cerebrospinal Fluid  Result Value Ref Range Status   Specimen Description CSF  Final   Special Requests NONE  Final   Gram Stain   Final    WBC PRESENT,BOTH PMN AND MONONUCLEAR NO ORGANISMS SEEN CYTOSPIN SMEAR    Culture   Final    NO GROWTH 3 DAYS Performed at [facility], [place] [address] [place],  [address]    Report Status 06/25/2020 FINAL  Final  Culture, fungus without smear     Status: None (Preliminary result)   Collection Time: 06/22/20  1:31 PM   Specimen: PATH Cytology CSF; Cerebrospinal Fluid  Result Value Ref Range Status   Specimen Description CSF  Final   Special Requests NONE  Final   Culture   Final    No Fungi Isolated in 4 Weeks Performed at [facility] [address], [place],  [address]    Report Status PENDING  Incomplete  Radiology Studies: No results found.      Scheduled Meds: . aspirin  81 mg Per NG tube Daily  . carvedilol  25 mg Per Tube BID WC  . chlorhexidine  15 mL Mouth Rinse BID  . Chlorhexidine Gluconate Cloth  6 each Topical Q0600  . clopidogrel  75 mg Per NG tube Daily  . [START ON 06/28/2020] darbepoetin (ARANESP) injection - DIALYSIS  60 mcg Intravenous Q Sat-HD  . famotidine  20 mg Per Tube QHS  . feeding supplement (PROSource TF)  90 mL Per Tube BID  . heparin injection  (subcutaneous)  5,000 Units Subcutaneous Q8H  . insulin aspart  0-9 Units Subcutaneous Q4H  . mouth rinse  15 mL Mouth Rinse q12n4p   Continuous Infusions: . sodium chloride    . acyclovir 390 mg (06/27/20 0530)  . feeding supplement (OSMOLITE 1.5 CAL) 60 mL/hr at 06/27/20 0852  . levETIRAcetam 1,000 mg (06/27/20 0042)     LOS: 6 days    Time spent: 32 minutes     Barb Merino, MD [facility] Pager [phone]

## 2020-06-27 NOTE — CV Procedure (Signed)
BUE vein mapping completed.  Results can be found under chart review under CV PROC. 06/27/2020 6:06 PM Willo Yoon RVT, RDMS

## 2020-06-27 NOTE — Progress Notes (Signed)
Modified Barium Swallow Progress Note  Patient Details  Name: Adrian Evans MRN: [id number] Date of Birth: 07-09-1985  Today's Date: 06/27/2020  Modified Barium Swallow completed.  Full report located under Chart Review in the Imaging Section.  Brief recommendations include the following:  Clinical Impression  Pt exhibited oral dysphagia without laryngeal penetration or aspiration observed. 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 There were mild delays and holding of thin liquids. Mastication was mildly prolonged. Lingual residue spilled to pyriform sinuses with nectar. Once bolus reached oropharynx the strength and initiation of swallow mechanism was intact and effective to protect trachea. No residue post swallow. If he takes sequential sips thin or mixes textures he could have episodes of airway intrusion. Recommend Dys 2 texture, thin, straws allowed, meds via tube (crush when tube removed). Recommend keeping Cortrak to supplement intake as is predicted he may need until oral intake is adequate.   Swallow Evaluation Recommendations       SLP Diet Recommendations: Dysphagia 2 (Fine chop) solids;Thin liquid   Liquid Administration via: Straw;Cup   Medication Administration: Via alternative means (crushed when tube removed)   Supervision: Full assist for feeding;Staff to assist with self feeding   Compensations: Minimize environmental distractions;Slow rate;Small sips/bites;Lingual sweep for clearance of pocketing   Postural Changes: Seated upright at 90 degrees   Oral Care Recommendations: Oral care BID        [name] 06/27/2020,4:35 PM  [name].Ed Risk analyst [phone] Office [phone]

## 2020-06-27 NOTE — Progress Notes (Signed)
Pt bladder scanned, 882 mL reflected on scan. In and out performed per orders, 900 mL removed. No re-scan performed due to new orders for a foley catheter placement.    Pt tolerated procedure well.

## 2020-06-27 NOTE — Progress Notes (Signed)
[facility] Progress Note    Assessment/ Plan:   1. AKI on CKD 4 a. Baseline creatinine was 3.2 in October 2021.  EGFR of 27 mL/minute.  Underlying CKD is most likely due to T1DM.  Patient presented to the [facility] emergency department on 1/26 with SBP's in the 64s.  Renal ultrasound - no evidence of obstruction.  Patient received emergent dialysis at [facility] was transferred to [facility] for neuro support.  He was intubated on arrival for airway protection and started on CRRT from 1/29-2/1.  Patient received dialysis yesterday and his creatinine improved to 4.86 from 5.88.  Albumin of 1.7, phosphorus of 3.9, magnesium of 2.3, sodium of 138, potassium of 3.6.  We will monitor patient's urine output and repeat renal function panel tomorrow morning to determine the need for dialysis tomorrow b. Place Foley catheter out of concern for urinary retention to monitor urine output.  Produced 1400 mL of urine in the last 24 hours c. Vein mapping study ordered in preparation for possible need for fistula d. Daily renal panels e. Avoid nephrotoxic agents  2. Hypotension a. Most likely due to cardiac versus hypovolemic shock.  Has improved over the last 24 hours with blood pressures 136/95-170/107 b. Coreg increased to 25 mg twice daily on 2/3, continue this dose 3. VDRF a. Resolved, sedation discontinued on 2/1 and patient was extubated.   4. Acute encephalopathy a. Multifactorial in origin.  Uremia may be contributing but patient also has multiple scattered punctuate cerebellar infarcts on MRI.  EEG showed no seizure-like activity consistent with encephalopathy. b. Continue management per primary team 5. T1DM-being managed by primary team, longstanding 6. HFrEF with an EF of 20-25% 7. Anemia-hemoglobin stable at 9.2 this morning, has not required treatment yet. a. Daily CBC b. Initiating ESA on Saturdays- iron appears replete 8. Bones PTH of 170, phosphorus 3.9, no binder  given      Subjective:   Laying comfortably in bed this morning, no acute events overnight.  Patient significant other reports that he is doing much better this morning and has been moving all of his extremities.   Objective:   BP (!) 145/89 (BP Location: Left Arm)   Pulse 91   Temp 98.2 F (36.8 C) (Oral)   Resp 18   Wt 103.9 kg   SpO2 99%   BMI 31.07 kg/m   Intake/Output Summary (Last 24 hours) at 06/27/2020 1046 Last data filed at 06/26/2020 2000 Gross per 24 hour  Intake --  Output 1400 ml  Net -1400 ml   Weight change:   Physical Exam: Gen: Resting comfortably in bed, no acute distress CVS: Regular rate and rhythm with heart rate in the 90s, no murmurs appreciated Resp: Normal work of breathing with lungs clear to auscultation bilaterally Abd: Soft, nontender, positive bowel sounds Ext: No edema noted Dialysis Access: Patient has temporary HD cath in for 6 days at this time  Imaging: No results found.  Labs: BMET Recent Labs  Lab 06/23/20 0337 06/23/20 1549 06/24/20 1126 06/25/20 0537 06/25/20 2000 06/26/20 0335 06/27/20 0500  NA 138 136 135 135 137 136 138  K 3.9 3.8 3.8 3.8 3.9 4.6 3.6  CL 108 102 104 101 102 103 102  CO2 16* 20* 23 22 22  18* 24  GLUCOSE 105* 124* 165* 133* 190* 197* 230*  BUN 41* 31* 28* 33* 39* 44* 33*  CREATININE 6.05* 4.95* 3.44* 4.76* 5.60* 5.88* 4.86*  CALCIUM 7.7* 7.7* 7.7* 8.1* 8.4* 8.4* 8.2*  PHOS 2.1* 2.1* 2.5 4.8* 5.3* 5.2* 3.9   CBC Recent Labs  Lab 06/24/20 1126 06/25/20 0537 06/25/20 1920 06/26/20 0335 06/27/20 0500  WBC 10.3 11.0* 10.5 10.9* 10.3  NEUTROABS 9.2* 7.9*  --  8.0* 7.4  HGB 10.1* 9.2* 9.1* 9.2* 8.5*  HCT 29.0* 27.1* 26.6* 27.0* 24.5*  MCV 89.8 92.8 91.7 94.7 92.5  PLT 325 317 312 286 290    Medications:    . aspirin  81 mg Per NG tube Daily  . carvedilol  25 mg Per Tube BID WC  . chlorhexidine  15 mL Mouth Rinse BID  . Chlorhexidine Gluconate Cloth  6 each Topical Q0600  . clopidogrel  75  mg Per NG tube Daily  . [START ON 06/28/2020] darbepoetin (ARANESP) injection - DIALYSIS  60 mcg Intravenous Q Sat-HD  . famotidine  20 mg Per Tube QHS  . feeding supplement (PROSource TF)  90 mL Per Tube BID  . heparin injection (subcutaneous)  5,000 Units Subcutaneous Q8H  . insulin aspart  0-9 Units Subcutaneous Q4H  . mouth rinse  15 mL Mouth Rinse q12n4p     Gifford Shave, MD  [facility] Family Medicine Resident, PGY2 06/27/2020, 10:46 AM

## 2020-06-27 NOTE — Progress Notes (Signed)
New small skin tear/abrasion found around buttocks, likely due from the adhesive used on the rectal pouch. Rectal pouch removed at request of wife, skin cleaned and sacral foam applied to skin tear, barrier cream used on the surrounding skin for protection. Night RN updated on skin condition, will monitor.

## 2020-06-27 NOTE — Progress Notes (Signed)
Inpatient Rehabilitation Admissions Coordinator  I will follow up on Monday with his medical progress to determine when to initiate insurance authorization for a possible [facility] admit.  Danne Baxter, RN, MSN Rehab Admissions Coordinator [phone] 06/27/2020 3:42 PM

## 2020-06-27 NOTE — Progress Notes (Signed)
Physical Therapy Treatment Patient Details Name: Adrian Evans MRN: [id number] DOB: 1986-02-29 Today's Date: 06/27/2020    History of Present Illness 35 yo M admitted to [facility] 1/26 w n/v malaise, weakness. Hypotensive in ED and volume resuscitated. Worse AMS without clear etiology, transferring to [facility] 1/29 for LP and cEEG. MRI revealed multiple scattered punctate acute ishcemic infarcts involving both cerebral hemisphers, R greater than L and a small remote L PCA territory infarct, with additional small remote L thalamic and R cerebellar infarcts. Pt extubated 2/1.    PT Comments    Pt able to keep his eyes open and verbalize a few statements this date. However, he continues to be limited cognitively with delayed but improved processing times compared to previous session. He has poor awareness of his deficits and safety, needing max multi-modal cues throughout to keep him on task. He continues to require maxAx2 to come to sit EOB from supine but only modAx2 to return to supine as he displays improved active participation today. In addition, attempted sit to stand from EOB with stedy with pt actively trying but unable to achieve full stand even with maxAx2. His sitting balance is improving but he still needs external assistance secondary to his ataxic movements and delayed reactional strategies placing him at risk for falls. Will continue to follow acutely. Current recommendations remain appropriate.   Follow Up Recommendations  CIR     Equipment Recommendations  Wheelchair (measurements PT);Wheelchair cushion (measurements PT);Hospital bed;3in1 (PT);Rolling walker with 5" wheels;Other (comment) (mechanical lift; may change as pt progresses)    Recommendations for Other Services       Precautions / Restrictions Precautions Precautions: Fall Precaution Comments: R visual field deficits?, dysarthric, easily distracted, poor frustration tolerance, ataxic Restrictions Weight Bearing  Restrictions: No    Mobility  Bed Mobility Overal bed mobility: Needs Assistance Bed Mobility: Sit to Supine;Supine to Sit     Supine to sit: Max assist;+2 for physical assistance;+2 for safety/equipment;HOB elevated Sit to supine: Mod assist;+2 for physical assistance;+2 for safety/equipment   General bed mobility comments: Max multi-modal cues to initiate leg movement towards L EOB, with pt initiating with improved processing time this date. MaxAx2 to manage legs and trunk for supine > sit. ModAx2 to manage legs and trunk with pt's impulsive return to supine to ensure safety.  Transfers Overall transfer level: Needs assistance Equipment used: Ambulation equipment used Transfers: Sit to/from Stand Sit to Stand: +2 physical assistance;+2 safety/equipment;From elevated surface;Max assist         General transfer comment: Sit to stand attempted 1x from elevated EOB with bil knee blocked with stedy and hands on bar of stedy cuing pt to pull up to stand. Pt displayed active attempt but even with maxAx2 did not achieve full standing as he quickly fatigued and sat down again.  Ambulation/Gait             General Gait Details: unable at this time   Stairs             Wheelchair Mobility    Modified Rankin (Stroke Patients Only) Modified Rankin (Stroke Patients Only) Pre-Morbid Rankin Score: No symptoms Modified Rankin: Severe disability     Balance Overall balance assessment: Needs assistance Sitting-balance support: Feet supported;Bilateral upper extremity supported Sitting balance-Leahy Scale: Poor Sitting balance - Comments: Improved static sitting balance of pt needing min-modA majority of time and brief periods of min guard, but ataxic movements and poor core control resulting in LOB in all direction (anterior or  posterior greater than laterally). Postural control: Posterior lean Standing balance support: Bilateral upper extremity supported Standing  balance-Leahy Scale: Zero Standing balance comment: Unable to come to full stand in stedy with maxAx2.                            Cognition Arousal/Alertness: Lethargic Behavior During Therapy: Flat affect;Impulsive Overall Cognitive Status: Impaired/Different from baseline Area of Impairment: Attention;Following commands;Safety/judgement;Problem solving;Awareness;Memory                   Current Attention Level: Selective Memory: Decreased short-term memory;Decreased recall of precautions Following Commands: Follows one step commands with increased time;Follows one step commands inconsistently (follows commands approx 75% of time) Safety/Judgement: Decreased awareness of safety;Decreased awareness of deficits Awareness: Intellectual Problem Solving: Slow processing;Decreased initiation;Difficulty sequencing;Requires verbal cues;Requires tactile cues General Comments: pt with lethargy upon arrival and needing cues to open eyes. Improved response time to simple cues today. responds better to multi-modal cues. Delayed processing and reactional strategies with poor awareness of safety or deficits. Easily distracted and benefits from quiet environments when cuing pt.      Exercises      General Comments        Pertinent Vitals/Pain Pain Assessment: Faces Faces Pain Scale: Hurts a little bit Pain Location: generalized with noxious stimuli Pain Descriptors / Indicators: Grimacing Pain Intervention(s): Limited activity within patient's tolerance;Monitored during session;Repositioned    Home Living                      Prior Function            PT Goals (current goals can now be found in the care plan section) Acute Rehab PT Goals Patient Stated Goal: to go to bed PT Goal Formulation: With patient Time For Goal Achievement: 07/09/20 Potential to Achieve Goals: Good Progress towards PT goals: Progressing toward goals    Frequency    Min  4X/week      PT Plan Current plan remains appropriate    Co-evaluation              AM-PAC PT "6 Clicks" Mobility   Outcome Measure  Help needed turning from your back to your side while in a flat bed without using bedrails?: Total Help needed moving from lying on your back to sitting on the side of a flat bed without using bedrails?: A Lot Help needed moving to and from a bed to a chair (including a wheelchair)?: Total Help needed standing up from a chair using your arms (e.g., wheelchair or bedside chair)?: Total Help needed to walk in hospital room?: Total Help needed climbing 3-5 steps with a railing? : Total 6 Click Score: 7    End of Session Equipment Utilized During Treatment: Gait belt Activity Tolerance: Patient limited by fatigue;Other (comment) (pt frustrated) Patient left: in bed;with call bell/phone within reach;with bed alarm set Nurse Communication: Mobility status PT Visit Diagnosis: Unsteadiness on feet (R26.81);Muscle weakness (generalized) (M62.81);Difficulty in walking, not elsewhere classified (R26.2);Other symptoms and signs involving the nervous system (R29.898)     Time: [id number] PT Time Calculation (min) (ACUTE ONLY): 17 min  Charges:  $Therapeutic Activity: 8-22 mins                     Moishe Spice, PT, DPT Acute Rehabilitation Services  Pager: [phone] Office: [place] 06/27/2020, 3:56 PM

## 2020-06-28 LAB — RENAL FUNCTION PANEL
Albumin: 1.6 g/dL — ABNORMAL LOW (ref 3.5–5.0)
Anion gap: 9 (ref 5–15)
BUN: 40 mg/dL — ABNORMAL HIGH (ref 6–20)
CO2: 25 mmol/L (ref 22–32)
Calcium: 8.3 mg/dL — ABNORMAL LOW (ref 8.9–10.3)
Chloride: 107 mmol/L (ref 98–111)
Creatinine, Ser: 5.87 mg/dL — ABNORMAL HIGH (ref 0.61–1.24)
GFR, Estimated: 12 mL/min — ABNORMAL LOW (ref >60)
Glucose, Bld: 217 mg/dL — ABNORMAL HIGH (ref 70–99)
Phosphorus: 4.4 mg/dL (ref 2.5–4.6)
Potassium: 3.6 mmol/L (ref 3.5–5.1)
Sodium: 141 mmol/L (ref 135–145)

## 2020-06-28 LAB — GLUCOSE, CAPILLARY
Glucose-Capillary: 107 mg/dL — ABNORMAL HIGH (ref 70–99)
Glucose-Capillary: 125 mg/dL — ABNORMAL HIGH (ref 70–99)
Glucose-Capillary: 159 mg/dL — ABNORMAL HIGH (ref 70–99)
Glucose-Capillary: 166 mg/dL — ABNORMAL HIGH (ref 70–99)
Glucose-Capillary: 184 mg/dL — ABNORMAL HIGH (ref 70–99)
Glucose-Capillary: 87 mg/dL (ref 70–99)

## 2020-06-28 LAB — TRIGLYCERIDES: Triglycerides: 68 mg/dL (ref <150)

## 2020-06-28 LAB — MAGNESIUM: Magnesium: 2.3 mg/dL (ref 1.7–2.4)

## 2020-06-28 MED ORDER — INFLUENZA VAC SPLIT QUAD 0.5 ML IM SUSY
0.5000 mL | PREFILLED_SYRINGE | INTRAMUSCULAR | Status: AC
  Administered 2020-06-30: 0.5 mL via INTRAMUSCULAR
  Filled 2020-06-28: qty 0.5

## 2020-06-28 MED ORDER — HEPARIN SODIUM (PORCINE) 1000 UNIT/ML IJ SOLN
INTRAMUSCULAR | Status: AC
  Administered 2020-06-28: 1000 [IU]
  Filled 2020-06-28: qty 3

## 2020-06-28 MED ORDER — DARBEPOETIN ALFA 60 MCG/0.3ML IJ SOSY
PREFILLED_SYRINGE | INTRAMUSCULAR | Status: AC
  Filled 2020-06-28: qty 0.3

## 2020-06-28 MED ORDER — POLYETHYLENE GLYCOL 3350 17 G PO PACK: 17.0000 g | PACK | Freq: Every day | ORAL | Status: DC | PRN

## 2020-06-28 MED ORDER — OSMOLITE 1.5 CAL PO LIQD: 1000.0000 mL | ORAL | Status: DC

## 2020-06-28 MED ORDER — DOCUSATE SODIUM 50 MG/5ML PO LIQD: 100.0000 mg | Freq: Two times a day (BID) | ORAL | Status: DC | PRN

## 2020-06-28 NOTE — Progress Notes (Signed)
Patient off unit for procedure.

## 2020-06-28 NOTE — Progress Notes (Signed)
Patient ID: Adrian Evans, male   DOB: 1985/06/26, 35 y.o.   MRN: [id number] [facility] Progress Note   Assessment/ Plan:   1. Acute kidney Injury on chronic kidney disease stage IV: Baseline creatinine suspected to be in the mid 3 range (underlying CKD likely from DM) and presented to the hospital with acute kidney injury in the setting of cardiogenic versus hypovolemic shock and ATN.  Initially underwent CRRT and was transitioned to intermittent hemodialysis after becoming more clinically/hemodynamically stable.  He had decent urine output overnight (1.9 L) with a rise of BUN/creatinine indicating a lag in renal recovery.  I will order for an additional hemodialysis treatment today for clearance to try and see if we can make a positive impact on his mental status. 2.  Acute metabolic encephalopathy: Suspected to be multifactorial in origin with severe azotemia/uremia but patient also noted to have had multiple scattered punctate cerebellar infarcts on MRI raising some concern. 3.  Congestive heart failure with reduced ejection fraction: 4.  Anemia: Secondary to chronic kidney disease and exacerbated by acute/critical illness 5.  Secondary hyperparathyroidism: Calcium and phosphorus level within acceptable limits; continue to monitor with sequential labs.  Subjective:   He is more awake today-informs me that he is able to urinate.  Denies any chest pain or shortness of breath.   Objective:   BP 130/78 (BP Location: Left Arm)   Pulse 71   Temp (!) 97.5 F (36.4 C) (Oral)   Resp 17   Wt 103.9 kg   SpO2 94%   BMI 31.07 kg/m   Intake/Output Summary (Last 24 hours) at 06/28/2020 1051 Last data filed at 06/28/2020 0900 Gross per 24 hour  Intake 120 ml  Output 2850 ml  Net -2730 ml   Weight change:   Physical Exam: Gen: Comfortably resting in recliner, awakens to calling out his name and with some delayed verbal responses CVS: Pulse regular rhythm, normal rate, S1 and S2  normal Resp: Coarse breath sounds-transmitted, no rales/rhonchi Abd: Soft, flat, nontender, bowel sounds normal Ext: No lower extremity edema  Imaging: DG Swallowing Func-Speech Pathology  Result Date: 06/27/2020 Objective Swallowing Evaluation: Type of Study: MBS-Modified Barium Swallow Study  Patient Details Name: Adrian Evans MRN: [id number] Date of Birth: 12-26-1985 Today's Date: 06/27/2020 Time: SLP Start Time (ACUTE ONLY): 1336 -SLP Stop Time (ACUTE ONLY): 1356 SLP Time Calculation (min) (ACUTE ONLY): 20 min Past Medical History: Past Medical History: Diagnosis Date . Diabetes mellitus without complication [facility])  Past Surgical History: Past Surgical History: Procedure Laterality Date . EYE SURGERY   . RIGHT/LEFT HEART CATH AND CORONARY ANGIOGRAPHY N/A 05/25/2018  Procedure: RIGHT/LEFT HEART CATH AND CORONARY ANGIOGRAPHY;  Surgeon: Corey Skains, MD;  Location: [place] [facility];  Service: Cardiovascular;  Laterality: N/A; . TONSILLECTOMY   HPI: [name] is a 35 y.o. male admitted with generalized weakness, malaise, nausea vomiting, nasal congestion/sneezing/cough. Found to be in shock, concern for sepis, acute renal failure and acute stroke. Intubated 1/29-2/1 after becoming difficult to arouse. MRI multiple scattered punctate acute to early subacute ischemic white matter infarcts involving the periventricular and deep white matter of both cerebral hemispheres, right greater than left. 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 PMH: type 1 diabetes, EF 20 to 25%, CKD stage IIIb, hypertension.  Chest x-ray showed severe cardiomegaly, pulmonary venous congestion, mild bilateral interstitial prominence (mild CHF versus pneumonitis cannot be excluded).  No data recorded Assessment /  Plan / Recommendation CHL IP CLINICAL IMPRESSIONS 06/27/2020 Clinical Impression Pt exhibited oral dysphagia without laryngeal penetration or  aspiration observed. 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 There were mild delays and holding of thin liquids. Mastication was mildly prolonged. Lingual residue spilled to pyriform sinuses with nectar. Once bolus reached oropharynx the strength and initiation of swallow mechanism was intact and effective to protect trachea. No residue post swallow. If he takes sequential sips thin or mixes textures he could have episodes of airway intrusion. Recommend Dys 2 texture, thin, straws allowed, meds via tube (crush when tube removed). Recommend keeping Cortrak to supplement intake as is predicted he may need until oral intake is adequate. SLP Visit Diagnosis Dysphagia, oral phase (R13.11) Attention and concentration deficit following -- Frontal lobe and executive function deficit following -- Impact on safety and function Mild aspiration risk   CHL IP TREATMENT RECOMMENDATION 06/27/2020 Treatment Recommendations Therapy as outlined in treatment plan below   Prognosis 06/27/2020 Prognosis for Safe Diet Advancement Good Barriers to Reach Goals (No Data) Barriers/Prognosis Comment -- CHL IP DIET RECOMMENDATION 06/27/2020 SLP Diet Recommendations Dysphagia 2 (Fine chop) solids;Thin liquid Liquid Administration via Straw;Cup Medication Administration Via alternative means Compensations Minimize environmental distractions;Slow rate;Small sips/bites;Lingual sweep for clearance of pocketing Postural Changes Seated upright at 90 degrees   CHL IP OTHER RECOMMENDATIONS 06/27/2020 Recommended Consults -- Oral Care Recommendations Oral care BID Other Recommendations --   CHL IP FOLLOW UP RECOMMENDATIONS 06/27/2020 Follow up Recommendations Inpatient Rehab   CHL IP FREQUENCY AND DURATION 06/27/2020 Speech Therapy Frequency (ACUTE ONLY) min 2x/week Treatment Duration 2 weeks      CHL IP ORAL PHASE 06/27/2020 Oral Phase Impaired Oral - Pudding Teaspoon -- Oral - Pudding Cup -- Oral - Honey Teaspoon -- Oral - Honey Cup -- Oral -  Nectar Teaspoon -- Oral - Nectar Cup Lingual/palatal residue Oral - Nectar Straw -- Oral - Thin Teaspoon -- Oral - Thin Cup Delayed oral transit Oral - Thin Straw Delayed oral transit Oral - Puree Delayed oral transit Oral - Mech Soft -- Oral - Regular Weak lingual manipulation Oral - Multi-Consistency -- Oral - Pill -- Oral Phase - Comment --  CHL IP PHARYNGEAL PHASE 06/27/2020 Pharyngeal Phase WFL Pharyngeal- Pudding Teaspoon -- Pharyngeal -- Pharyngeal- Pudding Cup -- Pharyngeal -- Pharyngeal- Honey Teaspoon -- Pharyngeal -- Pharyngeal- Honey Cup -- Pharyngeal -- Pharyngeal- Nectar Teaspoon -- Pharyngeal -- Pharyngeal- Nectar Cup WFL Pharyngeal -- Pharyngeal- Nectar Straw -- Pharyngeal -- Pharyngeal- Thin Teaspoon -- Pharyngeal -- Pharyngeal- Thin Cup WFL Pharyngeal -- Pharyngeal- Thin Straw WFL Pharyngeal -- Pharyngeal- Puree WFL Pharyngeal -- Pharyngeal- Mechanical Soft -- Pharyngeal -- Pharyngeal- Regular WFL Pharyngeal -- Pharyngeal- Multi-consistency -- Pharyngeal -- Pharyngeal- Pill -- Pharyngeal -- Pharyngeal Comment --  CHL IP CERVICAL ESOPHAGEAL PHASE 06/27/2020 Cervical Esophageal Phase WFL Pudding Teaspoon -- Pudding Cup -- Honey Teaspoon -- Honey Cup -- Nectar Teaspoon -- Nectar Cup -- Nectar Straw -- Thin Teaspoon -- Thin Cup -- Thin Straw -- Puree -- Mechanical Soft -- Regular -- Multi-consistency -- Pill -- Cervical Esophageal Comment -- [name] 06/27/2020, 4:34 PM  Orbie Pyo Litaker M.Ed Actor Pager [phone] Office [phone]             VAS [place] UPPER EXT VEIN MAPPING (PRE-OP AVF)  Result Date: 06/27/2020 UPPER EXTREMITY VEIN MAPPING  Indications: Pre-access. History: Renal failure.  Limitations: Patient with AMS tightening and holding arms to his sides - very  difficult exam Performing Technologist: Rogelia Rohrer  Examination Guidelines: A complete evaluation includes B-mode imaging, spectral Doppler, color Doppler, and power Doppler as needed of all  accessible portions of each vessel. Bilateral testing is considered an integral part of a complete examination. Limited examinations for reoccurring indications may be performed as noted. +-----------------+-------------+----------+--------------+ Right Cephalic   Diameter (cm)Depth (cm)   Findings    +-----------------+-------------+----------+--------------+ Shoulder                                not visualized +-----------------+-------------+----------+--------------+ Prox upper arm                          not visualized +-----------------+-------------+----------+--------------+ Mid upper arm                           not visualized +-----------------+-------------+----------+--------------+ Dist upper arm                          not visualized +-----------------+-------------+----------+--------------+ Antecubital fossa                       not visualized +-----------------+-------------+----------+--------------+ Prox forearm                            not visualized +-----------------+-------------+----------+--------------+ Mid forearm                             not visualized +-----------------+-------------+----------+--------------+ Dist forearm                            not visualized +-----------------+-------------+----------+--------------+ Wrist                                   not visualized +-----------------+-------------+----------+--------------+ +-----------------+-------------+----------+---------+ Right Basilic    Diameter (cm)Depth (cm)Findings  +-----------------+-------------+----------+---------+ Prox upper arm       0.41                         +-----------------+-------------+----------+---------+ Mid upper arm        0.48                         +-----------------+-------------+----------+---------+ Dist upper arm       0.43                         +-----------------+-------------+----------+---------+  Antecubital fossa    0.49               branching +-----------------+-------------+----------+---------+ Prox forearm         0.35               branching +-----------------+-------------+----------+---------+ Mid forearm          0.12               branching +-----------------+-------------+----------+---------+ Distal forearm       0.12                         +-----------------+-------------+----------+---------+ Wrist  0.12                         +-----------------+-------------+----------+---------+ Subcutaneous edema from [facility] fossa to mid/distal forearm. +-----------------+-------------+----------+---------+ Left Cephalic    Diameter (cm)Depth (cm)Findings  +-----------------+-------------+----------+---------+ Shoulder             0.17        0.75             +-----------------+-------------+----------+---------+ Prox upper arm       0.24        0.49             +-----------------+-------------+----------+---------+ Mid upper arm        0.30        0.38             +-----------------+-------------+----------+---------+ Dist upper arm       0.24        0.37             +-----------------+-------------+----------+---------+ Antecubital fossa    0.46        0.48   branching +-----------------+-------------+----------+---------+ Prox forearm         0.30        0.63             +-----------------+-------------+----------+---------+ Mid forearm          0.29        0.62             +-----------------+-------------+----------+---------+ Dist forearm         0.27        0.31             +-----------------+-------------+----------+---------+ Wrist                0.10        0.31             +-----------------+-------------+----------+---------+ +-----------------+-------------+----------+--------------------------+ Left Basilic     Diameter (cm)Depth (cm)         Findings           +-----------------+-------------+----------+--------------------------+ Prox upper arm       0.44                                          +-----------------+-------------+----------+--------------------------+ Mid upper arm        0.38                                          +-----------------+-------------+----------+--------------------------+ Dist upper arm       0.31                       branching          +-----------------+-------------+----------+--------------------------+ Antecubital fossa    0.50                       branching          +-----------------+-------------+----------+--------------------------+ Prox forearm         0.23                       branching          +-----------------+-------------+----------+--------------------------+ Mid forearm          0.17  Age Indeterminate Thrombus +-----------------+-------------+----------+--------------------------+ Distal forearm       0.13                                          +-----------------+-------------+----------+--------------------------+ Wrist                                         not visualized       +-----------------+-------------+----------+--------------------------+ Summary: Right: Subcutaneous fluid around area of IV in AC fossa. Left: Age indeterminate thrombus in mid forearm (basilic vein). *See table(s) above for measurements and observations.  Diagnosing physician:    Preliminary     Labs: BMET Recent Labs  Lab 06/23/20 1549 06/24/20 1126 06/25/20 0537 06/25/20 2000 06/26/20 0335 06/27/20 0500 06/28/20 0602  NA 136 135 135 137 136 138 141  K 3.8 3.8 3.8 3.9 4.6 3.6 3.6  CL 102 104 101 102 103 102 107  CO2 20* '23 22 22 '$ 18* 24 25  GLUCOSE 124* 165* 133* 190* 197* 230* 217*  BUN 31* 28* 33* 39* 44* 33* 40*  CREATININE 4.95* 3.44* 4.76* 5.60* 5.88* 4.86* 5.87*  CALCIUM 7.7* 7.7* 8.1* 8.4* 8.4* 8.2* 8.3*  PHOS 2.1* 2.5 4.8* 5.3* 5.2* 3.9 4.4    CBC Recent Labs  Lab 06/24/20 1126 06/25/20 0537 06/25/20 1920 06/26/20 0335 06/27/20 0500  WBC 10.3 11.0* 10.5 10.9* 10.3  NEUTROABS 9.2* 7.9*  --  8.0* 7.4  HGB 10.1* 9.2* 9.1* 9.2* 8.5*  HCT 29.0* 27.1* 26.6* 27.0* 24.5*  MCV 89.8 92.8 91.7 94.7 92.5  PLT 325 317 312 286 290    Medications:    . aspirin  81 mg Per NG tube Daily  . carvedilol  25 mg Per Tube BID WC  . chlorhexidine  15 mL Mouth Rinse BID  . Chlorhexidine Gluconate Cloth  6 each Topical Q0600  . clopidogrel  75 mg Per NG tube Daily  . darbepoetin (ARANESP) injection - DIALYSIS  60 mcg Intravenous Q Sat-HD  . famotidine  20 mg Per Tube QHS  . feeding supplement (PROSource TF)  90 mL Per Tube BID  . heparin injection (subcutaneous)  5,000 Units Subcutaneous Q8H  . [START ON 06/29/2020] influenza vac split quadrivalent PF  0.5 mL Intramuscular Tomorrow-1000  . insulin aspart  0-9 Units Subcutaneous Q4H  . mouth rinse  15 mL Mouth Rinse q12n4p      Elmarie Shiley, MD 06/28/2020, 10:51 AM

## 2020-06-28 NOTE — Progress Notes (Signed)
PROGRESS NOTE    Adrian Evans  [id number] DOB: 07-01-1985 DOA: 06/21/2020 PCP: Lenard Simmer, MD    Brief Narrative:  Patient is 35 year old gentleman who has history of type 1 diabetes on insulin, diabetic retinopathy, hypertension, stage IIIb chronic kidney disease who presented to [facility] on 1/26 with nausea, vomiting, malaise and weakness.  Patient went to work in the morning, he did not feel well so taken to the emergency room.  Wife reports that he had poor appetite and felt body ache for the last few days. Significant events: 1/26 admitted at [facility] in shock felt to be hypovolemic  1/27 cards consult, nephrology consult. Surgery and vascular consults for HD access 1/28 hypotensive and transferred to ICU. AMS, sz-like episodes During tele neuro eval. Rec HD 1/29 Keppra, meningitis coverage, [facility] for [facility]EEG and LP 1/30 LP performed by IR, MRI showed multiple cerebral infarcts1/26 admitted, in shock felt to be hypovolemic   1/28 CT H no acute intracranial abnormality -- ill defined low density focus in L thalamus 1/28 CTA h/n> no LVO  2/5 significant improvement in mental status.  Able to swallow.  1/29 MRI Brain 1. Multiple scattered punctate acute to early subacute ischemic white matter infarcts involving the periventricular and deep white matter of both cerebral hemispheres, right greater than left. No associated hemorrhage or mass effect. 2. 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   Assessment & Plan:   Active Problems:   Acute encephalopathy   Acute cerebral infarction ([place])   Diabetes mellitus type I ([place])   Stage 3 chronic kidney disease ([place])   Essential hypertension   Chronic systolic congestive heart failure (HCC)   Dysphagia, post-stroke  Acute encephalopathy, multifactorial.  Also suspected viral encephalitis. Suspected secondary to uremia, multiple stroke.  Mental  status much improved and normalizing now.  Will need aggressive rehab.  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 Negative for bacterial meningitis. Negative for autoimmune meningitis.  QuantiFERON and HIV negative.  Hepatitis panel negative.  Covid negative.  Streptococcus pneumoniae antigen negative.  Autoimmune work-up negative. Varicella-zoster PCR negative. No evidence of bacterial infection, antibiotics discontinued. HSV 1/2 PCR, apparently not done by lab.  Sent today.  Has high suspicion of viral encephalitis, will continue acyclovir for 10 days.  Seizure disorder: Reported seizure like episodes more likely uremic symptoms.  Seen by neurology.  Currently remains on Keppra. 3 days video EEG did not demonstrate any seizures. May gradually taper off Keppra once completes rehab.  Shock: Suspect hypovolemic shock.  Transiently on vasopressors.  Now off vasopressors.  Acute stroke: Acute multiple territory stroke thought to be secondary to hypotension and hypoperfusion. Encephalopathic, no focal deficit.  Aggressive rehab. Refer to CIR. Aspirin and plavix for 3 weeks and aspirin alone as suggested by neurology. LDL 44, no statin needed  A1c 7.4, on insulin.  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  Dialysis today. Has right IJ temporary catheter. Significant urinary retention, status post Foley catheter placement, 1.9 L output last 24 hours. Scheduled for dialysis today.  Acute hypoxemic respiratory failure secondary to altered mental status: Extubated to nasal cannula oxygen on 2/1. Now on room air.  Acute on chronic combined heart failure: Previous EF 45%.  TEE with ejection fraction of 20 to 25%. Suspect acute cardiomyopathy stress. on coreg. Will not tolerate ACE or  ARB Will repeat limited echocardiogram after  clinical improvement. Avoiding diuretics with anticipating improvement in renal functions. Fluid balance achieved by dialysis.  Type 1 diabetes: Currently only on sliding scale insulin.  Stable.  Diet: Patient ate 50% of breakfast in the morning. Change tube feeding to nocturnal tube feeding to encourage daytime eating. If he starts eating more than 50% of the meal, will discontinue core track tomorrow.  Discussed with nephrology.    DVT prophylaxis: heparin injection 5,000 Units Start: 06/24/20 1400 SCDs Start: 06/21/20 1551   Code Status: Full code Family Communication: Wife at the bedside Disposition Plan: Status is: Inpatient  Remains inpatient appropriate because:Persistent severe electrolyte disturbances, Altered mental status and Inpatient level of care appropriate due to severity of illness   Dispo: The patient is from: Home              Anticipated d/c is to: CIR              Anticipated d/c date is: 1 to 2 days.              Patient currently is not medically stable to d/c.   Difficult to place patient No         Consultants:   PCCM  Neurology  Surgery  Procedures:   Multiple procedures.  Now on hemodialysis.  Antimicrobials:  1/29 acyclovir>  1/29 ampicillin> 2/3 1/29 rocephin> 2/3 1/29 vanc> 2/3   Subjective: Patient seen and examined.  He is more communicating and alert today.  He is able to keep up conversation and tell us about his symptoms on presentation. Sitting in couch.  He buckled his knees and fell on the floor while attempting to move from bed to chair by his wife and nurse.  No injury. Foley catheter, 1900 mL urine last 24 hours.  Objective: Vitals:   06/27/20 2035 06/28/20 0359 06/28/20 1001 06/28/20 1218  BP: 139/81 130/78  131/81  Pulse: 69 71  66  Resp: '17 17  16  '$ Temp: 98 F (36.7 C) 98 F (36.7 C) (!) 97.5 F (36.4 C) 97.7 F (36.5 C)  TempSrc:   Oral Oral  SpO2:  94%  100%  Weight:        Intake/Output Summary  (Last 24 hours) at 06/28/2020 1314 Last data filed at 06/28/2020 0900 Gross per 24 hour  Intake 120 ml  Output 2850 ml  Net -2730 ml   Filed Weights   06/25/20 0500 06/26/20 0730 06/26/20 1005  Weight: 96 kg 103.9 kg 103.9 kg    Examination:  General exam: Appears calm , comfortable and conversant. Respiratory system: Clear to auscultation. Respiratory effort normal. No added sounds. Cardiovascular system: S1 & S2 heard, RRR. No JVD, murmurs, rubs, gallops or clicks. No pedal edema. Gastrointestinal system: soft and non tender. BS present. Tube feeding infusing. Central nervous system: Patient alert awake and oriented.  Able to have clear conversation, speech is clear. Extremities: generalized weakness. Foley catheter with clear urine.     Data Reviewed: I have personally reviewed following labs and imaging studies  CBC: Recent Labs  Lab 06/23/20 0337 06/24/20 1126 06/25/20 0537 06/25/20 1920 06/26/20 0335 06/27/20 0500  WBC 10.2 10.3 11.0* 10.5 10.9* 10.3  NEUTROABS 8.4* 9.2* 7.9*  --  8.0* 7.4  HGB 10.7* 10.1* 9.2* 9.1* 9.2* 8.5*  HCT 30.2* 29.0* 27.1* 26.6* 27.0* 24.5*  MCV 89.1 89.8 92.8 91.7 94.7 92.5  PLT 331 325 317 312 286 [id number]   Basic Metabolic Panel:  Recent Labs  Lab 06/24/20 1126 06/25/20 0537 06/25/20 2000 06/26/20 0335 06/27/20 0500 06/28/20 0602  NA 135 135 137 136 138 141  K 3.8 3.8 3.9 4.6 3.6 3.6  CL 104 101 102 103 102 107  CO2 '23 22 22 '$ 18* 24 25  GLUCOSE 165* 133* 190* 197* 230* 217*  BUN 28* 33* 39* 44* 33* 40*  CREATININE 3.44* 4.76* 5.60* 5.88* 4.86* 5.87*  CALCIUM 7.7* 8.1* 8.4* 8.4* 8.2* 8.3*  MG 2.4 2.4  --  2.5* 2.3 2.3  PHOS 2.5 4.8* 5.3* 5.2* 3.9 4.4   GFR: Estimated Creatinine Clearance: 21.5 mL/min (A) (by C-G formula based on SCr of 5.87 mg/dL (H)). Liver Function Tests: Recent Labs  Lab 06/21/20 1746 06/22/20 0513 06/22/20 1554 06/25/20 0537 06/25/20 2000 06/26/20 0335 06/27/20 0500 06/28/20 0602  AST 8* 9*  --    --   --   --   --   --   ALT 12 10  --   --   --   --   --   --   ALKPHOS 73 75  --   --   --   --   --   --   BILITOT 1.8* 2.0*  --   --   --   --   --   --   PROT 5.9* 6.0*  --   --   --   --   --   --   ALBUMIN 2.3* 2.0*  2.0*   < > 1.7* 1.7* 1.8* 1.7* 1.6*   < > = values in this interval not displayed.   Recent Labs  Lab 06/21/20 1746  LIPASE 22  AMYLASE 46   No results for input(s): AMMONIA in the last 168 hours. Coagulation Profile: No results for input(s): INR, PROTIME in the last 168 hours. Cardiac Enzymes: Recent Labs  Lab 06/22/20 0513  CKTOTAL 198   BNP (last 3 results) No results for input(s): PROBNP in the last 8760 hours. HbA1C: No results for input(s): HGBA1C in the last 72 hours. CBG: Recent Labs  Lab 06/27/20 2244 06/27/20 2322 06/28/20 0357 06/28/20 0841 06/28/20 1222  GLUCAP 149* 158* 159* 184* 166*   Lipid Profile: Recent Labs    06/28/20 0602  TRIG 68   Thyroid Function Tests: No results for input(s): TSH, T4TOTAL, FREET4, T3FREE, THYROIDAB in the last 72 hours. Anemia Panel: No results for input(s): VITAMINB12, FOLATE, FERRITIN, TIBC, IRON, RETICCTPCT in the last 72 hours. Sepsis Labs: Recent Labs  Lab 06/21/20 1746  PROCALCITON 5.59    Recent Results (from the past 240 hour(s))  Blood culture (routine single)     Status: None   Collection Time: 06/18/20  3:08 PM   Specimen: BLOOD  Result Value Ref Range Status   Specimen Description BLOOD BLOOD LEFT ARM  Final   Special Requests   Final    BOTTLES DRAWN AEROBIC AND ANAEROBIC Blood Culture results may not be optimal due to an inadequate volume of blood received in culture bottles   Culture   Final    NO GROWTH 5 DAYS Performed at [facility], [address] [place], [place] [address]    Report Status 06/23/2020 FINAL  Final  SARS Coronavirus 2 by RT PCR (hospital order, performed in [place] hospital lab) Nasopharyngeal Nasopharyngeal Swab     Status: None    Collection Time: 06/18/20  3:08 PM   Specimen: Nasopharyngeal Swab  Result Value Ref Range Status   SARS  Coronavirus 2 NEGATIVE NEGATIVE Final    Comment: (NOTE) SARS-CoV-2 target nucleic acids are NOT DETECTED.  The SARS-CoV-2 RNA is generally detectable in upper and lower respiratory specimens during the acute phase of infection. The lowest concentration of SARS-CoV-2 viral copies this assay can detect is 250 copies / mL. A negative result does not preclude SARS-CoV-2 infection and should not be used as the sole basis for treatment or other patient management decisions.  A negative result may occur with improper specimen collection / handling, submission of specimen other than nasopharyngeal swab, presence of viral mutation(s) within the areas targeted by this assay, and inadequate number of viral copies (<250 copies / mL). A negative result must be combined with clinical observations, patient history, and epidemiological information.  Fact Sheet for Patients:   StrictlyIdeas.no  Fact Sheet for Healthcare Providers: BankingDealers.co.za  This test is not yet approved or  cleared by the [place] FDA and has been authorized for detection and/or diagnosis of SARS-CoV-2 by FDA under an Emergency Use Authorization (EUA).  This EUA will remain in effect (meaning this test can be used) for the duration of the COVID-19 declaration under Section 564(b)(1) of the Act, 21 U.S.C. section 360bbb-3(b)(1), unless the authorization is terminated or revoked sooner.  Performed at [facility], [address] [place], [place] [address]   MRSA PCR Screening     Status: None   Collection Time: 06/20/20  1:19 PM   Specimen: Nasal Mucosa; Nasopharyngeal  Result Value Ref Range Status   MRSA by PCR NEGATIVE NEGATIVE Final    Comment:        The GeneXpert MRSA Assay (FDA approved for NASAL specimens only), is one component of  a comprehensive MRSA colonization surveillance program. It is not intended to diagnose MRSA infection nor to guide or monitor treatment for MRSA infections. Performed at [facility], [address] [place], [place] [address]   MRSA PCR Screening     Status: None   Collection Time: 06/21/20  3:55 PM   Specimen: Nasal Mucosa; Nasopharyngeal  Result Value Ref Range Status   MRSA by PCR NEGATIVE NEGATIVE Final    Comment:        The GeneXpert MRSA Assay (FDA approved for NASAL specimens only), is one component of a comprehensive MRSA colonization surveillance program. It is not intended to diagnose MRSA infection nor to guide or monitor treatment for MRSA infections. Performed at [facility], [place] [address] [place], [place] [address]   CSF culture     Status: None   Collection Time: 06/22/20  1:31 PM   Specimen: PATH Cytology CSF; Cerebrospinal Fluid  Result Value Ref Range Status   Specimen Description CSF  Final   Special Requests NONE  Final   Gram Stain   Final    WBC PRESENT,BOTH PMN AND MONONUCLEAR NO ORGANISMS SEEN CYTOSPIN SMEAR    Culture   Final    NO GROWTH 3 DAYS Performed at [facility], [place] [address] [place], [place] [address]    Report Status 06/25/2020 FINAL  Final  Culture, fungus without smear     Status: None (Preliminary result)   Collection Time: 06/22/20  1:31 PM   Specimen: PATH Cytology CSF; Cerebrospinal Fluid  Result Value Ref Range Status   Specimen Description CSF  Final   Special Requests NONE  Final   Culture   Final    NO FUNGUS ISOLATED AFTER 5 DAYS Performed at [facility], [address], [place], [place] [address]    Report Status PENDING  Incomplete         Radiology Studies: DG Swallowing Func-Speech Pathology  Result Date: 06/27/2020 Objective Swallowing Evaluation: Type of Study: MBS-Modified Barium Swallow Study  Patient Details Name: YUYA DELLIS MRN: [id number] Date of Birth:  March 07, 1986 Today's Date: 06/27/2020 Time: SLP Start Time (ACUTE ONLY): 1336 -SLP Stop Time (ACUTE ONLY): 1356 SLP Time Calculation (min) (ACUTE ONLY): 20 min Past Medical History: Past Medical History: Diagnosis Date . Diabetes mellitus without complication [facility])  Past Surgical History: Past Surgical History: Procedure Laterality Date . EYE SURGERY   . RIGHT/LEFT HEART CATH AND CORONARY ANGIOGRAPHY N/A 05/25/2018  Procedure: RIGHT/LEFT HEART CATH AND CORONARY ANGIOGRAPHY;  Surgeon: Corey Skains, MD;  Location: [place] [facility] [facility] LAB;  Service: Cardiovascular;  Laterality: N/A; . TONSILLECTOMY   HPI: RUTLEDGE SHALLOW is a 35 y.o. male admitted with generalized weakness, malaise, nausea vomiting, nasal congestion/sneezing/cough. Found to be in shock, concern for sepis, acute renal failure and acute stroke. Intubated 1/29-2/1 after becoming difficult to arouse. MRI multiple scattered punctate acute to early subacute ischemic white matter infarcts involving the periventricular and deep white matter of both cerebral hemispheres, right greater than left. 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 PMH: type 1 diabetes, EF 20 to 25%, CKD stage IIIb, hypertension.  Chest x-ray showed severe cardiomegaly, pulmonary venous congestion, mild bilateral interstitial prominence (mild CHF versus pneumonitis cannot be excluded).  No data recorded Assessment / Plan / Recommendation CHL IP CLINICAL IMPRESSIONS 06/27/2020 Clinical Impression Pt exhibited oral dysphagia without laryngeal penetration or aspiration observed. 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 There were mild delays and holding of thin liquids. Mastication was mildly prolonged. Lingual residue spilled to pyriform sinuses with nectar. Once bolus reached oropharynx the strength and initiation of swallow mechanism was intact and effective to protect trachea. No residue post swallow. If  he takes sequential sips thin or mixes textures he could have episodes of airway intrusion. Recommend Dys 2 texture, thin, straws allowed, meds via tube (crush when tube removed). Recommend keeping Cortrak to supplement intake as is predicted he may need until oral intake is adequate. SLP Visit Diagnosis Dysphagia, oral phase (R13.11) Attention and concentration deficit following -- Frontal lobe and executive function deficit following -- Impact on safety and function Mild aspiration risk   CHL IP TREATMENT RECOMMENDATION 06/27/2020 Treatment Recommendations Therapy as outlined in treatment plan below   Prognosis 06/27/2020 Prognosis for Safe Diet Advancement Good Barriers to Reach Goals (No Data) Barriers/Prognosis Comment -- CHL IP DIET RECOMMENDATION 06/27/2020 SLP Diet Recommendations Dysphagia 2 (Fine chop) solids;Thin liquid Liquid Administration via Straw;Cup Medication Administration Via alternative means Compensations Minimize environmental distractions;Slow rate;Small sips/bites;Lingual sweep for clearance of pocketing Postural Changes Seated upright at 90 degrees   CHL IP OTHER RECOMMENDATIONS 06/27/2020 Recommended Consults -- Oral Care Recommendations Oral care BID Other Recommendations --   CHL IP FOLLOW UP RECOMMENDATIONS 06/27/2020 Follow up Recommendations Inpatient Rehab   CHL IP FREQUENCY AND DURATION 06/27/2020 Speech Therapy Frequency (ACUTE ONLY) min 2x/week Treatment Duration 2 weeks      CHL IP ORAL PHASE 06/27/2020 Oral Phase Impaired Oral - Pudding Teaspoon -- Oral - Pudding Cup -- Oral - Honey Teaspoon -- Oral - Honey Cup -- Oral - Nectar Teaspoon -- Oral - Nectar Cup Lingual/palatal residue Oral - Nectar Straw -- Oral - Thin Teaspoon --  Oral - Thin Cup Delayed oral transit Oral - Thin Straw Delayed oral transit Oral - Puree Delayed oral transit Oral - Mech Soft -- Oral - Regular Weak lingual manipulation Oral - Multi-Consistency -- Oral - Pill -- Oral Phase - Comment --  CHL IP PHARYNGEAL PHASE  06/27/2020 Pharyngeal Phase WFL Pharyngeal- Pudding Teaspoon -- Pharyngeal -- Pharyngeal- Pudding Cup -- Pharyngeal -- Pharyngeal- Honey Teaspoon -- Pharyngeal -- Pharyngeal- Honey Cup -- Pharyngeal -- Pharyngeal- Nectar Teaspoon -- Pharyngeal -- Pharyngeal- Nectar Cup WFL Pharyngeal -- Pharyngeal- Nectar Straw -- Pharyngeal -- Pharyngeal- Thin Teaspoon -- Pharyngeal -- Pharyngeal- Thin Cup WFL Pharyngeal -- Pharyngeal- Thin Straw WFL Pharyngeal -- Pharyngeal- Puree WFL Pharyngeal -- Pharyngeal- Mechanical Soft -- Pharyngeal -- Pharyngeal- Regular WFL Pharyngeal -- Pharyngeal- Multi-consistency -- Pharyngeal -- Pharyngeal- Pill -- Pharyngeal -- Pharyngeal Comment --  CHL IP CERVICAL ESOPHAGEAL PHASE 06/27/2020 Cervical Esophageal Phase WFL Pudding Teaspoon -- Pudding Cup -- Honey Teaspoon -- Honey Cup -- Nectar Teaspoon -- Nectar Cup -- Nectar Straw -- Thin Teaspoon -- Thin Cup -- Thin Straw -- Puree -- Mechanical Soft -- Regular -- Multi-consistency -- Pill -- Cervical Esophageal Comment -- [name] 06/27/2020, 4:34 PM  Orbie Pyo Litaker M.Ed Actor Pager [phone] Office [phone]             VAS [place] UPPER EXT VEIN MAPPING (PRE-OP AVF)  Result Date: 06/28/2020 UPPER EXTREMITY VEIN MAPPING  Indications: Pre-access. History: Renal failure.  Limitations: Patient with AMS tightening and holding arms to his sides - very              difficult exam Performing Technologist: Rogelia Rohrer  Examination Guidelines: A complete evaluation includes B-mode imaging, spectral Doppler, color Doppler, and power Doppler as needed of all accessible portions of each vessel. Bilateral testing is considered an integral part of a complete examination. Limited examinations for reoccurring indications may be performed as noted. +-----------------+-------------+----------+--------------+ Right Cephalic   Diameter (cm)Depth (cm)   Findings    +-----------------+-------------+----------+--------------+  Shoulder                                not visualized +-----------------+-------------+----------+--------------+ Prox upper arm                          not visualized +-----------------+-------------+----------+--------------+ Mid upper arm                           not visualized +-----------------+-------------+----------+--------------+ Dist upper arm                          not visualized +-----------------+-------------+----------+--------------+ Antecubital fossa                       not visualized +-----------------+-------------+----------+--------------+ Prox forearm                            not visualized +-----------------+-------------+----------+--------------+ Mid forearm                             not visualized +-----------------+-------------+----------+--------------+ Dist forearm                            not visualized +-----------------+-------------+----------+--------------+ Wrist  not visualized +-----------------+-------------+----------+--------------+ +-----------------+-------------+----------+---------+ Right Basilic    Diameter (cm)Depth (cm)Findings  +-----------------+-------------+----------+---------+ Prox upper arm       0.41                         +-----------------+-------------+----------+---------+ Mid upper arm        0.48                         +-----------------+-------------+----------+---------+ Dist upper arm       0.43                         +-----------------+-------------+----------+---------+ Antecubital fossa    0.49               branching +-----------------+-------------+----------+---------+ Prox forearm         0.35               branching +-----------------+-------------+----------+---------+ Mid forearm          0.12               branching +-----------------+-------------+----------+---------+ Distal forearm       0.12                          +-----------------+-------------+----------+---------+ Wrist                0.12                         +-----------------+-------------+----------+---------+ Subcutaneous edema from [facility] fossa to mid/distal forearm. +-----------------+-------------+----------+---------+ Left Cephalic    Diameter (cm)Depth (cm)Findings  +-----------------+-------------+----------+---------+ Shoulder             0.17        0.75             +-----------------+-------------+----------+---------+ Prox upper arm       0.24        0.49             +-----------------+-------------+----------+---------+ Mid upper arm        0.30        0.38             +-----------------+-------------+----------+---------+ Dist upper arm       0.24        0.37             +-----------------+-------------+----------+---------+ Antecubital fossa    0.46        0.48   branching +-----------------+-------------+----------+---------+ Prox forearm         0.30        0.63             +-----------------+-------------+----------+---------+ Mid forearm          0.29        0.62             +-----------------+-------------+----------+---------+ Dist forearm         0.27        0.31             +-----------------+-------------+----------+---------+ Wrist                0.10        0.31             +-----------------+-------------+----------+---------+ +-----------------+-------------+----------+--------------------------+ Left Basilic     Diameter (cm)Depth (cm)         Findings          +-----------------+-------------+----------+--------------------------+  Prox upper arm       0.44                                          +-----------------+-------------+----------+--------------------------+ Mid upper arm        0.38                                          +-----------------+-------------+----------+--------------------------+ Dist upper arm       0.31                        branching          +-----------------+-------------+----------+--------------------------+ Antecubital fossa    0.50                       branching          +-----------------+-------------+----------+--------------------------+ Prox forearm         0.23                       branching          +-----------------+-------------+----------+--------------------------+ Mid forearm          0.17               Age Indeterminate Thrombus +-----------------+-------------+----------+--------------------------+ Distal forearm       0.13                                          +-----------------+-------------+----------+--------------------------+ Wrist                                         not visualized       +-----------------+-------------+----------+--------------------------+ Summary: Right: Subcutaneous fluid around area of IV in AC fossa. Left: Age indeterminate thrombus in mid forearm (basilic vein). *See table(s) above for measurements and observations.  Diagnosing physician: Monica Martinez MD Electronically signed by Monica Martinez MD on 06/28/2020 at 12:32:47 PM.    Final         Scheduled Meds: . aspirin  81 mg Per NG tube Daily  . carvedilol  25 mg Per Tube BID WC  . chlorhexidine  15 mL Mouth Rinse BID  . Chlorhexidine Gluconate Cloth  6 each Topical Q0600  . clopidogrel  75 mg Per NG tube Daily  . darbepoetin (ARANESP) injection - DIALYSIS  60 mcg Intravenous Q Sat-HD  . famotidine  20 mg Per Tube QHS  . feeding supplement (OSMOLITE 1.5 CAL)  1,000 mL Per Tube Q24H  . feeding supplement (PROSource TF)  90 mL Per Tube BID  . heparin injection (subcutaneous)  5,000 Units Subcutaneous Q8H  . [START ON 06/29/2020] influenza vac split quadrivalent PF  0.5 mL Intramuscular Tomorrow-1000  . insulin aspart  0-9 Units Subcutaneous Q4H  . mouth rinse  15 mL Mouth Rinse q12n4p   Continuous Infusions: . sodium chloride    . acyclovir 390 mg (06/28/20 0508)  .  levETIRAcetam 1,000 mg (06/27/20 2325)     LOS: 7 days    Time spent: 30 minutes  Barb Merino, MD [facility] Pager [phone]

## 2020-06-29 ENCOUNTER — Inpatient Hospital Stay (HOSPITAL_COMMUNITY): Payer: BC Managed Care – PPO

## 2020-06-29 DIAGNOSIS — I5023 Acute on chronic systolic (congestive) heart failure: Secondary | ICD-10-CM

## 2020-06-29 LAB — GLUCOSE, CAPILLARY
Glucose-Capillary: 105 mg/dL — ABNORMAL HIGH (ref 70–99)
Glucose-Capillary: 117 mg/dL — ABNORMAL HIGH (ref 70–99)
Glucose-Capillary: 139 mg/dL — ABNORMAL HIGH (ref 70–99)
Glucose-Capillary: 142 mg/dL — ABNORMAL HIGH (ref 70–99)
Glucose-Capillary: 167 mg/dL — ABNORMAL HIGH (ref 70–99)

## 2020-06-29 LAB — RENAL FUNCTION PANEL
Albumin: 1.7 g/dL — ABNORMAL LOW (ref 3.5–5.0)
Anion gap: 8 (ref 5–15)
BUN: 23 mg/dL — ABNORMAL HIGH (ref 6–20)
CO2: 27 mmol/L (ref 22–32)
Calcium: 8.1 mg/dL — ABNORMAL LOW (ref 8.9–10.3)
Chloride: 105 mmol/L (ref 98–111)
Creatinine, Ser: 4.22 mg/dL — ABNORMAL HIGH (ref 0.61–1.24)
GFR, Estimated: 18 mL/min — ABNORMAL LOW (ref >60)
Glucose, Bld: 112 mg/dL — ABNORMAL HIGH (ref 70–99)
Phosphorus: 3.5 mg/dL (ref 2.5–4.6)
Potassium: 3.5 mmol/L (ref 3.5–5.1)
Sodium: 140 mmol/L (ref 135–145)

## 2020-06-29 LAB — ECHOCARDIOGRAM LIMITED
S' Lateral: 5.1 cm
Weight: 3647.29 oz

## 2020-06-29 LAB — MAGNESIUM: Magnesium: 2.1 mg/dL (ref 1.7–2.4)

## 2020-06-29 MED ORDER — CLOPIDOGREL BISULFATE 75 MG PO TABS
75.0000 mg | ORAL_TABLET | Freq: Every day | ORAL | Status: DC
  Administered 2020-06-30 – 2020-07-04 (×5): 75 mg via ORAL
  Filled 2020-06-29 (×5): qty 1

## 2020-06-29 MED ORDER — LEVETIRACETAM 500 MG PO TABS
500.0000 mg | ORAL_TABLET | Freq: Two times a day (BID) | ORAL | Status: DC
  Administered 2020-06-29 – 2020-07-04 (×11): 500 mg via ORAL
  Filled 2020-06-29 (×11): qty 1

## 2020-06-29 MED ORDER — ASPIRIN 81 MG PO CHEW
81.0000 mg | CHEWABLE_TABLET | Freq: Every day | ORAL | Status: DC
Start: 2020-06-30 — End: 2020-07-04
  Administered 2020-06-30 – 2020-07-04 (×5): 81 mg via ORAL
  Filled 2020-06-29 (×5): qty 1

## 2020-06-29 MED ORDER — PERFLUTREN LIPID MICROSPHERE
1.0000 mL | INTRAVENOUS | Status: AC | PRN
  Administered 2020-06-29: 2 mL via INTRAVENOUS
  Filled 2020-06-29: qty 10

## 2020-06-29 MED ORDER — LEVETIRACETAM 500 MG PO TABS
1000.0000 mg | ORAL_TABLET | Freq: Two times a day (BID) | ORAL | Status: DC
Start: 2020-06-29 — End: 2020-06-29

## 2020-06-29 MED ORDER — CARVEDILOL 12.5 MG PO TABS
25.0000 mg | ORAL_TABLET | Freq: Two times a day (BID) | ORAL | Status: DC
  Administered 2020-06-29 – 2020-07-04 (×10): 25 mg via ORAL
  Filled 2020-06-29 (×10): qty 2

## 2020-06-29 MED ORDER — INSULIN ASPART 100 UNIT/ML ~~LOC~~ SOLN
0.0000 [IU] | Freq: Three times a day (TID) | SUBCUTANEOUS | Status: DC
  Administered 2020-06-29: 1 [IU] via SUBCUTANEOUS
  Administered 2020-06-29: 2 [IU] via SUBCUTANEOUS
  Administered 2020-06-29 – 2020-06-30 (×3): 1 [IU] via SUBCUTANEOUS
  Administered 2020-06-30 – 2020-07-01 (×2): 2 [IU] via SUBCUTANEOUS
  Administered 2020-07-01 (×3): 1 [IU] via SUBCUTANEOUS

## 2020-06-29 MED ORDER — FAMOTIDINE 40 MG/5ML PO SUSR
20.0000 mg | Freq: Every day | ORAL | Status: DC
  Administered 2020-06-29: 20 mg via ORAL
  Filled 2020-06-29: qty 2.5

## 2020-06-29 NOTE — Progress Notes (Signed)
Pharmacy Antibiotic Note  Adrian Evans is a 35 y.o. male admitted on 06/21/2020 with meningitis. Pharmacy has been consulted for acyclovir dosing. Patient is Day 9 of 10. HSV PCR not sent initially, but now in process.  CRRT stopped 2/1 and transitioned to iHD  Plan: Acyclovir '5mg'$ /kg per IBW Q24 hours while on HD  F/u HSV PCR sent on 2/5 Monitor cultures, clinical status, renal fxn    Weight: 103.4 kg (227 lb 15.3 oz)  Temp (24hrs), Avg:97.8 F (36.6 C), Min:97.5 F (36.4 C), Max:98.4 F (36.9 C)  Recent Labs  Lab 06/22/20 1554 06/23/20 0337 06/24/20 1126 06/25/20 0537 06/25/20 1920 06/25/20 2000 06/26/20 0335 06/27/20 0500 06/28/20 0602 06/29/20 0416  WBC  --    < > 10.3 11.0* 10.5  --  10.9* 10.3  --   --   CREATININE 8.61*   < > 3.44* 4.76*  --  5.60* 5.88* 4.86* 5.87* 4.22*  VANCORANDOM 22  --   --   --   --   --   --   --   --   --    < > = values in this interval not displayed.      No Known Allergies  Antimicrobials this admission: Ceftriaxone 1/28 >> 2/3 Vancomycin 1/28 >> 2/3 Ampicillin 1/28 >>2/3 Acyclovir 1/29 >>   Microbiology results: 1/29 COVID neg 1/30 Strep pneumo>>negative 1/30 CSF counts > glucose wnl, protein high, WBC neg  2/5 HSV PCR >>  1/30 CSF fungal smear >> no fungus x 3 days to date 1/30 CSF cx: negative 1/30 Varicella IgG >> positive 1/30 Varicella PCR >> pending  Talesha Ellithorpe A. Levada Dy, PharmD, BCPS, FNKF Clinical Pharmacist [place] Please utilize [facility] for appropriate phone number to reach the unit pharmacist ([place])   06/29/2020 8:00 AM

## 2020-06-29 NOTE — Progress Notes (Signed)
PROGRESS NOTE    Adrian Evans  [id number] DOB: 29-Apr-1986 DOA: 06/21/2020 PCP: Lenard Simmer, MD    Brief Narrative:  Patient is 35 year old gentleman who has history of type 1 diabetes on insulin, diabetic retinopathy, hypertension, stage IIIb chronic kidney disease who presented to [facility] on 1/26 with nausea, vomiting, malaise and weakness.  Patient went to work in the morning, he did not feel well so taken to the emergency room.  Wife reports that he had poor appetite and felt body ache for the last few days. Significant events: 1/26 admitted at [facility] in shock felt to be hypovolemic  1/27 cards consult, nephrology consult. Surgery and vascular consults for HD access 1/28 hypotensive and transferred to ICU. AMS, sz-like episodes During tele neuro eval. Rec HD 1/29 Keppra, meningitis coverage, [facility]G and LP 1/30 LP performed by IR, MRI showed multiple cerebral infarcts1/26 admitted, in shock felt to be hypovolemic   1/28 CT H no acute intracranial abnormality -- ill defined low density focus in L thalamus 1/28 CTA h/n> no LVO  2/5 significant improvement in mental status.  Able to swallow.  1/29 MRI Brain 1. Multiple scattered punctate acute to early subacute ischemic white matter infarcts involving the periventricular and deep white matter of both cerebral hemispheres, right greater than left. No associated hemorrhage or mass effect. 2. 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   Assessment & Plan:   Active Problems:   Acute encephalopathy   Acute cerebral infarction ([place])   Diabetes mellitus type I ([place])   Stage 3 chronic kidney disease ([place])   Essential hypertension   Chronic systolic congestive heart failure (HCC)   Dysphagia, post-stroke  Acute encephalopathy, multifactorial.  Also suspected viral encephalitis. Suspected secondary to uremia, multiple stroke.  Mental  status normalized now.  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 Negative for bacterial meningitis. Negative for autoimmune meningitis.  QuantiFERON and HIV negative.  Hepatitis panel negative.  Covid negative.  Streptococcus pneumoniae antigen negative.  Autoimmune work-up negative. Varicella-zoster PCR negative. No evidence of bacterial infection, antibiotics discontinued. 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  Apparently, labs were not collected on 1/30.  He is completing 10 days of acyclovir empirically.  We were able to locate his CSF sample from last week and sent for HSV 1 and 2 PCR.    Seizure disorder: Reported seizure like episodes more likely uremic symptoms.  Seen by neurology.  Currently remains on Keppra. 3 days video EEG did not demonstrate any seizures. Will discuss with neurology whether he can be tapered off Keppra.    Shock: Suspect hypovolemic shock.  Transiently on vasopressors.  Now off vasopressors.  Normalized.   Acute stroke: Acute multiple territory stroke thought to be secondary to hypotension and hypoperfusion. Encephalopathic, no focal deficit.  Aggressive rehab. Refer to CIR. Aspirin and plavix for 3 weeks and aspirin alone as suggested by neurology. LDL 44, no statin needed  A1c 7.4, on insulin.  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  Last dialysis 2/5. Has right IJ temporary catheter. Significant urinary retention, status post Foley catheter placement,  More than 2 L urine last 24 hours. Patient is willing to do voiding trial.  Discontinue Foley catheter.  Acute hypoxemic respiratory failure secondary to altered mental status: Extubated to nasal cannula oxygen on 2/1. Now on room air.  Acute on chronic combined heart failure: Previous EF 45%.  TEE with ejection fraction of 20 to  25%. Suspect acute cardiomyopathy stress. on coreg. Will not tolerate ACE or ARB Will repeat limited echocardiogram after clinical improvement. Avoiding diuretics with anticipating improvement in renal functions. Fluid balance achieved by dialysis.  Type 1 diabetes: Currently only on sliding scale insulin.  Stable.  Diet:  Patient able to eat all his meals.  Discontinue core track feeding. Discontinue Foley catheter and give voiding trial. Stable to transfer to acute inpatient rehab, may be followed for renal recovery at rehab. Will check limited echo to assess EF     DVT prophylaxis: SCDs Start: 06/21/20 1551   Code Status: Full code Family Communication: No family at bedside today. Disposition Plan: Status is: Inpatient  Remains inpatient appropriate because:Persistent severe electrolyte disturbances, Altered mental status and Inpatient level of care appropriate due to severity of illness   Dispo: The patient is from: Home              Anticipated d/c is to: CIR              Anticipated d/c date is: Architectural technologist.              Patient currently is medically stable to transfer to rehab.   Difficult to place patient No   Consultants:   PCCM  Neurology  Surgery  Procedures:   Multiple procedures.  Now on hemodialysis.  Antimicrobials:  1/29 acyclovir>  1/29 ampicillin> 2/3 1/29 rocephin> 2/3 1/29 vanc> 2/3   Subjective: This patient was seen and examined.  He was on room air.  He was conversant.  Alert oriented x4.  He asked me to see if we can take his core track and Foley catheter out.  He is able to repeat. He was very excited to know that his kidneys are recovering and had more than 2 L urine last 24 hours.  Looking forward for rehab.  Objective: Vitals:   06/29/20 0435 06/29/20 0500 06/29/20 0800 06/29/20 1114  BP: 134/80  129/79 119/78  Pulse: 76  80 76  Resp: '18  18 18  '$ Temp: 98.4 F (36.9 C)  98 F (36.7 C) 97.7 F (36.5 C)  TempSrc: Oral  Oral  Oral  SpO2: 95%  100% 100%  Weight:  103.4 kg      Intake/Output Summary (Last 24 hours) at 06/29/2020 1116 Last data filed at 06/29/2020 0500 Gross per 24 hour  Intake 340 ml  Output 1650 ml  Net -1310 ml   Filed Weights   06/26/20 0730 06/26/20 1005 06/29/20 0500  Weight: 103.9 kg 103.9 kg 103.4 kg    Examination:  General exam: Appears calm , comfortable and conversant. Respiratory system: Clear to auscultation. Respiratory effort normal. No added sounds. Cardiovascular system: S1 & S2 heard, RRR. No JVD, murmurs, rubs, gallops or clicks. No pedal edema. Gastrointestinal system: soft and non tender. BS present.  Central nervous system: Patient alert awake and oriented.  Able to have clear conversation, speech is clear. Extremities: generalized weakness. Foley catheter with clear urine.     Data Reviewed: I have personally reviewed following labs and imaging studies  CBC: Recent Labs  Lab 06/23/20 0337 06/24/20 1126 06/25/20 0537 06/25/20 1920 06/26/20 0335 06/27/20 0500  WBC 10.2 10.3 11.0* 10.5 10.9* 10.3  NEUTROABS 8.4* 9.2* 7.9*  --  8.0* 7.4  HGB 10.7* 10.1* 9.2* 9.1* 9.2* 8.5*  HCT 30.2* 29.0* 27.1* 26.6* 27.0* 24.5*  MCV  89.1 89.8 92.8 91.7 94.7 92.5  PLT 331 325 317 312 286 [id number]   Basic Metabolic Panel: Recent Labs  Lab 06/25/20 0537 06/25/20 2000 06/26/20 0335 06/27/20 0500 06/28/20 0602 06/29/20 0416  NA 135 137 136 138 141 140  K 3.8 3.9 4.6 3.6 3.6 3.5  CL 101 102 103 102 107 105  CO2 22 22 18* '24 25 27  '$ GLUCOSE 133* 190* 197* 230* 217* 112*  BUN 33* 39* 44* 33* 40* 23*  CREATININE 4.76* 5.60* 5.88* 4.86* 5.87* 4.22*  CALCIUM 8.1* 8.4* 8.4* 8.2* 8.3* 8.1*  MG 2.4  --  2.5* 2.3 2.3 2.1  PHOS 4.8* 5.3* 5.2* 3.9 4.4 3.5   GFR: Estimated Creatinine Clearance: 29.8 mL/min (A) (by C-G formula based on SCr of 4.22 mg/dL (H)). Liver Function Tests: Recent Labs  Lab 06/25/20 2000 06/26/20 0335 06/27/20 0500 06/28/20 0602 06/29/20 0416   ALBUMIN 1.7* 1.8* 1.7* 1.6* 1.7*   No results for input(s): LIPASE, AMYLASE in the last 168 hours. No results for input(s): AMMONIA in the last 168 hours. Coagulation Profile: No results for input(s): INR, PROTIME in the last 168 hours. Cardiac Enzymes: No results for input(s): CKTOTAL, CKMB, CKMBINDEX, TROPONINI in the last 168 hours. BNP (last 3 results) No results for input(s): PROBNP in the last 8760 hours. HbA1C: No results for input(s): HGBA1C in the last 72 hours. CBG: Recent Labs  Lab 06/28/20 1648 06/28/20 2036 06/28/20 2353 06/29/20 0434 06/29/20 0803  GLUCAP 125* 87 107* 105* 117*   Lipid Profile: Recent Labs    06/28/20 0602  TRIG 68   Thyroid Function Tests: No results for input(s): TSH, T4TOTAL, FREET4, T3FREE, THYROIDAB in the last 72 hours. Anemia Panel: No results for input(s): VITAMINB12, FOLATE, FERRITIN, TIBC, IRON, RETICCTPCT in the last 72 hours. Sepsis Labs: No results for input(s): PROCALCITON, LATICACIDVEN in the last 168 hours.  Recent Results (from the past 240 hour(s))  MRSA PCR Screening     Status: None   Collection Time: 06/20/20  1:19 PM   Specimen: Nasal Mucosa; Nasopharyngeal  Result Value Ref Range Status   MRSA by PCR NEGATIVE NEGATIVE Final    Comment:        The GeneXpert MRSA Assay (FDA approved for NASAL specimens only), is one component of a comprehensive MRSA colonization surveillance program. It is not intended to diagnose MRSA infection nor to guide or monitor treatment for MRSA infections. Performed at [facility], [address] [place], [place] [address]   MRSA PCR Screening     Status: None   Collection Time: 06/21/20  3:55 PM   Specimen: Nasal Mucosa; Nasopharyngeal  Result Value Ref Range Status   MRSA by PCR NEGATIVE NEGATIVE Final    Comment:        The GeneXpert MRSA Assay (FDA approved for NASAL specimens only), is one component of a comprehensive MRSA colonization surveillance program.  It is not intended to diagnose MRSA infection nor to guide or monitor treatment for MRSA infections. Performed at [facility], [place] [address] [place], [place] [address]   CSF culture     Status: None   Collection Time: 06/22/20  1:31 PM   Specimen: PATH Cytology CSF; Cerebrospinal Fluid  Result Value Ref Range Status   Specimen Description CSF  Final   Special Requests NONE  Final   Gram Stain   Final    WBC PRESENT,BOTH PMN AND MONONUCLEAR NO ORGANISMS SEEN CYTOSPIN SMEAR    Culture  Final    NO GROWTH 3 DAYS Performed at [facility], [place] [address], [place], [place] [address]    Report Status 06/25/2020 FINAL  Final  Culture, fungus without smear     Status: None (Preliminary result)   Collection Time: 06/22/20  1:31 PM   Specimen: PATH Cytology CSF; Cerebrospinal Fluid  Result Value Ref Range Status   Specimen Description CSF  Final   Special Requests NONE  Final   Culture   Final    NO FUNGUS ISOLATED AFTER 5 DAYS Performed at [facility], [place] [address], [place], [place] [address]    Report Status PENDING  Incomplete         Radiology Studies: DG Swallowing Func-Speech Pathology  Result Date: 06/27/2020 Objective Swallowing Evaluation: Type of Study: MBS-Modified Barium Swallow Study  Patient Details Name: Adrian Evans MRN: [id number] Date of Birth: [birth year] Today's Date: 06/27/2020 Time: SLP Start Time (ACUTE ONLY): 1336 -SLP Stop Time (ACUTE ONLY): 1356 SLP Time Calculation (min) (ACUTE ONLY): 20 min Past Medical History: Past Medical History: Diagnosis Date . Diabetes mellitus without complication [facility])  Past Surgical History: Past Surgical History: Procedure Laterality Date . EYE SURGERY   . RIGHT/LEFT HEART CATH AND CORONARY ANGIOGRAPHY N/A 05/25/2018  Procedure: RIGHT/LEFT HEART CATH AND CORONARY ANGIOGRAPHY;  Surgeon: Corey Skains, MD;  Location: [place] [facility];  Service: Cardiovascular;  Laterality: N/A; . TONSILLECTOMY   HPI:  TERRICO ZURN is a 35 y.o. male admitted with generalized weakness, malaise, nausea vomiting, nasal congestion/sneezing/cough. Found to be in shock, concern for sepis, acute renal failure and acute stroke. Intubated 1/29-2/1 after becoming difficult to arouse. MRI multiple scattered punctate acute to early subacute ischemic white matter infarcts involving the periventricular and deep white matter of both cerebral hemispheres, right greater than left. 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 PMH: type 1 diabetes, EF 20 to 25%, CKD stage IIIb, hypertension.  Chest x-ray showed severe cardiomegaly, pulmonary venous congestion, mild bilateral interstitial prominence (mild CHF versus pneumonitis cannot be excluded).  No data recorded Assessment / Plan / Recommendation CHL IP CLINICAL IMPRESSIONS 06/27/2020 Clinical Impression Pt exhibited oral dysphagia without laryngeal penetration or aspiration observed. 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 There were mild delays and holding of thin liquids. Mastication was mildly prolonged. Lingual residue spilled to pyriform sinuses with nectar. Once bolus reached oropharynx the strength and initiation of swallow mechanism was intact and effective to protect trachea. No residue post swallow. If he takes sequential sips thin or mixes textures he could have episodes of airway intrusion. Recommend Dys 2 texture, thin, straws allowed, meds via tube (crush when tube removed). Recommend keeping Cortrak to supplement intake as is predicted he may need until oral intake is adequate. SLP Visit Diagnosis Dysphagia, oral phase (R13.11) Attention and concentration deficit following -- Frontal lobe and executive function deficit following -- Impact on safety and function Mild aspiration risk   CHL IP TREATMENT RECOMMENDATION 06/27/2020 Treatment Recommendations Therapy as outlined in treatment plan  below   Prognosis 06/27/2020 Prognosis for Safe Diet Advancement Good Barriers to Reach Goals (No Data) Barriers/Prognosis Comment -- CHL IP DIET RECOMMENDATION 06/27/2020 SLP Diet Recommendations Dysphagia 2 (Fine chop) solids;Thin liquid Liquid Administration via Straw;Cup Medication Administration Via alternative means Compensations Minimize environmental distractions;Slow rate;Small sips/bites;Lingual sweep for clearance of pocketing Postural Changes Seated upright at 90 degrees   CHL IP OTHER RECOMMENDATIONS  06/27/2020 Recommended Consults -- Oral Care Recommendations Oral care BID Other Recommendations --   CHL IP FOLLOW UP RECOMMENDATIONS 06/27/2020 Follow up Recommendations Inpatient Rehab   CHL IP FREQUENCY AND DURATION 06/27/2020 Speech Therapy Frequency (ACUTE ONLY) min 2x/week Treatment Duration 2 weeks      CHL IP ORAL PHASE 06/27/2020 Oral Phase Impaired Oral - Pudding Teaspoon -- Oral - Pudding Cup -- Oral - Honey Teaspoon -- Oral - Honey Cup -- Oral - Nectar Teaspoon -- Oral - Nectar Cup Lingual/palatal residue Oral - Nectar Straw -- Oral - Thin Teaspoon -- Oral - Thin Cup Delayed oral transit Oral - Thin Straw Delayed oral transit Oral - Puree Delayed oral transit Oral - Mech Soft -- Oral - Regular Weak lingual manipulation Oral - Multi-Consistency -- Oral - Pill -- Oral Phase - Comment --  CHL IP PHARYNGEAL PHASE 06/27/2020 Pharyngeal Phase WFL Pharyngeal- Pudding Teaspoon -- Pharyngeal -- Pharyngeal- Pudding Cup -- Pharyngeal -- Pharyngeal- Honey Teaspoon -- Pharyngeal -- Pharyngeal- Honey Cup -- Pharyngeal -- Pharyngeal- Nectar Teaspoon -- Pharyngeal -- Pharyngeal- Nectar Cup WFL Pharyngeal -- Pharyngeal- Nectar Straw -- Pharyngeal -- Pharyngeal- Thin Teaspoon -- Pharyngeal -- Pharyngeal- Thin Cup WFL Pharyngeal -- Pharyngeal- Thin Straw WFL Pharyngeal -- Pharyngeal- Puree WFL Pharyngeal -- Pharyngeal- Mechanical Soft -- Pharyngeal -- Pharyngeal- Regular WFL Pharyngeal -- Pharyngeal- Multi-consistency --  Pharyngeal -- Pharyngeal- Pill -- Pharyngeal -- Pharyngeal Comment --  CHL IP CERVICAL ESOPHAGEAL PHASE 06/27/2020 Cervical Esophageal Phase WFL Pudding Teaspoon -- Pudding Cup -- Honey Teaspoon -- Honey Cup -- Nectar Teaspoon -- Nectar Cup -- Nectar Straw -- Thin Teaspoon -- Thin Cup -- Thin Straw -- Puree -- Mechanical Soft -- Regular -- Multi-consistency -- Pill -- Cervical Esophageal Comment -- [name] 06/27/2020, 4:34 PM  Orbie Pyo Litaker M.Ed Actor Pager [phone] Office [phone]             VAS [place] UPPER EXT VEIN MAPPING (PRE-OP AVF)  Result Date: 06/28/2020 UPPER EXTREMITY VEIN MAPPING  Indications: Pre-access. History: Renal failure.  Limitations: Patient with AMS tightening and holding arms to his sides - very              difficult exam Performing Technologist: Rogelia Rohrer  Examination Guidelines: A complete evaluation includes B-mode imaging, spectral Doppler, color Doppler, and power Doppler as needed of all accessible portions of each vessel. Bilateral testing is considered an integral part of a complete examination. Limited examinations for reoccurring indications may be performed as noted. +-----------------+-------------+----------+--------------+ Right Cephalic   Diameter (cm)Depth (cm)   Findings    +-----------------+-------------+----------+--------------+ Shoulder                                not visualized +-----------------+-------------+----------+--------------+ Prox upper arm                          not visualized +-----------------+-------------+----------+--------------+ Mid upper arm                           not visualized +-----------------+-------------+----------+--------------+ Dist upper arm                          not visualized +-----------------+-------------+----------+--------------+ Antecubital fossa                       not visualized +-----------------+-------------+----------+--------------+  Prox  forearm                            not visualized +-----------------+-------------+----------+--------------+ Mid forearm                             not visualized +-----------------+-------------+----------+--------------+ Dist forearm                            not visualized +-----------------+-------------+----------+--------------+ Wrist                                   not visualized +-----------------+-------------+----------+--------------+ +-----------------+-------------+----------+---------+ Right Basilic    Diameter (cm)Depth (cm)Findings  +-----------------+-------------+----------+---------+ Prox upper arm       0.41                         +-----------------+-------------+----------+---------+ Mid upper arm        0.48                         +-----------------+-------------+----------+---------+ Dist upper arm       0.43                         +-----------------+-------------+----------+---------+ Antecubital fossa    0.49               branching +-----------------+-------------+----------+---------+ Prox forearm         0.35               branching +-----------------+-------------+----------+---------+ Mid forearm          0.12               branching +-----------------+-------------+----------+---------+ Distal forearm       0.12                         +-----------------+-------------+----------+---------+ Wrist                0.12                         +-----------------+-------------+----------+---------+ Subcutaneous edema from AC fossa to mid/distal forearm. +-----------------+-------------+----------+---------+ Left Cephalic    Diameter (cm)Depth (cm)Findings  +-----------------+-------------+----------+---------+ Shoulder             0.17        0.75             +-----------------+-------------+----------+---------+ Prox upper arm       0.24        0.49              +-----------------+-------------+----------+---------+ Mid upper arm        0.30        0.38             +-----------------+-------------+----------+---------+ Dist upper arm       0.24        0.37             +-----------------+-------------+----------+---------+ Antecubital fossa    0.46        0.48   branching +-----------------+-------------+----------+---------+ Prox forearm         0.30        0.63             +-----------------+-------------+----------+---------+  Mid forearm          0.29        0.62             +-----------------+-------------+----------+---------+ Dist forearm         0.27        0.31             +-----------------+-------------+----------+---------+ Wrist                0.10        0.31             +-----------------+-------------+----------+---------+ +-----------------+-------------+----------+--------------------------+ Left Basilic     Diameter (cm)Depth (cm)         Findings          +-----------------+-------------+----------+--------------------------+ Prox upper arm       0.44                                          +-----------------+-------------+----------+--------------------------+ Mid upper arm        0.38                                          +-----------------+-------------+----------+--------------------------+ Dist upper arm       0.31                       branching          +-----------------+-------------+----------+--------------------------+ Antecubital fossa    0.50                       branching          +-----------------+-------------+----------+--------------------------+ Prox forearm         0.23                       branching          +-----------------+-------------+----------+--------------------------+ Mid forearm          0.17               Age Indeterminate Thrombus +-----------------+-------------+----------+--------------------------+ Distal forearm       0.13                                           +-----------------+-------------+----------+--------------------------+ Wrist                                         not visualized       +-----------------+-------------+----------+--------------------------+ Summary: Right: Subcutaneous fluid around area of IV in AC fossa. Left: Age indeterminate thrombus in mid forearm (basilic vein). *See table(s) above for measurements and observations.  Diagnosing physician: Monica Martinez MD Electronically signed by Monica Martinez MD on 06/28/2020 at 12:32:47 PM.    Final         Scheduled Meds: . [START ON 06/30/2020] aspirin  81 mg Oral Daily  . carvedilol  25 mg Oral BID WC  . chlorhexidine  15 mL Mouth Rinse BID  . Chlorhexidine Gluconate Cloth  6 each Topical Q0600  . [START ON 06/30/2020] clopidogrel  75 mg  Oral Daily  . darbepoetin (ARANESP) injection - DIALYSIS  60 mcg Intravenous Q Sat-HD  . famotidine  20 mg Oral QHS  . influenza vac split quadrivalent PF  0.5 mL Intramuscular Tomorrow-1000  . insulin aspart  0-9 Units Subcutaneous TID AC & HS  . levETIRAcetam  1,000 mg Oral BID  . mouth rinse  15 mL Mouth Rinse q12n4p   Continuous Infusions: . sodium chloride    . acyclovir 390 mg (06/29/20 0546)     LOS: 8 days    Time spent: 30 minutes    Barb Merino, MD [facility] Pager [phone]

## 2020-06-29 NOTE — Progress Notes (Signed)
  Echocardiogram 2D Echocardiogram has been performed.  Adrian Evans 06/29/2020, 2:57 PM

## 2020-06-29 NOTE — Progress Notes (Signed)
Patient ID: Adrian Evans, male   DOB: [birth year], 35 y.o.   MRN: [id number] [facility] Progress Note   Assessment/ Plan:   1. Acute kidney Injury on chronic kidney disease stage IV: Baseline creatinine suspected to be in the mid 3 range (underlying CKD likely from DM) and presented to the hospital with acute kidney injury in the setting of cardiogenic versus hypovolemic shock and ATN.  Initially underwent CRRT and was transitioned to intermittent hemodialysis after becoming more clinically/hemodynamically stable-he underwent hemodialysis yesterday primarily for clearance of azotemia to try and make a positive impact on his encephalopathy.  His urine output is picking up remarkably and I suspect renal recovery is underway to a point where he will be able to be discharged home off of dialysis to continue outpatient follow-up with nephrology for CKD stage IV. 2.  Acute metabolic encephalopathy: Suspected to be multifactorial in origin with severe azotemia/uremia but patient also noted to have had multiple scattered punctate cerebellar infarcts on MRI raising some concern. 3.  Congestive heart failure with reduced ejection fraction: 4.  Anemia: Secondary to chronic kidney disease and exacerbated by acute/critical illness 5.  Secondary hyperparathyroidism: Calcium and phosphorus level within acceptable limits; continue to monitor with sequential labs.  Subjective:   Without acute events overnight, tolerated dialysis without problems.   Objective:   BP 129/79 (BP Location: Left Arm)   Pulse 80   Temp 98 F (36.7 C) (Oral)   Resp 18   Wt 103.4 kg   SpO2 100%   BMI 30.92 kg/m   Intake/Output Summary (Last 24 hours) at 06/29/2020 0840 Last data filed at 06/29/2020 0500 Gross per 24 hour  Intake 460 ml  Output 2100 ml  Net -1640 ml   Weight change:   Physical Exam: Gen: Awake, alert, more communicative than yesterday and responding appropriately to questions. CVS: Pulse regular  rhythm, normal rate, S1 and S2 normal Resp: There to auscultation bilaterally, no rales/rhonchi Abd: Soft, flat, nontender, bowel sounds normal Ext: No lower extremity edema  Imaging: DG Swallowing Func-Speech Pathology  Result Date: 06/27/2020 Objective Swallowing Evaluation: Type of Study: MBS-Modified Barium Swallow Study  Patient Details Name: [name] MRN: [id number] Date of Birth: Jan 11, 1986 Today's Date: 06/27/2020 Time: SLP Start Time (ACUTE ONLY): 1336 -SLP Stop Time (ACUTE ONLY): 1356 SLP Time Calculation (min) (ACUTE ONLY): 20 min Past Medical History: Past Medical History: Diagnosis Date . Diabetes mellitus without complication [facility])  Past Surgical History: Past Surgical History: Procedure Laterality Date . EYE SURGERY   . RIGHT/LEFT HEART CATH AND CORONARY ANGIOGRAPHY N/A 05/25/2018  Procedure: RIGHT/LEFT HEART CATH AND CORONARY ANGIOGRAPHY;  Surgeon: Corey Skains, MD;  Location: [facility];  Service: Cardiovascular;  Laterality: N/A; . TONSILLECTOMY   HPI: [name] is a 35 y.o. male admitted with generalized weakness, malaise, nausea vomiting, nasal congestion/sneezing/cough. Found to be in shock, concern for sepis, acute renal failure and acute stroke. Intubated 1/29-2/1 after becoming difficult to arouse. MRI multiple scattered punctate acute to early subacute ischemic white matter infarcts involving the periventricular and deep white matter of both cerebral hemispheres, right greater than left. 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 PMH: type 1 diabetes, EF 20 to 25%, CKD stage IIIb, hypertension.  Chest x-ray showed severe cardiomegaly, pulmonary venous congestion, mild bilateral interstitial prominence (mild CHF versus pneumonitis cannot be excluded).  No data recorded Assessment / Plan / Recommendation CHL IP CLINICAL  IMPRESSIONS 06/27/2020 Clinical Impression Pt exhibited oral dysphagia  without laryngeal penetration or aspiration observed. 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 There were mild delays and holding of thin liquids. Mastication was mildly prolonged. Lingual residue spilled to pyriform sinuses with nectar. Once bolus reached oropharynx the strength and initiation of swallow mechanism was intact and effective to protect trachea. No residue post swallow. If he takes sequential sips thin or mixes textures he could have episodes of airway intrusion. Recommend Dys 2 texture, thin, straws allowed, meds via tube (crush when tube removed). Recommend keeping Cortrak to supplement intake as is predicted he may need until oral intake is adequate. SLP Visit Diagnosis Dysphagia, oral phase (R13.11) Attention and concentration deficit following -- Frontal lobe and executive function deficit following -- Impact on safety and function Mild aspiration risk   CHL IP TREATMENT RECOMMENDATION 06/27/2020 Treatment Recommendations Therapy as outlined in treatment plan below   Prognosis 06/27/2020 Prognosis for Safe Diet Advancement Good Barriers to Reach Goals (No Data) Barriers/Prognosis Comment -- CHL IP DIET RECOMMENDATION 06/27/2020 SLP Diet Recommendations Dysphagia 2 (Fine chop) solids;Thin liquid Liquid Administration via Straw;Cup Medication Administration Via alternative means Compensations Minimize environmental distractions;Slow rate;Small sips/bites;Lingual sweep for clearance of pocketing Postural Changes Seated upright at 90 degrees   CHL IP OTHER RECOMMENDATIONS 06/27/2020 Recommended Consults -- Oral Care Recommendations Oral care BID Other Recommendations --   CHL IP FOLLOW UP RECOMMENDATIONS 06/27/2020 Follow up Recommendations Inpatient Rehab   CHL IP FREQUENCY AND DURATION 06/27/2020 Speech Therapy Frequency (ACUTE ONLY) min 2x/week Treatment Duration 2 weeks      CHL IP ORAL PHASE 06/27/2020 Oral Phase Impaired Oral - Pudding Teaspoon -- Oral - Pudding Cup -- Oral - Honey  Teaspoon -- Oral - Honey Cup -- Oral - Nectar Teaspoon -- Oral - Nectar Cup Lingual/palatal residue Oral - Nectar Straw -- Oral - Thin Teaspoon -- Oral - Thin Cup Delayed oral transit Oral - Thin Straw Delayed oral transit Oral - Puree Delayed oral transit Oral - Mech Soft -- Oral - Regular Weak lingual manipulation Oral - Multi-Consistency -- Oral - Pill -- Oral Phase - Comment --  CHL IP PHARYNGEAL PHASE 06/27/2020 Pharyngeal Phase WFL Pharyngeal- Pudding Teaspoon -- Pharyngeal -- Pharyngeal- Pudding Cup -- Pharyngeal -- Pharyngeal- Honey Teaspoon -- Pharyngeal -- Pharyngeal- Honey Cup -- Pharyngeal -- Pharyngeal- Nectar Teaspoon -- Pharyngeal -- Pharyngeal- Nectar Cup WFL Pharyngeal -- Pharyngeal- Nectar Straw -- Pharyngeal -- Pharyngeal- Thin Teaspoon -- Pharyngeal -- Pharyngeal- Thin Cup WFL Pharyngeal -- Pharyngeal- Thin Straw WFL Pharyngeal -- Pharyngeal- Puree WFL Pharyngeal -- Pharyngeal- Mechanical Soft -- Pharyngeal -- Pharyngeal- Regular WFL Pharyngeal -- Pharyngeal- Multi-consistency -- Pharyngeal -- Pharyngeal- Pill -- Pharyngeal -- Pharyngeal Comment --  CHL IP CERVICAL ESOPHAGEAL PHASE 06/27/2020 Cervical Esophageal Phase WFL Pudding Teaspoon -- Pudding Cup -- Honey Teaspoon -- Honey Cup -- Nectar Teaspoon -- Nectar Cup -- Nectar Straw -- Thin Teaspoon -- Thin Cup -- Thin Straw -- Puree -- Mechanical Soft -- Regular -- Multi-consistency -- Pill -- Cervical Esophageal Comment -- [name] 06/27/2020, 4:34 PM  Orbie Pyo Litaker M.Ed Actor Pager [phone] Office [phone]             VAS [place] UPPER EXT VEIN MAPPING (PRE-OP AVF)  Result Date: 06/28/2020 UPPER EXTREMITY VEIN MAPPING  Indications: Pre-access. History: Renal failure.  Limitations: Patient with AMS tightening and holding arms to his sides - very  difficult exam Performing Technologist: Rogelia Rohrer  Examination Guidelines: A complete evaluation includes B-mode imaging, spectral Doppler, color  Doppler, and power Doppler as needed of all accessible portions of each vessel. Bilateral testing is considered an integral part of a complete examination. Limited examinations for reoccurring indications may be performed as noted. +-----------------+-------------+----------+--------------+ Right Cephalic   Diameter (cm)Depth (cm)   Findings    +-----------------+-------------+----------+--------------+ Shoulder                                not visualized +-----------------+-------------+----------+--------------+ Prox upper arm                          not visualized +-----------------+-------------+----------+--------------+ Mid upper arm                           not visualized +-----------------+-------------+----------+--------------+ Dist upper arm                          not visualized +-----------------+-------------+----------+--------------+ Antecubital fossa                       not visualized +-----------------+-------------+----------+--------------+ Prox forearm                            not visualized +-----------------+-------------+----------+--------------+ Mid forearm                             not visualized +-----------------+-------------+----------+--------------+ Dist forearm                            not visualized +-----------------+-------------+----------+--------------+ Wrist                                   not visualized +-----------------+-------------+----------+--------------+ +-----------------+-------------+----------+---------+ Right Basilic    Diameter (cm)Depth (cm)Findings  +-----------------+-------------+----------+---------+ Prox upper arm       0.41                         +-----------------+-------------+----------+---------+ Mid upper arm        0.48                         +-----------------+-------------+----------+---------+ Dist upper arm       0.43                          +-----------------+-------------+----------+---------+ Antecubital fossa    0.49               branching +-----------------+-------------+----------+---------+ Prox forearm         0.35               branching +-----------------+-------------+----------+---------+ Mid forearm          0.12               branching +-----------------+-------------+----------+---------+ Distal forearm       0.12                         +-----------------+-------------+----------+---------+ Wrist  0.12                         +-----------------+-------------+----------+---------+ Subcutaneous edema from [facility] fossa to mid/distal forearm. +-----------------+-------------+----------+---------+ Left Cephalic    Diameter (cm)Depth (cm)Findings  +-----------------+-------------+----------+---------+ Shoulder             0.17        0.75             +-----------------+-------------+----------+---------+ Prox upper arm       0.24        0.49             +-----------------+-------------+----------+---------+ Mid upper arm        0.30        0.38             +-----------------+-------------+----------+---------+ Dist upper arm       0.24        0.37             +-----------------+-------------+----------+---------+ Antecubital fossa    0.46        0.48   branching +-----------------+-------------+----------+---------+ Prox forearm         0.30        0.63             +-----------------+-------------+----------+---------+ Mid forearm          0.29        0.62             +-----------------+-------------+----------+---------+ Dist forearm         0.27        0.31             +-----------------+-------------+----------+---------+ Wrist                0.10        0.31             +-----------------+-------------+----------+---------+ +-----------------+-------------+----------+--------------------------+ Left Basilic     Diameter (cm)Depth (cm)         Findings           +-----------------+-------------+----------+--------------------------+ Prox upper arm       0.44                                          +-----------------+-------------+----------+--------------------------+ Mid upper arm        0.38                                          +-----------------+-------------+----------+--------------------------+ Dist upper arm       0.31                       branching          +-----------------+-------------+----------+--------------------------+ Antecubital fossa    0.50                       branching          +-----------------+-------------+----------+--------------------------+ Prox forearm         0.23                       branching          +-----------------+-------------+----------+--------------------------+ Mid forearm          0.17  Age Indeterminate Thrombus +-----------------+-------------+----------+--------------------------+ Distal forearm       0.13                                          +-----------------+-------------+----------+--------------------------+ Wrist                                         not visualized       +-----------------+-------------+----------+--------------------------+ Summary: Right: Subcutaneous fluid around area of IV in AC fossa. Left: Age indeterminate thrombus in mid forearm (basilic vein). *See table(s) above for measurements and observations.  Diagnosing physician: Monica Martinez MD Electronically signed by Monica Martinez MD on 06/28/2020 at 12:32:47 PM.    Final     Labs: BMET Recent Labs  Lab 06/24/20 1126 06/25/20 0537 06/25/20 2000 06/26/20 0335 06/27/20 0500 06/28/20 0602 06/29/20 0416  NA 135 135 137 136 138 141 140  K 3.8 3.8 3.9 4.6 3.6 3.6 3.5  CL 104 101 102 103 102 107 105  CO2 '23 22 22 '$ 18* '24 25 27  '$ GLUCOSE 165* 133* 190* 197* 230* 217* 112*  BUN 28* 33* 39* 44* 33* 40* 23*  CREATININE 3.44* 4.76* 5.60* 5.88* 4.86* 5.87* 4.22*   CALCIUM 7.7* 8.1* 8.4* 8.4* 8.2* 8.3* 8.1*  PHOS 2.5 4.8* 5.3* 5.2* 3.9 4.4 3.5   CBC Recent Labs  Lab 06/24/20 1126 06/25/20 0537 06/25/20 1920 06/26/20 0335 06/27/20 0500  WBC 10.3 11.0* 10.5 10.9* 10.3  NEUTROABS 9.2* 7.9*  --  8.0* 7.4  HGB 10.1* 9.2* 9.1* 9.2* 8.5*  HCT 29.0* 27.1* 26.6* 27.0* 24.5*  MCV 89.8 92.8 91.7 94.7 92.5  PLT 325 317 312 286 290    Medications:    . aspirin  81 mg Per NG tube Daily  . carvedilol  25 mg Per Tube BID WC  . chlorhexidine  15 mL Mouth Rinse BID  . Chlorhexidine Gluconate Cloth  6 each Topical Q0600  . clopidogrel  75 mg Per NG tube Daily  . darbepoetin (ARANESP) injection - DIALYSIS  60 mcg Intravenous Q Sat-HD  . famotidine  20 mg Per Tube QHS  . feeding supplement (OSMOLITE 1.5 CAL)  1,000 mL Per Tube Q24H  . feeding supplement (PROSource TF)  90 mL Per Tube BID  . heparin injection (subcutaneous)  5,000 Units Subcutaneous Q8H  . influenza vac split quadrivalent PF  0.5 mL Intramuscular Tomorrow-1000  . insulin aspart  0-9 Units Subcutaneous Q4H  . mouth rinse  15 mL Mouth Rinse q12n4p   Elmarie Shiley, MD 06/29/2020, 8:40 AM

## 2020-06-30 LAB — GLUCOSE, CAPILLARY
Glucose-Capillary: 129 mg/dL — ABNORMAL HIGH (ref 70–99)
Glucose-Capillary: 130 mg/dL — ABNORMAL HIGH (ref 70–99)
Glucose-Capillary: 136 mg/dL — ABNORMAL HIGH (ref 70–99)
Glucose-Capillary: 160 mg/dL — ABNORMAL HIGH (ref 70–99)
Glucose-Capillary: 88 mg/dL (ref 70–99)
Glucose-Capillary: 91 mg/dL (ref 70–99)
Glucose-Capillary: 96 mg/dL (ref 70–99)

## 2020-06-30 LAB — RENAL FUNCTION PANEL
Albumin: 1.8 g/dL — ABNORMAL LOW (ref 3.5–5.0)
Anion gap: 11 (ref 5–15)
BUN: 31 mg/dL — ABNORMAL HIGH (ref 6–20)
CO2: 26 mmol/L (ref 22–32)
Calcium: 8.4 mg/dL — ABNORMAL LOW (ref 8.9–10.3)
Chloride: 105 mmol/L (ref 98–111)
Creatinine, Ser: 5.2 mg/dL — ABNORMAL HIGH (ref 0.61–1.24)
GFR, Estimated: 14 mL/min — ABNORMAL LOW (ref >60)
Glucose, Bld: 87 mg/dL (ref 70–99)
Phosphorus: 4.8 mg/dL — ABNORMAL HIGH (ref 2.5–4.6)
Potassium: 3.6 mmol/L (ref 3.5–5.1)
Sodium: 142 mmol/L (ref 135–145)

## 2020-06-30 LAB — MAGNESIUM: Magnesium: 2.2 mg/dL (ref 1.7–2.4)

## 2020-06-30 MED ORDER — SODIUM CHLORIDE 0.9 % IV SOLN: INTRAVENOUS | Status: DC

## 2020-06-30 MED ORDER — FAMOTIDINE 20 MG PO TABS
20.0000 mg | ORAL_TABLET | Freq: Every day | ORAL | Status: DC
  Administered 2020-06-30 – 2020-07-03 (×4): 20 mg via ORAL
  Filled 2020-06-30: qty 2
  Filled 2020-06-30 (×4): qty 1

## 2020-06-30 NOTE — Progress Notes (Signed)
PROGRESS NOTE    KASHIEF NOVEMBER  [id number] DOB: 16-Mar-1986 DOA: 06/21/2020 PCP: Lenard Simmer, MD    Brief Narrative:  Patient is 35 year old gentleman who has history of type 1 diabetes on insulin, diabetic retinopathy, hypertension, stage IIIb chronic kidney disease who presented to [facility] on 1/26 with nausea, vomiting, malaise and weakness.  Patient went to work in the morning, he did not feel well so taken to the emergency room.  Wife reports that he had poor appetite and felt body ache for the last few days. Significant events: 1/26 admitted at [facility] in shock felt to be hypovolemic  1/27 cards consult, nephrology consult. Surgery and vascular consults for HD access 1/28 hypotensive and transferred to ICU. AMS, sz-like episodes During tele neuro eval. Rec HD 1/29 Keppra, meningitis coverage, [facility] and LP 1/30 LP performed by IR, MRI showed multiple cerebral infarcts1/26 admitted, in shock felt to be hypovolemic   1/28 CT H no acute intracranial abnormality -- ill defined low density focus in L thalamus 1/28 CTA h/n> no LVO  2/5 significant improvement in mental status.  Able to swallow. 2/7 mental status normalized.  Physical strength improving and normalizing.  1/29 MRI Brain 1. Multiple scattered punctate acute to early subacute ischemic white matter infarcts involving the periventricular and deep white matter of both cerebral hemispheres, right greater than left. No associated hemorrhage or mass effect. 2. 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   Assessment & Plan:   Active Problems:   Acute encephalopathy   Acute cerebral infarction ([place])   Diabetes mellitus type I ([place])   Stage 3 chronic kidney disease ([place])   Essential hypertension   Chronic systolic congestive heart failure (HCC)   Dysphagia, post-stroke  Acute encephalopathy, multifactorial.  Also suspected  viral encephalitis. Suspected secondary to uremia, multiple stroke. Mental status normalized now.  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 Negative for bacterial meningitis. Negative for autoimmune meningitis.  QuantiFERON and HIV negative.  Hepatitis panel negative.  Covid negative.  Streptococcus pneumoniae antigen negative.  Autoimmune work-up negative. Varicella-zoster PCR negative. No evidence of bacterial infection, antibiotics discontinued. 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  Apparently, labs were not collected on 1/30.  He is completing 10 days of acyclovir empirically.  We were able to locate his CSF sample from last week and sent for HSV 1 and 2 PCR.  Results pending.  Seizure disorder: Reported seizure like episodes more likely uremic symptoms.  Seen by neurology.  Currently remains on Keppra. 3 days video EEG did not demonstrate any seizures. Discussed with neurology, they recommended Keppra 500 mg 2 times a day until outpatient neurology follow-up.  We will send an ambulatory referral to neurology for follow-up.  Shock: Suspect hypovolemic shock.  Transiently on vasopressors.  Now off vasopressors.  Normalized.   Acute stroke: Acute multiple territory stroke thought to be secondary to hypotension and hypoperfusion. Encephalopathic, no focal deficit.  Aggressive rehab. Refer to CIR. Aspirin and plavix for 3 weeks and aspirin alone as suggested by neurology. LDL 44, no statin needed  A1c 7.4, on insulin.  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  Last dialysis 2/5. Has right IJ temporary catheter. Foley catheter discontinued. Nephrology trying gentle hydration overnight. Wait until dialysis planned.  Acute hypoxemic respiratory failure secondary to altered mental status: Extubated to nasal  cannula oxygen on  2/1. Now on room air.  Acute on chronic combined heart failure: Previous EF 45%.  TEE with ejection fraction of 20 to 25%. Suspect acute cardiomyopathy stress. on coreg. Will not tolerate ACE or ARB due to renal function abnormalities. Limited echocardiogram 2/6 with ejection fraction of 25 to 30%. Avoiding diuretics with anticipating improvement in renal functions. Fluid balance achieved by dialysis.  Clinically euvolemic.  Type 1 diabetes: Currently only on sliding scale insulin.  Stable.  Continue mobility.  Wants to go home with outpatient rehab. Ambulatory referral to cardiology and neurology done. Discharge home after hemodialysis plan finalized.    DVT prophylaxis: SCDs Start: 06/21/20 1551   Code Status: Full code Family Communication: No family at bedside today. Disposition Plan: Status is: Inpatient  Remains inpatient appropriate because:Persistent severe electrolyte disturbances, Altered mental status and Inpatient level of care appropriate due to severity of illness   Dispo: The patient is from: Home              Anticipated d/c is to: Home.              Anticipated d/c date is: 2 to 3 days.              Patient currently is not medically stable to go home.   Difficult to place patient No   Consultants:   PCCM  Neurology  Surgery  Procedures:   Multiple procedures.  Now on hemodialysis.  Antimicrobials:  1/29 acyclovir> 2/7. 1/29 ampicillin> 2/3 1/29 rocephin> 2/3 1/29 vanc> 2/3   Subjective: Patient seen and examined.  Wife at the bedside.  Phenomenal recovery.  He was able to walk to bathroom with minimal support with a walker.  Eating 100% of the meal.  No urinary retention after Foley catheter removal. He does not want to go to CIR.  He would rather go home. Pending final dialysis plan.  Objective: Vitals:   06/30/20 0415 06/30/20 0500 06/30/20 0800 06/30/20 1143  BP: (!) 141/85  (!) 147/93 (!) 148/90  Pulse: 78  81 76  Resp: '19  18 18   '$ Temp: 98.9 F (37.2 C)  98.2 F (36.8 C) 97.6 F (36.4 C)  TempSrc:   Oral Oral  SpO2: 94%  100% 100%  Weight:  103.5 kg      Intake/Output Summary (Last 24 hours) at 06/30/2020 1332 Last data filed at 06/30/2020 [id number] Gross per 24 hour  Intake 240 ml  Output 975 ml  Net -735 ml   Filed Weights   06/26/20 1005 06/29/20 0500 06/30/20 0500  Weight: 103.9 kg 103.4 kg 103.5 kg    Examination:  General exam: Calm comfortable.  Conversant.  On room air. Respiratory system: Clear to auscultation. Respiratory effort normal. No added sounds. Cardiovascular system: S1 & S2 heard, RRR. No JVD, murmurs, rubs, gallops or clicks. No pedal edema. Gastrointestinal system: soft and non tender. BS present.  Central nervous system: Patient alert awake and oriented.  Able to have clear conversation, speech is clear. Extremities: generalized weakness.  No focal deficits.   Data Reviewed: I have personally reviewed following labs and imaging studies  CBC: Recent Labs  Lab 06/24/20 1126 06/25/20 0537 06/25/20 1920 06/26/20 0335 06/27/20 0500  WBC 10.3 11.0* 10.5 10.9* 10.3  NEUTROABS 9.2* 7.9*  --  8.0* 7.4  HGB 10.1* 9.2* 9.1* 9.2* 8.5*  HCT 29.0* 27.1* 26.6* 27.0* 24.5*  MCV 89.8 92.8 91.7 94.7 92.5  PLT 325 317 312 286 290  Basic Metabolic Panel: Recent Labs  Lab 06/26/20 0335 06/27/20 0500 06/28/20 0602 06/29/20 0416 06/30/20 0248  NA 136 138 141 140 142  K 4.6 3.6 3.6 3.5 3.6  CL 103 102 107 105 105  CO2 18* '24 25 27 26  '$ GLUCOSE 197* 230* 217* 112* 87  BUN 44* 33* 40* 23* 31*  CREATININE 5.88* 4.86* 5.87* 4.22* 5.20*  CALCIUM 8.4* 8.2* 8.3* 8.1* 8.4*  MG 2.5* 2.3 2.3 2.1 2.2  PHOS 5.2* 3.9 4.4 3.5 4.8*   GFR: Estimated Creatinine Clearance: 24.2 mL/min (A) (by C-G formula based on SCr of 5.2 mg/dL (H)). Liver Function Tests: Recent Labs  Lab 06/26/20 0335 06/27/20 0500 06/28/20 0602 06/29/20 0416 06/30/20 0248  ALBUMIN 1.8* 1.7* 1.6* 1.7* 1.8*   No results  for input(s): LIPASE, AMYLASE in the last 168 hours. No results for input(s): AMMONIA in the last 168 hours. Coagulation Profile: No results for input(s): INR, PROTIME in the last 168 hours. Cardiac Enzymes: No results for input(s): CKTOTAL, CKMB, CKMBINDEX, TROPONINI in the last 168 hours. BNP (last 3 results) No results for input(s): PROBNP in the last 8760 hours. HbA1C: No results for input(s): HGBA1C in the last 72 hours. CBG: Recent Labs  Lab 06/30/20 0023 06/30/20 0414 06/30/20 0649 06/30/20 0831 06/30/20 1140  GLUCAP 96 88 91 130* 129*   Lipid Profile: Recent Labs    06/28/20 0602  TRIG 68   Thyroid Function Tests: No results for input(s): TSH, T4TOTAL, FREET4, T3FREE, THYROIDAB in the last 72 hours. Anemia Panel: No results for input(s): VITAMINB12, FOLATE, FERRITIN, TIBC, IRON, RETICCTPCT in the last 72 hours. Sepsis Labs: No results for input(s): PROCALCITON, LATICACIDVEN in the last 168 hours.  Recent Results (from the past 240 hour(s))  MRSA PCR Screening     Status: None   Collection Time: 06/21/20  3:55 PM   Specimen: Nasal Mucosa; Nasopharyngeal  Result Value Ref Range Status   MRSA by PCR NEGATIVE NEGATIVE Final    Comment:        The GeneXpert MRSA Assay (FDA approved for NASAL specimens only), is one component of a comprehensive MRSA colonization surveillance program. It is not intended to diagnose MRSA infection nor to guide or monitor treatment for MRSA infections. Performed at [facility], [place] [address], [place], [place] [address]   CSF culture     Status: None   Collection Time: 06/22/20  1:31 PM   Specimen: PATH Cytology CSF; Cerebrospinal Fluid  Result Value Ref Range Status   Specimen Description CSF  Final   Special Requests NONE  Final   Gram Stain   Final    WBC PRESENT,BOTH PMN AND MONONUCLEAR NO ORGANISMS SEEN CYTOSPIN SMEAR    Culture   Final    NO GROWTH 3 DAYS Performed at [facility], [place] [address], [place], [place] [address]    Report Status 06/25/2020 FINAL  Final  Culture, fungus without smear     Status: None (Preliminary result)   Collection Time: 06/22/20  1:31 PM   Specimen: PATH Cytology CSF; Cerebrospinal Fluid  Result Value Ref Range Status   Specimen Description CSF  Final   Special Requests NONE  Final   Culture   Final    NO FUNGUS ISOLATED AFTER 7 DAYS Performed at [facility], [place] [address], [place], [place] [address]    Report Status PENDING  Incomplete         Radiology Studies: ECHOCARDIOGRAM LIMITED  Result Date: 06/29/2020    ECHOCARDIOGRAM LIMITED REPORT   Patient Name:   TAYVIEN KINLAW Date of Exam: 06/29/2020 Medical Rec #:  [id number]        Height:       72.0 in Accession #:    [id number]       Weight:       228.0 lb Date of Birth:  [birth year]         BSA:          2.252 m Patient Age:    40 years         BP:           119/78 mmHg Patient Gender: M                HR:           81 bpm. Exam Location:  Inpatient Procedure: Limited Echo, Limited Color Doppler and Intracardiac Opacification            Agent Indications:    [id number] Acute on chronic systolic (congestive) heart failure  History:        Patient has prior history of Echocardiogram examinations, most                 recent 06/23/2020. Risk Factors:Diabetes.  Sonographer:    Jonelle Sidle Dance Referring Phys: [id number] [place]  1. Since th elast study on 06/19/2020 LVEF has decreased from 40% to 25-30% with diffuse hypokinesis. RVEF is moderately decreased.  2. Left ventricular ejection fraction, by estimation, is 25 to 30%. The left ventricle has severely decreased function. The left ventricle demonstrates global hypokinesis. The left ventricular internal cavity size was severely dilated. Left ventricular diastolic function could not be evaluated.  3. Right ventricular systolic function is moderately reduced. The right ventricular size is mildly enlarged.  4. A small pericardial effusion is  present. The pericardial effusion is posterior to the left ventricle.  5. Mild mitral valve regurgitation.  6. Aortic valve regurgitation is trivial. FINDINGS  Left Ventricle: Left ventricular ejection fraction, by estimation, is 25 to 30%. The left ventricle has severely decreased function. The left ventricle demonstrates global hypokinesis. Definity contrast agent was given IV to delineate the left ventricular endocardial borders. The left ventricular internal cavity size was severely dilated. Left ventricular diastolic function could not be evaluated. Right Ventricle: The right ventricular size is mildly enlarged. Right ventricular systolic function is moderately reduced. Pericardium: A small pericardial effusion is present. The pericardial effusion is posterior to the left ventricle. Mitral Valve: Mild mitral valve regurgitation. Tricuspid Valve: Tricuspid valve regurgitation is mild. Aortic Valve: Aortic valve regurgitation is trivial. LEFT VENTRICLE PLAX 2D LVIDd:         6.20 cm LVIDs:         5.10 cm LV PW:         1.30 cm LV IVS:        1.20 cm LVOT diam:     2.30 cm LVOT Area:     4.15 cm  LEFT ATRIUM         Index LA diam:    4.20 cm 1.86 cm/m   AORTA Ao Root diam: 3.90 cm  SHUNTS Systemic Diam: 2.30 cm Ena Dawley MD Electronically signed by Ena Dawley MD Signature Date/Time: 06/29/2020/3:30:13 PM    Final         Scheduled Meds: . aspirin  81 mg Oral Daily  . carvedilol  25 mg Oral BID WC  .  chlorhexidine  15 mL Mouth Rinse BID  . Chlorhexidine Gluconate Cloth  6 each Topical Q0600  . clopidogrel  75 mg Oral Daily  . darbepoetin (ARANESP) injection - DIALYSIS  60 mcg Intravenous Q Sat-HD  . famotidine  20 mg Oral QHS  . insulin aspart  0-9 Units Subcutaneous TID AC & HS  . levETIRAcetam  500 mg Oral BID  . mouth rinse  15 mL Mouth Rinse q12n4p   Continuous Infusions: . sodium chloride    . sodium chloride 100 mL/hr at 06/30/20 1127     LOS: 9 days    Time spent: 30  minutes    Barb Merino, MD [facility] Pager [phone]

## 2020-06-30 NOTE — Progress Notes (Signed)
Physical Therapy Treatment Patient Details Name: Adrian Evans MRN: [id number] DOB: 1985-11-28 Today's Date: 06/30/2020    History of Present Illness 35 yo M admitted to [facility] 1/26 w n/v malaise, weakness. Hypotensive in ED and volume resuscitated. Worse AMS without clear etiology, transferring to [facility] 1/29 for LP and cEEG. MRI revealed multiple scattered punctate acute ishcemic infarcts involving both cerebral hemisphers, R greater than L and a small remote L PCA territory infarct, with additional small remote L thalamic and R cerebellar infarcts. Pt extubated 2/1.    PT Comments    Pt seen for mobility progression today. Pt now alert and oriented. Has progressed to supervision to min guard, with bouts of min assist with dynamic activities with mobility. Pt has 24 hours assist for home. Have updated discharge recomendations to Outpatient Neuro rehab with pt to have 24 hours supervision/assistance at home. Primary PT in agreement with updated recommendations. Acute PT to continue during pt's hospital stay to continued to address mobility and balance.    Follow Up Recommendations  Outpatient PT (Neuro Outpatient Rehab)     Equipment Recommendations  Wheelchair (measurements PT);Wheelchair cushion (measurements PT);Rolling walker with 5" wheels    Precautions / Restrictions Precautions Precautions: Fall Precaution Comments: unsteady with dynamic gait with RW Restrictions Weight Bearing Restrictions: No    Mobility  Bed Mobility               General bed mobility comments: pt in chair before and after session  Transfers Overall transfer level: Needs assistance   Transfers: Sit to/from Stand Sit to Stand: Supervision         General transfer comment: supervision for safety due to IV line/montior wires. no assistance or cues needed with good hand placement and weight shifting. Did need UE support upon standing on RW.  Ambulation/Gait Ambulation/Gait assistance: Min guard;Min  assist Gait Distance (Feet): 200 Feet Assistive device: Rolling walker (2 wheeled) Gait Pattern/deviations: Step-through pattern;Ataxic;Narrow base of support Gait velocity: decreased   General Gait Details: Pt mostly min guard assist with RW for straight pathways. When engaged in dynamic tasks (scanning eviroment) or negotiating around objects in hallway needed min assist x 3 episodes due to scissoring of feet x2, RW lifting on left side x1. Spouse present and education to be with pt at all times with gait once home and to use RW at all times with gait for now for safety/decreased fall risk.  Both verbalized understanding.    Modified Rankin (Stroke Patients Only) Modified Rankin (Stroke Patients Only) Pre-Morbid Rankin Score: No symptoms Modified Rankin: Moderate disability     Cognition Arousal/Alertness: Awake/alert Behavior During Therapy: WFL for tasks assessed/performed Overall Cognitive Status: Within Functional Limits for tasks assessed                    General Comments: alert and oriented x4. ? higher level cognitive dysfuntion, did not assess this session-deferred to OT and ST.       Pertinent Vitals/Pain Pain Assessment: No/denies pain     PT Goals (current goals can now be found in the care plan section) Acute Rehab PT Goals Patient Stated Goal: to go home PT Goal Formulation: With patient/family Time For Goal Achievement: 07/09/20 Potential to Achieve Goals: Good Progress towards PT goals: Progressing toward goals    Frequency    Min 4X/week      PT Plan Discharge plan needs to be updated;Equipment recommendations need to be updated    AM-PAC PT "6 Clicks" Mobility  Outcome Measure  Help needed turning from your back to your side while in a flat bed without using bedrails?: A Little Help needed moving from lying on your back to sitting on the side of a flat bed without using bedrails?: A Little Help needed moving to and from a bed to a  chair (including a wheelchair)?: None Help needed standing up from a chair using your arms (e.g., wheelchair or bedside chair)?: None Help needed to walk in hospital room?: A Little Help needed climbing 3-5 steps with a railing? : A Little 6 Click Score: 20    End of Session Equipment Utilized During Treatment: Gait belt Activity Tolerance: Patient tolerated treatment well Patient left: in chair;with call bell/phone within reach;with family/visitor present;Other (comment) (with OT and ST in room) Nurse Communication: Mobility status PT Visit Diagnosis: Unsteadiness on feet (R26.81);Muscle weakness (generalized) (M62.81);Difficulty in walking, not elsewhere classified (R26.2);Other symptoms and signs involving the nervous system (R29.898)     Time: [id number] PT Time Calculation (min) (ACUTE ONLY): 23 min  Charges:  $Gait Training: 8-22 mins $Neuromuscular Re-education: 8-22 mins                    Willow Ora, PTA, [facility] Acute Rehab Services Office- [phone] 06/30/20, 12:12 PM   Willow Ora 06/30/2020, 12:11 PM

## 2020-06-30 NOTE — Progress Notes (Signed)
Pt has completed 10d of empiric acyclovir for HSV encephalitis. Ok to stop therapy today per Dr. Raelyn Mora.  Onnie Boer, PharmD, BCIDP, AAHIVP, CPP Infectious Disease Pharmacist 06/30/2020 10:10 AM

## 2020-06-30 NOTE — Progress Notes (Signed)
Patient ID: Adrian Evans, male   DOB: [birth year], 35 y.o.   MRN: [id number] Adrian Evans Progress Note   Assessment/ Plan:   1. Acute kidney Injury on chronic kidney disease stage IV: Baseline creatinine suspected to be in the mid 3 range (underlying CKD likely from DM) and presented to the hospital with acute kidney injury in the setting of cardiogenic versus hypovolemic shock and ATN.  Initially underwent CRRT and was transitioned to intermittent hemodialysis after becoming more clinically/hemodynamically stable.  Patient received dialysis on 2/5.  Creatinine on 2/6 was 4.22 with a BUN of 23.  Increased overnight to 5.20 with a BUN of 31.  Patient had decreased urine output on 2/6 with total measured urine output of 525 mL.  Plan for repeat renal function panel tomorrow and another session of dialysis if needed. 2.  Acute metabolic encephalopathy: Suspected to be multifactorial in origin with severe azotemia/uremia but patient also noted to have had multiple scattered punctate cerebellar infarcts on MRI raising some concern. 3.  Congestive heart failure with reduced ejection fraction: 4.  Anemia: Secondary to chronic kidney disease and exacerbated by acute/critical illness 5.  Secondary hyperparathyroidism: Calcium and phosphorus level within acceptable limits; continue to monitor with sequential labs.  Subjective:   Did well overnight.  Incredibly conversive this morning saying that he wants to go home.   Objective:   BP (!) 141/85 (BP Location: Left Arm)   Pulse 78   Temp 98.9 F (37.2 C)   Resp 19   Wt 103.5 kg   SpO2 94%   BMI 30.95 kg/m   Intake/Output Summary (Last 24 hours) at 06/30/2020 0801 Last data filed at 06/30/2020 0321 Gross per 24 hour  Intake -  Output 525 ml  Net -525 ml   Weight change: 0.1 kg  Physical Exam: Gen: Sitting on the edge of the bed talking with nursing staff on a.m. of the room.  Responds appropriately to questions immediately. CVS:  Pulse regular rhythm, normal rate, S1 and S2 normal Resp: Normal work of breathing.  Lungs are clear to auscultation bilaterally Abd: Soft, flat, nontender, bowel sounds normal Ext: No lower extremity edema  Imaging: ECHOCARDIOGRAM LIMITED  Result Date: 06/29/2020    ECHOCARDIOGRAM LIMITED REPORT   Patient Name:   Adrian Evans Date of Exam: 06/29/2020 Medical Rec #:  [id number]        Height:       72.0 in Accession #:    [id number]       Weight:       228.0 lb Date of Birth:  1985-09-29         BSA:          2.252 m Patient Age:    89 years         BP:           119/78 mmHg Patient Gender: M                HR:           81 bpm. Exam Location:  Inpatient Procedure: Limited Echo, Limited Color Doppler and Intracardiac Opacification            Agent Indications:    [id number] Acute on chronic systolic (congestive) heart failure  History:        Patient has prior history of Echocardiogram examinations, most                 recent 06/23/2020. Risk Factors:Diabetes.  Sonographer:    Jonelle Sidle Dance Referring Phys: [id number] [place]  1. Since th elast study on 06/19/2020 LVEF has decreased from 40% to 25-30% with diffuse hypokinesis. RVEF is moderately decreased.  2. Left ventricular ejection fraction, by estimation, is 25 to 30%. The left ventricle has severely decreased function. The left ventricle demonstrates global hypokinesis. The left ventricular internal cavity size was severely dilated. Left ventricular diastolic function could not be evaluated.  3. Right ventricular systolic function is moderately reduced. The right ventricular size is mildly enlarged.  4. A small pericardial effusion is present. The pericardial effusion is posterior to the left ventricle.  5. Mild mitral valve regurgitation.  6. Aortic valve regurgitation is trivial. FINDINGS  Left Ventricle: Left ventricular ejection fraction, by estimation, is 25 to 30%. The left ventricle has severely decreased function. The left ventricle  demonstrates global hypokinesis. Definity contrast agent was given IV to delineate the left ventricular endocardial borders. The left ventricular internal cavity size was severely dilated. Left ventricular diastolic function could not be evaluated. Right Ventricle: The right ventricular size is mildly enlarged. Right ventricular systolic function is moderately reduced. Pericardium: A small pericardial effusion is present. The pericardial effusion is posterior to the left ventricle. Mitral Valve: Mild mitral valve regurgitation. Tricuspid Valve: Tricuspid valve regurgitation is mild. Aortic Valve: Aortic valve regurgitation is trivial. LEFT VENTRICLE PLAX 2D LVIDd:         6.20 cm LVIDs:         5.10 cm LV PW:         1.30 cm LV IVS:        1.20 cm LVOT diam:     2.30 cm LVOT Area:     4.15 cm  LEFT ATRIUM         Index LA diam:    4.20 cm 1.86 cm/m   AORTA Ao Root diam: 3.90 cm  SHUNTS Systemic Diam: 2.30 cm Adrian Dawley MD Electronically signed by Adrian Dawley MD Signature Date/Time: 06/29/2020/3:30:13 PM    Final     Labs: BMET Recent Labs  Lab 06/25/20 [id number] 06/25/20 2000 06/26/20 0335 06/27/20 0500 06/28/20 0602 06/29/20 0416 06/30/20 0248  NA 135 137 136 138 141 140 142  K 3.8 3.9 4.6 3.6 3.6 3.5 3.6  CL 101 102 103 102 107 105 105  CO2 22 22 18* '24 25 27 26  '$ GLUCOSE 133* 190* 197* 230* 217* 112* 87  BUN 33* 39* 44* 33* 40* 23* 31*  CREATININE 4.76* 5.60* 5.88* 4.86* 5.87* 4.22* 5.20*  CALCIUM 8.1* 8.4* 8.4* 8.2* 8.3* 8.1* 8.4*  PHOS 4.8* 5.3* 5.2* 3.9 4.4 3.5 4.8*   CBC Recent Labs  Lab 06/24/20 1126 06/25/20 0537 06/25/20 1920 06/26/20 0335 06/27/20 0500  WBC 10.3 11.0* 10.5 10.9* 10.3  NEUTROABS 9.2* 7.9*  --  8.0* 7.4  HGB 10.1* 9.2* 9.1* 9.2* 8.5*  HCT 29.0* 27.1* 26.6* 27.0* 24.5*  MCV 89.8 92.8 91.7 94.7 92.5  PLT 325 317 312 286 290    Medications:    . aspirin  81 mg Oral Daily  . carvedilol  25 mg Oral BID WC  . chlorhexidine  15 mL Mouth Rinse BID  .  Chlorhexidine Gluconate Cloth  6 each Topical Q0600  . clopidogrel  75 mg Oral Daily  . darbepoetin (ARANESP) injection - DIALYSIS  60 mcg Intravenous Q Sat-HD  . famotidine  20 mg Oral QHS  . influenza vac split quadrivalent PF  0.5 mL Intramuscular Tomorrow-1000  .  insulin aspart  0-9 Units Subcutaneous TID AC & HS  . levETIRAcetam  500 mg Oral BID  . mouth rinse  15 mL Mouth Rinse q12n4p      Gifford Shave, MD 06/30/2020, 8:01 AM

## 2020-06-30 NOTE — Progress Notes (Addendum)
Inpatient Rehabilitation Admissions Coordinator  I met with patient and his wife at bedside. He is up in recliner and requesting to ambulate in the hallways. I have updated Delories Heinz, PTA of his request. The couple prefer to go directly home with outpatient therapy rather than CIR due to his recovery functionally noted over the weekend. I await therapy assessments today.  Danne Baxter, RN, MSN Rehab Admissions Coordinator [phone] 06/30/2020 11:32 AM   Discussed with therapy after assessment today. Recommendations will be for outpatient follow up after d/c. We will sign off at this time. Acute team and TOC made aware.  Danne Baxter, RN, MSN Rehab Admissions Coordinator [phone] 06/30/2020 12:05 PM

## 2020-06-30 NOTE — Progress Notes (Signed)
Wheelchair:  Patient suffers from stroke hich impairs their ability to perform daily activities like ambulating in the home. A walker will not resolve  issue with performing activities of daily living. A wheelchair will allow patient to safely perform daily activities. Patient is not able to propel themselves in the home using a standard weight wheelchair due to weakness. Patient can self propel in the lightweight wheelchair. Length of need 6 months.  Accessories: elevating leg rests (ELRs), wheel locks, extensions and anti-tippers.

## 2020-06-30 NOTE — Progress Notes (Signed)
  Speech Language Pathology Treatment: Dysphagia;Cognitive-Linquistic  Patient Details Name: Adrian Evans MRN: [id number] DOB: December 26, 1985 Today's Date: 06/30/2020 Time: 1150-1206 SLP Time Calculation (min) (ACUTE ONLY): 16 min  Assessment / Plan / Recommendation Clinical Impression  Adrian Evans has made phenomenal improvements in all areas! He is sitting in chair with Adrian Evans present who reported pt began to improve on Saturday. He rate of speech is excessive and therapist detected a slight dysfluency and his wife reported that "he talks very fast normally" and Adrian Evans affirmed he had a stutter as a child. Conversational speech was 99% intelligible. He was alert, attentive, able to recall information. Exhibited functional problem solving for eating, normal pragmatics. 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 No further ST needed in acute care.    HPI HPI: Adrian Evans is a 35 y.o. male admitted with generalized weakness, malaise, nausea vomiting, nasal congestion/sneezing/cough. Found to be in shock, concern for sepis, acute renal failure and acute stroke. Intubated 1/29-2/1 after becoming difficult to arouse. MRI multiple scattered punctate acute to early subacute ischemic white matter infarcts involving the periventricular and deep white matter of both cerebral hemispheres, right greater than left. 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 PMH: type 1 diabetes, EF 20 to 25%, CKD stage IIIb, hypertension.  Chest x-ray showed severe cardiomegaly, pulmonary venous congestion, mild bilateral interstitial prominence (mild CHF versus pneumonitis cannot be excluded).      SLP Plan  All goals met       Recommendations  Diet  recommendations: Regular;Thin liquid Liquids provided via: Straw;Cup Medication Administration: Whole meds with liquid Supervision: Patient able to self feed Postural Changes and/or Swallow Maneuvers: Seated upright 90 degrees                Oral Care Recommendations: Oral care BID Follow up Recommendations: Outpatient SLP SLP Visit Diagnosis: Dysarthria and anarthria (R47.1);Cognitive communication deficit [phone]) Plan: All goals met       GO                Adrian Evans 06/30/2020, 1:01 PM

## 2020-06-30 NOTE — Progress Notes (Signed)
PHARMACIST - PHYSICIAN COMMUNICATION  DR:   Sloan Leiter K  CONCERNING: IV to Oral Route Change Policy  RECOMMENDATION: This patient is receiving famotidine by the intravenous route.  Based on criteria approved by the Pharmacy and Therapeutics Committee, the intravenous medication(s) is/are being converted to the equivalent oral dose form(s).   DESCRIPTION: These criteria include:  The patient is eating (either orally or via tube) and/or has been taking other orally administered medications for a least 24 hours  The patient has no evidence of active gastrointestinal bleeding or impaired GI absorption (gastrectomy, short bowel, patient on TNA or NPO).  If you have questions about this conversion, please contact the Pharmacy Department  '[]'$   [phone] )  [facility] '[]'$   [phone] )  [facility] '[x]'$   [phone] )  [facility] '[]'$   [phone] )  [facility] '[]'$   [phone] )  [place], [place] 06/30/2020 9:57 AM

## 2020-06-30 NOTE — Progress Notes (Signed)
Occupational Therapy Treatment Patient Details Name: THEDORE PICKEL MRN: [id number] DOB: 05/14/1986 Today's Date: 06/30/2020    History of present illness 35 yo M admitted to [facility] 1/26 w n/v malaise, weakness. Hypotensive in ED and volume resuscitated. Worse AMS without clear etiology, transferring to [facility] 1/29 for LP and cEEG. MRI revealed multiple scattered punctate acute ishcemic infarcts involving both cerebral hemisphers, R greater than L and a small remote L PCA territory infarct, with additional small remote L thalamic and R cerebellar infarcts. Pt extubated 2/1.   OT comments  Pt demonstrated excellent progress toward established OT goals. Pt alert and oriented sitting in recliner with PT upon OT arrival. Pt currently requires minguard assistance for toilet transfer and grooming while standing at sink level. Pt continues to demonstrate instability, weakness, and cognitive limitations impacting his safety and independence with ADL/IADL. Pt's cognitive limitations primarily higher level executive functioning skills. Pt would greatly benefit from outpatient OT to maximize return to prior level independence. Will continue to follow acutely and progress as tolerated.    Follow Up Recommendations  Supervision/Assistance - 24 hour;Outpatient OT    Equipment Recommendations  None recommended by OT    Recommendations for Other Services Rehab consult    Precautions / Restrictions Precautions Precautions: Fall Precaution Comments: unsteady with dynamic gait with RW Restrictions Weight Bearing Restrictions: No       Mobility Bed Mobility               General bed mobility comments: pt in chair before and after session  Transfers Overall transfer level: Needs assistance Equipment used: Rolling walker (2 wheeled) Transfers: Sit to/from Stand Sit to Stand: Min guard         General transfer comment: minguard for safety, pt with good hand placement    Balance Overall balance  assessment: Needs assistance Sitting-balance support: Feet supported;Bilateral upper extremity supported Sitting balance-Leahy Scale: Good Sitting balance - Comments: no loss of balance noted, pt maintaining midline   Standing balance support: No upper extremity supported;During functional activity Standing balance-Leahy Scale: Fair Standing balance comment: able to stand at sink to complete ADL without UE support, unable to tolerate much challenge, use of RW during mobility for improved stabilit                           ADL either performed or assessed with clinical judgement   ADL Overall ADL's : Needs assistance/impaired Eating/Feeding: Modified independent Eating/Feeding Details (indicate cue type and reason): pt able to open containers Grooming: Min guard;Standing Grooming Details (indicate cue type and reason): washed hands at sink level             Lower Body Dressing: Minimal assistance;Sit to/from stand   Toilet Transfer: Min guard;Ambulation;RW Toilet Transfer Details (indicate cue type and reason): ambulated to [facility] over toilet Toileting- Clothing Manipulation and Hygiene: Min guard       Functional mobility during ADLs: Min guard;Rolling walker General ADL Comments: minguard for safety and stability, pt demonstrate improvements with mobility     Vision   Vision Assessment?: Yes Eye Alignment: Within Functional Limits Ocular Range of Motion: Within Functional Limits Alignment/Gaze Preference: Within Defined Limits Tracking/Visual Pursuits: Able to track stimulus in all quads without difficulty Saccades: Within functional limits Convergence: Within functional limits Visual Fields: No apparent deficits   Perception     Praxis      Cognition Arousal/Alertness: Awake/alert Behavior During Therapy: WFL for tasks assessed/performed Overall Cognitive  Status: Impaired/Different from baseline Area of Impairment: Attention;Following  commands;Safety/judgement;Problem solving;Awareness;Memory                   Current Attention Level: Alternating Memory: Decreased short-term memory;Decreased recall of precautions Following Commands: Follows one step commands with increased time;Follows one step commands inconsistently (follows commands approx 75% of time) Safety/Judgement: Decreased awareness of safety;Decreased awareness of deficits Awareness: Intellectual Problem Solving: Slow processing;Decreased initiation;Difficulty sequencing;Requires verbal cues;Requires tactile cues General Comments: pt demonstrates decreased attention span, he completed trail making test and was deficient in completion. Pt demonstrated limitations with problem solving, short term memory and working memory. Will continue to assess        Exercises     Shoulder Instructions       General Comments      Pertinent Vitals/ Pain       Pain Assessment: No/denies pain Pain Intervention(s): Monitored during session;Limited activity within patient's tolerance  Home Living                                          Prior Functioning/Environment              Frequency  Min 2X/week        Progress Toward Goals  OT Goals(current goals can now be found in the care plan section)  Progress towards OT goals: Progressing toward goals;Goals met and updated - see care plan  Acute Rehab OT Goals Patient Stated Goal: to go home OT Goal Formulation: With patient Time For Goal Achievement: 07/10/20 Potential to Achieve Goals: Good ADL Goals Pt Will Perform Grooming: Independently;standing Additional ADL Goal #1: Pt will demonstrate ability to complete medication management with direct supervision. Additional ADL Goal #2: Pt will attend to ADL/IADL for 10 minutes in moderately distracting environment. Additional ADL Goal #3: Pt will demonstrate anticipatory awareness for safe engagement in IADL.  Plan Discharge plan  needs to be updated    Co-evaluation                 AM-PAC OT "6 Clicks" Daily Activity     Outcome Measure   Help from another person eating meals?: None Help from another person taking care of personal grooming?: A Little Help from another person toileting, which includes using toliet, bedpan, or urinal?: A Little Help from another person bathing (including washing, rinsing, drying)?: A Little Help from another person to put on and taking off regular upper body clothing?: A Little Help from another person to put on and taking off regular lower body clothing?: A Little 6 Click Score: 19    End of Session Equipment Utilized During Treatment: Gait belt;Rolling walker  OT Visit Diagnosis: Unsteadiness on feet (R26.81);Other abnormalities of gait and mobility (R26.89);Muscle weakness (generalized) (M62.81);Ataxia, unspecified (R27.0);Hemiplegia and hemiparesis Hemiplegia - caused by: Cerebral infarction   Activity Tolerance Patient tolerated treatment well   Patient Left with call bell/phone within reach;with family/visitor present;in chair;with chair alarm set   Nurse Communication Mobility status        Time: 0737-1062 OT Time Calculation (min): 40 min  Charges: OT General Charges $OT Visit: 1 Visit OT Treatments $Self Care/Home Management : 23-37 mins $Cognitive Funtion inital: Initial 15 mins  Orva Riles OTR/L Acute Rehabilitation Services Office: [place] 06/30/2020, 2:38 PM

## 2020-07-01 LAB — RENAL FUNCTION PANEL
Albumin: 1.9 g/dL — ABNORMAL LOW (ref 3.5–5.0)
Anion gap: 12 (ref 5–15)
BUN: 32 mg/dL — ABNORMAL HIGH (ref 6–20)
CO2: 22 mmol/L (ref 22–32)
Calcium: 8.3 mg/dL — ABNORMAL LOW (ref 8.9–10.3)
Chloride: 106 mmol/L (ref 98–111)
Creatinine, Ser: 5.53 mg/dL — ABNORMAL HIGH (ref 0.61–1.24)
GFR, Estimated: 13 mL/min — ABNORMAL LOW (ref >60)
Glucose, Bld: 121 mg/dL — ABNORMAL HIGH (ref 70–99)
Phosphorus: 5 mg/dL — ABNORMAL HIGH (ref 2.5–4.6)
Potassium: 3.6 mmol/L (ref 3.5–5.1)
Sodium: 140 mmol/L (ref 135–145)

## 2020-07-01 LAB — GLUCOSE, CAPILLARY
Glucose-Capillary: 111 mg/dL — ABNORMAL HIGH (ref 70–99)
Glucose-Capillary: 124 mg/dL — ABNORMAL HIGH (ref 70–99)
Glucose-Capillary: 128 mg/dL — ABNORMAL HIGH (ref 70–99)
Glucose-Capillary: 132 mg/dL — ABNORMAL HIGH (ref 70–99)
Glucose-Capillary: 136 mg/dL — ABNORMAL HIGH (ref 70–99)
Glucose-Capillary: 161 mg/dL — ABNORMAL HIGH (ref 70–99)
Glucose-Capillary: 97 mg/dL (ref 70–99)

## 2020-07-01 LAB — HSV 1/2 PCR, CSF
HSV-1 DNA: NEGATIVE
HSV-2 DNA: NEGATIVE

## 2020-07-01 LAB — TRIGLYCERIDES: Triglycerides: 88 mg/dL (ref <150)

## 2020-07-01 LAB — MAGNESIUM: Magnesium: 1.9 mg/dL (ref 1.7–2.4)

## 2020-07-01 NOTE — Progress Notes (Signed)
PROGRESS NOTE    Adrian Evans  [id number] DOB: 08-Dec-1985 DOA: 06/21/2020 PCP: Lenard Simmer, MD    Brief Narrative:  Patient is 35 year old gentleman who has history of type 1 diabetes on insulin, diabetic retinopathy, hypertension, stage IIIb chronic kidney disease who presented to [facility] on 1/26 with nausea, vomiting, malaise and weakness.  Patient went to work in the morning, he did not feel well so taken to the emergency room.  Wife reports that he had poor appetite and felt body ache for the last few days. Significant events: 1/26 admitted at [facility] in shock felt to be hypovolemic  1/27 cards consult, nephrology consult. Surgery and vascular consults for HD access 1/28 hypotensive and transferred to ICU. AMS, sz-like episodes During tele neuro eval. Rec HD 1/29 Keppra, meningitis coverage, transfer [facility] for cEEG and LP 1/30 LP performed by IR, MRI showed multiple cerebral infarcts1/26 admitted, in shock felt to be hypovolemic   1/28 CT H no acute intracranial abnormality -- ill defined low density focus in L thalamus 1/28 CTA h/n> no LVO  2/5 significant improvement in mental status.  Able to swallow. 2/7 mental status normalized.  Physical strength improving and normalizing.  1/29 MRI Brain 1. Multiple scattered punctate acute to early subacute ischemic white matter infarcts involving the periventricular and deep white matter of both cerebral hemispheres, right greater than left. No associated hemorrhage or mass effect. 2. 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  2/8 ,wanting to go home with outpatient rehab and waiting for renal recovery and finalizing HD plans.  Assessment & Plan:   Active Problems:   Acute encephalopathy   Acute cerebral infarction ([place])   Diabetes mellitus type I ([place])   Stage 3 chronic kidney disease ([place])   Essential hypertension   Chronic systolic congestive  heart failure (HCC)   Dysphagia, post-stroke  Acute encephalopathy, multifactorial.  Also suspected viral encephalitis. Suspected secondary to uremia, multiple stroke. Mental status normalized now.  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 Negative for bacterial meningitis. Negative for autoimmune meningitis.  QuantiFERON and HIV negative.  Hepatitis panel negative.  Covid negative.  Streptococcus pneumoniae antigen negative.  Autoimmune work-up negative. Varicella-zoster PCR negative. No evidence of bacterial infection, antibiotics discontinued. 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  Apparently, labs were not collected on 1/30.   He completed 10 days of acyclovir therapy.  HSV PSR done on previous sample, results pending.   Seizure disorder: Reported seizure like episodes more likely uremic symptoms.  Seen by neurology.  Currently remains on Keppra. 3 days video EEG did not demonstrate any seizures. Discussed with neurology, they recommended Keppra 500 mg 2 times a day until outpatient neurology follow-up.   Neurology follow-up sent.  Shock: Suspect hypovolemic shock.  Transiently on vasopressors.  Now off vasopressors.  Normalized.   Acute stroke: Acute multiple territory stroke thought to be secondary to hypotension and hypoperfusion. Encephalopathic, no focal deficit.  Aggressive rehab. Refer to CIR. Aspirin and plavix for 3 weeks and aspirin alone as suggested by neurology. LDL 44, no statin needed  A1c 7.4, on insulin.  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  Last dialysis 2/5. Has right IJ temporary catheter. Foley catheter discontinued. Nephrology trying gentle hydration and expecting renal recovery back to baseline.  Acute hypoxemic respiratory failure secondary to altered mental status: Extubated to nasal  cannula oxygen on 2/1. Now on room air.  Acute on chronic combined heart failure: Previous EF 45%.  TEE with ejection fraction of 20 to 25%. Suspect acute cardiomyopathy stress. on coreg. Will not tolerate ACE or ARB due to renal function abnormalities. Limited echocardiogram 2/6 with ejection fraction of 25 to 30%. Avoiding diuretics with anticipating improvement in renal functions. Fluid balance achieved by dialysis.  Clinically euvolemic. Wants to follow-up with cardiology at [facility] health.  I have sent referral.   Type 1 diabetes: Currently only on sliding scale insulin.  Stable.  Continue mobility.  Wants to go home with outpatient rehab. Ambulatory referral to cardiology and neurology done. Discharge home after hemodialysis plan finalized.    DVT prophylaxis: SCDs Start: 06/21/20 1551   Code Status: Full code Family Communication: No family at bedside today.  Wife at bedside 2/7. Disposition Plan: Status is: Inpatient  Remains inpatient appropriate because: Pending improvement on renal function.   Dispo: The patient is from: Home              Anticipated d/c is to: Home.              Anticipated d/c date is: 2 to 3 days.              Patient currently is not medically stable to go home.   Difficult to place patient No   Consultants:   PCCM  Neurology  Surgery  Procedures:   Multiple procedures.  Now on hemodialysis.  Antimicrobials:  1/29 acyclovir> 2/7. 1/29 ampicillin> 2/3 1/29 rocephin> 2/3 1/29 vanc> 2/3   Subjective: Patient seen and examined.  He is excited that he is about to go home.  He was so happy to hear from your kidney doctor that he may even not need dialysis.  Trying to mobilize with the help of his wife and nursing staff.  Denies any nausea vomiting.  Eating and drinking well.  Urinating without problem.  Objective: Vitals:   06/30/20 2006 07/01/20 0010 07/01/20 0412 07/01/20 0812  BP: 136/82 (!) 146/93 (!) 147/81 (!) 153/84  Pulse:  78 78 82 84  Resp: '18 18 18 18  '$ Temp: 97.6 F (36.4 C) 97.8 F (36.6 C) 98.4 F (36.9 C) 99.1 F (37.3 C)  TempSrc:  Oral Oral Oral  SpO2: 99% 97% 97% 96%  Weight:        Intake/Output Summary (Last 24 hours) at 07/01/2020 0942 Last data filed at 07/01/2020 0300 Gross per 24 hour  Intake 200 ml  Output 1600 ml  Net -1400 ml   Filed Weights   06/26/20 1005 06/29/20 0500 06/30/20 0500  Weight: 103.9 kg 103.4 kg 103.5 kg    Examination:  General exam: Calm comfortable.  Conversant.  On room air. Respiratory system: Clear to auscultation. Respiratory effort normal. No added sounds. Cardiovascular system: S1 & S2 heard, RRR. No JVD, murmurs, rubs, gallops or clicks. No pedal edema. Gastrointestinal system: soft and non tender. BS present.  Central nervous system: Patient alert awake and oriented.  Able to have clear conversation, speech is clear. Extremities: generalized weakness that is mostly improving.  No focal deficits. Temporary right IJ catheter.   Data Reviewed: I have personally reviewed following labs and imaging studies  CBC: Recent Labs  Lab 06/24/20 1126 06/25/20 0537 06/25/20 1920 06/26/20 0335 06/27/20 0500  WBC 10.3 11.0* 10.5 10.9* 10.3  NEUTROABS 9.2* 7.9*  --  8.0* 7.4  HGB 10.1* 9.2* 9.1* 9.2* 8.5*  HCT 29.0* 27.1*  26.6* 27.0* 24.5*  MCV 89.8 92.8 91.7 94.7 92.5  PLT 325 317 312 286 [id number]   Basic Metabolic Panel: Recent Labs  Lab 06/27/20 0500 06/28/20 0602 06/29/20 0416 06/30/20 0248 07/01/20 0434  NA 138 141 140 142 140  K 3.6 3.6 3.5 3.6 3.6  CL 102 107 105 105 106  CO2 '24 25 27 26 22  '$ GLUCOSE 230* 217* 112* 87 121*  BUN 33* 40* 23* 31* 32*  CREATININE 4.86* 5.87* 4.22* 5.20* 5.53*  CALCIUM 8.2* 8.3* 8.1* 8.4* 8.3*  MG 2.3 2.3 2.1 2.2 1.9  PHOS 3.9 4.4 3.5 4.8* 5.0*   GFR: Estimated Creatinine Clearance: 22.8 mL/min (A) (by C-G formula based on SCr of 5.53 mg/dL (H)). Liver Function Tests: Recent Labs  Lab 06/27/20 0500  06/28/20 0602 06/29/20 0416 06/30/20 0248 07/01/20 0434  ALBUMIN 1.7* 1.6* 1.7* 1.8* 1.9*   No results for input(s): LIPASE, AMYLASE in the last 168 hours. No results for input(s): AMMONIA in the last 168 hours. Coagulation Profile: No results for input(s): INR, PROTIME in the last 168 hours. Cardiac Enzymes: No results for input(s): CKTOTAL, CKMB, CKMBINDEX, TROPONINI in the last 168 hours. BNP (last 3 results) No results for input(s): PROBNP in the last 8760 hours. HbA1C: No results for input(s): HGBA1C in the last 72 hours. CBG: Recent Labs  Lab 06/30/20 1729 06/30/20 2057 07/01/20 0008 07/01/20 0410 07/01/20 0613  GLUCAP 136* 160* 124* 111* 128*   Lipid Profile: Recent Labs    07/01/20 0434  TRIG 88   Thyroid Function Tests: No results for input(s): TSH, T4TOTAL, FREET4, T3FREE, THYROIDAB in the last 72 hours. Anemia Panel: No results for input(s): VITAMINB12, FOLATE, FERRITIN, TIBC, IRON, RETICCTPCT in the last 72 hours. Sepsis Labs: No results for input(s): PROCALCITON, LATICACIDVEN in the last 168 hours.  Recent Results (from the past 240 hour(s))  MRSA PCR Screening     Status: None   Collection Time: 06/21/20  3:55 PM   Specimen: Nasal Mucosa; Nasopharyngeal  Result Value Ref Range Status   MRSA by PCR NEGATIVE NEGATIVE Final    Comment:        The GeneXpert MRSA Assay (FDA approved for NASAL specimens only), is one component of a comprehensive MRSA colonization surveillance program. It is not intended to diagnose MRSA infection nor to guide or monitor treatment for MRSA infections. Performed at [facility], [place] [address] [place], [place] [address]   CSF culture     Status: None   Collection Time: 06/22/20  1:31 PM   Specimen: PATH Cytology CSF; Cerebrospinal Fluid  Result Value Ref Range Status   Specimen Description CSF  Final   Special Requests NONE  Final   Gram Stain   Final    WBC PRESENT,BOTH PMN AND MONONUCLEAR NO ORGANISMS  SEEN CYTOSPIN SMEAR    Culture   Final    NO GROWTH 3 DAYS Performed at [facility], [place] [address], [place], [place] [address]    Report Status 06/25/2020 FINAL  Final  Culture, fungus without smear     Status: None (Preliminary result)   Collection Time: 06/22/20  1:31 PM   Specimen: PATH Cytology CSF; Cerebrospinal Fluid  Result Value Ref Range Status   Specimen Description CSF  Final   Special Requests NONE  Final   Culture   Final    NO FUNGUS ISOLATED AFTER 7 DAYS Performed at [facility], [place] [address] [place], [place] [address]  Report Status PENDING  Incomplete         Radiology Studies: ECHOCARDIOGRAM LIMITED  Result Date: 06/29/2020    ECHOCARDIOGRAM LIMITED REPORT   Patient Name:   HARCE CANTU Date of Exam: 06/29/2020 Medical Rec #:  [id number]        Height:       72.0 in Accession #:    [id number]       Weight:       228.0 lb Date of Birth:  [birth year]         BSA:          2.252 m Patient Age:    62 years         BP:           119/78 mmHg Patient Gender: M                HR:           81 bpm. Exam Location:  Inpatient Procedure: Limited Echo, Limited Color Doppler and Intracardiac Opacification            Agent Indications:    [id number] Acute on chronic systolic (congestive) heart failure  History:        Patient has prior history of Echocardiogram examinations, most                 recent 06/23/2020. Risk Factors:Diabetes.  Sonographer:    Jonelle Sidle Dance Referring Phys: [id number] [place]  1. Since th elast study on 06/19/2020 LVEF has decreased from 40% to 25-30% with diffuse hypokinesis. RVEF is moderately decreased.  2. Left ventricular ejection fraction, by estimation, is 25 to 30%. The left ventricle has severely decreased function. The left ventricle demonstrates global hypokinesis. The left ventricular internal cavity size was severely dilated. Left ventricular diastolic function could not be evaluated.  3. Right ventricular systolic  function is moderately reduced. The right ventricular size is mildly enlarged.  4. A small pericardial effusion is present. The pericardial effusion is posterior to the left ventricle.  5. Mild mitral valve regurgitation.  6. Aortic valve regurgitation is trivial. FINDINGS  Left Ventricle: Left ventricular ejection fraction, by estimation, is 25 to 30%. The left ventricle has severely decreased function. The left ventricle demonstrates global hypokinesis. Definity contrast agent was given IV to delineate the left ventricular endocardial borders. The left ventricular internal cavity size was severely dilated. Left ventricular diastolic function could not be evaluated. Right Ventricle: The right ventricular size is mildly enlarged. Right ventricular systolic function is moderately reduced. Pericardium: A small pericardial effusion is present. The pericardial effusion is posterior to the left ventricle. Mitral Valve: Mild mitral valve regurgitation. Tricuspid Valve: Tricuspid valve regurgitation is mild. Aortic Valve: Aortic valve regurgitation is trivial. LEFT VENTRICLE PLAX 2D LVIDd:         6.20 cm LVIDs:         5.10 cm LV PW:         1.30 cm LV IVS:        1.20 cm LVOT diam:     2.30 cm LVOT Area:     4.15 cm  LEFT ATRIUM         Index LA diam:    4.20 cm 1.86 cm/m   AORTA Ao Root diam: 3.90 cm  SHUNTS Systemic Diam: 2.30 cm Ena Dawley MD Electronically signed by Ena Dawley MD Signature Date/Time: 06/29/2020/3:30:13 PM    Final         Scheduled Meds: .  aspirin  81 mg Oral Daily  . carvedilol  25 mg Oral BID WC  . chlorhexidine  15 mL Mouth Rinse BID  . Chlorhexidine Gluconate Cloth  6 each Topical Q0600  . clopidogrel  75 mg Oral Daily  . darbepoetin (ARANESP) injection - DIALYSIS  60 mcg Intravenous Q Sat-HD  . famotidine  20 mg Oral QHS  . insulin aspart  0-9 Units Subcutaneous TID AC & HS  . levETIRAcetam  500 mg Oral BID  . mouth rinse  15 mL Mouth Rinse q12n4p   Continuous  Infusions: . sodium chloride       LOS: 10 days    Time spent: 30 minutes    Barb Merino, MD [facility] Pager [phone]

## 2020-07-01 NOTE — Progress Notes (Signed)
Patient ID: Adrian Evans, male   DOB: [birth year], 35 y.o.   MRN: [id number] [facility] Progress Note   Assessment/ Plan:   1. Acute kidney Injury on chronic kidney disease stage IV: Baseline creatinine suspected to be in the mid 3 range (underlying CKD likely from DM) and presented to the hospital with acute kidney injury in the setting of cardiogenic versus hypovolemic shock and ATN.  Initially underwent CRRT and was transitioned to intermittent hemodialysis after becoming more clinically/hemodynamically stable.  Patient received dialysis on 2/3.  Patient had total urine output of 2050 mL over the last 24 hours.  Creatinine increased to 5.53 from 4.22 from 2/7.  BUN 32, Phos 5.0, albumin 1.9.  We will continue to monitor urine output and recheck renal function panel tomorrow.  No need for dialysis today. 2.  Acute metabolic encephalopathy: Suspected to be multifactorial in origin with severe azotemia/uremia but patient also noted to have had multiple scattered punctate cerebellar infarcts on MRI raising some concern. 3.  Congestive heart failure with reduced ejection fraction: 4.  Anemia: Secondary to chronic kidney disease and exacerbated by acute/critical illness 5.  Secondary hyperparathyroidism: Calcium and phosphorus level within acceptable limits; continue to monitor with sequential labs.  Subjective:   No acute events overnight.  Patient is eager to go home.  Reports great urine output and that he has been drinking plenty of fluids.   Objective:   BP (!) 153/84 (BP Location: Left Arm)   Pulse 84   Temp 99.1 F (37.3 C) (Oral)   Resp 18   Wt 103.5 kg   SpO2 96%   BMI 30.95 kg/m   Intake/Output Summary (Last 24 hours) at 07/01/2020 0847 Last data filed at 07/01/2020 0300 Gross per 24 hour  Intake 200 ml  Output 1600 ml  Net -1400 ml   Weight change:   Physical Exam: Gen: Laying comfortably in acute distress.  Converses without difficulty. CVS: Pulse regular  rhythm, normal rate, S1 and S2 normal Resp: Lungs clear to auscultation bilaterally, no gallop breathing Abd: Soft, flat, nontender, bowel sounds normal Ext: No lower extremity edema  Imaging: ECHOCARDIOGRAM LIMITED  Result Date: 06/29/2020    ECHOCARDIOGRAM LIMITED REPORT   Patient Name:   Adrian Evans Date of Exam: 06/29/2020 Medical Rec #:  [id number]        Height:       72.0 in Accession #:    [id number]       Weight:       228.0 lb Date of Birth:  Dec 07, 1985         BSA:          2.252 m Patient Age:    89 years         BP:           119/78 mmHg Patient Gender: M                HR:           81 bpm. Exam Location:  Inpatient Procedure: Limited Echo, Limited Color Doppler and Intracardiac Opacification            Agent Indications:    [id number] Acute on chronic systolic (congestive) heart failure  History:        Patient has prior history of Echocardiogram examinations, most                 recent 06/23/2020. Risk Factors:Diabetes.  Sonographer:    Photographer Referring  Phys: [id number] Barb Merino IMPRESSIONS  1. Since th elast study on 06/19/2020 LVEF has decreased from 40% to 25-30% with diffuse hypokinesis. RVEF is moderately decreased.  2. Left ventricular ejection fraction, by estimation, is 25 to 30%. The left ventricle has severely decreased function. The left ventricle demonstrates global hypokinesis. The left ventricular internal cavity size was severely dilated. Left ventricular diastolic function could not be evaluated.  3. Right ventricular systolic function is moderately reduced. The right ventricular size is mildly enlarged.  4. A small pericardial effusion is present. The pericardial effusion is posterior to the left ventricle.  5. Mild mitral valve regurgitation.  6. Aortic valve regurgitation is trivial. FINDINGS  Left Ventricle: Left ventricular ejection fraction, by estimation, is 25 to 30%. The left ventricle has severely decreased function. The left ventricle demonstrates global  hypokinesis. Definity contrast agent was given IV to delineate the left ventricular endocardial borders. The left ventricular internal cavity size was severely dilated. Left ventricular diastolic function could not be evaluated. Right Ventricle: The right ventricular size is mildly enlarged. Right ventricular systolic function is moderately reduced. Pericardium: A small pericardial effusion is present. The pericardial effusion is posterior to the left ventricle. Mitral Valve: Mild mitral valve regurgitation. Tricuspid Valve: Tricuspid valve regurgitation is mild. Aortic Valve: Aortic valve regurgitation is trivial. LEFT VENTRICLE PLAX 2D LVIDd:         6.20 cm LVIDs:         5.10 cm LV PW:         1.30 cm LV IVS:        1.20 cm LVOT diam:     2.30 cm LVOT Area:     4.15 cm  LEFT ATRIUM         Index LA diam:    4.20 cm 1.86 cm/m   AORTA Ao Root diam: 3.90 cm  SHUNTS Systemic Diam: 2.30 cm Ena Dawley MD Electronically signed by Ena Dawley MD Signature Date/Time: 06/29/2020/3:30:13 PM    Final     Labs: BMET Recent Labs  Lab 06/25/20 2000 06/26/20 0335 06/27/20 0500 06/28/20 0602 06/29/20 0416 06/30/20 0248 07/01/20 0434  NA 137 136 138 141 140 142 140  K 3.9 4.6 3.6 3.6 3.5 3.6 3.6  CL 102 103 102 107 105 105 106  CO2 22 18* '24 25 27 26 22  '$ GLUCOSE 190* 197* 230* 217* 112* 87 121*  BUN 39* 44* 33* 40* 23* 31* 32*  CREATININE 5.60* 5.88* 4.86* 5.87* 4.22* 5.20* 5.53*  CALCIUM 8.4* 8.4* 8.2* 8.3* 8.1* 8.4* 8.3*  PHOS 5.3* 5.2* 3.9 4.4 3.5 4.8* 5.0*   CBC Recent Labs  Lab 06/24/20 1126 06/25/20 0537 06/25/20 1920 06/26/20 0335 06/27/20 0500  WBC 10.3 11.0* 10.5 10.9* 10.3  NEUTROABS 9.2* 7.9*  --  8.0* 7.4  HGB 10.1* 9.2* 9.1* 9.2* 8.5*  HCT 29.0* 27.1* 26.6* 27.0* 24.5*  MCV 89.8 92.8 91.7 94.7 92.5  PLT 325 317 312 286 290    Medications:    . aspirin  81 mg Oral Daily  . carvedilol  25 mg Oral BID WC  . chlorhexidine  15 mL Mouth Rinse BID  . Chlorhexidine  Gluconate Cloth  6 each Topical Q0600  . clopidogrel  75 mg Oral Daily  . darbepoetin (ARANESP) injection - DIALYSIS  60 mcg Intravenous Q Sat-HD  . famotidine  20 mg Oral QHS  . insulin aspart  0-9 Units Subcutaneous TID AC & HS  . levETIRAcetam  500 mg Oral BID  .  mouth rinse  15 mL Mouth Rinse q12n4p      Gifford Shave, MD 07/01/2020, 8:47 AM

## 2020-07-01 NOTE — TOC Progression Note (Signed)
Transition of Care [facility]) - Progression Note    Patient Details  Name: WESTLEY BLASS MRN: [id number] Date of Birth: 1986/02/09  [facility] of [facility] CM/SW Contact  Pollie Friar, RN Phone Number: 07/01/2020, 12:11 PM  Clinical Narrative:    CM met with the patient and his spouse yesterday to go over the new recommendations. They live in [place] but prefer to attend outpatient therapy in [place]. Orders in Epic and information on the AVS. DME for home has been delivered to the room per [employer]. TOC following for further d/c needs.   Expected Discharge Plan: OP Rehab Barriers to Discharge: Continued Medical Work up  Expected Discharge Plan and Services Expected Discharge Plan: OP Rehab   Discharge Planning Services: CM Consult Post Acute Care Choice: Durable Medical Equipment Living arrangements for the past 2 months: Single Family Home                 DME Arranged: 3-N-1,Walker rolling,Wheelchair manual DME Agency: [employer] Date DME Agency Contacted: 06/30/20   Representative spoke with at DME Agency: Peggye [employer]             Social Determinants of Health ([place]) Interventions    Readmission Risk Interventions Readmission Risk Prevention Plan 06/23/2020  Transportation Screening Complete  PCP or Specialist Appt within 3-5 Days Complete  HRI or [place] Complete  Social Work Consult for [place] Planning/Counseling Complete  Palliative Care Screening Not Applicable  Medication Review [employer] photographer) Referral to Pharmacy  Some recent data might be hidden

## 2020-07-01 NOTE — Progress Notes (Signed)
Physical Therapy Treatment Patient Details Name: Adrian Evans MRN: [id number] DOB: 1985-06-09 Today's Date: 07/01/2020    History of Present Illness Pt is 35 yo M admitted to [facility] 1/26 w n/v malaise, weakness. Hypotensive in ED and volume resuscitated. Worse AMS without clear etiology, transferring to [facility] 1/29 for LP and cEEG. MRI revealed multiple scattered punctate acute ishcemic infarcts involving both cerebral hemisphers, R greater than L and a small remote L PCA territory infarct, with additional small remote L thalamic and R cerebellar infarcts. Pt extubated 2/1.  Per MD note: " Acute encephalopathy, multifactorial- Also suspected viral encephalitis"    PT Comments    Pt making excellent progress and had met all of his goals.  Goals were updated.  He was able to ambulate increased distances and perform stairs.  Pt with safe balance with RW and is ok to ambulate with family and RW from PT perspective (Pt aware to use RW and if family not present call nurse).  Did attempt progression to no AD to further balance, but pt mildly unsteady.  Continue to recommend RW for home.     Follow Up Recommendations  Outpatient PT     Equipment Recommendations  Rolling walker with 5" wheels    Recommendations for Other Services       Precautions / Restrictions Precautions Precautions: Fall    Mobility  Bed Mobility Overal bed mobility: Modified Independent Bed Mobility: Supine to Sit;Sit to Supine     Supine to sit: Supervision Sit to supine: Supervision      Transfers Overall transfer level: Needs assistance Equipment used: Rolling walker (2 wheeled) Transfers: Sit to/from Stand Sit to Stand: Min guard;Supervision         General transfer comment: Min guard progressed to supervision; performed x 3; cues for controlled descent  Ambulation/Gait Ambulation/Gait assistance: Min guard;Min assist Gait Distance (Feet): 300 Feet (300' then 120') Assistive device: Rolling walker (2  wheeled);None;1 person hand held assist Gait Pattern/deviations: Step-through pattern;Ataxic;Narrow base of support Gait velocity: decreased   General Gait Details: Pt had no episodes of scissoring today.  Pt with very mild ataxic gait notable with occasional heavy step.  He ambulated 300' with RW without LOB and performed some dynamic task (see balance) with min g.  Attempted progression to no AD/HHA at times and pt able to ambulate 120' with min A for balance and with some unsteadiness/increased ataxia   Stairs Stairs: Yes Stairs assistance: Min guard Stair Management: Step to pattern;Two rails;Forwards;One rail Right;Sideways Number of Stairs: 10 General stair comments: Performed 5 steps with bil rails and min guard.  Then advanced to 5 steps with R rail going sideways.   Wheelchair Mobility    Modified Rankin (Stroke Patients Only) Modified Rankin (Stroke Patients Only) Pre-Morbid Rankin Score: No symptoms Modified Rankin: Moderate disability     Balance Overall balance assessment: Needs assistance Sitting-balance support: Feet supported;No upper extremity supported Sitting balance-Leahy Scale: Normal     Standing balance support: No upper extremity supported Standing balance-Leahy Scale: Fair Standing balance comment: Able to static stand without AD.  Demonstrated unsteadiness with gait without AD               High Level Balance Comments: Performed head turns, marching, changing speeds, turning, and stops with RW and min guard and no LOB            Cognition Arousal/Alertness: Awake/alert Behavior During Therapy: WFL for tasks assessed/performed Overall Cognitive Status: Impaired/Different from baseline Area of Impairment:  Attention;Memory                   Current Attention Level: Alternating Memory: Decreased short-term memory Following Commands: Follows multi-step commands consistently   Awareness: Emergent Problem Solving: Slow  processing General Comments: Pt demonstrating mild deficits with basic task: Needed cues to find his room after walking, and mild increase in time to respond      Exercises      General Comments        Pertinent Vitals/Pain Pain Assessment: No/denies pain    Home Living                      Prior Function            PT Goals (current goals can now be found in the care plan section) Acute Rehab PT Goals Patient Stated Goal: to go home PT Goal Formulation: With patient/family Time For Goal Achievement: 07/09/20 Potential to Achieve Goals: Good Additional Goals Additional Goal #1: Pt will score >19 on DGI to indicate low fall risk Progress towards PT goals: Goals met and updated - see care plan    Frequency    Min 4X/week      PT Plan Current plan remains appropriate;Equipment recommendations need to be updated    Co-evaluation              AM-PAC PT "6 Clicks" Mobility   Outcome Measure  Help needed turning from your back to your side while in a flat bed without using bedrails?: None Help needed moving from lying on your back to sitting on the side of a flat bed without using bedrails?: None Help needed moving to and from a bed to a chair (including a wheelchair)?: A Little Help needed standing up from a chair using your arms (e.g., wheelchair or bedside chair)?: A Little Help needed to walk in hospital room?: A Little Help needed climbing 3-5 steps with a railing? : A Little 6 Click Score: 20    End of Session Equipment Utilized During Treatment: Gait belt Activity Tolerance: Patient tolerated treatment well Patient left: with call bell/phone within reach;with family/visitor present;in bed (Pt safe to ambulate with family and RW so left alarm off - RN aware.  Also, educated pt and wife on the recommendation) Nurse Communication: Mobility status PT Visit Diagnosis: Unsteadiness on feet (R26.81);Muscle weakness (generalized) (M62.81);Difficulty in  walking, not elsewhere classified (R26.2);Other symptoms and signs involving the nervous system (R29.898)     Time: 1030-1053 PT Time Calculation (min) (ACUTE ONLY): 23 min  Charges:  $Gait Training: 23-37 mins                     Abran Richard, PT Acute Rehab Services Pager [phone] [facility] [place] 07/01/2020, 12:34 PM

## 2020-07-02 LAB — RENAL FUNCTION PANEL
Albumin: 1.9 g/dL — ABNORMAL LOW (ref 3.5–5.0)
Anion gap: 10 (ref 5–15)
BUN: 33 mg/dL — ABNORMAL HIGH (ref 6–20)
CO2: 22 mmol/L (ref 22–32)
Calcium: 8.1 mg/dL — ABNORMAL LOW (ref 8.9–10.3)
Chloride: 108 mmol/L (ref 98–111)
Creatinine, Ser: 5.69 mg/dL — ABNORMAL HIGH (ref 0.61–1.24)
GFR, Estimated: 12 mL/min — ABNORMAL LOW (ref >60)
Glucose, Bld: 118 mg/dL — ABNORMAL HIGH (ref 70–99)
Phosphorus: 5.5 mg/dL — ABNORMAL HIGH (ref 2.5–4.6)
Potassium: 3.6 mmol/L (ref 3.5–5.1)
Sodium: 140 mmol/L (ref 135–145)

## 2020-07-02 LAB — MAGNESIUM: Magnesium: 2 mg/dL (ref 1.7–2.4)

## 2020-07-02 LAB — GLUCOSE, CAPILLARY
Glucose-Capillary: 102 mg/dL — ABNORMAL HIGH (ref 70–99)
Glucose-Capillary: 162 mg/dL — ABNORMAL HIGH (ref 70–99)
Glucose-Capillary: 129 mg/dL — ABNORMAL HIGH (ref 70–99)
Glucose-Capillary: 130 mg/dL — ABNORMAL HIGH (ref 70–99)

## 2020-07-02 MED ORDER — INSULIN ASPART 100 UNIT/ML ~~LOC~~ SOLN
0.0000 [IU] | Freq: Three times a day (TID) | SUBCUTANEOUS | Status: DC
  Administered 2020-07-02: 1 [IU] via SUBCUTANEOUS
  Administered 2020-07-02: 2 [IU] via SUBCUTANEOUS
  Administered 2020-07-03: 1 [IU] via SUBCUTANEOUS

## 2020-07-02 NOTE — Progress Notes (Signed)
Patient refused 0000 and 0600 BP checks. Also refusing 0630 CBG  Checks and seems to be agitated when staff wakes him for care. All other care completed.

## 2020-07-02 NOTE — Progress Notes (Signed)
Occupational Therapy Treatment Patient Details Name: Adrian Evans MRN: [id number] DOB: [birth year] Today's Date: 07/02/2020    History of present illness Pt is 35 yo M admitted to [facility] 1/26 w n/v malaise, weakness. Hypotensive in ED and volume resuscitated. Worse AMS without clear etiology, transferring to [facility] 1/29 for LP and cEEG. MRI revealed multiple scattered punctate acute ishcemic infarcts involving both cerebral hemisphers, R greater than L and a small remote L PCA territory infarct, with additional small remote L thalamic and R cerebellar infarcts. Pt extubated 2/1.  Per MD note: " Acute encephalopathy, multifactorial- Also suspected viral encephalitis"   OT comments  Pt making steady progress towards OT goals this session. Session focus on administration of pillbox assessment.  Pt failed the assessment, demonstrating poor planning, mental flexibility, suboptimal search strategies, concrete thinking and the inability to multitask. Pt had a total of > than 3 errors where more than 3 errors is considered a fail.Howeever, when orienting pt to errors pt did have the awareness to notice his mistakes stating, "oh I see were I messed up." Pts wife present during session and good conversation was had about having supervision with medication mgmt and education provided on available pillboxes for IADL task. Pt would continue to benefit from skilled occupational therapy while admitted and after d/c to address the below listed limitations in order to improve overall functional mobility and facilitate independence with BADL participation. DC plan remains appropriate, will follow acutely per POC.      Follow Up Recommendations  Supervision/Assistance - 24 hour;Outpatient OT    Equipment Recommendations  None recommended by OT    Recommendations for Other Services      Precautions / Restrictions Precautions Precautions: Fall Restrictions Weight Bearing Restrictions: No       Mobility Bed  Mobility                  Transfers                      Balance                                           ADL either performed or assessed with clinical judgement   ADL                                         General ADL Comments: session focus on administration of pillbox assessment     Vision       Perception     Praxis      Cognition Arousal/Alertness: Awake/alert Behavior During Therapy: WFL for tasks assessed/performed Overall Cognitive Status: Impaired/Different from baseline Area of Impairment: Memory;Awareness                     Memory: Decreased short-term memory     Awareness: Emergent   General Comments: pt completed pill box assessment during session, pt failed asessment but when reviewing mistakes pt with awareness to deficits and able to correctly porblem solve how to correct mistakes with cues        Exercises     Shoulder Instructions       General Comments      Pertinent Vitals/ Pain       Pain Assessment: No/denies pain  Home Living                                          Prior Functioning/Environment              Frequency  Min 2X/week        Progress Toward Goals  OT Goals(current goals can now be found in the care plan section)  Progress towards OT goals: Progressing toward goals  Acute Rehab OT Goals Patient Stated Goal: to go home OT Goal Formulation: With patient Time For Goal Achievement: 07/10/20 Potential to Achieve Goals: Good  Plan Discharge plan remains appropriate;Frequency remains appropriate    Co-evaluation                 AM-PAC OT "6 Clicks" Daily Activity     Outcome Measure   Help from another person eating meals?: None Help from another person taking care of personal grooming?: None Help from another person toileting, which includes using toliet, bedpan, or urinal?: A Little Help from another person  bathing (including washing, rinsing, drying)?: A Little Help from another person to put on and taking off regular upper body clothing?: None Help from another person to put on and taking off regular lower body clothing?: None 6 Click Score: 22    End of Session    OT Visit Diagnosis: Unsteadiness on feet (R26.81);Other abnormalities of gait and mobility (R26.89);Muscle weakness (generalized) (M62.81);Ataxia, unspecified (R27.0);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

## 2020-07-02 NOTE — Progress Notes (Signed)
Patient ID: Adrian Evans, male   DOB: January 04, 1986, 35 y.o.   MRN: [id number] [facility] Progress Note   Assessment/ Plan:   1. Acute kidney Injury on chronic kidney disease stage IV: Baseline creatinine suspected to be in the mid 3 range (underlying CKD likely from DM) and presented to the hospital with acute kidney injury in the setting of cardiogenic versus hypovolemic shock and ATN.  Initially underwent CRRT and was transitioned to intermittent hemodialysis after becoming more clinically/hemodynamically stable.  Patient received dialysis on 2/3. Patient with urine output of 2106 mL over the last 24 hours. Creatinine slightly increased to 5.69 from 5.53. BUN 33, K3.6, Phos 5.5, mag 2.0. We will continue to monitor his urine output and creatinine.  No need for dialysis today.  Could possibly remove dialysis access tomorrow if kidney function remains stable or improves. 2.  Acute metabolic encephalopathy: Suspected to be multifactorial in origin with severe azotemia/uremia but patient also noted to have had multiple scattered punctate cerebellar infarcts on MRI raising some concern.  Resolved at this time with patient back at baseline. 3.  Congestive heart failure with reduced ejection fraction: 4.  Anemia: Secondary to chronic kidney disease and exacerbated by acute/critical illness 5.  Secondary hyperparathyroidism: Calcium and phosphorus level within acceptable limits; continue to monitor with sequential labs.  Subjective:   Patient reports he is doing well and has no concerns at this time.  Has questions regarding when he may go home.   Objective:   BP (!) 149/91 (BP Location: Left Arm)   Pulse 72   Temp 97.7 F (36.5 C) (Oral)   Resp 18   Wt 106.1 kg   SpO2 98%   BMI 31.72 kg/m   Intake/Output Summary (Last 24 hours) at 07/02/2020 0757 Last data filed at 07/02/2020 0600 Gross per 24 hour  Intake 234 ml  Output 2106 ml  Net -1872 ml   Weight change:   Physical  Exam: Gen: Sitting comfortably in chair next to bed, no acute distress, conversive CVS: Regular rate and rhythm, no murmurs appreciated Resp: Normal work of breathing with lungs clear to auscultation bilaterally Abd: Soft, nontender, positive bowel sounds Ext: No lower extremity edema  Imaging: No results found.  Labs: BMET Recent Labs  Lab 06/26/20 0335 06/27/20 0500 06/28/20 0602 06/29/20 0416 06/30/20 0248 07/01/20 0434 07/02/20 0500  NA 136 138 141 140 142 140 140  K 4.6 3.6 3.6 3.5 3.6 3.6 3.6  CL 103 102 107 105 105 106 108  CO2 18* '24 25 27 26 22 22  '$ GLUCOSE 197* 230* 217* 112* 87 121* 118*  BUN 44* 33* 40* 23* 31* 32* 33*  CREATININE 5.88* 4.86* 5.87* 4.22* 5.20* 5.53* 5.69*  CALCIUM 8.4* 8.2* 8.3* 8.1* 8.4* 8.3* 8.1*  PHOS 5.2* 3.9 4.4 3.5 4.8* 5.0* 5.5*   CBC Recent Labs  Lab 06/25/20 1920 06/26/20 0335 06/27/20 0500  WBC 10.5 10.9* 10.3  NEUTROABS  --  8.0* 7.4  HGB 9.1* 9.2* 8.5*  HCT 26.6* 27.0* 24.5*  MCV 91.7 94.7 92.5  PLT 312 286 290    Medications:    . aspirin  81 mg Oral Daily  . carvedilol  25 mg Oral BID WC  . chlorhexidine  15 mL Mouth Rinse BID  . Chlorhexidine Gluconate Cloth  6 each Topical Q0600  . clopidogrel  75 mg Oral Daily  . darbepoetin (ARANESP) injection - DIALYSIS  60 mcg Intravenous Q Sat-HD  . famotidine  20 mg  Oral QHS  . insulin aspart  0-9 Units Subcutaneous TID AC & HS  . levETIRAcetam  500 mg Oral BID  . mouth rinse  15 mL Mouth Rinse q12n4p      Gifford Shave, MD 07/02/2020, 7:57 AM

## 2020-07-02 NOTE — Plan of Care (Addendum)
  Problem: Education: Goal: Expressions of having a comfortable level of knowledge regarding the disease process will increase Outcome: Progressing   Problem: Coping: Goal: Ability to adjust to condition or change in health will improve Outcome: Progressing Goal: Ability to identify appropriate support needs will improve Outcome: Progressing   Problem: Health Behavior/Discharge Planning: Goal: Compliance with prescribed medication regimen will improve Outcome: Progressing   Problem: Medication: Goal: Risk for medication side effects will decrease Outcome: Progressing   Problem: Clinical Measurements: Goal: Complications related to the disease process, condition or treatment will be avoided or minimized Outcome: Progressing Goal: Diagnostic test results will improve Outcome: Progressing   Problem: Safety: Goal: Verbalization of understanding the information provided will improve Outcome: Progressing   Problem: Self-Concept: Goal: Level of anxiety will decrease Outcome: Progressing Goal: Ability to verbalize feelings about condition will improve Outcome: Progressing   Problem: Education: Goal: Knowledge of General Education information will improve Description: Including pain rating scale, medication(s)/side effects and non-pharmacologic comfort measures Outcome: Progressing   Problem: Health Behavior/Discharge Planning: Goal: Ability to manage health-related needs will improve Outcome: Progressing   Problem: Clinical Measurements: Goal: Ability to maintain clinical measurements within normal limits will improve Outcome: Progressing Goal: Will remain free from infection Outcome: Progressing Goal: Diagnostic test results will improve Outcome: Progressing Goal: Respiratory complications will improve Outcome: Progressing Goal: Cardiovascular complication will be avoided Outcome: Progressing   Problem: Activity: Goal: Risk for activity intolerance will  decrease Outcome: Progressing   Problem: Nutrition: Goal: Adequate nutrition will be maintained Outcome: Progressing   Problem: Coping: Goal: Level of anxiety will decrease Outcome: Progressing   Problem: Elimination: Goal: Will not experience complications related to bowel motility Outcome: Progressing Goal: Will not experience complications related to urinary retention Outcome: Progressing   Problem: Pain Managment: Goal: General experience of comfort will improve Outcome: Progressing   Problem: Safety: Goal: Ability to remain free from injury will improve Outcome: Progressing   Problem: Skin Integrity: Goal: Risk for impaired skin integrity will decrease Outcome: Progressing   Problem: Activity: Goal: Ability to tolerate increased activity will improve Outcome: Progressing   Problem: Respiratory: Goal: Ability to maintain a clear airway and adequate ventilation will improve Outcome: Progressing   Problem: Role Relationship: Goal: Method of communication will improve Outcome: Progressing   Problem: Safety: Goal: Non-violent Restraint(s) Outcome: Progressing   Problem: Safety: Goal: Non-violent Restraint(s) Outcome: Progressing

## 2020-07-02 NOTE — Progress Notes (Signed)
PROGRESS NOTE    Adrian Evans  [id number] DOB: [birth year] DOA: 06/21/2020 PCP: Lenard Simmer, MD    Brief Narrative:  35 year old gentleman who has history of type 1 diabetes on insulin, diabetic retinopathy, hypertension, stage IIIb chronic kidney disease who presented to [facility] on 1/26 with nausea, vomiting, malaise and weakness.  Patient went to work in the morning, he did not feel well so taken to the emergency room.  Wife reports that he had poor appetite and felt body ache for the last few days. Significant events: 1/26 admitted at [facility] in shock felt to be hypovolemic  1/27 cards consult, nephrology consult. Surgery and vascular consults for HD access 1/28 hypotensive and transferred to ICU. AMS, sz-like episodes During tele neuro eval. Rec HD 1/29 Keppra, meningitis coverage, [facility] and LP 1/30 LP performed by IR, MRI showed multiple cerebral infarcts1/26 admitted, in shock felt to be hypovolemic   1/28 CT H no acute intracranial abnormality -- ill defined low density focus in L thalamus 1/28 CTA h/n>no LVO  2/5 significant improvement in mental status.  Able to swallow. 2/7 mental status normalized.  Physical strength improving and normalizing.  1/29 MRI Brain 1. Multiple scattered punctate acute to early subacute ischemic white matter infarcts involving the periventricular and deep white matter of both cerebral hemispheres, right greater than left. No associated hemorrhage or mass effect. 2. 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  2/8 ,wanting to go home with outpatient rehab and waiting for renal recovery and finalizing HD plans.  Assessment & Plan:   Active Problems:   Acute encephalopathy   Acute cerebral infarction ([place])   Diabetes mellitus type I ([place])   Stage 3 chronic kidney disease ([place])   Essential hypertension   Chronic systolic congestive heart  failure ([facility])   Dysphagia, post-stroke  Acute encephalopathy, multifactorial.  Also suspected viral encephalitis. Suspected secondary to uremia, multiple stroke. 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 Negative for bacterial meningitis. Negative for autoimmune meningitis.  QuantiFERON and HIV negative.  Hepatitis panel negative.  Covid negative.  Streptococcus pneumoniae antigen negative.  Autoimmune work-up negative. Varicella-zoster PCR negative. No evidence of bacterial infection, antibiotics discontinued. 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  Apparently, labs were not collected on 1/30.   He completed 10 days of acyclovir therapy.  HSV PSR done on previous sample, reviewed results, neg on 2/5  Seizure disorder: Reported seizure like episodes more likely uremic symptoms.  Seen by neurology.  Currently remains on Keppra. 3 days video EEG did not demonstrate any seizures. Discussed with neurology, they recommended Keppra 500 mg 2 times a day until outpatient neurology follow-up.   Neurology to f/u  Shock: Suspect hypovolemic shock.  Transiently on vasopressors.  Now off vasopressors.  Normalized.   Acute stroke: Acute multiple territory stroke thought to be secondary to hypotension and hypoperfusion. Encephalopathic, no focal deficit.  Aggressive rehab. Refer to CIR. Aspirin and plavix for 3 weeks and aspirin alone as suggested by neurology. LDL 44, no statin needed  A1c 7.4, on insulin.  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  Last dialysis 2/5. Has right IJ temporary catheter. Foley catheter discontinued. Nephrology trying gentle hydration and expecting renal recovery back to baseline. Per Nephrology, no plan for HD Repeat bmet in AM  Acute hypoxemic respiratory  failure  secondary to altered mental status: Extubated to nasal cannula oxygen on 2/1. Now on room air.  Acute on chronic combined heart failure: Previous EF 45%.  TEE with ejection fraction of 20 to 25%. Suspect acute cardiomyopathy stress. on coreg. Will not tolerate ACE or ARB due to renal function abnormalities. Limited echocardiogram 2/6 with ejection fraction of 25 to 30%. Avoiding diuretics with anticipating improvement in renal functions. Fluid balance achieved by dialysis.  Clinically euvolemic. Wants to follow-up with cardiology at [facility].  Dr. Sloan Leiter has sent referral  Type 1 diabetes: Currently only on sliding scale insulin.  Stable.  DVT prophylaxis: SCD's Code Status: Full Family Communication: Pt in room, family at bedside  Status is: Inpatient  Remains inpatient appropriate because:Unsafe d/c plan and Inpatient level of care appropriate due to severity of illness  Dispo:  Patient From: Home  Planned Disposition: Home  Expected discharge date: 07/02/2020  Medically stable for discharge: No      Consultants:   PCCM  Neurology  Surgery  Procedures:     Antimicrobials: Anti-infectives (From admission, onward)   Start     Dose/Rate Route Frequency Ordered Stop   06/26/20 1000  vancomycin (VANCOCIN) IVPB 1000 mg/200 mL premix        1,000 mg 200 mL/hr over 60 Minutes Intravenous  Once 06/26/20 0914 06/26/20 1116   06/26/20 0916  vancomycin (VANCOCIN) 1-5 GM/200ML-% IVPB       Note to Pharmacy: Kalman Shan   : cabinet override      06/26/20 0916 06/26/20 2129   06/26/20 0600  acyclovir (ZOVIRAX) 390 mg in dextrose 5 % 100 mL IVPB        5 mg/kg  77.6 kg (Ideal) 107.8 mL/hr over 60 Minutes Intravenous Every 24 hours 06/25/20 0937 06/30/20 1037   06/25/20 1800  ampicillin (OMNIPEN) 2 g in sodium chloride 0.9 % 100 mL IVPB  Status:  Discontinued        2 g 300 mL/hr over 20 Minutes Intravenous Every 12 hours 06/25/20 0937 06/26/20 1422   06/25/20 0935   vancomycin variable dose per unstable renal function (pharmacist dosing)  Status:  Discontinued         Does not apply See admin instructions 06/25/20 0937 06/26/20 1422   06/23/20 1700  acyclovir (ZOVIRAX) 775 mg in dextrose 5 % 150 mL IVPB  Status:  Discontinued        10 mg/kg  77.6 kg (Ideal) 165.5 mL/hr over 60 Minutes Intravenous Every 12 hours 06/23/20 0846 06/25/20 0937   06/22/20 2200  vancomycin (VANCOCIN) IVPB 1000 mg/200 mL premix  Status:  Discontinued        1,000 mg 200 mL/hr over 60 Minutes Intravenous Every 24 hours 06/21/20 1950 06/25/20 0937   06/22/20 0500  acyclovir (ZOVIRAX) 385 mg in dextrose 5 % 100 mL IVPB  Status:  Discontinued        385 mg 107.7 mL/hr over 60 Minutes Intravenous Every 12 hours 06/21/20 1950 06/23/20 0846   06/22/20 0100  ampicillin (OMNIPEN) 2 g in sodium chloride 0.9 % 100 mL IVPB  Status:  Discontinued        2 g 300 mL/hr over 20 Minutes Intravenous Every 12 hours 06/21/20 1553 06/21/20 1950   06/22/20 0000  cefTRIAXone (ROCEPHIN) 2 g in sodium chloride 0.9 % 100 mL IVPB  Status:  Discontinued        2 g 200 mL/hr over 30 Minutes Intravenous Every 12 hours 06/21/20 1553  06/26/20 1422   06/21/20 2100  ampicillin (OMNIPEN) 2 g in sodium chloride 0.9 % 100 mL IVPB  Status:  Discontinued        2 g 300 mL/hr over 20 Minutes Intravenous Every 8 hours 06/21/20 1950 06/25/20 0937   06/21/20 1715  vancomycin (VANCOREADY) IVPB 500 mg/100 mL        500 mg 100 mL/hr over 60 Minutes Intravenous  Once 06/21/20 1615 06/21/20 2143   06/21/20 1715  acyclovir (ZOVIRAX) 385 mg in dextrose 5 % 100 mL IVPB  Status:  Discontinued        385 mg 107.7 mL/hr over 60 Minutes Intravenous Every 24 hours 06/21/20 1615 06/21/20 1950   06/21/20 1613  vancomycin variable dose per unstable renal function (pharmacist dosing)  Status:  Discontinued         Does not apply See admin instructions 06/21/20 1615 06/24/20 0742       Subjective: Without  complaints  Objective: Vitals:   07/01/20 2344 07/02/20 0500 07/02/20 0820 07/02/20 1536  BP: (!) 149/91  (!) 151/95 (!) 180/94  Pulse: 72  80 72  Resp: '18  18 18  '$ Temp:   97.9 F (36.6 C) 98.3 F (36.8 C)  TempSrc: Oral  Oral   SpO2: 98%  98% 97%  Weight:  106.1 kg      Intake/Output Summary (Last 24 hours) at 07/02/2020 1621 Last data filed at 07/02/2020 1530 Gross per 24 hour  Intake 234 ml  Output 2106 ml  Net -1872 ml   Filed Weights   06/29/20 0500 06/30/20 0500 07/02/20 0500  Weight: 103.4 kg 103.5 kg 106.1 kg    Examination:  General exam: Appears calm and comfortable  Respiratory system: Clear to auscultation. Respiratory effort normal. Cardiovascular system: S1 & S2 heard, Regular Gastrointestinal system: Abdomen is nondistended, soft and nontender. No organomegaly or masses felt. Normal bowel sounds heard. Central nervous system: Alert and oriented. No focal neurological deficits. Extremities: Symmetric 5 x 5 power. Skin: No rashes, lesions  Psychiatry: Judgement and insight appear normal. Mood & affect appropriate.   Data Reviewed: I have personally reviewed following labs and imaging studies  CBC: Recent Labs  Lab 06/25/20 1920 06/26/20 0335 06/27/20 0500  WBC 10.5 10.9* 10.3  NEUTROABS  --  8.0* 7.4  HGB 9.1* 9.2* 8.5*  HCT 26.6* 27.0* 24.5*  MCV 91.7 94.7 92.5  PLT 312 286 [id number]   Basic Metabolic Panel: Recent Labs  Lab 06/28/20 0602 06/29/20 0416 06/30/20 0248 07/01/20 0434 07/02/20 0500  NA 141 140 142 140 140  K 3.6 3.5 3.6 3.6 3.6  CL 107 105 105 106 108  CO2 '25 27 26 22 22  '$ GLUCOSE 217* 112* 87 121* 118*  BUN 40* 23* 31* 32* 33*  CREATININE 5.87* 4.22* 5.20* 5.53* 5.69*  CALCIUM 8.3* 8.1* 8.4* 8.3* 8.1*  MG 2.3 2.1 2.2 1.9 2.0  PHOS 4.4 3.5 4.8* 5.0* 5.5*   GFR: Estimated Creatinine Clearance: 22.4 mL/min (A) (by C-G formula based on SCr of 5.69 mg/dL (H)). Liver Function Tests: Recent Labs  Lab 06/28/20 0602 06/29/20 0416  06/30/20 0248 07/01/20 0434 07/02/20 0500  ALBUMIN 1.6* 1.7* 1.8* 1.9* 1.9*   No results for input(s): LIPASE, AMYLASE in the last 168 hours. No results for input(s): AMMONIA in the last 168 hours. Coagulation Profile: No results for input(s): INR, PROTIME in the last 168 hours. Cardiac Enzymes: No results for input(s): CKTOTAL, CKMB, CKMBINDEX, TROPONINI in the last 168 hours. BNP (  last 3 results) No results for input(s): PROBNP in the last 8760 hours. HbA1C: No results for input(s): HGBA1C in the last 72 hours. CBG: Recent Labs  Lab 07/01/20 2020 07/01/20 2343 07/02/20 0427 07/02/20 1150 07/02/20 1618  GLUCAP 132* 97 102* 162* 129*   Lipid Profile: Recent Labs    07/01/20 0434  TRIG 88   Thyroid Function Tests: No results for input(s): TSH, T4TOTAL, FREET4, T3FREE, THYROIDAB in the last 72 hours. Anemia Panel: No results for input(s): VITAMINB12, FOLATE, FERRITIN, TIBC, IRON, RETICCTPCT in the last 72 hours. Sepsis Labs: No results for input(s): PROCALCITON, LATICACIDVEN in the last 168 hours.  No results found for this or any previous visit (from the past 240 hour(s)).   Radiology Studies: No results found.  Scheduled Meds: . aspirin  81 mg Oral Daily  . carvedilol  25 mg Oral BID WC  . chlorhexidine  15 mL Mouth Rinse BID  . Chlorhexidine Gluconate Cloth  6 each Topical Q0600  . clopidogrel  75 mg Oral Daily  . darbepoetin (ARANESP) injection - DIALYSIS  60 mcg Intravenous Q Sat-HD  . famotidine  20 mg Oral QHS  . insulin aspart  0-9 Units Subcutaneous TID WC  . levETIRAcetam  500 mg Oral BID  . mouth rinse  15 mL Mouth Rinse q12n4p   Continuous Infusions: . sodium chloride       LOS: 11 days   Marylu Lund, MD [facility] Pager On Amion  If 7PM-7AM, please contact night-coverage 07/02/2020, 4:21 PM

## 2020-07-02 NOTE — Progress Notes (Signed)
Physical Therapy Treatment Patient Details Name: Adrian Evans MRN: [id number] DOB: 1985-10-29 Today's Date: 07/02/2020    History of Present Illness Pt is 35 yo M admitted to [facility] 1/26 w n/v malaise, weakness. Hypotensive in ED and volume resuscitated. Worse AMS without clear etiology, transferring to [facility] 1/29 for LP and cEEG. MRI revealed multiple scattered punctate acute ishcemic infarcts involving both cerebral hemisphers, R greater than L and a small remote L PCA territory infarct, with additional small remote L thalamic and R cerebellar infarcts. Pt extubated 2/1.  Per MD note: " Acute encephalopathy, multifactorial- Also suspected viral encephalitis"    PT Comments    Pt continues to make significant progress with mobility as he was able to progress from utilizing a RW to no AD this date without LOB and only min guard-supervision. While pt is in the category for being at risk for falls per his DGI score of 18 this date he appears to assess the challenges and appropriately and safely respond by adjusting steps or speed with obstacles to prevent LOB. He tends to display increased difficulty and slow processing with changes in gait speed, turn and stop, and stepping over obstacles. Pt and pt's wife extensively educated on being aware of obstacles, like rugs, at home to prevent tripping, safe negotiation and guarding with stairs, and HEP with step-ups and back down on bottom step. Will continue to follow acutely. Current recommendations remain appropriate.   Follow Up Recommendations  Outpatient PT     Equipment Recommendations  None recommended by PT    Recommendations for Other Services       Precautions / Restrictions Precautions Precautions: Fall Restrictions Weight Bearing Restrictions: No    Mobility  Bed Mobility               General bed mobility comments: Pt walking with wife in hall when arrived.  Transfers                 General transfer comment: Pt  walking in hall with wife upon arrival.  Ambulation/Gait Ambulation/Gait assistance: Min guard;Supervision Gait Distance (Feet): 300 Feet Assistive device: Rolling walker (2 wheeled);None Gait Pattern/deviations: Step-through pattern;Narrow base of support Gait velocity: decreased Gait velocity interpretation: 1.31 - 2.62 ft/sec, indicative of limited community ambulator General Gait Details: Pt progressed from utilizing RW initially ambulating with wife in hall to no AD without LOB. Pt approaches obstacles/challenges carefully and adjusts self to ensure safety and balance, see DGI. Pt ambulates at slower pace than usual, tends to look down at feet, and needs extra time to process and respond to cues with gait. Min guard-supervision for safety.   Stairs Stairs: Yes Stairs assistance: Min guard Stair Management: Step to pattern;Forwards;One rail Right;One rail Left Number of Stairs: 30 General stair comments: Ascending with R rail and descending with L rail to simulate home. Pt initially attempted to ascend with reciprocal pattern but displayed weakness and instability and was educated on ascending with strong leg (R) leading and descending with weak leg (L) leading, success and no LOB with min guard for safety. First 10 stairs with PT guarding, next 10 stairs with wife guarding providing education in prep for d/c, final 10 reps step up and back 5x each leg for strengthening   Wheelchair Mobility    Modified Rankin (Stroke Patients Only) Modified Rankin (Stroke Patients Only) Pre-Morbid Rankin Score: No symptoms Modified Rankin: Moderate disability     Balance Overall balance assessment: Needs assistance  Standing balance support: No upper extremity supported Standing balance-Leahy Scale: Good Standing balance comment: Ambulates without overt LOB, responding safely to challenges.                 Standardized Balance Assessment Standardized Balance Assessment :  Dynamic Gait Index   Dynamic Gait Index Level Surface: Mild Impairment Change in Gait Speed: Mild Impairment Gait with Horizontal Head Turns: Normal Gait with Vertical Head Turns: Normal Gait and Pivot Turn: Mild Impairment Step Over Obstacle: Mild Impairment Step Around Obstacles: Normal Steps: Moderate Impairment Total Score: 18      Cognition Arousal/Alertness: Awake/alert Behavior During Therapy: WFL for tasks assessed/performed Overall Cognitive Status: Impaired/Different from baseline Area of Impairment: Attention;Following commands;Awareness;Problem solving                   Current Attention Level: Focused Memory: Decreased short-term memory Following Commands: Follows multi-step commands consistently;Follows multi-step commands with increased time   Awareness: Anticipatory Problem Solving: Slow processing General Comments: Pt anticipates difficult challenges and responds approrpiately/adjusts self to ensure safety with mobility. Pt with slow processing and response at times.      Exercises Other Exercises Other Exercises: Step-ups and back on bottom stair with R hand rail, 5x each leg for educating pt on possible HEP exercise at home    General Comments        Pertinent Vitals/Pain Pain Assessment: No/denies pain Pain Intervention(s): Monitored during session    Home Living                      Prior Function            PT Goals (current goals can now be found in the care plan section) Acute Rehab PT Goals Patient Stated Goal: to go home PT Goal Formulation: With patient/family Time For Goal Achievement: 07/09/20 Potential to Achieve Goals: Good Progress towards PT goals: Progressing toward goals    Frequency    Min 4X/week      PT Plan Current plan remains appropriate;Equipment recommendations need to be updated    Co-evaluation              AM-PAC PT "6 Clicks" Mobility   Outcome Measure  Help needed turning from  your back to your side while in a flat bed without using bedrails?: None Help needed moving from lying on your back to sitting on the side of a flat bed without using bedrails?: None Help needed moving to and from a bed to a chair (including a wheelchair)?: A Little Help needed standing up from a chair using your arms (e.g., wheelchair or bedside chair)?: A Little Help needed to walk in hospital room?: A Little Help needed climbing 3-5 steps with a railing? : A Little 6 Click Score: 20    End of Session Equipment Utilized During Treatment: Gait belt Activity Tolerance: Patient tolerated treatment well Patient left: with family/visitor present;in chair;with call bell/phone within reach;with chair alarm set Nurse Communication: Mobility status PT Visit Diagnosis: Unsteadiness on feet (R26.81);Muscle weakness (generalized) (M62.81);Difficulty in walking, not elsewhere classified (R26.2);Other symptoms and signs involving the nervous system (R29.898);Other abnormalities of gait and mobility (R26.89)     Time: [id number] PT Time Calculation (min) (ACUTE ONLY): 21 min  Charges:  $Gait Training: 8-22 mins                     Moishe Spice, PT, DPT Acute Rehabilitation Services  Pager: [phone] Office: [phone]  Maretta Bees Pettis 07/02/2020, 6:12 PM

## 2020-07-03 LAB — RENAL FUNCTION PANEL
Albumin: 2 g/dL — ABNORMAL LOW (ref 3.5–5.0)
Anion gap: 10 (ref 5–15)
BUN: 37 mg/dL — ABNORMAL HIGH (ref 6–20)
CO2: 23 mmol/L (ref 22–32)
Calcium: 8 mg/dL — ABNORMAL LOW (ref 8.9–10.3)
Chloride: 105 mmol/L (ref 98–111)
Creatinine, Ser: 5.92 mg/dL — ABNORMAL HIGH (ref 0.61–1.24)
GFR, Estimated: 12 mL/min — ABNORMAL LOW (ref >60)
Glucose, Bld: 117 mg/dL — ABNORMAL HIGH (ref 70–99)
Phosphorus: 5.8 mg/dL — ABNORMAL HIGH (ref 2.5–4.6)
Potassium: 3.7 mmol/L (ref 3.5–5.1)
Sodium: 138 mmol/L (ref 135–145)

## 2020-07-03 LAB — GLUCOSE, CAPILLARY
Glucose-Capillary: 116 mg/dL — ABNORMAL HIGH (ref 70–99)
Glucose-Capillary: 123 mg/dL — ABNORMAL HIGH (ref 70–99)
Glucose-Capillary: 97 mg/dL (ref 70–99)
Glucose-Capillary: 148 mg/dL — ABNORMAL HIGH (ref 70–99)

## 2020-07-03 LAB — MAGNESIUM: Magnesium: 1.9 mg/dL (ref 1.7–2.4)

## 2020-07-03 MED ORDER — SODIUM CHLORIDE 0.9 % IV SOLN: INTRAVENOUS | Status: AC

## 2020-07-03 MED ORDER — COVID-19 MRNA VAC-TRIS(PFIZER) 30 MCG/0.3ML IM SUSP
0.3000 mL | Freq: Once | INTRAMUSCULAR | Status: AC
  Administered 2020-07-03: 0.3 mL via INTRAMUSCULAR
  Filled 2020-07-03: qty 0.3

## 2020-07-03 NOTE — Progress Notes (Signed)
Patient ID: LORRIN PINSKY, male   DOB: 1985-08-20, 35 y.o.   MRN: [id number] [facility] Progress Note   Assessment/ Plan:   1. Acute kidney Injury on chronic kidney disease stage IV: Baseline creatinine suspected to be in the mid 3 range (underlying CKD likely from DM) and presented to the hospital with acute kidney injury in the setting of cardiogenic versus hypovolemic shock and ATN.  Initially underwent CRRT and was transitioned to intermittent hemodialysis after becoming more clinically/hemodynamically stable.  Patient received dialysis on 2/3. Urine output over the last 24 hours of 2400 mL. Creatinine slightly increased to 5.92, BUN 37, sodium 138, potassium 3.7, calcium 8.0, phosphorus 5.8. No plans for dialysis today. Temporary dialysis catheter to remain in place today and gentle hydration.  Strict I's and O's if creatinine improves tomorrow consider removing temporary dialysis catheter. 2.  Acute metabolic encephalopathy: Suspected to be multifactorial in origin with severe azotemia/uremia but patient also noted to have had multiple scattered punctate cerebellar infarcts on MRI raising some concern.  Resolved at this time with patient back at baseline. 3.  Congestive heart failure with reduced ejection fraction: 4.  Anemia: Secondary to chronic kidney disease and exacerbated by acute/critical illness 5.  Secondary hyperparathyroidism: Calcium and phosphorus level within acceptable limits; continue to monitor with sequential labs.  Subjective:   Patient is doing well this morning.  No acute events overnight.   Objective:   BP 138/85 (BP Location: Left Arm)   Pulse 80   Temp 98.4 F (36.9 C) (Oral)   Resp 17   Wt 105.1 kg   SpO2 96%   BMI 31.42 kg/m   Intake/Output Summary (Last 24 hours) at 07/03/2020 [id number] Last data filed at 07/03/2020 0600 Gross per 24 hour  Intake --  Output 2400 ml  Net -2400 ml   Weight change: -1 kg  Physical Exam: Gen: Lying comfortably  in bed, no acute distress CVS: Regular rate and rhythm, no murmurs appreciated Resp: Clear to auscultation bilaterally with normal work of breathing Abd: Soft, nontender, positive bowel sounds Ext: Trace lower extremity edema  Imaging: No results found.  Labs: BMET Recent Labs  Lab 06/27/20 0500 06/28/20 0602 06/29/20 0416 06/30/20 0248 07/01/20 0434 07/02/20 0500 07/03/20 0500  NA 138 141 140 142 140 140 138  K 3.6 3.6 3.5 3.6 3.6 3.6 3.7  CL 102 107 105 105 106 108 105  CO2 '24 25 27 26 22 22 23  '$ GLUCOSE 230* 217* 112* 87 121* 118* 117*  BUN 33* 40* 23* 31* 32* 33* 37*  CREATININE 4.86* 5.87* 4.22* 5.20* 5.53* 5.69* 5.92*  CALCIUM 8.2* 8.3* 8.1* 8.4* 8.3* 8.1* 8.0*  PHOS 3.9 4.4 3.5 4.8* 5.0* 5.5* 5.8*   CBC Recent Labs  Lab 06/27/20 0500  WBC 10.3  NEUTROABS 7.4  HGB 8.5*  HCT 24.5*  MCV 92.5  PLT 290    Medications:    . aspirin  81 mg Oral Daily  . carvedilol  25 mg Oral BID WC  . chlorhexidine  15 mL Mouth Rinse BID  . Chlorhexidine Gluconate Cloth  6 each Topical Q0600  . clopidogrel  75 mg Oral Daily  . darbepoetin (ARANESP) injection - DIALYSIS  60 mcg Intravenous Q Sat-HD  . famotidine  20 mg Oral QHS  . insulin aspart  0-9 Units Subcutaneous TID WC  . levETIRAcetam  500 mg Oral BID  . mouth rinse  15 mL Mouth Rinse q12n4p      Christy Sartorius  Casie Sturgeon, MD 07/03/2020, 7:42 AM

## 2020-07-03 NOTE — Progress Notes (Signed)
Physical Therapy Treatment Patient Details Name: Adrian Evans MRN: [id number] DOB: [birth year] Today's Date: 07/03/2020    History of Present Illness Pt is 35 yo M admitted to [facility] 1/26 w n/v malaise, weakness. Hypotensive in ED and volume resuscitated. Worse AMS without clear etiology, transferring to [facility] 1/29 for LP and cEEG. MRI revealed multiple scattered punctate acute ishcemic infarcts involving both cerebral hemisphers, R greater than L and a small remote L PCA territory infarct, with additional small remote L thalamic and R cerebellar infarcts. Pt extubated 2/1.  Per MD note: " Acute encephalopathy, multifactorial- Also suspected viral encephalitis"    PT Comments    Focused session on further preparing pt and his wife on safe mobility and guarding for anticipated d/c home. Both demonstrated good learning and carryover from prior session and only needed 1 cue each for feet sequencing or guarding stance to initiate stairs. Reviewed possible HEP exercises of step-ups, sit to stands from chair, and quads stretch supine in bed with one leg off EOB. Pt displays balance deficits when feet placed in narrow stance, needing 1 finger support for balance in tandem stance. Will continue to follow acutely. Current recommendations remain appropriate.    Follow Up Recommendations  Outpatient PT     Equipment Recommendations  None recommended by PT    Recommendations for Other Services       Precautions / Restrictions Precautions Precautions: Fall Restrictions Weight Bearing Restrictions: No    Mobility  Bed Mobility               General bed mobility comments: Pt sitting up upon arrival.    Transfers Overall transfer level: Needs assistance Equipment used: None Transfers: Sit to/from Stand Sit to Stand: Supervision         General transfer comment: Pt able to stand safely without overt LOB, supervision for safety.  Ambulation/Gait Ambulation/Gait assistance:  Supervision Gait Distance (Feet): 200 Feet Assistive device: Rolling walker (2 wheeled);None Gait Pattern/deviations: Step-through pattern;Narrow base of support Gait velocity: decreased Gait velocity interpretation: 1.31 - 2.62 ft/sec, indicative of limited community ambulator General Gait Details: Pt ambulates at slow pace without AD or UE support and no LOB. Supervision for safety. Wife educated on nearby gaurding for safety at home.   Stairs Stairs: Yes Stairs assistance: Min guard Stair Management: Step to pattern;Forwards;One rail Right;One rail Left Number of Stairs: 25 General stair comments: Ascending with R rail and descending with L rail to simulate home. Pt ascending with strong leg leading and descending with weak leg leading, success and no LOB with min guard for safety. Good carryover from pt and wife on proper and safe stair negotiation and guarding. Wife guarding pt throughout, needing only 1 cue initially for foot placement starting to ascend stairs. Final 5 reps step up and back 2-3x each leg for strengthening and ensuring HEP undertstanding and memory.   Wheelchair Mobility    Modified Rankin (Stroke Patients Only) Modified Rankin (Stroke Patients Only) Pre-Morbid Rankin Score: No symptoms Modified Rankin: Moderate disability     Balance Overall balance assessment: Needs assistance         Standing balance support: No upper extremity supported;Single extremity supported Standing balance-Leahy Scale: Good Standing balance comment: Ambulates without overt LOB, responding safely to challenges. Difficulty with tandem stance needing 1 finger support for balance, but able to hold Rhomberg with mild trunk sway and no UE support.     Tandem Stance - Right Leg: 2 (seconds before LOB needing UE to  regain balance, 10 seconds with 1 finger support on sink)   Rhomberg - Eyes Opened: 10 (seconds no LOB, mild trunk sway)                  Cognition  Arousal/Alertness: Awake/alert Behavior During Therapy: WFL for tasks assessed/performed Overall Cognitive Status: Impaired/Different from baseline Area of Impairment: Awareness;Problem solving;Following commands                   Current Attention Level: Focused   Following Commands: Follows multi-step commands consistently;Follows multi-step commands with increased time   Awareness: Anticipatory Problem Solving: Slow processing General Comments: Pt anticipates challenges and responds appropriately/adjusts self to ensure safety with mobility. Pt with slow processing and response at times.      Exercises Other Exercises Other Exercises: Step-ups and back on bottom stair with R hand rail, 2-3x each leg for educating pt on possible HEP exercise at home (good memory from pt on exercise when cued) Other Exercises: reviewed HEP option of modified squats, sit to stand from chair without UE support Other Exercises: Educated pt on supine quad stretch with use of bed sheet and 1 leg off EOB    General Comments        Pertinent Vitals/Pain Pain Assessment: Faces Faces Pain Scale: Hurts a little bit Pain Location: R quad Pain Descriptors / Indicators: Burning;Grimacing Pain Intervention(s): Limited activity within patient's tolerance;Monitored during session;Repositioned    Home Living                      Prior Function            PT Goals (current goals can now be found in the care plan section) Acute Rehab PT Goals Patient Stated Goal: to go home PT Goal Formulation: With patient/family Time For Goal Achievement: 07/09/20 Potential to Achieve Goals: Good Progress towards PT goals: Progressing toward goals    Frequency    Min 4X/week      PT Plan Current plan remains appropriate    Co-evaluation              AM-PAC PT "6 Clicks" Mobility   Outcome Measure  Help needed turning from your back to your side while in a flat bed without using  bedrails?: None Help needed moving from lying on your back to sitting on the side of a flat bed without using bedrails?: None Help needed moving to and from a bed to a chair (including a wheelchair)?: A Little Help needed standing up from a chair using your arms (e.g., wheelchair or bedside chair)?: A Little Help needed to walk in hospital room?: A Little Help needed climbing 3-5 steps with a railing? : A Little 6 Click Score: 20    End of Session Equipment Utilized During Treatment: Gait belt Activity Tolerance: Patient tolerated treatment well Patient left: with family/visitor present;in chair;with call bell/phone within reach Nurse Communication: Mobility status PT Visit Diagnosis: Unsteadiness on feet (R26.81);Muscle weakness (generalized) (M62.81);Difficulty in walking, not elsewhere classified (R26.2);Other symptoms and signs involving the nervous system (R29.898);Other abnormalities of gait and mobility (R26.89)     Time: 0943-1000 PT Time Calculation (min) (ACUTE ONLY): 17 min  Charges:  $Gait Training: 8-22 mins                     Moishe Spice, PT, DPT Acute Rehabilitation Services  Pager: [phone] Office: [place] 07/03/2020, 6:46 PM

## 2020-07-03 NOTE — Plan of Care (Signed)
  Problem: Education: Goal: Expressions of having a comfortable level of knowledge regarding the disease process will increase Outcome: Progressing   Problem: Coping: Goal: Ability to adjust to condition or change in health will improve Outcome: Progressing Goal: Ability to identify appropriate support needs will improve Outcome: Progressing   Problem: Health Behavior/Discharge Planning: Goal: Compliance with prescribed medication regimen will improve Outcome: Progressing   Problem: Medication: Goal: Risk for medication side effects will decrease Outcome: Progressing   Problem: Clinical Measurements: Goal: Complications related to the disease process, condition or treatment will be avoided or minimized Outcome: Progressing Goal: Diagnostic test results will improve Outcome: Progressing   Problem: Safety: Goal: Verbalization of understanding the information provided will improve Outcome: Progressing   Problem: Self-Concept: Goal: Level of anxiety will decrease Outcome: Progressing Goal: Ability to verbalize feelings about condition will improve Outcome: Progressing   Problem: Education: Goal: Knowledge of General Education information will improve Description: Including pain rating scale, medication(s)/side effects and non-pharmacologic comfort measures Outcome: Progressing   Problem: Health Behavior/Discharge Planning: Goal: Ability to manage health-related needs will improve Outcome: Progressing   Problem: Clinical Measurements: Goal: Ability to maintain clinical measurements within normal limits will improve Outcome: Progressing Goal: Will remain free from infection Outcome: Progressing Goal: Diagnostic test results will improve Outcome: Progressing Goal: Respiratory complications will improve Outcome: Progressing Goal: Cardiovascular complication will be avoided Outcome: Progressing   Problem: Activity: Goal: Risk for activity intolerance will  decrease Outcome: Progressing   Problem: Nutrition: Goal: Adequate nutrition will be maintained Outcome: Progressing   Problem: Coping: Goal: Level of anxiety will decrease Outcome: Progressing   Problem: Elimination: Goal: Will not experience complications related to bowel motility Outcome: Progressing Goal: Will not experience complications related to urinary retention Outcome: Progressing   Problem: Pain Managment: Goal: General experience of comfort will improve Outcome: Progressing   Problem: Safety: Goal: Ability to remain free from injury will improve Outcome: Progressing   Problem: Skin Integrity: Goal: Risk for impaired skin integrity will decrease Outcome: Progressing   Problem: Activity: Goal: Ability to tolerate increased activity will improve Outcome: Progressing   Problem: Respiratory: Goal: Ability to maintain a clear airway and adequate ventilation will improve Outcome: Progressing   Problem: Role Relationship: Goal: Method of communication will improve Outcome: Progressing   Problem: Safety: Goal: Non-violent Restraint(s) Outcome: Progressing   Problem: Safety: Goal: Non-violent Restraint(s) Outcome: Progressing

## 2020-07-03 NOTE — Progress Notes (Signed)
PROGRESS NOTE    Adrian Evans  [id number] DOB: 10-09-1985 DOA: 06/21/2020 PCP: Lenard Simmer, MD    Brief Narrative:  35 year old gentleman who has history of type 1 diabetes on insulin, diabetic retinopathy, hypertension, stage IIIb chronic kidney disease who presented to [facility] on 1/26 with nausea, vomiting, malaise and weakness.  Patient went to work in the morning, he did not feel well so taken to the emergency room.  Wife reports that he had poor appetite and felt body ache for the last few days. Significant events: 1/26 admitted at [facility] in shock felt to be hypovolemic  1/27 cards consult, nephrology consult. Surgery and vascular consults for HD access 1/28 hypotensive and transferred to ICU. AMS, sz-like episodes During tele neuro eval. Rec HD 1/29 Keppra, meningitis coverage, [facility]G and LP 1/30 LP performed by IR, MRI showed multiple cerebral infarcts1/26 admitted, in shock felt to be hypovolemic   1/28 CT H no acute intracranial abnormality -- ill defined low density focus in L thalamus 1/28 CTA h/n>no LVO  2/5 significant improvement in mental status.  Able to swallow. 2/7 mental status normalized.  Physical strength improving and normalizing.  1/29 MRI Brain 1. Multiple scattered punctate acute to early subacute ischemic white matter infarcts involving the periventricular and deep white matter of both cerebral hemispheres, right greater than left. No associated hemorrhage or mass effect. 2. 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  2/8 ,wanting to go home with outpatient rehab and waiting for renal recovery and finalizing HD plans.  Assessment & Plan:   Active Problems:   Acute encephalopathy   Acute cerebral infarction ([place])   Diabetes mellitus type I ([place])   Stage 3 chronic kidney disease ([place])   Essential hypertension   Chronic systolic congestive heart  failure ([facility])   Dysphagia, post-stroke  Acute encephalopathy, multifactorial.  Also suspected viral encephalitis. Suspected secondary to uremia, multiple stroke. 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 Negative for bacterial meningitis. Negative for autoimmune meningitis.  QuantiFERON and HIV negative.  Hepatitis panel negative.  Covid negative.  Streptococcus pneumoniae antigen negative.  Autoimmune work-up negative. Varicella-zoster PCR negative. No evidence of bacterial infection, antibiotics discontinued. 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  Apparently, labs were not collected on 1/30.   He completed 10 days of acyclovir therapy.  HSV PSR done on previous sample, reviewed results, noted to be neg on 2/5  Seizure disorder: Reported seizure like episodes more likely uremic symptoms.  Seen by neurology.  Currently remains on Keppra. 3 days video EEG did not demonstrate any seizures. Discussed with neurology, they recommended Keppra 500 mg 2 times a day until outpatient neurology follow-up.   Neurology to f/u on d/c  Shock: Suspect hypovolemic shock.  Transiently on vasopressors.  Now off vasopressors.  Normalized.   Acute stroke: Acute multiple territory stroke thought to be secondary to hypotension and hypoperfusion. Encephalopathic, no focal deficit.  Aggressive rehab. Refer to CIR. Aspirin and plavix for 3 weeks and aspirin alone as suggested by neurology. LDL 44, no statin needed  A1c 7.4, on insulin.  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  Last dialysis 2/5. Has right IJ temporary catheter. Foley catheter discontinued. Nephrology trying gentle hydration and expecting renal recovery back to baseline. Per Nephrology, no plan for HD  but to cont HD cath for  now Cr overall stable, but is slightly higher than yesterday.  -repeat bmet in AM  Acute hypoxemic respiratory failure secondary to altered mental status: Extubated to nasal cannula oxygen on 2/1. Remains on room air.  Acute on chronic combined heart failure: Previous EF 45%.  TEE with ejection fraction of 20 to 25%. Suspect acute cardiomyopathy stress. on coreg. Will not tolerate ACE or ARB due to renal function abnormalities. Limited echocardiogram 2/6 with ejection fraction of 25 to 30%. Avoiding diuretics with anticipating improvement in renal functions. Fluid balance achieved by dialysis.  Clinically euvolemic. Wants to follow-up with cardiology at [facility].  Dr. Sloan Leiter has sent referral  Type 1 diabetes: Currently only on sliding scale insulin.  Stable.  DVT prophylaxis: SCD's Code Status: Full Family Communication: Pt in room, family at bedside  Status is: Inpatient  Remains inpatient appropriate because:Unsafe d/c plan and Inpatient level of care appropriate due to severity of illness  Dispo:  Patient From: Home  Planned Disposition: Home  Expected discharge date: 07/04/2020  Medically stable for discharge: No      Consultants:   PCCM  Neurology  Surgery  Procedures:     Antimicrobials: Anti-infectives (From admission, onward)   Start     Dose/Rate Route Frequency Ordered Stop   06/26/20 1000  vancomycin (VANCOCIN) IVPB 1000 mg/200 mL premix        1,000 mg 200 mL/hr over 60 Minutes Intravenous  Once 06/26/20 0914 06/26/20 1116   06/26/20 0916  vancomycin (VANCOCIN) 1-5 GM/200ML-% IVPB       Note to Pharmacy: Kalman Shan   : cabinet override      06/26/20 0916 06/26/20 2129   06/26/20 0600  acyclovir (ZOVIRAX) 390 mg in dextrose 5 % 100 mL IVPB        5 mg/kg  77.6 kg (Ideal) 107.8 mL/hr over 60 Minutes Intravenous Every 24 hours 06/25/20 0937 06/30/20 1037   06/25/20 1800  ampicillin (OMNIPEN) 2 g in sodium chloride 0.9 % 100 mL IVPB  Status:   Discontinued        2 g 300 mL/hr over 20 Minutes Intravenous Every 12 hours 06/25/20 0937 06/26/20 1422   06/25/20 0935  vancomycin variable dose per unstable renal function (pharmacist dosing)  Status:  Discontinued         Does not apply See admin instructions 06/25/20 0937 06/26/20 1422   06/23/20 1700  acyclovir (ZOVIRAX) 775 mg in dextrose 5 % 150 mL IVPB  Status:  Discontinued        10 mg/kg  77.6 kg (Ideal) 165.5 mL/hr over 60 Minutes Intravenous Every 12 hours 06/23/20 0846 06/25/20 0937   06/22/20 2200  vancomycin (VANCOCIN) IVPB 1000 mg/200 mL premix  Status:  Discontinued        1,000 mg 200 mL/hr over 60 Minutes Intravenous Every 24 hours 06/21/20 1950 06/25/20 0937   06/22/20 0500  acyclovir (ZOVIRAX) 385 mg in dextrose 5 % 100 mL IVPB  Status:  Discontinued        385 mg 107.7 mL/hr over 60 Minutes Intravenous Every 12 hours 06/21/20 1950 06/23/20 0846   06/22/20 0100  ampicillin (OMNIPEN) 2 g in sodium chloride 0.9 % 100 mL IVPB  Status:  Discontinued        2 g 300 mL/hr over 20 Minutes Intravenous Every 12 hours 06/21/20 1553 06/21/20 1950   06/22/20 0000  cefTRIAXone (ROCEPHIN) 2 g in sodium chloride 0.9 % 100 mL  IVPB  Status:  Discontinued        2 g 200 mL/hr over 30 Minutes Intravenous Every 12 hours 06/21/20 1553 06/26/20 1422   06/21/20 2100  ampicillin (OMNIPEN) 2 g in sodium chloride 0.9 % 100 mL IVPB  Status:  Discontinued        2 g 300 mL/hr over 20 Minutes Intravenous Every 8 hours 06/21/20 1950 06/25/20 0937   06/21/20 1715  vancomycin (VANCOREADY) IVPB 500 mg/100 mL        500 mg 100 mL/hr over 60 Minutes Intravenous  Once 06/21/20 1615 06/21/20 2143   06/21/20 1715  acyclovir (ZOVIRAX) 385 mg in dextrose 5 % 100 mL IVPB  Status:  Discontinued        385 mg 107.7 mL/hr over 60 Minutes Intravenous Every 24 hours 06/21/20 1615 06/21/20 1950   06/21/20 1613  vancomycin variable dose per unstable renal function (pharmacist dosing)  Status:  Discontinued          Does not apply See admin instructions 06/21/20 1615 06/24/20 0742      Subjective: No complaints at this time  Objective: Vitals:   07/03/20 0500 07/03/20 0741 07/03/20 1133 07/03/20 1610  BP:  (!) 160/96 (!) 151/95 (!) 146/88  Pulse:  78 70 72  Resp:  '20 18 16  '$ Temp:  98.1 F (36.7 C) 97.7 F (36.5 C) 97.7 F (36.5 C)  TempSrc:  Oral Oral Oral  SpO2:  99% 100% 99%  Weight: 105.1 kg       Intake/Output Summary (Last 24 hours) at 07/03/2020 1729 Last data filed at 07/03/2020 1700 Gross per 24 hour  Intake --  Output 2600 ml  Net -2600 ml   Filed Weights   06/30/20 0500 07/02/20 0500 07/03/20 0500  Weight: 103.5 kg 106.1 kg 105.1 kg    Examination: General exam: Awake, sitting in chair, in nad Respiratory system: Normal respiratory effort, no wheezing Cardiovascular system: regular rate, s1, s2 Gastrointestinal system: Soft, nondistended, positive BS Central nervous system: CN2-12 grossly intact, strength intact Extremities: Perfused, no clubbing Skin: Normal skin turgor, no notable skin lesions seen Psychiatry: Mood normal // no visual hallucinations   Data Reviewed: I have personally reviewed following labs and imaging studies  CBC: Recent Labs  Lab 06/27/20 0500  WBC 10.3  NEUTROABS 7.4  HGB 8.5*  HCT 24.5*  MCV 92.5  PLT [id number]   Basic Metabolic Panel: Recent Labs  Lab 06/29/20 0416 06/30/20 0248 07/01/20 0434 07/02/20 0500 07/03/20 0500  NA 140 142 140 140 138  K 3.5 3.6 3.6 3.6 3.7  CL 105 105 106 108 105  CO2 '27 26 22 22 23  '$ GLUCOSE 112* 87 121* 118* 117*  BUN 23* 31* 32* 33* 37*  CREATININE 4.22* 5.20* 5.53* 5.69* 5.92*  CALCIUM 8.1* 8.4* 8.3* 8.1* 8.0*  MG 2.1 2.2 1.9 2.0 1.9  PHOS 3.5 4.8* 5.0* 5.5* 5.8*   GFR: Estimated Creatinine Clearance: 21.4 mL/min (A) (by C-G formula based on SCr of 5.92 mg/dL (H)). Liver Function Tests: Recent Labs  Lab 06/29/20 0416 06/30/20 0248 07/01/20 0434 07/02/20 0500 07/03/20 0500  ALBUMIN  1.7* 1.8* 1.9* 1.9* 2.0*   No results for input(s): LIPASE, AMYLASE in the last 168 hours. No results for input(s): AMMONIA in the last 168 hours. Coagulation Profile: No results for input(s): INR, PROTIME in the last 168 hours. Cardiac Enzymes: No results for input(s): CKTOTAL, CKMB, CKMBINDEX, TROPONINI in the last 168 hours. BNP (last 3 results) No results  for input(s): PROBNP in the last 8760 hours. HbA1C: No results for input(s): HGBA1C in the last 72 hours. CBG: Recent Labs  Lab 07/02/20 1618 07/02/20 2133 07/03/20 0609 07/03/20 1131 07/03/20 1602  GLUCAP 129* 130* 116* 123* 97   Lipid Profile: Recent Labs    07/01/20 0434  TRIG 88   Thyroid Function Tests: No results for input(s): TSH, T4TOTAL, FREET4, T3FREE, THYROIDAB in the last 72 hours. Anemia Panel: No results for input(s): VITAMINB12, FOLATE, FERRITIN, TIBC, IRON, RETICCTPCT in the last 72 hours. Sepsis Labs: No results for input(s): PROCALCITON, LATICACIDVEN in the last 168 hours.  No results found for this or any previous visit (from the past 240 hour(s)).   Radiology Studies: No results found.  Scheduled Meds: . aspirin  81 mg Oral Daily  . carvedilol  25 mg Oral BID WC  . chlorhexidine  15 mL Mouth Rinse BID  . Chlorhexidine Gluconate Cloth  6 each Topical Q0600  . clopidogrel  75 mg Oral Daily  . darbepoetin (ARANESP) injection - DIALYSIS  60 mcg Intravenous Q Sat-HD  . famotidine  20 mg Oral QHS  . insulin aspart  0-9 Units Subcutaneous TID WC  . levETIRAcetam  500 mg Oral BID  . mouth rinse  15 mL Mouth Rinse q12n4p   Continuous Infusions: . sodium chloride    . sodium chloride 100 mL/hr at 07/03/20 1156     LOS: 12 days   Marylu Lund, MD [facility] Pager On Amion  If 7PM-7AM, please contact night-coverage 07/03/2020, 5:29 PM

## 2020-07-04 ENCOUNTER — Other Ambulatory Visit: Payer: Self-pay | Admitting: Internal Medicine

## 2020-07-04 ENCOUNTER — Ambulatory Visit: Payer: BC Managed Care – PPO | Admitting: Family

## 2020-07-04 LAB — RENAL FUNCTION PANEL
Albumin: 1.9 g/dL — ABNORMAL LOW (ref 3.5–5.0)
Anion gap: 9 (ref 5–15)
BUN: 36 mg/dL — ABNORMAL HIGH (ref 6–20)
CO2: 21 mmol/L — ABNORMAL LOW (ref 22–32)
Calcium: 8.2 mg/dL — ABNORMAL LOW (ref 8.9–10.3)
Chloride: 110 mmol/L (ref 98–111)
Creatinine, Ser: 5.96 mg/dL — ABNORMAL HIGH (ref 0.61–1.24)
GFR, Estimated: 12 mL/min — ABNORMAL LOW (ref >60)
Glucose, Bld: 110 mg/dL — ABNORMAL HIGH (ref 70–99)
Phosphorus: 5.6 mg/dL — ABNORMAL HIGH (ref 2.5–4.6)
Potassium: 3.6 mmol/L (ref 3.5–5.1)
Sodium: 140 mmol/L (ref 135–145)

## 2020-07-04 LAB — GLUCOSE, CAPILLARY
Glucose-Capillary: 104 mg/dL — ABNORMAL HIGH (ref 70–99)
Glucose-Capillary: 123 mg/dL — ABNORMAL HIGH (ref 70–99)

## 2020-07-04 LAB — TRIGLYCERIDES: Triglycerides: 106 mg/dL (ref <150)

## 2020-07-04 LAB — MAGNESIUM: Magnesium: 1.8 mg/dL (ref 1.7–2.4)

## 2020-07-04 MED ORDER — ASPIRIN 81 MG PO CHEW: 81.0000 mg | CHEWABLE_TABLET | Freq: Every day | ORAL | 0 refills | Status: AC

## 2020-07-04 MED ORDER — INSULIN PEN NEEDLE 31G X 5 MM MISC: 1.0000 | Freq: Three times a day (TID) | 0 refills | Status: AC | PRN

## 2020-07-04 MED ORDER — ADULT MULTIVITAMIN W/MINERALS CH
1.0000 | ORAL_TABLET | Freq: Every day | ORAL | Status: DC
  Administered 2020-07-04: 1 via ORAL
  Filled 2020-07-04: qty 1

## 2020-07-04 MED ORDER — INSULIN ASPART 100 UNIT/ML FLEXPEN: PEN_INJECTOR | SUBCUTANEOUS | 0 refills | Status: DC

## 2020-07-04 MED ORDER — FUROSEMIDE 40 MG PO TABS
40.0000 mg | ORAL_TABLET | Freq: Every day | ORAL | 0 refills | Status: DC | PRN
Start: 2020-07-04 — End: 2021-07-16

## 2020-07-04 MED ORDER — CARVEDILOL 25 MG PO TABS: 25.0000 mg | ORAL_TABLET | Freq: Two times a day (BID) | ORAL | 0 refills | Status: DC

## 2020-07-04 MED ORDER — LEVETIRACETAM 500 MG PO TABS
500.0000 mg | ORAL_TABLET | Freq: Two times a day (BID) | ORAL | 0 refills | Status: DC
Start: 2020-07-04 — End: 2021-07-07

## 2020-07-04 MED ORDER — CLOPIDOGREL BISULFATE 75 MG PO TABS: 75.0000 mg | ORAL_TABLET | Freq: Every day | ORAL | 0 refills | Status: AC

## 2020-07-04 MED ORDER — ENSURE MAX PROTEIN PO LIQD
11.0000 [oz_av] | Freq: Three times a day (TID) | ORAL | Status: DC
  Administered 2020-07-04: 11 [oz_av] via ORAL
  Filled 2020-07-04 (×3): qty 330

## 2020-07-04 MED ORDER — PROSOURCE PLUS PO LIQD
30.0000 mL | Freq: Three times a day (TID) | ORAL | Status: DC
  Administered 2020-07-04: 30 mL via ORAL
  Filled 2020-07-04: qty 30

## 2020-07-04 MED ORDER — INSULIN ASPART 100 UNIT/ML ~~LOC~~ SOLN: 0.0000 [IU] | Freq: Three times a day (TID) | SUBCUTANEOUS | 0 refills | Status: DC

## 2020-07-04 MED FILL — CLOPIDOGREL 75 MG TABLET: 75 | 21 days supply | Qty: 21 | Fill #0

## 2020-07-04 MED FILL — NOVOLOG FLEXPEN SYRINGE: 100 | 30 days supply | Qty: 15 | Fill #0

## 2020-07-04 MED FILL — BD PEN NDL NANO 32GX5/32: 32G X 4 MM | 30 days supply | Qty: 100 | Fill #0

## 2020-07-04 MED FILL — ASPIRIN LOW DOSE 81 MG CHEW: 81 | 30 days supply | Qty: 30 | Fill #0

## 2020-07-04 MED FILL — levETIRAcetam 500 MG TABS: 500 | 30 days supply | Qty: 60 | Fill #0

## 2020-07-04 MED FILL — CARVEDILOL 25 MG TABLET: 25 | 30 days supply | Qty: 60 | Fill #0

## 2020-07-04 NOTE — Plan of Care (Signed)
  Problem: Medication: Goal: Risk for medication side effects will decrease Outcome: Progressing   Problem: Safety: Goal: Verbalization of understanding the information provided will improve Outcome: Progressing   Problem: Education: Goal: Knowledge of General Education information will improve Description: Including pain rating scale, medication(s)/side effects and non-pharmacologic comfort measures Outcome: Progressing   Problem: Clinical Measurements: Goal: Ability to maintain clinical measurements within normal limits will improve Outcome: Progressing Goal: Will remain free from infection Outcome: Progressing Goal: Diagnostic test results will improve Outcome: Progressing Goal: Respiratory complications will improve Outcome: Progressing Goal: Cardiovascular complication will be avoided Outcome: Progressing

## 2020-07-04 NOTE — Discharge Instructions (Signed)
Take both aspirin and plavix daily for 21 days then aspirin alone afterwards

## 2020-07-04 NOTE — Progress Notes (Signed)
Nutrition Follow-up  DOCUMENTATION CODES:   Not applicable  INTERVENTION:   Ensure Max po TID, each supplement provides 150 kcal and 30 grams of protein  17m Prosource Plus po TID, each supplement provides 100 kcals and 15 grams of protein  MVI with minerals daily  NUTRITION DIAGNOSIS:   Inadequate oral intake related to inability to eat as evidenced by NPO status. -- ongoing  GOAL:   Patient will meet greater than or equal to 90% of their needs -- progressing  MONITOR:   TF tolerance,Vent status,Labs  REASON FOR ASSESSMENT:   Consult,Ventilator Enteral/tube feeding initiation and management  ASSESSMENT:   Pt with PMH of type 1 DM, CHF, CKD stage 3b, diabetic retinopathy, and HTN who was admitted with N/V, AKI, and, acute encephalopathy.  [facility] d/t hypotension, AMS, sz-like episodes [facility]; intubated 1/30 s/p LP; MRI revealed multiple cerebral infarcts 2/1 extubated; CRRT stopped 2/2 Cortrak placed (tip gastric) 2/3 iHD 2/4 diet advanced to dysphagia 2 with thin liquids 2/5 iHD 2/6 Cortrak removed 2/7 diet advanced to regular  Per MD, pt's mental status now seems stable and near baseline. Per Nephrology, there is currently no plan for HD, but would like to continue HD cath at this time. Pt's Cr is overall stable.   Pt has had good PO intake since diet advancement, especially since diet was further advanced to regular. Meal completions charted as 40-100% x 6 recorded meals (77.5% average meal intake). Pt not longer receiving TF given Cortrak was removed and pt able to tolerate po intake; however, pt not eating enough to meet kcal/protein needs. Will order oral nutrition supplements to help pt meet needs.   UOP: [id number] hours  Pt's weight has ranged from 94.5-109kg throughout admission. Pt currently at highest weight. Weight likely due to edema and changes likely due to fluid shifts. Note pt previously  reported a usual body weight of 100 kg.   Labs: Cr 5.96 (H, slightly higher than yesterday), PO4 5.6 (H), CBGs 104-148 Medications: ss novolog TID w/ meals, aranesp, pepcid  Diet Order:   Diet Order            Diet regular Room service appropriate? Yes with Assist; Fluid consistency: Thin  Diet effective now                 EDUCATION NEEDS:   No education needs have been identified at this time  Skin:  Skin Assessment: Skin Integrity Issues: Skin Integrity Issues:: Incisions Incisions: (MASD) - right buttocks; medial  Last BM:  2/11  Height:   Ht Readings from Last 1 Encounters:  06/18/20 6' (1.829 m)    Weight:   Wt Readings from Last 1 Encounters:  07/04/20 109 kg    Ideal Body Weight:  80.9 kg  BMI:  Body mass index is 32.59 kg/m.  Estimated Nutritional Needs:   Kcal:  2400-2600  Protein:  125-140 g  Fluid:  1 L plus UOP    AmLarkin InaMS, RD, LDN RD pager number and weekend/on-call pager number located in [employer]

## 2020-07-04 NOTE — Discharge Summary (Signed)
Physician Discharge Summary  Adrian Evans [id number] DOB: April 26, 1986 DOA: 06/21/2020  PCP: Lenard Simmer, MD  Admit date: 06/21/2020 Discharge date: 07/04/2020  Admitted From: Home Disposition:  Home  Recommendations for Outpatient Follow-up:  1. Follow up with PCP in 1-2 weeks 2. Follow up with Neurology as scheduled 3. Follow up with Nephrology as scheduled 4. Per Nephrology, repeat BMET in one week with PCP  Discharge Condition:Improved CODE STATUS:Full Diet recommendation: Renal, diabetic   Brief/Interim Summary: 35 year old gentleman who has history of type 1 diabetes on insulin, diabetic retinopathy, hypertension, stage IIIb chronic kidney disease who presented to [facility] on 1/26 with nausea, vomiting, malaise and weakness. Patient went to work in the morning, he did not feel well so taken to the emergency room. Wife reports that he had poor appetite and felt body ache for the last few days. Significant events: 1/26 admitted at [facility] in shock felt to be hypovolemic  1/27 cards consult, nephrology consult. Surgery and vascular consults for HD access 1/28 hypotensive and transferred to ICU. AMS, sz-like episodes During tele neuro eval. Rec HD 1/29 Keppra, meningitis coverage, [facility] for cEEG and LP 1/30 LP performed by IR, MRI showed multiple cerebral infarcts1/26 admitted, in shock felt to be hypovolemic   1/28 CT H no acute intracranial abnormality -- ill defined low density focus in L thalamus 1/28 CTA h/n>no LVO  2/5 significant improvement in mental status. Able to swallow. 2/7 mental status normalized. Physical strength improving and normalizing.  1/29 MRI Brain 1. Multiple scattered punctate acute to early subacute ischemic white matter infarcts involving the periventricular and deep white matter of both cerebral hemispheres, right greater than left. No associated hemorrhage or mass effect. 2. Small remote left PCA territory infarct, with  additional small remote left thalamic and right cerebellar infarcts.  2/8,wanting to go home with outpatient rehab and waiting for renal recovery and finalizing HD plans.  Discharge Diagnoses:  Active Problems:   Acute encephalopathy   Acute cerebral infarction ([place])   Diabetes mellitus type I ([place])   Stage 3 chronic kidney disease ([place])   Essential hypertension   Chronic systolic congestive heart failure (HCC)   Dysphagia, post-stroke  Acute encephalopathy, multifactorial. Also suspected viral encephalitis. Suspected secondary to uremia, multiple stroke. 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 Negative for bacterial meningitis. Negative for autoimmune meningitis. QuantiFERON and HIV negative. Hepatitis panel negative. Covid negative. Streptococcus pneumoniae antigen negative. Autoimmune work-up negative. Varicella-zoster PCR negative. No evidence of bacterial infection, antibiotics discontinued. 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 Apparently, labs were not collected on 1/30.  He completed 10 days of acyclovir therapy.  HSV PSR done on previous sample, reviewed results, noted to be neg on 2/5  Seizure disorder: Reported seizure like episodes more likely uremic symptoms. Seen by neurology. Currently remains on Keppra. 3 days video EEG did not demonstrate any seizures. Discussed with neurology, they recommended Keppra 500 mg 2 times a day until outpatient neurology follow-up. Neurology to f/u on d/c  Shock: Suspect hypovolemic shock. Transiently on vasopressors. Now off vasopressors. Normalized.   Acute stroke: Acute multiple territory stroke thought to be secondary to hypotension and hypoperfusion. Encephalopathic, no focal deficit.  Aggressive rehab. Refer to CIR. Aspirin and plavix for 3 weeks and aspirin alone as suggested by neurology. LDL 44, no statin needed  A1c 7.4, on  insulin.  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 Last dialysis 2/5. Has right IJ temporary catheter. Foley catheter discontinued. Nephrology trying gentle hydrationand expecting renal recovery back to baseline. Per Nephrology, no plan for HD. HD cath removed Per Nephrology, OK to d/c today. Recommend repeat bmet in 1 week by PCP Also Nephrology recommend lasix PRN LE swelling on d/c Nephrology to follow closely as outpatient  Acute hypoxemic respiratory failure secondary to altered mental status: Extubated to nasal cannula oxygen on 2/1. Remains on room air.  Acute on chronic combined heart failure: Previous EF 45%. TEE with ejection fraction of 20 to 25%. Suspect acute cardiomyopathy stress. on coreg. Will not tolerate ACE or ARB due to renal function abnormalities. Limited echocardiogram 2/6 with ejection fraction of 25 to 30%. Avoiding diuretics with anticipating improvement in renal functions. Fluid balance achieved by dialysis. Clinically euvolemic. Wants to follow-up with cardiology at [facility]. Dr. Sloan Leiter has sent referral  Type 1 diabetes: Currently only on sliding scale insulin, will continue on d/c  Discharge Instructions  Discharge Instructions    Ambulatory referral to Cardiology   Complete by: As directed    Acute systolic CHF due to acute illness   Ambulatory referral to Neurology   Complete by: As directed    An appointment is requested in approximately: 4 weeks   Ambulatory referral to Occupational Therapy   Complete by: As directed    Ambulatory referral to Physical Therapy   Complete by: As directed    Ambulatory referral to Speech Therapy   Complete by: As directed      Allergies as of 07/04/2020   No Known Allergies     Medication List    STOP taking these medications   levETIRAcetam 500 MG/100ML  Soln Commonly known as: KEPRRA   midodrine 10 MG tablet Commonly known as: PROAMATINE   norepinephrine 16-5 MG/250ML-% Soln Commonly known as: LEVOPHED     TAKE these medications   acetaminophen 325 MG tablet Commonly known as: TYLENOL Take 2 tablets (650 mg total) by mouth every 6 (six) hours as needed for mild pain (or Fever >/= 101).   aspirin 81 MG chewable tablet Chew 1 tablet (81 mg total) by mouth daily. Start taking on: July 05, 2020   bisacodyl 5 MG EC tablet Commonly known as: DULCOLAX Take 1 tablet (5 mg total) by mouth daily as needed for moderate constipation.   carvedilol 25 MG tablet Commonly known as: COREG Take 1 tablet (25 mg total) by mouth 2 (two) times daily with a meal.   Chlorhexidine Gluconate Cloth 2 % Pads Apply 6 each topically daily.   clopidogrel 75 MG tablet Commonly known as: PLAVIX Take 1 tablet (75 mg total) by mouth daily for 21 days. Start taking on: July 05, 2020   insulin aspart 100 UNIT/ML injection Commonly known as: novoLOG Inject 0-9 Units into the skin 3 (three) times daily with meals. CBG 70 - 120: 0 units CBG 121 - 150: 1 unit CBG 151 - 200: 2 units CBG 201 - 250: 3 units CBG 251 - 300: 5 units CBG 301 - 350: 7 units CBG 351 - 400: 9 units What changed:   additional instructions  Another medication with the same name was removed. Continue taking this medication, and follow the directions you see here.   levETIRAcetam 500 MG tablet Commonly known as: KEPPRA Take 1 tablet (500 mg total) by mouth 2 (two) times daily.   sodium chloride 0.9 % infusion Inject 250 mLs  into the vein continuous.            Durable Medical Equipment  (From admission, onward)         Start     Ordered   06/30/20 1528  For home use only DME 3 n 1  Once        06/30/20 1527   06/30/20 1527  For home use only DME Walker rolling  Once       Question Answer Comment  Walker: With 5 Inch Wheels   Patient needs a walker to treat  with the following condition Stroke ([place])      06/30/20 1527   06/30/20 1526  For home use only DME lightweight manual wheelchair with seat cushion  Once       Comments: Patient suffers from stroke hich impairs their ability to perform daily activities like ambulating in the home.  A walker will not resolve  issue with performing activities of daily living. A wheelchair will allow patient to safely perform daily activities. Patient is not able to propel themselves in the home using a standard weight wheelchair due to weakness. Patient can self propel in the lightweight wheelchair. Length of need 6 months. Accessories: elevating leg rests (ELRs), wheel locks, extensions and anti-tippers.   06/30/20 [place] Follow up.   Specialty: Rehabilitation Why: The outpatient therapy will contact you for the first appointment Contact information: [address] [place]             No Known Allergies  Consultations:  Nephrology  PCCM  Neurology  Surgery  Procedures/Studies: CT ANGIO HEAD W OR WO CONTRAST  Result Date: 06/21/2020 CLINICAL DATA:  Encephalopathy.  Acute renal failure.  Diabetes. EXAM: CT ANGIOGRAPHY HEAD AND NECK TECHNIQUE: Multidetector CT imaging of the head and neck was performed using the standard protocol during bolus administration of intravenous contrast. Multiplanar CT image reconstructions and MIPs were obtained to evaluate the vascular anatomy. Carotid stenosis measurements (when applicable) are obtained utilizing NASCET criteria, using the distal internal carotid diameter as the denominator. CONTRAST:  55m OMNIPAQUE IOHEXOL 350 MG/ML SOLN COMPARISON:  CT head 06/21/2020, 06/20/2020 FINDINGS: CTA NECK FINDINGS Aortic arch: Normal aortic arch. Proximal great vessels widely patent and normal. Right carotid system: Normal  right carotid without stenosis Left carotid system: Normal left carotid without stenosis Vertebral arteries: Normal vertebral arteries bilaterally. No stenosis or dissection. Skeleton: Negative Other neck: Negative for mass or edema in the neck. Upper chest: Lung apices clear bilaterally. Review of the MIP images confirms the above findings CTA HEAD FINDINGS Anterior circulation: Internal carotid artery patent bilaterally without stenosis. Anterior middle cerebral arteries widely patent without stenosis or large vessel occlusion. Posterior circulation: Both vertebral arteries widely patent to the basilar. PICA patent. Basilar widely patent without stenosis or thrombus. Superior cerebellar and posterior cerebral arteries patent bilaterally. Focal stenosis right P1 segment proximal to the right posterior communicating artery which is patent. Venous sinuses: Normal venous enhancement. Anatomic variants: None Review of the MIP images confirms the above findings IMPRESSION: 1. Carotid and vertebral arteries are normal in the neck without stenosis 2. Negative for intracranial large vessel occlusion 3. Stenosis right P1 segment which may be due to patent posterior communicating artery on the right. 4. Review of CT from 1/28 and 1/29 demonstrates hypodensity in the left occipital lobe and left thalamus suspicious  for recent infarction. 5. These results were called by telephone at the time of interpretation on 06/21/2020 at 2:08 pm to provider [facility] , who verbally acknowledged these results. Electronically Signed   By: Franchot Gallo M.D.   On: 06/21/2020 14:08   DG Chest 1 View  Result Date: 06/21/2020 CLINICAL DATA:  35 year old male with a history of aspiration pneumonia EXAM: CHEST  1 VIEW COMPARISON:  06/20/2020 FINDINGS: Cardiomediastinal silhouette unchanged in size and contour. Unchanged right IJ central venous catheter/HD catheter. Low lung volumes with patchy opacities in in the lower lungs. No pleural  effusion or pneumothorax. IMPRESSION: Low lung volumes with patchy opacities, potentially atelectasis or alternatively developing infection. Unchanged right IJ central catheter. Electronically Signed   By: Corrie Mckusick D.O.   On: 06/21/2020 14:58   CT HEAD WO CONTRAST  Result Date: 06/21/2020 CLINICAL DATA:  Delirium. EXAM: CT HEAD WITHOUT CONTRAST TECHNIQUE: Contiguous axial images were obtained from the base of the skull through the vertex without intravenous contrast. COMPARISON:  Head CTs dated 06/20/2020 in 05/10/2018 FINDINGS: Brain: Ventricles are normal in size and configuration. 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 Additional ill-defined low-density focus within the inferior-medial LEFT occipital lobe, of uncertain etiology or chronicity, possibly representing a site of previous ischemic or traumatic insult, subacute ischemia not excluded. No mass, hemorrhage, edema or other evidence of acute parenchymal abnormality. No extra-axial hemorrhage. Vascular: No hyperdense vessel or unexpected calcification. Skull: Normal. Negative for fracture or focal lesion. Sinuses/Orbits: Fluid/mucosal thickening within the RIGHT maxillary sinus, incompletely imaged, of uncertain chronicity. Other: None. IMPRESSION: 1. Ill-defined low-density focus within the inferior-medial LEFT occipital lobe, of uncertain etiology or chronicity, possibly representing a site of previous ischemic or traumatic insult, but subacute ischemia not excluded. Consider brain MRI for further characterization. 2. 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 3. No intracranial hemorrhage.  No mass effect or midline shift. 4. RIGHT maxillary sinus disease, incompletely imaged, of uncertain chronicity. These results will be called to the ordering  clinician or representative by the Radiologist Assistant, and communication documented in the PACS or [employer]. Electronically Signed   By: Franki Cabot M.D.   On: 06/21/2020 11:29   CT HEAD WO CONTRAST  Result Date: 06/20/2020 CLINICAL DATA:  Mental status change EXAM: CT HEAD WITHOUT CONTRAST TECHNIQUE: Contiguous axial images were obtained from the base of the skull through the vertex without intravenous contrast. COMPARISON:  CT head 05/10/2018 FINDINGS: Brain: No evidence of acute infarction, hemorrhage, hydrocephalus, extra-axial collection or mass lesion/mass effect. Vascular: Negative for hyperdense vessel Skull: Negative Sinuses/Orbits: Mucosal edema right maxillary sinus. Remaining paranasal sinuses clear. Negative orbit Other: None IMPRESSION: Normal CT head Mucosal edema right maxillary sinus. Electronically Signed   By: Franchot Gallo M.D.   On: 06/20/2020 21:10   CT ANGIO NECK W OR WO CONTRAST  Result Date: 06/21/2020 CLINICAL DATA:  Encephalopathy.  Acute renal failure.  Diabetes. EXAM: CT ANGIOGRAPHY HEAD AND NECK TECHNIQUE: Multidetector CT imaging of the head and neck was performed using the standard protocol during bolus administration of intravenous contrast. Multiplanar CT image reconstructions and MIPs were obtained to evaluate the vascular anatomy. Carotid stenosis measurements (when applicable) are obtained utilizing NASCET criteria, using the distal internal carotid diameter as the denominator. CONTRAST:  29m OMNIPAQUE IOHEXOL 350 MG/ML  SOLN COMPARISON:  CT head 06/21/2020, 06/20/2020 FINDINGS: CTA NECK FINDINGS Aortic arch: Normal aortic arch. Proximal great vessels widely patent and normal. Right carotid system: Normal right carotid without stenosis Left carotid system: Normal left carotid without stenosis Vertebral arteries: Normal vertebral arteries bilaterally. No stenosis or dissection. Skeleton: Negative Other neck: Negative for mass or edema in the neck. Upper  chest: Lung apices clear bilaterally. Review of the MIP images confirms the above findings CTA HEAD FINDINGS Anterior circulation: Internal carotid artery patent bilaterally without stenosis. Anterior middle cerebral arteries widely patent without stenosis or large vessel occlusion. Posterior circulation: Both vertebral arteries widely patent to the basilar. PICA patent. Basilar widely patent without stenosis or thrombus. Superior cerebellar and posterior cerebral arteries patent bilaterally. Focal stenosis right P1 segment proximal to the right posterior communicating artery which is patent. Venous sinuses: Normal venous enhancement. Anatomic variants: None Review of the MIP images confirms the above findings IMPRESSION: 1. Carotid and vertebral arteries are normal in the neck without stenosis 2. Negative for intracranial large vessel occlusion 3. Stenosis right P1 segment which may be due to patent posterior communicating artery on the right. 4. Review of CT from 1/28 and 1/29 demonstrates hypodensity in the left occipital lobe and left thalamus suspicious for recent infarction. 5. These results were called by telephone at the time of interpretation on 06/21/2020 at 2:08 pm to provider [facility] , who verbally acknowledged these results. Electronically Signed   By: Franchot Gallo M.D.   On: 06/21/2020 14:08   MR BRAIN WO CONTRAST  Result Date: 06/21/2020 CLINICAL DATA:  Initial evaluation for acute delirium. EXAM: MRI HEAD WITHOUT CONTRAST TECHNIQUE: Multiplanar, multiecho pulse sequences of the brain and surrounding structures were obtained without intravenous contrast. COMPARISON:  Prior CTs from earlier the same day. FINDINGS: Brain: Cerebral volume within normal limits for age. Focus of encephalomalacia and gliosis seen within the left occipital lobe, corresponding with abnormality on prior CT. Finding is most characteristic of a small remote left PCA territory infarct. Associated serpiginous T1  hyperintensity within this region consistent with laminar necrosis. Associated susceptibility artifact noted as well, consistent with chronic blood products (series 12, image 22). No acute hemorrhage seen within this region on prior CT. Additional small remote left thalamic lacunar infarct noted (series 15, image 14). Additional tiny remote right cerebellar infarct (series 15, image 7). Multiple scattered punctate foci of restricted diffusion seen involving the periventricular and deep white matter of both cerebral hemispheres, right greater than left (series 5, images 91, 90, 86, 85, 84, 82, 78, 75). Findings consistent with punctate acute to early subacute ischemic white matter infarcts. No associated hemorrhage or mass effect. Involvement of the corpus callosum noted. No other diffusion abnormality to suggest acute or subacute ischemia. Gray-white matter differentiation otherwise maintained. No other evidence for acute intracranial hemorrhage. Few additional punctate chronic micro hemorrhages noted, nonspecific. No mass lesion, midline shift or mass effect. No hydrocephalus or extra-axial fluid collection. Pituitary gland and suprasellar region normal. Midline structures intact. Vascular: Major intracranial vascular flow voids are maintained. Skull and upper cervical spine: Craniocervical junction within normal limits. Bone marrow signal intensity normal. No scalp soft tissue abnormality. Sinuses/Orbits: Globes and orbital soft tissues within normal limits. Scattered mucosal thickening noted throughout the paranasal sinuses. Fluid seen layering within the nasopharynx. Patient is intubated. Mastoid air cells remain largely clear. Inner ear structures grossly normal. Other: None. IMPRESSION: 1. Multiple scattered punctate acute to early subacute ischemic white matter infarcts involving the periventricular and  deep white matter of both cerebral hemispheres, right greater than left. No associated hemorrhage or mass  effect. 2. 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 Electronically Signed   By: Jeannine Boga M.D.   On: 06/21/2020 19:35   US Renal  Result Date: 06/18/2020 CLINICAL DATA:  Acute renal failure EXAM: RENAL / URINARY TRACT ULTRASOUND COMPLETE COMPARISON:  05/23/2018 FINDINGS: Right Kidney: Renal measurements: 12.7 x 5.5 x 6.1 cm = volume: 224 mL. Echogenic cortex. No hydronephrosis or mass Left Kidney: Renal measurements: 13.3 x 6.6 x 5.9 cm = volume: 270 mL. Echogenic cortex. No hydronephrosis or mass. Bladder: Appears normal for degree of bladder distention. Other: Echogenic mass within the right hepatic lobe measuring 2.1 x 2 x 1.9 cm, not seen on prior ultrasound IMPRESSION: 1. Echogenic kidneys bilaterally consistent with medical renal disease. No hydronephrosis. 2. 2.1 cm solid echogenic mass in the right hepatic lobe not clearly identified on prior ultrasound, question hemangioma. When the patient is clinically stable and able to follow directions and hold their breath (preferably as an outpatient) further evaluation with dedicated abdominal MRI should be considered. Electronically Signed   By: Donavan Foil M.D.   On: 06/18/2020 18:06   DG CHEST PORT 1 VIEW  Result Date: 06/21/2020 CLINICAL DATA:  Status post intubation EXAM: PORTABLE CHEST 1 VIEW COMPARISON:  June 21, 2020 FINDINGS: The endotracheal tube terminates above the carina. The dialysis catheter is well position with the tip terminating in the distal SVC. The heart size remains enlarged. The main pulmonary artery is dilated as before. There is no pneumothorax. There is no definite pleural effusion, however evaluation is limited by patient positioning. IMPRESSION: 1. Lines and tubes as above.  No pneumothorax. 2. Cardiomegaly. 3. Dilated main pulmonary artery suggestive of underlying pulmonary artery hypertension. Electronically Signed   By: Constance Holster  M.D.   On: 06/21/2020 20:39   DG Chest Port 1 View  Result Date: 06/20/2020 CLINICAL DATA:  Central line placement. EXAM: PORTABLE CHEST 1 VIEW COMPARISON:  06/18/2020 FINDINGS: New right internal jugular central venous line has its tip in the lower superior vena cava. No pneumothorax. Cardiac silhouette mildly enlarged.  No mediastinal or hilar masses. Pulmonary vascular prominence is similar to the prior study. No pulmonary edema. Lungs otherwise clear. IMPRESSION: 1. Right internal jugular central venous catheter has its tip in the lower superior vena cava. No pneumothorax. 2. No acute cardiopulmonary disease. Electronically Signed   By: Lajean Manes M.D.   On: 06/20/2020 16:10   DG Chest Port 1 View  Result Date: 06/18/2020 CLINICAL DATA:  Questionable sepsis. EXAM: PORTABLE CHEST 1 VIEW COMPARISON:  05/22/2018. FINDINGS: Severe cardiomegaly again noted. Pulmonary venous congestion. Mild bilateral interstitial prominence. CHF cannot be excluded. Pneumonitis cannot be excluded. No pleural effusion or pneumothorax. Mild thoracic spine scoliosis. No acute bony abnormality. IMPRESSION: Severe cardiomegaly again noted. Pulmonary venous congestion. Mild bilateral interstitial prominence. Mild CHF may be present. Pneumonitis cannot be excluded. Electronically Signed   By: Marcello Moores  Register   On: 06/18/2020 14:17   DG Abd Portable 1V  Result Date: 06/22/2020 CLINICAL DATA:  Check gastric catheter placement EXAM: PORTABLE ABDOMEN - 1 VIEW COMPARISON:  None. FINDINGS: Gastric catheter is noted in the distal stomach. No obstructive changes are seen. Endotracheal tube and right jugular central line are noted and stable. IMPRESSION: Gastric catheter within the distal stomach. Electronically Signed   By: Inez Catalina M.D.   On:  06/22/2020 00:07   DG Swallowing Func-Speech Pathology  Result Date: 06/27/2020 Objective Swallowing Evaluation: Type of Study: MBS-Modified Barium Swallow Study  Patient Details Name:  Adrian Evans MRN: [id number] Date of Birth: 20-Jun-1985 Today's Date: 06/27/2020 Time: SLP Start Time (ACUTE ONLY): 1336 -SLP Stop Time (ACUTE ONLY): 1356 SLP Time Calculation (min) (ACUTE ONLY): 20 min Past Medical History: Past Medical History: Diagnosis Date . Diabetes mellitus without complication [facility])  Past Surgical History: Past Surgical History: Procedure Laterality Date . EYE SURGERY   . RIGHT/LEFT HEART CATH AND CORONARY ANGIOGRAPHY N/A 05/25/2018  Procedure: RIGHT/LEFT HEART CATH AND CORONARY ANGIOGRAPHY;  Surgeon: Corey Skains, MD;  Location: [facility];  Service: Cardiovascular;  Laterality: N/A; . TONSILLECTOMY   HPI: JEBADIAH BOYDEN is a 35 y.o. male admitted with generalized weakness, malaise, nausea vomiting, nasal congestion/sneezing/cough. Found to be in shock, concern for sepis, acute renal failure and acute stroke. Intubated 1/29-2/1 after becoming difficult to arouse. MRI multiple scattered punctate acute to early subacute ischemic white matter infarcts involving the periventricular and deep white matter of both cerebral hemispheres, right greater than left. 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 PMH: type 1 diabetes, EF 20 to 25%, CKD stage IIIb, hypertension.  Chest x-ray showed severe cardiomegaly, pulmonary venous congestion, mild bilateral interstitial prominence (mild CHF versus pneumonitis cannot be excluded).  No data recorded Assessment / Plan / Recommendation CHL IP CLINICAL IMPRESSIONS 06/27/2020 Clinical Impression Pt exhibited oral dysphagia without laryngeal penetration or aspiration observed. 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 There were mild delays and holding of thin liquids. Mastication was mildly prolonged. Lingual residue spilled to pyriform sinuses with nectar. Once bolus reached oropharynx the strength and initiation of swallow mechanism was intact and effective  to protect trachea. No residue post swallow. If he takes sequential sips thin or mixes textures he could have episodes of airway intrusion. Recommend Dys 2 texture, thin, straws allowed, meds via tube (crush when tube removed). Recommend keeping Cortrak to supplement intake as is predicted he may need until oral intake is adequate. SLP Visit Diagnosis Dysphagia, oral phase (R13.11) Attention and concentration deficit following -- Frontal lobe and executive function deficit following -- Impact on safety and function Mild aspiration risk   CHL IP TREATMENT RECOMMENDATION 06/27/2020 Treatment Recommendations Therapy as outlined in treatment plan below   Prognosis 06/27/2020 Prognosis for Safe Diet Advancement Good Barriers to Reach Goals (No Data) Barriers/Prognosis Comment -- [facility]L IP DIET RECOMMENDATION 06/27/2020 SLP Diet Recommendations Dysphagia 2 (Fine chop) solids;Thin liquid Liquid Administration via Straw;Cup Medication Administration Via alternative means Compensations Minimize environmental distractions;Slow rate;Small sips/bites;Lingual sweep for clearance of pocketing Postural Changes Seated upright at 90 degrees   CHL IP OTHER RECOMMENDATIONS 06/27/2020 Recommended Consults -- Oral Care Recommendations Oral care BID Other Recommendations --   CHL IP FOLLOW UP RECOMMENDATIONS 06/27/2020 Follow up Recommendations Inpatient Rehab   CHL IP FREQUENCY AND DURATION 06/27/2020 Speech Therapy Frequency (ACUTE ONLY) min 2x/week Treatment Duration 2 weeks      CHL IP ORAL PHASE 06/27/2020 Oral Phase Impaired Oral - Pudding Teaspoon -- Oral - Pudding Cup -- Oral - Honey Teaspoon -- Oral - Honey Cup -- Oral - Nectar Teaspoon -- Oral - Nectar Cup Lingual/palatal residue Oral - Nectar Straw -- Oral - Thin Teaspoon -- Oral - Thin Cup Delayed oral transit Oral - Thin Straw Delayed oral transit Oral - Puree Delayed oral transit Oral -  Mech Soft -- Oral - Regular Weak lingual manipulation Oral - Multi-Consistency -- Oral - Pill -- Oral  Phase - Comment --  CHL IP PHARYNGEAL PHASE 06/27/2020 Pharyngeal Phase WFL Pharyngeal- Pudding Teaspoon -- Pharyngeal -- Pharyngeal- Pudding Cup -- Pharyngeal -- Pharyngeal- Honey Teaspoon -- Pharyngeal -- Pharyngeal- Honey Cup -- Pharyngeal -- Pharyngeal- Nectar Teaspoon -- Pharyngeal -- Pharyngeal- Nectar Cup WFL Pharyngeal -- Pharyngeal- Nectar Straw -- Pharyngeal -- Pharyngeal- Thin Teaspoon -- Pharyngeal -- Pharyngeal- Thin Cup WFL Pharyngeal -- Pharyngeal- Thin Straw WFL Pharyngeal -- Pharyngeal- Puree WFL Pharyngeal -- Pharyngeal- Mechanical Soft -- Pharyngeal -- Pharyngeal- Regular WFL Pharyngeal -- Pharyngeal- Multi-consistency -- Pharyngeal -- Pharyngeal- Pill -- Pharyngeal -- Pharyngeal Comment --  CHL IP CERVICAL ESOPHAGEAL PHASE 06/27/2020 Cervical Esophageal Phase WFL Pudding Teaspoon -- Pudding Cup -- Honey Teaspoon -- Honey Cup -- Nectar Teaspoon -- Nectar Cup -- Nectar Straw -- Thin Teaspoon -- Thin Cup -- Thin Straw -- Puree -- Mechanical Soft -- Regular -- Multi-consistency -- Pill -- Cervical Esophageal Comment -- [name] 06/27/2020, 4:34 PM  Orbie Pyo Litaker M.Ed Actor Pager [phone] Office [phone]             Overnight EEG with video  Result Date: 06/22/2020 Lora Havens, MD     06/23/2020  8:51 AM Patient Name: Adrian Evans MRN: [id number] Epilepsy Attending: Lora Havens Referring Physician/Provider: Dr Lesleigh Noe Duration: 06/21/2020 2120 to 06/22/2020 2120 Patient history: 35yo M with ams. EEG to evaluate for seizure Level of alertness: Awake, asleep AEDs during EEG study: LEV Technical aspects: This EEG study was done with scalp electrodes positioned according to the 10-20 International system of electrode placement. Electrical activity was acquired at a sampling rate of '500Hz'$  and reviewed with a high frequency filter of '70Hz'$  and a low frequency filter of '1Hz'$ . EEG data were recorded continuously and digitally stored. Description:  No posterior dominant rhythm was seen. Sleep was characterized by vertex waves, sleep spindles (12 to 14 Hz), maximal frontocentral region.  EEG showed continuous generalized 3 to 6 Hz theta-delta slowing.  Hyperventilation and photic stimulation were not performed.   ABNORMALITY -Continuous slow, generalized IMPRESSION: This study is suggestive of moderate diffuse encephalopathy, nonspecific. No seizures or epileptiform discharges were seen throughout the recording. Lora Havens   ECHOCARDIOGRAM COMPLETE  Result Date: 06/19/2020    ECHOCARDIOGRAM REPORT   Patient Name:   JOANATHAN MELCHER Date of Exam: 06/19/2020 Medical Rec #:  [id number]        Height:       72.0 in Accession #:    [id number]       Weight:       227.0 lb Date of Birth:  05-07-1986         BSA:          2.248 m Patient Age:    35 years         BP:           80/49 mmHg Patient Gender: M                HR:           64 bpm. Exam Location:  [facility] Procedure: 2D Echo, Color Doppler, Cardiac Doppler and Strain Analysis Indications:     [id number] CHF-Acute Systolic  History:         Patient has prior history of Echocardiogram examinations.  HFpEF, CKD; Risk Factors:Diabetes and Hypertension.  Sonographer:     Charmayne Sheer RDCS (AE) Referring Phys:  [id number] Claiborne Billings A GRIFFITH Diagnosing Phys: Yolonda Kida MD  Sonographer Comments: Global longitudinal strain was attempted. IMPRESSIONS  1. Left ventricular ejection fraction, by estimation, is 40 to 45%. The left ventricle has mildly decreased function. The left ventricle demonstrates global hypokinesis. The left ventricular internal cavity size was moderately dilated. There is moderate  concentric left ventricular hypertrophy. Left ventricular diastolic parameters were normal. There is the interventricular septum is flattened in diastole ('D' shaped left ventricle), consistent with right ventricular volume overload.  2. Right ventricular systolic function is moderately reduced. The right  ventricular size is moderately enlarged.  3. A small pericardial effusion is present. The pericardial effusion is posterior to the left ventricle and circumferential. There is no evidence of cardiac tamponade.  4. The mitral valve is grossly normal. Trivial mitral valve regurgitation.  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  6. The aortic valve is grossly normal. Aortic valve regurgitation is not visualized. FINDINGS  Left Ventricle: Left ventricular ejection fraction, by estimation, is 40 to 45%. The left ventricle has mildly decreased function. The left ventricle demonstrates global hypokinesis. The left ventricular internal cavity size was moderately dilated. There is moderate concentric left ventricular hypertrophy. The interventricular septum is flattened in diastole ('D' shaped left ventricle), consistent with right ventricular volume overload. Left ventricular diastolic parameters were normal. Right Ventricle: The right ventricular size is moderately enlarged. No increase in right ventricular wall thickness. Right ventricular systolic function is moderately reduced. Left Atrium: Left atrial size was normal in size. Right Atrium: Right atrial size was normal in size. Pericardium: A small pericardial effusion is present. The pericardial effusion is posterior to the left ventricle and circumferential. There is no evidence of cardiac tamponade. Mitral Valve: The mitral valve is grossly normal. Trivial mitral valve regurgitation. MV peak gradient, 3.8 mmHg. The mean mitral valve gradient is 2.0 mmHg. Tricuspid Valve: The tricuspid valve is myxomatous. Tricuspid valve regurgitation is mild. Aortic Valve: The aortic valve is grossly normal. Aortic valve regurgitation is not visualized. Aortic valve mean gradient measures 3.0 mmHg. Aortic valve peak gradient measures 4.9 mmHg. Aortic valve area, by VTI measures 2.67 cm. Pulmonic Valve: The pulmonic valve was normal in structure. Pulmonic valve regurgitation is not  visualized. Aorta: The aortic arch was not well visualized. IAS/Shunts: No atrial level shunt detected by color flow Doppler.  LEFT VENTRICLE PLAX 2D LVIDd:         6.10 cm      Diastology LVIDs:         3.70 cm      LV e' medial:    7.40 cm/s LV PW:         1.30 cm      LV E/e' medial:  11.5 LV IVS:        1.00 cm      LV e' lateral:   5.66 cm/s LVOT diam:     2.30 cm      LV E/e' lateral: 15.0 LV SV:         55 LV SV Index:   25 LVOT Area:     4.15 cm  LV Volumes (MOD) LV vol d, MOD A2C: 176.0 ml LV vol d, MOD A4C: 162.0 ml LV vol s, MOD A2C: 94.8 ml LV vol s, MOD A4C: 70.6 ml LV SV MOD A2C:     81.2 ml LV SV MOD A4C:  162.0 ml LV SV MOD BP:      85.5 ml RIGHT VENTRICLE RV Basal diam:  4.10 cm RV Mid diam:    5.40 cm LEFT ATRIUM             Index       RIGHT ATRIUM           Index LA diam:        3.70 cm 1.65 cm/m  RA Area:     22.20 cm LA Vol (A2C):   59.0 ml 26.24 ml/m RA Volume:   64.20 ml  28.55 ml/m LA Vol (A4C):   65.5 ml 29.13 ml/m LA Biplane Vol: 64.8 ml 28.82 ml/m  AORTIC VALVE                   PULMONIC VALVE AV Area (Vmax):    2.54 cm    PV Vmax:       1.11 m/s AV Area (Vmean):   2.46 cm    PV Vmean:      75.300 cm/s AV Area (VTI):     2.67 cm    PV VTI:        0.228 m AV Vmax:           111.00 cm/s PV Peak grad:  4.9 mmHg AV Vmean:          77.900 cm/s PV Mean grad:  3.0 mmHg AV VTI:            0.207 m AV Peak Grad:      4.9 mmHg AV Mean Grad:      3.0 mmHg LVOT Vmax:         67.90 cm/s LVOT Vmean:        46.100 cm/s LVOT VTI:          0.133 m LVOT/AV VTI ratio: 0.64  AORTA Ao Root diam: 3.80 cm MITRAL VALVE MV Area (PHT): 3.24 cm    SHUNTS MV Area VTI:   2.08 cm    Systemic VTI:  0.13 m MV Peak grad:  3.8 mmHg    Systemic Diam: 2.30 cm MV Mean grad:  2.0 mmHg MV Vmax:       0.98 m/s MV Vmean:      61.7 cm/s MV Decel Time: 234 msec MV E velocity: 84.80 cm/s MV A velocity: 45.80 cm/s MV E/A ratio:  1.85 Yolonda Kida MD Electronically signed by Yolonda Kida MD Signature  Date/Time: 06/19/2020/11:26:37 AM    Final    ECHO TEE  Result Date: 06/23/2020    TRANSESOPHOGEAL ECHO REPORT   Patient Name:   Adrian Evans Date of Exam: 06/23/2020 Medical Rec #:  [id number]        Height:       72.0 in Accession #:    [id number]       Weight:       212.7 lb Date of Birth:  1986/01/28         BSA:          2.187 m Patient Age:    35 years         BP:           126/103 mmHg Patient Gender: M                HR:           69 bpm. Exam Location:  Inpatient Procedure: Transesophageal Echo, Intracardiac Opacification Agent and Saline  Contrast Bubble Study Indications:    Stroke  History:        Patient has prior history of Echocardiogram examinations, most                 recent 06/19/2020. Risk Factors:Diabetes and Hypertension. HFrEF.                 CKD.  Sonographer:    Clayton Lefort RDCS (AE) Referring Phys: [id number] Freddi Starr PROCEDURE: After discussion of the risks and benefits of a TEE, an informed consent was obtained from a family member. The patient was intubated. TEE procedure time was 35 minutes. The transesophogeal probe was passed without difficulty through the esophogus of the patient. Imaged were obtained with the patient in a supine position. Sedation performed by performing physician. Image quality was adequate. The patient's vital signs; including heart rate, blood pressure, and oxygen saturation; remained  stable throughout the procedure. The patient developed no complications during the procedure. IMPRESSIONS  1. Negative bubble study.  2. No thrombus seen in the left atrium, left atrial appendage or left ventricle. Because of thickening in the left ventricular apex a Definity echocontrast was used, but only prominent trabeculations and a large papillary muscle were seen.  3. Left ventricular ejection fraction, by estimation, is 20 to 25%. The left ventricle has severely decreased function. The left ventricle has no regional wall motion abnormalities. The left  ventricular internal cavity size was mildly dilated.  4. Right ventricular systolic function is moderately reduced. The right ventricular size is moderately enlarged.  5. Left atrial size was moderately dilated. No left atrial/left atrial appendage thrombus was detected. The LAA emptying velocity was 40 cm/s.  6. Right atrial size was moderately dilated.  7. A small pericardial effusion is present. The pericardial effusion is circumferential. There is no evidence of cardiac tamponade.  8. The mitral valve is normal in structure. Mild mitral valve regurgitation. No evidence of mitral stenosis.  9. The tricuspid valve is myxomatous. Tricuspid valve regurgitation is moderate. 10. The aortic valve is normal in structure. Aortic valve regurgitation is trivial. No aortic stenosis is present. 11. The inferior vena cava is normal in size with greater than 50% respiratory variability, suggesting right atrial pressure of 3 mmHg. 12. Agitated saline contrast bubble study was negative, with no evidence of any interatrial shunt. Conclusion(s)/Recommendation(s): No LA/LAA thrombus identified. Negative bubble study for interatrial shunt. No intracardiac source of embolism detected on this on this transesophageal echocardiogram. FINDINGS  Left Ventricle: Left ventricular ejection fraction, by estimation, is 20 to 25%. The left ventricle has severely decreased function. The left ventricle has no regional wall motion abnormalities. Definity contrast agent was given IV to delineate the left  ventricular endocardial borders. The left ventricular internal cavity size was mildly dilated. There is no left ventricular hypertrophy. Abnormal (paradoxical) septal motion, consistent with left bundle branch block. Right Ventricle: The right ventricular size is moderately enlarged. No increase in right ventricular wall thickness. Right ventricular systolic function is moderately reduced. Left Atrium: Left atrial size was moderately dilated. No  left atrial/left atrial appendage thrombus was detected. The LAA emptying velocity was 40 cm/s. Right Atrium: Right atrial size was moderately dilated. Pericardium: A small pericardial effusion is present. The pericardial effusion is circumferential. There is no evidence of cardiac tamponade. Mitral Valve: The mitral valve is normal in structure. Mild mitral valve regurgitation. No evidence of mitral valve stenosis. Tricuspid Valve: The tricuspid valve is myxomatous. Tricuspid valve regurgitation is moderate .  No evidence of tricuspid stenosis. Aortic Valve: The aortic valve is normal in structure. Aortic valve regurgitation is trivial. No aortic stenosis is present. Pulmonic Valve: The pulmonic valve was normal in structure. Pulmonic valve regurgitation is trivial. No evidence of pulmonic stenosis. Aorta: The aortic root is normal in size and structure. Venous: The inferior vena cava is normal in size with greater than 50% respiratory variability, suggesting right atrial pressure of 3 mmHg. IAS/Shunts: No atrial level shunt detected by color flow Doppler. Agitated saline contrast was given intravenously to evaluate for intracardiac shunting. Agitated saline contrast bubble study was negative, with no evidence of any interatrial shunt. Ena Dawley MD Electronically signed by Ena Dawley MD Signature Date/Time: 06/23/2020/12:07:54 PM    Final    VAS [place] UPPER EXT VEIN MAPPING (PRE-OP AVF)  Result Date: 06/28/2020 UPPER EXTREMITY VEIN MAPPING  Indications: Pre-access. History: Renal failure.  Limitations: Patient with AMS tightening and holding arms to his sides - very              difficult exam Performing Technologist: Rogelia Rohrer  Examination Guidelines: A complete evaluation includes B-mode imaging, spectral Doppler, color Doppler, and power Doppler as needed of all accessible portions of each vessel. Bilateral testing is considered an integral part of a complete examination. Limited examinations for  reoccurring indications may be performed as noted. +-----------------+-------------+----------+--------------+ Right Cephalic   Diameter (cm)Depth (cm)   Findings    +-----------------+-------------+----------+--------------+ Shoulder                                not visualized +-----------------+-------------+----------+--------------+ Prox upper arm                          not visualized +-----------------+-------------+----------+--------------+ Mid upper arm                           not visualized +-----------------+-------------+----------+--------------+ Dist upper arm                          not visualized +-----------------+-------------+----------+--------------+ Antecubital fossa                       not visualized +-----------------+-------------+----------+--------------+ Prox forearm                            not visualized +-----------------+-------------+----------+--------------+ Mid forearm                             not visualized +-----------------+-------------+----------+--------------+ Dist forearm                            not visualized +-----------------+-------------+----------+--------------+ Wrist                                   not visualized +-----------------+-------------+----------+--------------+ +-----------------+-------------+----------+---------+ Right Basilic    Diameter (cm)Depth (cm)Findings  +-----------------+-------------+----------+---------+ Prox upper arm       0.41                         +-----------------+-------------+----------+---------+ Mid upper arm        0.48                         +-----------------+-------------+----------+---------+  Dist upper arm       0.43                         +-----------------+-------------+----------+---------+ Antecubital fossa    0.49               branching +-----------------+-------------+----------+---------+ Prox forearm         0.35                branching +-----------------+-------------+----------+---------+ Mid forearm          0.12               branching +-----------------+-------------+----------+---------+ Distal forearm       0.12                         +-----------------+-------------+----------+---------+ Wrist                0.12                         +-----------------+-------------+----------+---------+ Subcutaneous edema from [facility] fossa to mid/distal forearm. +-----------------+-------------+----------+---------+ Left Cephalic    Diameter (cm)Depth (cm)Findings  +-----------------+-------------+----------+---------+ Shoulder             0.17        0.75             +-----------------+-------------+----------+---------+ Prox upper arm       0.24        0.49             +-----------------+-------------+----------+---------+ Mid upper arm        0.30        0.38             +-----------------+-------------+----------+---------+ Dist upper arm       0.24        0.37             +-----------------+-------------+----------+---------+ Antecubital fossa    0.46        0.48   branching +-----------------+-------------+----------+---------+ Prox forearm         0.30        0.63             +-----------------+-------------+----------+---------+ Mid forearm          0.29        0.62             +-----------------+-------------+----------+---------+ Dist forearm         0.27        0.31             +-----------------+-------------+----------+---------+ Wrist                0.10        0.31             +-----------------+-------------+----------+---------+ +-----------------+-------------+----------+--------------------------+ Left Basilic     Diameter (cm)Depth (cm)         Findings          +-----------------+-------------+----------+--------------------------+ Prox upper arm       0.44                                           +-----------------+-------------+----------+--------------------------+ Mid upper arm        0.38                                          +-----------------+-------------+----------+--------------------------+  Dist upper arm       0.31                       branching          +-----------------+-------------+----------+--------------------------+ Antecubital fossa    0.50                       branching          +-----------------+-------------+----------+--------------------------+ Prox forearm         0.23                       branching          +-----------------+-------------+----------+--------------------------+ Mid forearm          0.17               Age Indeterminate Thrombus +-----------------+-------------+----------+--------------------------+ Distal forearm       0.13                                          +-----------------+-------------+----------+--------------------------+ Wrist                                         not visualized       +-----------------+-------------+----------+--------------------------+ Summary: Right: Subcutaneous fluid around area of IV in AC fossa. Left: Age indeterminate thrombus in mid forearm (basilic vein). *See table(s) above for measurements and observations.  Diagnosing physician: Monica Martinez MD Electronically signed by Monica Martinez MD on 06/28/2020 at 12:32:47 PM.    Final    ECHOCARDIOGRAM LIMITED  Result Date: 06/29/2020    ECHOCARDIOGRAM LIMITED REPORT   Patient Name:   Adrian Evans Date of Exam: 06/29/2020 Medical Rec #:  [id number]        Height:       72.0 in Accession #:    [id number]       Weight:       228.0 lb Date of Birth:  1985/10/20         BSA:          2.252 m Patient Age:    41 years         BP:           119/78 mmHg Patient Gender: M                HR:           81 bpm. Exam Location:  Inpatient Procedure: Limited Echo, Limited Color Doppler and Intracardiac Opacification            Agent  Indications:    I50.23 Acute on chronic systolic (congestive) heart failure  History:        Patient has prior history of Echocardiogram examinations, most                 recent 06/23/2020. Risk Factors:Diabetes.  Sonographer:    Jonelle Sidle Dance Referring Phys: [id number] [place]  1. Since th elast study on 06/19/2020 LVEF has decreased from 40% to 25-30% with diffuse hypokinesis. RVEF is moderately decreased.  2. Left ventricular ejection fraction, by estimation, is 25 to 30%. The left ventricle has severely decreased function. The left ventricle demonstrates global hypokinesis. The left ventricular internal cavity size  was severely dilated. Left ventricular diastolic function could not be evaluated.  3. Right ventricular systolic function is moderately reduced. The right ventricular size is mildly enlarged.  4. A small pericardial effusion is present. The pericardial effusion is posterior to the left ventricle.  5. Mild mitral valve regurgitation.  6. Aortic valve regurgitation is trivial. FINDINGS  Left Ventricle: Left ventricular ejection fraction, by estimation, is 25 to 30%. The left ventricle has severely decreased function. The left ventricle demonstrates global hypokinesis. Definity contrast agent was given IV to delineate the left ventricular endocardial borders. The left ventricular internal cavity size was severely dilated. Left ventricular diastolic function could not be evaluated. Right Ventricle: The right ventricular size is mildly enlarged. Right ventricular systolic function is moderately reduced. Pericardium: A small pericardial effusion is present. The pericardial effusion is posterior to the left ventricle. Mitral Valve: Mild mitral valve regurgitation. Tricuspid Valve: Tricuspid valve regurgitation is mild. Aortic Valve: Aortic valve regurgitation is trivial. LEFT VENTRICLE PLAX 2D LVIDd:         6.20 cm LVIDs:         5.10 cm LV PW:         1.30 cm LV IVS:        1.20 cm LVOT diam:      2.30 cm LVOT Area:     4.15 cm  LEFT ATRIUM         Index LA diam:    4.20 cm 1.86 cm/m   AORTA Ao Root diam: 3.90 cm  SHUNTS Systemic Diam: 2.30 cm Ena Dawley MD Electronically signed by Ena Dawley MD Signature Date/Time: 06/29/2020/3:30:13 PM    Final    DG FLUORO GUIDE LUMBAR PUNCTURE  Result Date: 06/22/2020 CLINICAL DATA:  Altered mental status. EXAM: DIAGNOSTIC LUMBAR PUNCTURE UNDER FLUOROSCOPIC GUIDANCE FLUOROSCOPY TIME:  Radiation Exposure Index (as provided by the fluoroscopic device): 41.3 mGy If the device does not provide the exposure index: Fluoroscopy Time (in minutes and seconds):  1 minutes 18 seconds Number of Acquired Images:  1 PROCEDURE: Informed consent was obtained from the patient prior to the procedure, including potential complications of headache, allergy, and pain. With the patient prone, the lower back was prepped with Betadine. 1% Lidocaine was used for local anesthesia. Lumbar puncture was performed at the L2-L3 level using a 20 gauge needle with return of clear CSF with an opening pressure of 15 cm water. Ten ml of CSF were obtained for laboratory studies. The patient tolerated the procedure well and there were no apparent complications. IMPRESSION: Successful lumbar puncture. Electronically Signed   By: Suzy Bouchard M.D.   On: 06/22/2020 15:00     Subjective: Very eager to go home  Discharge Exam: Vitals:   07/04/20 0800 07/04/20 1150  BP: (!) 153/91 (!) 156/93  Pulse: 78 75  Resp: 18 18  Temp: (!) 97.5 F (36.4 C) 98.4 F (36.9 C)  SpO2: 95% 96%   Vitals:   07/04/20 0338 07/04/20 0500 07/04/20 0800 07/04/20 1150  BP: 140/87  (!) 153/91 (!) 156/93  Pulse: 74  78 75  Resp: '17  18 18  '$ Temp: 98 F (36.7 C)  (!) 97.5 F (36.4 C) 98.4 F (36.9 C)  TempSrc: Oral  Oral Oral  SpO2: 96%  95% 96%  Weight:  109 kg      General: Pt is alert, awake, not in acute distress Cardiovascular: RRR, S1/S2 +, no rubs, no gallops Respiratory: CTA  bilaterally, no wheezing, no rhonchi Abdominal: Soft, NT,  ND, bowel sounds + Extremities: no edema, no cyanosis   The results of significant diagnostics from this hospitalization (including imaging, microbiology, ancillary and laboratory) are listed below for reference.     Microbiology: No results found for this or any previous visit (from the past 240 hour(s)).   Labs: BNP (last 3 results) Recent Labs    06/18/20 1334  BNP [id number]*   Basic Metabolic Panel: Recent Labs  Lab 06/30/20 0248 07/01/20 0434 07/02/20 0500 07/03/20 0500 07/04/20 0425  NA 142 140 140 138 140  K 3.6 3.6 3.6 3.7 3.6  CL 105 106 108 105 110  CO2 '26 22 22 23 '$ 21*  GLUCOSE 87 121* 118* 117* 110*  BUN 31* 32* 33* 37* 36*  CREATININE 5.20* 5.53* 5.69* 5.92* 5.96*  CALCIUM 8.4* 8.3* 8.1* 8.0* 8.2*  MG 2.2 1.9 2.0 1.9 1.8  PHOS 4.8* 5.0* 5.5* 5.8* 5.6*   Liver Function Tests: Recent Labs  Lab 06/30/20 0248 07/01/20 0434 07/02/20 0500 07/03/20 0500 07/04/20 0425  ALBUMIN 1.8* 1.9* 1.9* 2.0* 1.9*   No results for input(s): LIPASE, AMYLASE in the last 168 hours. No results for input(s): AMMONIA in the last 168 hours. CBC: No results for input(s): WBC, NEUTROABS, HGB, HCT, MCV, PLT in the last 168 hours. Cardiac Enzymes: No results for input(s): CKTOTAL, CKMB, CKMBINDEX, TROPONINI in the last 168 hours. BNP: Invalid input(s): POCBNP CBG: Recent Labs  Lab 07/03/20 1131 07/03/20 1602 07/03/20 2129 07/04/20 0609 07/04/20 1148  GLUCAP 123* 97 148* 104* 123*   D-Dimer No results for input(s): DDIMER in the last 72 hours. Hgb A1c No results for input(s): HGBA1C in the last 72 hours. Lipid Profile Recent Labs    07/04/20 0425  TRIG 106   Thyroid function studies No results for input(s): TSH, T4TOTAL, T3FREE, THYROIDAB in the last 72 hours.  Invalid input(s): FREET3 Anemia work up No results for input(s): VITAMINB12, FOLATE, FERRITIN, TIBC, IRON, RETICCTPCT in the last 72  hours. Urinalysis    Component Value Date/Time   COLORURINE AMBER (A) 06/22/2020 1505   APPEARANCEUR CLOUDY (A) 06/22/2020 1505   LABSPEC 1.031 (H) 06/22/2020 1505   PHURINE 5.0 06/22/2020 1505   GLUCOSEU 50 (A) 06/22/2020 1505   HGBUR MODERATE (A) 06/22/2020 1505   BILIRUBINUR NEGATIVE 06/22/2020 1505   KETONESUR NEGATIVE 06/22/2020 1505   PROTEINUR >=300 (A) 06/22/2020 1505   NITRITE NEGATIVE 06/22/2020 1505   LEUKOCYTESUR LARGE (A) 06/22/2020 1505   Sepsis Labs Invalid input(s): PROCALCITONIN,  WBC,  LACTICIDVEN Microbiology No results found for this or any previous visit (from the past 240 hour(s)).  Time spent: 27mn  SIGNED:   SMarylu Lund MD  [facility] 07/04/2020, 12:25 PM  If 7PM-7AM, please contact night-coverage

## 2020-07-04 NOTE — Progress Notes (Signed)
Gareth Morgan  D/C'd Home per MD order.  Discussed with the patient and all questions fully answered.  VSS, Skin clean, dry and intact without evidence of skin break down, no evidence of skin tears noted. IV catheter discontinued intact. Site without signs and symptoms of complications. Dressing and pressure applied.  An After Visit Summary was printed and given to the patient and familyt received prescription.  D/c education completed with patient/family including follow up instructions, medication list, d/c activities limitations if indicated, with other d/c instructions as indicated by MD - patient and family able to verbalize understanding, all questions fully answered.   Patient instructed to return to ED, call 911, or call MD for any changes in condition.   Patient escorted via [place], and D/C home via private auto.  Dorris Carnes 07/04/2020 7:18 PM

## 2020-07-04 NOTE — TOC Transition Note (Signed)
Transition of Care [facility]) - CM/SW Discharge Note   Patient Details  Name: TYKIM ALARCON MRN: [id number] Date of Birth: 09-08-1985  Transition of Care [facility]) CM/SW Contact:  Pollie Friar, RN Phone Number: 07/04/2020, 1:27 PM   Clinical Narrative:    Pt is discharging home with outpatient therapy through [facility]. Information on the AVS.  New PCP information provided to the wife and on the AVS.  DME for home has already been delivered and taken to the home.  Pt has supervision at home and transportation to home.   Final next level of care: OP Rehab Barriers to Discharge: No Barriers Identified   Patient Goals and CMS Choice   CMS Medicare.gov Compare Post Acute Care list provided to:: Patient Represenative (must comment) Choice offered to / list presented to : [facility]  Discharge Placement                       Discharge Plan and Services   Discharge Planning Services: CM Consult Post Acute Care Choice: Durable Medical Equipment          DME Arranged: 3-N-1,Walker rolling,Wheelchair manual DME Agency: [employer] Date DME Agency Contacted: 06/30/20   Representative spoke with at DME Agency: [employer]            Social Determinants of Health ([place]) Interventions     Readmission Risk Interventions Readmission Risk Prevention Plan 06/23/2020  Transportation Screening Complete  PCP or Specialist Appt within 3-5 Days Complete  HRI or [place] Complete  Social Work Consult for [place] Planning/Counseling Complete  Palliative Care Screening Not Applicable  Medication Review Press photographer) Referral to Pharmacy  Some recent data might be hidden

## 2020-07-04 NOTE — Progress Notes (Signed)
Patient ID: LARRION RINGGER, male   DOB: 1986-04-03, 35 y.o.   MRN: [id number] [facility] Progress Note   Assessment/ Plan:   1. Acute kidney Injury on chronic kidney disease stage IV: Baseline creatinine suspected to be in the mid 3 range (underlying CKD likely from DM) and presented to the hospital with acute kidney injury in the setting of cardiogenic versus hypovolemic shock and ATN.  Initially underwent CRRT and was transitioned to intermittent hemodialysis after becoming more clinically/hemodynamically stable.  Patient received dialysis on 2/3.  Urine output over the last 24 hours of 287 5 mL.  Creatinine slightly increased at 5.96 from 5.92 yesterday.  BUN decreased at 36, sodium 140, potassium 3.6, phosphorus 5.6, albumin 1.9.  Given plateauing creatinine we will remove temporary HD catheter.  Recommend PCP check weekly renal function panels.  Close follow-up with PCP and with [facility].  Provided patient's wife with the number for [facility] but [facility] will also reach out to the patient.  Regarding discharge recommendations for Lasix we are not going to send him home with any Lasix.  Patient reports he already has 40 mg p.o. Lasix at home.  We discussed that he can take this if he starts to notice swelling but he is producing plenty of urine at this time so there is no need right now. 2.  Acute metabolic encephalopathy: Suspected to be multifactorial in origin with severe azotemia/uremia but patient also noted to have had multiple scattered punctate cerebellar infarcts on MRI raising some concern.  Resolved at this time with patient back at baseline. 3.  Congestive heart failure with reduced ejection fraction: 4.  Anemia: Secondary to chronic kidney disease and exacerbated by acute/critical illness 5.  Secondary hyperparathyroidism: Calcium and phosphorus level within acceptable limits; continue to monitor with sequential labs.  Nephrology will sign off at this  time  Subjective:   Patient reports good urine output overnight.  Reports he wants to go home and see his children. Objective:   BP 140/87 (BP Location: Left Arm)   Pulse 74   Temp 98 F (36.7 C) (Oral)   Resp 17   Wt 109 kg   SpO2 96%   BMI 32.59 kg/m   Intake/Output Summary (Last 24 hours) at 07/04/2020 0744 Last data filed at 07/04/2020 0600 Gross per 24 hour  Intake 604.69 ml  Output 2875 ml  Net -2270.31 ml   Weight change: 3.9 kg  Physical Exam: Gen: Sitting comfortably in bed, no acute distress CVS: Regular rate and rhythm, no murmurs appreciated Resp: Lungs are clear to auscultation bilaterally, normal work of breathing Abd: Soft, nontender, positive bowel sounds Ext: Minimal lower extremity edema noted  Imaging: No results found.  Labs: BMET Recent Labs  Lab 06/28/20 0602 06/29/20 0416 06/30/20 0248 07/01/20 0434 07/02/20 0500 07/03/20 0500 07/04/20 0425  NA 141 140 142 140 140 138 140  K 3.6 3.5 3.6 3.6 3.6 3.7 3.6  CL 107 105 105 106 108 105 110  CO2 '25 27 26 22 22 23 '$ 21*  GLUCOSE 217* 112* 87 121* 118* 117* 110*  BUN 40* 23* 31* 32* 33* 37* 36*  CREATININE 5.87* 4.22* 5.20* 5.53* 5.69* 5.92* 5.96*  CALCIUM 8.3* 8.1* 8.4* 8.3* 8.1* 8.0* 8.2*  PHOS 4.4 3.5 4.8* 5.0* 5.5* 5.8* 5.6*   CBC No results for input(s): WBC, NEUTROABS, HGB, HCT, MCV, PLT in the last 168 hours.  Medications:    . (feeding supplement) PROSource Plus  30 mL  Oral TID WC  . aspirin  81 mg Oral Daily  . carvedilol  25 mg Oral BID WC  . chlorhexidine  15 mL Mouth Rinse BID  . Chlorhexidine Gluconate Cloth  6 each Topical Q0600  . clopidogrel  75 mg Oral Daily  . darbepoetin (ARANESP) injection - DIALYSIS  60 mcg Intravenous Q Sat-HD  . famotidine  20 mg Oral QHS  . insulin aspart  0-9 Units Subcutaneous TID WC  . levETIRAcetam  500 mg Oral BID  . mouth rinse  15 mL Mouth Rinse q12n4p  . multivitamin with minerals  1 tablet Oral Daily  . Ensure Max Protein  11 oz Oral  TID BM      Gifford Shave, MD 07/04/2020, 7:44 AM

## 2020-07-09 ENCOUNTER — Telehealth (HOSPITAL_COMMUNITY): Payer: Self-pay

## 2020-07-09 NOTE — Telephone Encounter (Signed)
Pharmacy Transitions of Care Follow-up Telephone Call  Date of discharge: 07/04/20 Discharge Diagnosis: acute encepalopathy  How have you been since you were released from the hospital? Patient doing well. Spoke with wife on the phone.Knows s/sx of bleeding to look for. Patient had first and only incidence of nosebleed today but has had no other s/sx of bleeding. Patient advised to call his PCP if th bleeding does not stop or if bleeding becomes a recurring issue.  Medication changes made at discharge: yes   Medication changes obtained and verified? yes    Medication Accessibility:  [facility] Was the patient provided with refills on discharged medications? no  Have all prescriptions been transferred from [facility] to home pharmacy? No refills given but pt has seen PCP for refills on maintenance meds since discharge   Is the patient able to afford medications? yes    Medication Review:  CLOPIDOGREL (PLAVIX) Clopidogrel 75 mg once daily.   - Advised patient of medications to avoid (NSAIDs, ASA)  - Educated that Tylenol (acetaminophen) will be the preferred analgesic to prevent risk of bleeding  - Emphasized importance of monitoring for signs and symptoms of bleeding (abnormal bruising, prolonged bleeding, nose bleeds, bleeding from gums, discolored urine, black tarry stools)  - Advised patient to alert all providers of anticoagulation therapy prior to starting a new medication or having a procedure    Follow-up Appointments:  Patient has seen PCP since his discharge and received maintenance meds refills. Only on plavix for 21 days so no refills needed at this time.  If their condition worsens, is the pt aware to call PCP or go to the Emergency Dept.? yes  Final Patient Assessment: Patient is well, knows s/sx of bleeding to look out for. Has refills.

## 2020-07-10 LAB — LUPUS ANTICOAGULANT PANEL
DRVVT: 38.8 s (ref 0.0–47.0)
PTT Lupus Anticoagulant: 45.3 s (ref 0.0–51.9)

## 2020-07-10 LAB — VITAMIN B1: Vitamin B1 (Thiamine): 122.5 nmol/L (ref 66.5–200.0)

## 2020-07-11 ENCOUNTER — Other Ambulatory Visit: Payer: Self-pay

## 2020-07-11 ENCOUNTER — Ambulatory Visit: Payer: BC Managed Care – PPO | Attending: Internal Medicine

## 2020-07-11 DIAGNOSIS — M6281 Muscle weakness (generalized): Secondary | ICD-10-CM | POA: Insufficient documentation

## 2020-07-11 DIAGNOSIS — R2681 Unsteadiness on feet: Secondary | ICD-10-CM | POA: Insufficient documentation

## 2020-07-11 NOTE — Patient Instructions (Signed)
Access Code: [email or web address] URL: [email or web address] Date: 07/11/2020 Prepared by: Sharlynn Oliphant  Program Notes stand using 1 arm, cross your arms and sit slowly   Exercises Sit to Stand - 3 x daily - 5 x weekly - 1 sets - 5 reps

## 2020-07-12 NOTE — Therapy (Signed)
[facility] [address] [place], [place], [address] Phone: [phone]   Fax:  [phone]  Physical Therapy Evaluation  Patient Details  Name: Adrian Evans MRN: [id number] Date of Birth: [birth year] Referring Provider (PT): Ricky Ala   Encounter Date: 07/11/2020   PT End of Session - 07/11/20 1841    Visit Number 1    Number of Visits 8    Date for PT Re-Evaluation 09/17/20    Authorization Type BCBS    PT Start Time 1445    PT Stop Time 1530    PT Time Calculation (min) 45 min    Equipment Utilized During Treatment Gait belt    Activity Tolerance Patient tolerated treatment well    Behavior During Therapy [facility] for tasks assessed/performed           Past Medical History:  Diagnosis Date  . Diabetes mellitus without complication [facility])     Past Surgical History:  Procedure Laterality Date  . EYE SURGERY    . RIGHT/LEFT HEART CATH AND CORONARY ANGIOGRAPHY N/A 05/25/2018   Procedure: RIGHT/LEFT HEART CATH AND CORONARY ANGIOGRAPHY;  Surgeon: Corey Skains, MD;  Location: [place] CV LAB;  Service: Cardiovascular;  Laterality: N/A;  . TONSILLECTOMY      There were no vitals filed for this visit.    Subjective Assessment - 07/11/20 1452    Subjective 35 y.o. male presenting to [facility] on 1/26 with n/v, malaise, weakness, and poor PO intake. Noted worsening AMS with unclear etiology. Patient admitted with acute encephalopathy 2/2 severe metabolic acidosis, acute renal failure on CKD IIIa now on HD, acute hypoxic respiratory failure, acute on chronic CHF, and possible seizures. Liver mass in R hepatic lobe noted on renal ultrasound with plan for outpatient follow-up. Transfer to [facility] 1/29. PMHx significant for HTN, DMI, & CKD III.  Todays c/o center around balance deficts especially negotiating slopes    Patient is accompained by: Family member    Pertinent History spouse [name]    Limitations Walking     How long can you sit comfortably? n/a    How long can you stand comfortably? n/a    How long can you walk comfortably? 15 minutes    Currently in Pain? No/denies    Pain Score 0-No pain               07/11/20 0001  Assessment  Medical Diagnosis CVA  Referring Provider (PT) Ricky Ala  Onset Date/Surgical Date 06/18/20  Next MD Visit 4/26  Prior Therapy CIR  Precautions  Precautions Fall  Precaution Comments none issued  Balance Screen  Has the patient fallen in the past 6 months No  [place] residence  Living Arrangements Spouse/significant other;Children  Available Help at Discharge Family  Type of [place] to enter  Entrance Stairs-Number of Steps 0  Entrance Stairs-Rails None  Home Layout Two level  Alternate Level Stairs-Number of Steps 16  Alternate Level Stairs-Rails Right  Additional Comments WC and RW sent home with him  Prior Function  Level of Independence Independent  Vocation Full time employment  Museum/gallery curator  Transfers  Transfers Sit to Stand;Stand to Sit  Sit to Stand 5: Supervision  Sit to Stand Details Verbal cues for sequencing  Sit to Stand Details (indicate cue type and reason) B UE support needed, 11.56s  Timed Up and Go Test  Normal TUG (seconds) 9.8  High Level Balance  High Level Balance Comments able to tolerate all 4 positions on CTSIB for 30 seconds                Objective measurements completed on examination: See above findings.               PT Education - 07/12/20 1838    Education Details see medbridge notes    Person(s) Educated Patient;Child(ren)   dtr Peter Congo   Methods Explanation;Demonstration;Handout    Comprehension Verbalized understanding;Returned demonstration            PT Short Term Goals - 07/12/20 1853      PT SHORT TERM GOAL #1   Title Demo I in initial HEP    Baseline no HEP    Time 2    Period Weeks     Status New    Target Date 07/26/20      PT SHORT TERM GOAL #2   Title able to STS w/o need of UE support    Baseline needs single UE to transfer    Time 2    Period Weeks    Status New    Target Date 07/26/20      PT SHORT TERM GOAL #3   Title Ambulate with SPC vs. walking stick safely    Baseline S with walkng stick    Time 2    Period Weeks    Status New    Target Date 07/26/20             PT Long Term Goals - 07/12/20 1857      PT LONG TERM GOAL #1   Title patient to demo 4+/5 BLE strength    Baseline 4/5 BLE strength    Time 4    Period Weeks    Status New    Target Date 08/09/20      PT LONG TERM GOAL #2   Title Patient to ambulate 1021f with LRAD and distant S    Baseline in clinic distances with S and walking stick    Time 4    Period Weeks    Status New    Target Date 08/09/20      PT LONG TERM GOAL #3   Title Assess FGA and set/achieve appropriate goal    Baseline n/a    Time 4    Period Weeks    Status New    Target Date 08/09/20                  Plan - 07/12/20 1843    Clinical Impression Statement 35[facility]. [facility], malaise, weakness, and poor PO intake. Noted worsening AMS with unclear etiology. Patient admitted with acute encephalopathy 2/2 severe metabolic acidosis, acute renal failure on CKD IIIa now on HD, acute hypoxic respiratory failure, acute on chronic CHF, and possible seizures. Liver mass in R hepatic lobe noted on renal ultrasound with plan for outpatient follow-up. [facility] PMHx significant for HTN, DMI, & CKD III.  Today patient presents A&Ox3, ambulating with S and walking stick, accompanied by dtr, able to stand with UE assist, presents mainly wit BLE weakness and mild balance deficts secondarily    Personal Factors and Comorbidities Comorbidity 1;Comorbidity 2    Comorbidities DM CHF    Stability/Clinical Decision Making Stable/Uncomplicated    Clinical Decision Making Low     Rehab Potential Good    PT Frequency 2x / week    PT  Duration 4 weeks    PT Treatment/Interventions Aquatic Therapy;DME Instruction;Gait training;Stair training;Functional mobility training;Therapeutic activities;Therapeutic exercise;Balance training;Neuromuscular re-education;Patient/family education;Orthotic Fit/Training    PT Next Visit Plan BLE strength and balance training, assess for gait with cane vs. walking stick    PT Home Exercise Plan STS transfer training    Recommended Other Services OT and ST ordered    Consulted and Agree with Plan of Care Patient;Family member/caregiver    Family Member Consulted dtr Peter Congo           Patient will benefit from skilled therapeutic intervention in order to improve the following deficits and impairments:  Abnormal gait,Decreased balance,Decreased endurance,Decreased mobility,Difficulty walking,Decreased activity tolerance,Decreased coordination,Decreased safety awareness,Decreased strength  Visit Diagnosis: Unsteadiness on feet  Muscle weakness (generalized)     Problem List Patient Active Problem List   Diagnosis Date Noted  . Acute cerebral infarction ([place])   . Diabetes mellitus type I ([place])   . Stage 3 chronic kidney disease ([place])   . Essential hypertension   . Chronic systolic congestive heart failure ([place])   . Dysphagia, post-stroke   . Acute encephalopathy 06/21/2020  . Acute renal failure (ARF) ([place]) 06/18/2020  . Acute CHF (congestive heart failure) ([place]) 05/22/2018  . Iron deficiency anemia 04/16/2018    Lanice Shirts PT 07/12/2020, 7:01 PM  [facility] [address] [place], [place], [address] Phone: [phone]   Fax:  [phone]  Name: Adrian Evans MRN: [id number] Date of Birth: 08-11-1985

## 2020-07-13 LAB — CULTURE, FUNGUS WITHOUT SMEAR

## 2020-07-16 ENCOUNTER — Ambulatory Visit: Payer: BC Managed Care – PPO

## 2020-07-16 ENCOUNTER — Other Ambulatory Visit: Payer: Self-pay

## 2020-07-16 DIAGNOSIS — R2681 Unsteadiness on feet: Secondary | ICD-10-CM | POA: Diagnosis not present

## 2020-07-16 DIAGNOSIS — M6281 Muscle weakness (generalized): Secondary | ICD-10-CM

## 2020-07-16 NOTE — Patient Instructions (Signed)
Access Code: [email or web address] URL: [email or web address] Date: 07/16/2020 Prepared by: Sharlynn Oliphant  Exercises Side Stepping with Resistance at Thighs - 2 x daily - 5 x weekly - 10 reps Green band

## 2020-07-16 NOTE — Therapy (Signed)
[facility] [address] [place], [place], [address] Phone: [phone]   Fax:  [phone]  Physical Therapy Treatment  Patient Details  Name: Adrian Evans MRN: [id number] Date of Birth: [birth year] Referring Provider (PT): Ricky Ala   Encounter Date: 07/16/2020   PT End of Session - 07/16/20 1814    Visit Number 2    Number of Visits 8    Date for PT Re-Evaluation 09/17/20    Authorization Type BCBS    PT Start Time 1615    PT Stop Time 1700    PT Time Calculation (min) 45 min    Equipment Utilized During Treatment Gait belt    Activity Tolerance Patient tolerated treatment well    Behavior During Therapy [facility] for tasks assessed/performed           Past Medical History:  Diagnosis Date  . Diabetes mellitus without complication [facility])     Past Surgical History:  Procedure Laterality Date  . EYE SURGERY    . RIGHT/LEFT HEART CATH AND CORONARY ANGIOGRAPHY N/A 05/25/2018   Procedure: RIGHT/LEFT HEART CATH AND CORONARY ANGIOGRAPHY;  Surgeon: Corey Skains, MD;  Location: [facility];  Service: Cardiovascular;  Laterality: N/A;  . TONSILLECTOMY      There were no vitals filed for this visit.   Subjective Assessment - 07/16/20 1811    Subjective No c/o pain or med changes, no new MD visits, has been compliant with STS exercises, has DCed need for AD and feels better each week    Patient is accompained by: Family member    Pertinent History spouse [name]    Limitations Walking    How long can you sit comfortably? n/a    How long can you stand comfortably? n/a    How long can you walk comfortably? 15 minutes    Currently in Pain? No/denies    Pain Score 0-No pain              [facility] PT Assessment - 07/16/20 0001      Functional Gait  Assessment   Gait assessed  Yes    Gait Level Surface Walks 20 ft in less than 7 sec but greater than 5.5 sec, uses assistive device, slower speed, mild  gait deviations, or deviates 6-10 in outside of the 12 in walkway width.    Change in Gait Speed Able to smoothly change walking speed without loss of balance or gait deviation. Deviate no more than 6 in outside of the 12 in walkway width.    Gait with Horizontal Head Turns Performs head turns smoothly with no change in gait. Deviates no more than 6 in outside 12 in walkway width    Gait with Vertical Head Turns Performs head turns with no change in gait. Deviates no more than 6 in outside 12 in walkway width.    Gait and Pivot Turn Pivot turns safely within 3 sec and stops quickly with no loss of balance.    Step Over Obstacle Is able to step over 2 stacked shoe boxes taped together (9 in total height) without changing gait speed. No evidence of imbalance.    Gait with Narrow Base of Support Ambulates less than 4 steps heel to toe or cannot perform without assistance.    Gait with Eyes Closed Walks 20 ft, uses assistive device, slower speed, mild gait deviations, deviates 6-10 in outside 12 in walkway width. Ambulates 20 ft in less than 9 sec but  greater than 7 sec.    Ambulating Backwards Walks 20 ft, uses assistive device, slower speed, mild gait deviations, deviates 6-10 in outside 12 in walkway width.    Steps Alternating feet, must use rail.    Total Score 23                         [facility] Adult PT Treatment/Exercise - 07/16/20 0001      Transfers   Transfers Sit to Stand    Sit to Stand Details (indicate cue type and reason) performed 15x from airex under close S with B UE support               Balance Exercises - 07/16/20 0001      Balance Exercises: Standing   Rockerboard Anterior/posterior;Lateral;EO;30 seconds    Rockerboard Limitations no UE support needed 30s hold x2 in M/L, A/P and tandem stand both positions, performed in // bars    Sidestepping 5 reps    Theraband Level (Sidestepping) Level 3 (Green)    Sidestepping Limitations performed in // bars with  intermittent UE support             PT Education - 07/16/20 1813    Education Details added sidestepping against band    Person(s) Educated Patient;Spouse    Methods Explanation;Demonstration;Handout    Comprehension Verbalized understanding;Returned demonstration            PT Short Term Goals - 07/12/20 1853      PT SHORT TERM GOAL #1   Title Demo I in initial HEP    Baseline no HEP    Time 2    Period Weeks    Status New    Target Date 07/26/20      PT SHORT TERM GOAL #2   Title able to STS w/o need of UE support    Baseline needs single UE to transfer    Time 2    Period Weeks    Status New    Target Date 07/26/20      PT SHORT TERM GOAL #3   Title Ambulate with SPC vs. walking stick safely    Baseline S with walkng stick    Time 2    Period Weeks    Status New    Target Date 07/26/20             PT Long Term Goals - 07/16/20 1818      PT LONG TERM GOAL #1   Title patient to demo 4+/5 BLE strength    Baseline 4/5 BLE strength    Time 4    Period Weeks    Status New      PT LONG TERM GOAL #2   Title Patient to ambulate 1056f with LRAD and distant S    Baseline in clinic distances with S and walking stick    Time 4    Period Weeks    Status New      PT LONG TERM GOAL #3   Title Assess FGA and set/achieve appropriate goal; 07/16/20 FGA goal 27    Baseline n/a; 07/16/20 FGA 23    Time 4    Period Weeks    Status New                 Plan - 07/16/20 1814    Clinical Impression Statement Improving STS capacity, todays skilled session focused on LE strength and balance training, patient has weaned off of  cane w/o incident, assess FGA and goal set    Personal Factors and Comorbidities Comorbidity 1;Comorbidity 2    Comorbidities DM CHF    Stability/Clinical Decision Making Stable/Uncomplicated    Rehab Potential Good    PT Frequency 2x / week    PT Duration 4 weeks    PT Treatment/Interventions Aquatic Therapy;DME Instruction;Gait  training;Stair training;Functional mobility training;Therapeutic activities;Therapeutic exercise;Balance training;Neuromuscular re-education;Patient/family education;Orthotic Fit/Training    PT Next Visit Plan continue high level balance training and LE strengthening, most deficits noted with tandem skills    PT Home Exercise Plan STS transfer training    Consulted and Agree with Plan of Care Patient;Family member/caregiver    Family Member Consulted dtr Peter Congo           Patient will benefit from skilled therapeutic intervention in order to improve the following deficits and impairments:  Abnormal gait,Decreased balance,Decreased endurance,Decreased mobility,Difficulty walking,Decreased activity tolerance,Decreased coordination,Decreased safety awareness,Decreased strength  Visit Diagnosis: Unsteadiness on feet  Muscle weakness (generalized)     Problem List Patient Active Problem List   Diagnosis Date Noted  . Acute cerebral infarction ([place])   . Diabetes mellitus type I ([place])   . Stage 3 chronic kidney disease ([place])   . Essential hypertension   . Chronic systolic congestive heart failure ([place])   . Dysphagia, post-stroke   . Acute encephalopathy 06/21/2020  . Acute renal failure (ARF) ([place]) 06/18/2020  . Acute CHF (congestive heart failure) ([place]) 05/22/2018  . Iron deficiency anemia 04/16/2018    Lanice Shirts PT 07/16/2020, 6:23 PM  [facility] [address] [place], [place], [address] Phone: [phone]   Fax:  [phone]  Name: Adrian Evans MRN: [id number] Date of Birth: 12/30/1985

## 2020-07-18 ENCOUNTER — Other Ambulatory Visit: Payer: Self-pay

## 2020-07-18 ENCOUNTER — Ambulatory Visit: Payer: BC Managed Care – PPO | Admitting: Internal Medicine

## 2020-07-18 ENCOUNTER — Ambulatory Visit: Payer: BC Managed Care – PPO

## 2020-07-18 ENCOUNTER — Other Ambulatory Visit: Payer: Self-pay | Admitting: Internal Medicine

## 2020-07-18 ENCOUNTER — Encounter: Payer: Self-pay | Admitting: Internal Medicine

## 2020-07-18 VITALS — BP 156/102 | HR 76 | Ht 72.0 in | Wt 215.0 lb

## 2020-07-18 DIAGNOSIS — R2681 Unsteadiness on feet: Secondary | ICD-10-CM | POA: Diagnosis not present

## 2020-07-18 DIAGNOSIS — R002 Palpitations: Secondary | ICD-10-CM

## 2020-07-18 DIAGNOSIS — N184 Chronic kidney disease, stage 4 (severe): Secondary | ICD-10-CM

## 2020-07-18 DIAGNOSIS — I5020 Unspecified systolic (congestive) heart failure: Secondary | ICD-10-CM

## 2020-07-18 DIAGNOSIS — M6281 Muscle weakness (generalized): Secondary | ICD-10-CM

## 2020-07-18 DIAGNOSIS — I10 Essential (primary) hypertension: Secondary | ICD-10-CM

## 2020-07-18 MED ORDER — HYDRALAZINE HCL 25 MG PO TABS: 25.0000 mg | ORAL_TABLET | Freq: Two times a day (BID) | ORAL | 3 refills | Status: DC

## 2020-07-18 NOTE — Patient Instructions (Addendum)
Medication Instructions:  START: hydralazine (Apresoline) '25mg'$  by mouth twice daily  *If you need a refill on your cardiac medications before your next appointment, please call your pharmacy*   Lab Work: TODAY: TSH and BMP If you have labs (blood work) drawn today and your tests are completely normal, you will receive your results only by: Marland Kitchen MyChart Message (if you have MyChart) OR . A paper copy in the mail If you have any lab test that is abnormal or we need to change your treatment, we will call you to review the results.   Testing/Procedures: Your physician has requested that you wear a heart monitor.    Follow-Up: At [facility], you and your health needs are our priority.  As part of our continuing mission to provide you with exceptional heart care, we have created designated Provider Care Teams.  These Care Teams include your primary Cardiologist (physician) and Advanced Practice Providers (APPs -  Physician Assistants and Nurse Practitioners) who all work together to provide you with the care you need, when you need it.  We recommend signing up for the patient portal called "MyChart".  Sign up information is provided on this After Visit Summary.  MyChart is used to connect with patients for Virtual Visits (Telemedicine).  Patients are able to view lab/test results, encounter notes, upcoming appointments, etc.  Non-urgent messages can be sent to your provider as well.   To learn more about what you can do with MyChart, go to [email or web address].    Your next appointment:   3 month(s)  The format for your next appointment:   In Person  Provider:   You may see Gasper Sells, MD or one of the following Advanced Practice Providers on your designated Care Team:    Melina Copa, PA-C  Ermalinda Barrios, PA-C    Other Instructions  Bryn Gulling- Long Term Monitor Instructions   Your physician has requested you wear your ZIO patch monitor___14____days.   This is a single patch  monitor.  [employer] supplies one patch monitor per enrollment.  Additional stickers are not available.   Please do not apply patch if you will be having a Nuclear Stress Test, Echocardiogram, Cardiac CT, MRI, or Chest Xray during the time frame you would be wearing the monitor. The patch cannot be worn during these tests.  You cannot remove and re-apply the ZIO XT patch monitor.   Your ZIO patch monitor will be sent USPS Priority mail from [facility] directly to your home address. The monitor may also be mailed to a PO BOX if home delivery is not available.   It may take 3-5 days to receive your monitor after you have been enrolled.   Once you have received you monitor, please review enclosed instructions.  Your monitor has already been registered assigning a specific monitor serial # to you.   Applying the monitor   Shave hair from upper left chest.   Hold abrader disc by orange tab.  Rub abrader in 40 strokes over left upper chest as indicated in your monitor instructions.   Clean area with 4 enclosed alcohol pads .  Use all pads to assure are is cleaned thoroughly.  Let dry.   Apply patch as indicated in monitor instructions.  Patch will be place under collarbone on left side of chest with arrow pointing upward.   Rub patch adhesive wings for 2 minutes.Remove white label marked "1".  Remove white label marked "2".  Rub patch adhesive wings for 2 additional minutes.  While looking in a mirror, press and release button in center of patch.  A small green light will flash 3-4 times .  This will be your only indicator the monitor has been turned on.     Do not shower for the first 24 hours.  You may shower after the first 24 hours.   Press button if you feel a symptom. You will hear a small click.  Record Date, Time and Symptom in the Patient Log Book.   When you are ready to remove patch, follow instructions on last 2 pages of Patient Log Book.  Stick patch monitor onto last page of  Patient Log Book.   Place Patient Log Book in [place] box.  Use locking tab on box and tape box closed securely.  The [employer] has [employer] on it.  Please place in mailbox as soon as possible.  Your physician should have your test results approximately 7 days after the monitor has been mailed back to [facility].   Call [place] at [phone] if you have questions regarding your ZIO XT patch monitor.  Call them immediately if you see an orange light blinking on your monitor.   If your monitor falls off in less than 4 days contact our Monitor department at [phone].  If your monitor becomes loose or falls off after 4 days call [employer] at [phone] for suggestions on securing your monitor.

## 2020-07-18 NOTE — Patient Instructions (Signed)
Access Code: [email or web address] URL: [email or web address] Date: 07/18/2020 Prepared by: Sharlynn Oliphant  Exercises Tandem Walking with Counter Support - 1 x daily - 5 x weekly - 3 sets - 10 reps Backwards Walking - 1 x daily - 5 x weekly - 3 sets - 10 reps

## 2020-07-18 NOTE — Progress Notes (Signed)
Cardiology Office Note:    Date:  07/18/2020   ID:  Adrian Evans, DOB 02-Jun-1985, MRN [id number]  PCP:  Adrian Simmer, MD   [place]  Cardiologist:  No primary care provider on file.  Advanced Practice Provider:  No care team member to display Electrophysiologist:  None       CC: Follow up for Heart Failure Consulted for the evaluation of HF at the behest of Seeley, Adrian Sledge, MD  History of Present Illness:    Adrian Evans is a 35 y.o. male with a hx of HFrEF, HTN with T1DM, Prior Stroke, CKD Stage IV, and IDA who presents for evaluation 07/18/20.  Patient was initially diagnosed with HF in 2019.  Had leg swelling and CKD at that time.  Was doing well in that interim.  Sugars were under control.  Taking his BP medications.  Had acute encephalopathy evaluation 06/19/20- thought to be either stroke or uremic encephalopathy.  Through that, he AKI and came close to if ne needed HD.  Presently there are no plans for dialysis.  Discharged 07/04/20.  Patient notes that he is feeling good now.  Has had no chest pain, chest pressure, chest tightness, chest stinging.    Patient exertion notable for walking without issues with and feels no symptoms.  No shortness of breath, does note some DOE.  No PND or orthopnea.  No bendopnea,  Notes weight loss of about 10 lbs; no leg swelling, or abdominal swelling.  No syncope or near syncope. Notes possible occasional palpitations or funny heart beats.     Ambulatory BP 140-150/80-90   Past Medical History:  Diagnosis Date  . Acute cerebral infarction ([place])   . Acute CHF (congestive heart failure) ([place]) 05/22/2018  . Acute encephalopathy 06/21/2020  . Acute renal failure (ARF) ([place]) 06/18/2020  . Chronic systolic congestive heart failure ([place])   . Diabetes mellitus type I ([place])   . Diabetes mellitus without complication ([place])   . Dysphagia, post-stroke   . Essential hypertension   . Iron deficiency anemia  04/16/2018  . Stage 3 chronic kidney disease [facility])     Past Surgical History:  Procedure Laterality Date  . EYE SURGERY    . RIGHT/LEFT HEART CATH AND CORONARY ANGIOGRAPHY N/A 05/25/2018   Procedure: RIGHT/LEFT HEART CATH AND CORONARY ANGIOGRAPHY;  Surgeon: Corey Skains, MD;  Location: [place] [facility];  Service: Cardiovascular;  Laterality: N/A;  . TONSILLECTOMY      Current Medications: Current Meds  Medication Sig  . acetaminophen (TYLENOL) 325 MG tablet Take 2 tablets (650 mg total) by mouth every 6 (six) hours as needed for mild pain (or Fever >/= 101).  Marland Kitchen aspirin 81 MG chewable tablet Chew 1 tablet (81 mg total) by mouth daily.  . bisacodyl (DULCOLAX) 5 MG EC tablet Take 1 tablet (5 mg total) by mouth daily as needed for moderate constipation.  . carvedilol (COREG) 25 MG tablet Take 1 tablet (25 mg total) by mouth 2 (two) times daily with a meal.  . Chlorhexidine Gluconate Cloth 2 % PADS Apply 6 each topically daily.  . clopidogrel (PLAVIX) 75 MG tablet Take 1 tablet (75 mg total) by mouth daily for 21 days.  . furosemide (LASIX) 40 MG tablet Take 1 tablet (40 mg total) by mouth daily as needed for fluid or edema.  . hydrALAZINE (APRESOLINE) 25 MG tablet Take 1 tablet (25 mg total) by mouth 2 (two) times daily.  . insulin  aspart (NOVOLOG) 100 UNIT/ML FlexPen Sliding scale CBG 70 - 120: 0 units  CBG 121 - 150: 1 unit  CBG 151 - 200: 2 units  CBG 201 - 250: 3 units  CBG 251 - 300: 5 units  CBG 301 - 350: 7 units  CBG 351 - 400 9 units  . insulin aspart (NOVOLOG) 100 UNIT/ML FlexPen Sliding scale: CBG 70 - 120: 0 units  CBG 121 - 150: 1 unit  CBG 151 - 200: 2 units  CBG 201 - 250: 3 units  CBG 251 - 300: 5 units  CBG 301 - 350: 7 units  CBG 351 - 400 9 units  . Insulin Pen Needle 31G X 5 MM MISC 1 Device by Does not apply route 3 (three) times daily as needed (for insulin pen).  Marland Kitchen levETIRAcetam (KEPPRA) 500 MG tablet Take 1 tablet (500 mg total) by mouth 2 (two)  times daily.  . sodium chloride 0.9 % infusion Inject 250 mLs into the vein continuous.     Allergies:   Patient has no known allergies.   Social History   Socioeconomic History  . Marital status: Married    Spouse name: Not on file  . Number of children: 2  . Years of education: Not on file  . Highest education level: Not on file  Occupational History  . Not on file  Tobacco Use  . Smoking status: Never Smoker  . Smokeless tobacco: Never Used  Substance and Sexual Activity  . Alcohol use: Not Currently  . Drug use: Not Currently  . Sexual activity: Not Currently  Other Topics Concern  . Not on file  Social History Narrative   Education officer, museum   Social Determinants of Health   Financial Resource Strain: Not on file  Food Insecurity: Not on file  Transportation Needs: Not on file  Physical Activity: Not on file  Stress: Not on file  Social Connections: Not on file     Family History: History of coronary artery disease notable for no members. History of heart failure notable for no members. No history of cardiomyopathies including hypertrophic cardiomyopathy, left ventricular non-compaction, or arrhythmogenic right ventricular cardiomyopathy. History of arrhythmia notable for no members. Rogers unexpectedly of unknown cause- possible marijuana laced medication. No history of bicuspid aortic valve or aortic aneurysm or dissection.  ROS:   Please see the history of present illness.     All other systems reviewed and are negative.  EKGs/Labs/Other Studies Reviewed:    The following studies were reviewed today:  EKG:   06/24/20: SR 91 QRS durration 108 but with frequent outflow tract PVCs  Transthoracic Echocardiogram: Date: 06/29/20 Results: 1. Since th elast study on 06/19/2020 LVEF has decreased from 40% to  25-30% with diffuse hypokinesis. RVEF is moderately decreased.  2. Left ventricular ejection fraction, by estimation, is 25 to 30%. The   left ventricle has severely decreased function. The left ventricle  demonstrates global hypokinesis. The left ventricular internal cavity size  was severely dilated. Left ventricular  diastolic function could not be evaluated.  3. Right ventricular systolic function is moderately reduced. The right  ventricular size is mildly enlarged.  4. A small pericardial effusion is present. The pericardial effusion is  posterior to the left ventricle.  5. Mild mitral valve regurgitation.  6. Aortic valve regurgitation is trivial.   Transesophageal Echocardiogram: Date: 06/23/20 Results: 1. Negative bubble study.  2. No thrombus seen in the left atrium, left atrial appendage or  left  ventricle. Because of thickening in the left ventricular apex a Definity  echocontrast was used, but only prominent trabeculations and a large  papillary muscle were seen.  3. Left ventricular ejection fraction, by estimation, is 20 to 25%. The  left ventricle has severely decreased function. The left ventricle has no  regional wall motion abnormalities. The left ventricular internal cavity  size was mildly dilated.  4. Right ventricular systolic function is moderately reduced. The right  ventricular size is moderately enlarged.  5. Left atrial size was moderately dilated. No left atrial/left atrial  appendage thrombus was detected. The LAA emptying velocity was 40 cm/s.  6. Right atrial size was moderately dilated.  7. A small pericardial effusion is present. The pericardial effusion is  circumferential. There is no evidence of cardiac tamponade.  8. The mitral valve is normal in structure. Mild mitral valve  regurgitation. No evidence of mitral stenosis.  9. The tricuspid valve is myxomatous. Tricuspid valve regurgitation is  moderate.  10. The aortic valve is normal in structure. Aortic valve regurgitation is  trivial. No aortic stenosis is present.  11. The inferior vena cava is normal in size  with greater than 50%  respiratory variability, suggesting right atrial pressure of 3 mmHg.  12. Agitated saline contrast bubble study was negative, with no evidence  of any interatrial shunt.   Conclusion(s)/Recommendation(s): No LA/LAA thrombus identified. Negative  bubble study for interatrial shunt. No intracardiac source of embolism  detected on this on this transesophageal echocardiogram.  Left/Right Heart Catheterizations: Date: 07/18/20 Results:  Hemodynamic findings consistent with moderate pulmonary hypertension.  There is mild (2+) tricuspid regurgitation.   Assessment The patient has had Acute systolic dysfunction congestive heart failure with New York Heart Association Class IV symptoms.  reduced left ventricular function with ejection fraction of 20%  Pulmonary capillary wedge pressures with moderate elevation. moderate pulmonary hypertension  normal coronary arteries with no 3 vessel arterial disease and up to 0% stenosis  Plan Aggressive medical management of congestive heart failure with appropriate use of beta blockers, ACE inhibitors, and diuretics. Congestive heart failure education and rehabilitation have been recommended.     Recent Labs: 06/18/2020: B Natriuretic Peptide 980.9 06/22/2020: ALT 10 06/27/2020: Hemoglobin 8.5; Platelets 290 07/04/2020: BUN 36; Creatinine, Ser 5.96; Magnesium 1.8; Potassium 3.6; Sodium 140  Recent Lipid Panel    Component Value Date/Time   CHOL 111 06/22/2020 1038   TRIG 106 07/04/2020 0425   HDL 31 (L) 06/22/2020 1038   CHOLHDL 3.6 06/22/2020 1038   VLDL 36 06/22/2020 1038   LDLCALC 44 06/22/2020 1038   Risk Assessment/Calculations:     N/A  Physical Exam:    VS:  BP (!) 156/102   Pulse 76   Ht 6' (1.829 m)   Wt 215 lb (97.5 kg)   SpO2 99%   BMI 29.16 kg/m     Wt Readings from Last 3 Encounters:  07/18/20 215 lb (97.5 kg)  07/04/20 240 lb 4.8 oz (109 kg)  06/21/20 217 lb 6 oz (98.6 kg)    GEN:  Well  nourished, well developed in no acute distress HEENT: Normal NECK: No JVD; No carotid bruits LYMPHATICS: No lymphadenopathy CARDIAC: RRR, no murmurs, rubs, gallops RESPIRATORY:  Clear to auscultation without rales, wheezing or rhonchi  ABDOMEN: Soft, non-tender, non-distended MUSCULOSKELETAL:  No edema; No deformity  SKIN: Warm and dry NEUROLOGIC:  Alert and oriented x 3 PSYCHIATRIC:  Normal affect   ASSESSMENT:    1. HFrEF (heart  failure with reduced ejection fraction) ([place])   2. Chronic kidney disease (CKD), stage IV (severe) ([place])   3. Essential hypertension   4. Palpitations    PLAN:    In order of problems listed above:  Heart Failure Reduced Ejection Fraction  CKD Stage IV HTN with T1 DM - NYHA class II, Stage B, euvolemic, etiology from unknown causes - Diuretic regimen: Lasix 40 mg PO - Strict I/Os, daily weights, and fluid restriction of < 2 L education given  - BMP - Coreg 25 mg PO BID - ARNI/ARB/ACEi;  Aldactone- held in the setting of kidney issues - Hydralazine 25 mg PO BID  - SGLT2i held off with kidney and T1 DM -Ferritin 796 -HIV- non reactive - SPEP normal to no elevation 06/18/20 - check TSH - Will check CT and CMR in the future based on his CKD  Palpitations - will obtain 14-day non live heart monitor (ZioPatch) - also to assess HFrEF etiology  Three months follow up unless new symptoms or abnormal test results warranting change in plan  Would be reasonable for  Video Visit Follow up Would be reasonable for  APP Follow up       Medication Adjustments/Labs and Tests Ordered: Current medicines are reviewed at length with the patient today.  Concerns regarding medicines are outlined above.  Orders Placed This Encounter  Procedures  . TSH  . Basic metabolic panel  . EKG 12-Lead   Meds ordered this encounter  Medications  . hydrALAZINE (APRESOLINE) 25 MG tablet    Sig: Take 1 tablet (25 mg total) by mouth 2 (two) times daily.    Dispense:   180 tablet    Refill:  3    Patient Instructions  Medication Instructions:  START: hydralazine (Apresoline) '25mg'$  by mouth twice daily  *If you need a refill on your cardiac medications before your next appointment, please call your pharmacy*   Lab Work: TODAY: TSH and BMP If you have labs (blood work) drawn today and your tests are completely normal, you will receive your results only by: Marland Kitchen MyChart Message (if you have MyChart) OR . A paper copy in the mail If you have any lab test that is abnormal or we need to change your treatment, we will call you to review the results.   Testing/Procedures: Your physician has requested that you wear a heart monitor.    Follow-Up: At [facility], you and your health needs are our priority.  As part of our continuing mission to provide you with exceptional heart care, we have created designated Provider Care Teams.  These Care Teams include your primary Cardiologist (physician) and Advanced Practice Providers (APPs -  Physician Assistants and Nurse Practitioners) who all work together to provide you with the care you need, when you need it.  We recommend signing up for the patient portal called "MyChart".  Sign up information is provided on this After Visit Summary.  MyChart is used to connect with patients for Virtual Visits (Telemedicine).  Patients are able to view lab/test results, encounter notes, upcoming appointments, etc.  Non-urgent messages can be sent to your provider as well.   To learn more about what you can do with MyChart, go to [email or web address].    Your next appointment:   3 month(s)  The format for your next appointment:   In Person  Provider:   You may see Gasper Sells, MD or one of the following Advanced Practice Providers on your designated Care Team:  Dayna Dunn, PA-C  Ermalinda Barrios, PA-C    Other Instructions  Bryn Gulling- Long Term Monitor Instructions   Your physician has requested you wear your ZIO  patch monitor___14____days.   This is a single patch monitor.  Ir[employer] supplies one patch monitor per enrollment.  Additional stickers are not available.   Please do not apply patch if you will be having a Nuclear Stress Test, Echocardiogram, Cardiac CT, MRI, or Chest Xray during the time frame you would be wearing the monitor. The patch cannot be worn during these tests.  You cannot remove and re-apply the ZIO XT patch monitor.   Your ZIO patch monitor will be sent USPS Priority mail from [facility] directly to your home address. The monitor may also be mailed to a PO BOX if home delivery is not available.   It may take 3-5 days to receive your monitor after you have been enrolled.   Once you have received you monitor, please review enclosed instructions.  Your monitor has already been registered assigning a specific monitor serial # to you.   Applying the monitor   Shave hair from upper left chest.   Hold abrader disc by orange tab.  Rub abrader in 40 strokes over left upper chest as indicated in your monitor instructions.   Clean area with 4 enclosed alcohol pads .  Use all pads to assure are is cleaned thoroughly.  Let dry.   Apply patch as indicated in monitor instructions.  Patch will be place under collarbone on left side of chest with arrow pointing upward.   Rub patch adhesive wings for 2 minutes.Remove white label marked "1".  Remove white label marked "2".  Rub patch adhesive wings for 2 additional minutes.   While looking in a mirror, press and release button in center of patch.  A small green light will flash 3-4 times .  This will be your only indicator the monitor has been turned on.     Do not shower for the first 24 hours.  You may shower after the first 24 hours.   Press button if you feel a symptom. You will hear a small click.  Record Date, Time and Symptom in the Patient Log Book.   When you are ready to remove patch, follow instructions on last 2 pages of  Patient Log Book.  Stick patch monitor onto last page of Patient Log Book.   Place Patient Log Book in [place] box.  Use locking tab on box and tape box closed securely.  The [employer] has [employer] on it.  Please place in mailbox as soon as possible.  Your physician should have your test results approximately 7 days after the monitor has been mailed back to [facility].   Call [place] at [phone] if you have questions regarding your ZIO XT patch monitor.  Call them immediately if you see an orange light blinking on your monitor.   If your monitor falls off in less than 4 days contact our Monitor department at [phone].  If your monitor becomes loose or falls off after 4 days call [employer] at [phone] for suggestions on securing your monitor.       Signed, Werner Lean, MD  07/18/2020 5:17 PM    [place]

## 2020-07-19 LAB — BASIC METABOLIC PANEL
BUN/Creatinine Ratio: 9 (ref 9–20)
BUN: 49 mg/dL — ABNORMAL HIGH (ref 6–20)
CO2: 16 mmol/L — ABNORMAL LOW (ref 20–29)
Calcium: 8.5 mg/dL — ABNORMAL LOW (ref 8.7–10.2)
Chloride: 109 mmol/L — ABNORMAL HIGH (ref 96–106)
Creatinine, Ser: 5.72 mg/dL — ABNORMAL HIGH (ref 0.76–1.27)
GFR calc Af Amer: 13 mL/min/{1.73_m2} — ABNORMAL LOW (ref >59)
GFR calc non Af Amer: 12 mL/min/{1.73_m2} — ABNORMAL LOW (ref >59)
Glucose: 114 mg/dL — ABNORMAL HIGH (ref 65–99)
Potassium: 3.9 mmol/L (ref 3.5–5.2)
Sodium: 142 mmol/L (ref 134–144)

## 2020-07-19 LAB — TSH: TSH: 4.01 u[IU]/mL (ref 0.450–4.500)

## 2020-07-19 NOTE — Therapy (Signed)
[facility] [address] [place], [place], [address] Phone: [phone]   Fax:  [phone]  Physical Therapy Treatment  Patient Details  Name: Adrian Evans MRN: [id number] Date of Birth: [birth year] Referring Provider (PT): Ricky Ala   Encounter Date: 07/18/2020   PT End of Session - 07/18/20 1320    Visit Number 3    Number of Visits 8    Date for PT Re-Evaluation 09/17/20    Authorization Type BCBS    PT Start Time 1315    PT Stop Time 1400    PT Time Calculation (min) 45 min    Equipment Utilized During Treatment Gait belt    Activity Tolerance Patient tolerated treatment well    Behavior During Therapy [facility] for tasks assessed/performed           Past Medical History:  Diagnosis Date  . Acute cerebral infarction ([place])   . Acute CHF (congestive heart failure) ([place]) 05/22/2018  . Acute encephalopathy 06/21/2020  . Acute renal failure (ARF) ([place]) 06/18/2020  . Chronic systolic congestive heart failure ([place])   . Diabetes mellitus type I ([place])   . Diabetes mellitus without complication ([place])   . Dysphagia, post-stroke   . Essential hypertension   . Iron deficiency anemia 04/16/2018  . Stage 3 chronic kidney disease [facility])     Past Surgical History:  Procedure Laterality Date  . EYE SURGERY    . RIGHT/LEFT HEART CATH AND CORONARY ANGIOGRAPHY N/A 05/25/2018   Procedure: RIGHT/LEFT HEART CATH AND CORONARY ANGIOGRAPHY;  Surgeon: Corey Skains, MD;  Location: [place] [facility];  Service: Cardiovascular;  Laterality: N/A;  . TONSILLECTOMY      There were no vitals filed for this visit.   Subjective Assessment - 07/18/20 1319    Subjective No c/o pain or med changes, no new MD visits, no concerns or problems following last session    Patient is accompained by: Family member    Pertinent History spouse Adrian Evans    Limitations Walking    How long can you sit comfortably? n/a    How long can you stand  comfortably? n/a    How long can you walk comfortably? 15 minutes    Currently in Pain? No/denies             07/18/20 0001  Balance Exercises: Standing  Step Ups Forward;Lateral;4 inch;Intermittent UE support;Limitations  Step Ups Limitations stepping up, over and back, 10 reps pre LE  Retro Gait Other reps (comment);Limitations;Theraband;5 reps  Theraband Level (Retro Gait) Level 3 (Green)  Retro Gait Limitations 10trips down counter tandem position  Sidestepping 5 reps;Theraband;Limitations  Theraband Level (Sidestepping) Level 3 (Green)  Sidestepping Limitations 5trips up/down counter        07/18/20 0001  Transfers  Transfers Sit to Stand  Sit to Stand 5: Supervision  Sit to Stand Details (indicate cue type and reason) 2x15 from airex  Knee/Hip Exercises: Aerobic  Other Aerobic Scifit L2 x10'                         PT Education - 07/18/20 0831    Education Details see above exercises    Person(s) Educated Spouse    Methods Demonstration;Handout    Comprehension Verbalized understanding;Returned demonstration            PT Short Term Goals - 07/12/20 1853      PT SHORT TERM GOAL #1   Title Demo I  in initial HEP    Baseline no HEP    Time 2    Period Weeks    Status New    Target Date 07/26/20      PT SHORT TERM GOAL #2   Title able to STS w/o need of UE support    Baseline needs single UE to transfer    Time 2    Period Weeks    Status New    Target Date 07/26/20      PT SHORT TERM GOAL #3   Title Ambulate with SPC vs. walking stick safely    Baseline S with walkng stick    Time 2    Period Weeks    Status New    Target Date 07/26/20             PT Long Term Goals - 07/16/20 1818      PT LONG TERM GOAL #1   Title patient to demo 4+/5 BLE strength    Baseline 4/5 BLE strength    Time 4    Period Weeks    Status New      PT LONG TERM GOAL #2   Title Patient to ambulate 1070f with LRAD and distant S    Baseline  in clinic distances with S and walking stick    Time 4    Period Weeks    Status New      PT LONG TERM GOAL #3   Title Assess FGA and set/achieve appropriate goal; 07/16/20 FGA goal 27    Baseline n/a; 07/16/20 FGA 23    Time 4    Period Weeks    Status New                 Plan - 07/19/20 [id number]   Clinical Impression Statement continued to advance gait and balance challenges w/o any c/o dizziness, only fatigue, mproving tandem skills noted b/t sessions, progessing quickly with strength and endurance deicits as main concern    Personal Factors and Comorbidities Comorbidity 1;Comorbidity 2    Comorbidities DM CHF    Stability/Clinical Decision Making Stable/Uncomplicated    Rehab Potential Good    PT Frequency 2x / week    PT Duration 4 weeks    PT Treatment/Interventions Aquatic Therapy;DME Instruction;Gait training;Stair training;Functional mobility training;Therapeutic activities;Therapeutic exercise;Balance training;Neuromuscular re-education;Patient/family education;Orthotic Fit/Training    PT Next Visit Plan continue high level balance training and LE strengthening and endurance training, add dual tasking to challenge cognitive skills    PT Home Exercise Plan see medbridge    Consulted and Agree with Plan of Care Patient;Family member/caregiver    Family Member Consulted dtr [name]          Patient will benefit from skilled therapeutic intervention in order to improve the following deficits and impairments:  Abnormal gait,Decreased balance,Decreased endurance,Decreased mobility,Difficulty walking,Decreased activity tolerance,Decreased coordination,Decreased safety awareness,Decreased strength  Visit Diagnosis: Unsteadiness on feet  Muscle weakness (generalized)     Problem List Patient Active Problem List   Diagnosis Date Noted  . HFrEF (heart failure with reduced ejection fraction) ([place] 07/18/2020  . Chronic kidney disease (CKD), stage IV (severe) ([place]  07/18/2020  . Palpitations 07/18/2020  . Acute cerebral infarction ([place]   . Diabetes mellitus type I ([place]   . Stage 3 chronic kidney disease ([place]   . Essential hypertension   . Chronic systolic congestive heart failure ([place]   . Dysphagia, post-stroke   . Acute encephalopathy 06/21/2020  . Acute renal  failure (ARF) ([place]) 06/18/2020  . Acute CHF (congestive heart failure) ([place]) 05/22/2018  . Iron deficiency anemia 04/16/2018    Lanice Shirts 07/19/2020, 8:37 AM  [facility] [address] [place], [place], [address] Phone: [phone]   Fax:  [phone]  Name: BRAYZEN WOJNAROWSKI MRN: [id number] Date of Birth: 1985-11-16

## 2020-07-22 ENCOUNTER — Other Ambulatory Visit: Payer: Self-pay

## 2020-07-22 ENCOUNTER — Ambulatory Visit: Payer: BC Managed Care – PPO

## 2020-07-22 ENCOUNTER — Encounter: Payer: Self-pay | Admitting: Occupational Therapy

## 2020-07-22 ENCOUNTER — Ambulatory Visit: Payer: BC Managed Care – PPO | Attending: Internal Medicine | Admitting: Occupational Therapy

## 2020-07-22 DIAGNOSIS — R2681 Unsteadiness on feet: Secondary | ICD-10-CM | POA: Diagnosis present

## 2020-07-22 DIAGNOSIS — R41841 Cognitive communication deficit: Secondary | ICD-10-CM | POA: Insufficient documentation

## 2020-07-22 DIAGNOSIS — R41844 Frontal lobe and executive function deficit: Secondary | ICD-10-CM | POA: Diagnosis present

## 2020-07-22 DIAGNOSIS — M6281 Muscle weakness (generalized): Secondary | ICD-10-CM | POA: Diagnosis present

## 2020-07-22 NOTE — Therapy (Signed)
[facility] [address] [place], [place], [address] Phone: [phone]   Fax:  [phone]  Physical Therapy Treatment  Patient Details  Name: Adrian Evans MRN: [id number] Date of Birth: Dec 26, 1985 Referring Provider (PT): Ricky Ala   Encounter Date: 07/22/2020   PT End of Session - 07/22/20 1552    Visit Number 4    Number of Visits 8    Date for PT Re-Evaluation 09/17/20    Authorization Type BCBS    PT Start Time 1500    PT Stop Time 1545    PT Time Calculation (min) 45 min    Equipment Utilized During Treatment Gait belt    Activity Tolerance Patient tolerated treatment well    Behavior During Therapy [facility] for tasks assessed/performed           Past Medical History:  Diagnosis Date  . Acute cerebral infarction ([place])   . Acute CHF (congestive heart failure) ([place]) 05/22/2018  . Acute encephalopathy 06/21/2020  . Acute renal failure (ARF) ([place]) 06/18/2020  . Chronic systolic congestive heart failure ([place])   . Diabetes mellitus type I ([place])   . Diabetes mellitus without complication ([place])   . Dysphagia, post-stroke   . Essential hypertension   . Iron deficiency anemia 04/16/2018  . Stage 3 chronic kidney disease [facility])     Past Surgical History:  Procedure Laterality Date  . EYE SURGERY    . RIGHT/LEFT HEART CATH AND CORONARY ANGIOGRAPHY N/A 05/25/2018   Procedure: RIGHT/LEFT HEART CATH AND CORONARY ANGIOGRAPHY;  Surgeon: Corey Skains, MD;  Location: [facility];  Service: Cardiovascular;  Laterality: N/A;  . TONSILLECTOMY      There were no vitals filed for this visit.   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 n/a    How long can you stand comfortably? n/a     How long can you walk comfortably? 15 minutes    Currently in Pain? No/denies                             [facility] Adult PT Treatment/Exercise - 07/22/20 0001      Ambulation/Gait   Ambulation/Gait Yes    Ambulation/Gait Assistance 5: Supervision    Ambulation Distance (Feet) 1000 Feet    Assistive device None    Gait Pattern Step-through pattern    Ambulation Surface Level;Unlevel;Indoor;Outdoor;Grass    Gait Comments sidestepping and retrowalking on grass w/o LOB as well as jumping over cracks in sidewalk      Knee/Hip Exercises: Aerobic   Other Aerobic Scifit L3 x10   instructed to maintain RPM b/t 60-70              Balance Exercises - 07/22/20 0001      Balance Exercises: Standing   Rockerboard Anterior/posterior;Lateral;Limitations    Rockerboard Limitations performed squats with intermittent UE support 15x ea. position of board    Tandem Gait Forward;Retro;Intermittent upper extremity support;Foam/compliant surface;5 reps;Limitations    Tandem Gait Limitations performd in // bars across blue foam beam    Sidestepping Foam/compliant support;5 reps;Limitations    Sidestepping Limitations perfomed 5 trips in // bars across blue foam  PT Short Term Goals - 07/22/20 1509      PT SHORT TERM GOAL #1   Title Demo I in initial HEP; 07/22/20 able to demo HEP back to PT w/o cuing    Baseline no HEP    Time 2    Period Weeks    Status Achieved    Target Date 07/26/20      PT SHORT TERM GOAL #2   Title able to STS w/o need of UE support    Baseline needs single UE to transfer; able to stand I w/o UE support    Time 2    Period Weeks    Status Achieved    Target Date 07/26/20      PT SHORT TERM GOAL #3   Title Ambulate with SPC vs. walking stick safely    Baseline S with walkng stick; ambulating community distances w/o AD    Time 2    Period Weeks    Status Achieved    Target Date 07/26/20             PT Long Term Goals -  07/16/20 1818      PT LONG TERM GOAL #1   Title patient to demo 4+/5 BLE strength    Baseline 4/5 BLE strength    Time 4    Period Weeks    Status New      PT LONG TERM GOAL #2   Title Patient to ambulate 1054f with LRAD and distant S    Baseline in clinic distances with S and walking stick    Time 4    Period Weeks    Status New      PT LONG TERM GOAL #3   Title Assess FGA and set/achieve appropriate goal; 07/16/20 FGA goal 27    Baseline n/a; 07/16/20 FGA 23    Time 4    Period Weeks    Status New                 Plan - 07/22/20 1553    Clinical Impression Statement assessd outside ambulation as well as ability to jump over sidewalk cracks, tandem and retro-tamdem walking across blue foam beam and squatting on rocker board.  No LOB noted, initially apprehensive about jumping but was coached through mechanics/technique, all STGs met.  assess LTGs, any return to work requirements from PT and ST assessment results    Personal Factors and Comorbidities Comorbidity 1;Comorbidity 2    Comorbidities DM CHF    Stability/Clinical Decision Making Stable/Uncomplicated    Rehab Potential Good    PT Frequency 2x / week    PT Duration 4 weeks    PT Treatment/Interventions Aquatic Therapy;DME Instruction;Gait training;Stair training;Functional mobility training;Therapeutic activities;Therapeutic exercise;Balance training;Neuromuscular re-education;Patient/family education;Orthotic Fit/Training    PT Next Visit Plan continue high level balance training and LE strengthening and endurance training, add dual tasking to challenge cognitive skills    PT Home Exercise Plan see medbridge    Consulted and Agree with Plan of Care Patient;Family member/caregiver    Family Member Consulted dtr [name]          Patient will benefit from skilled therapeutic intervention in order to improve the following deficits and impairments:  Abnormal gait,Decreased balance,Decreased endurance,Decreased  mobility,Difficulty walking,Decreased activity tolerance,Decreased coordination,Decreased safety awareness,Decreased strength  Visit Diagnosis: Unsteadiness on feet     Problem List Patient Active Problem List   Diagnosis Date Noted  . HFrEF (heart failure with reduced ejection fraction) ([place] 07/18/2020  .  Chronic kidney disease (CKD), stage IV (severe) ([place]) 07/18/2020  . Palpitations 07/18/2020  . Acute cerebral infarction ([place])   . Diabetes mellitus type I ([place])   . Stage 3 chronic kidney disease ([place])   . Essential hypertension   . Chronic systolic congestive heart failure ([place])   . Dysphagia, post-stroke   . Acute encephalopathy 06/21/2020  . Acute renal failure (ARF) ([place]) 06/18/2020  . Acute CHF (congestive heart failure) ([place]) 05/22/2018  . Iron deficiency anemia 04/16/2018    Lanice Shirts PT 07/22/2020, 3:58 PM  [facility] [address] [place], [place], [address] Phone: [phone]   Fax:  [phone]  Name: WISE FEES MRN: [id number] Date of Birth: August 25, 1985

## 2020-07-22 NOTE — Patient Instructions (Signed)
If possible return to work at a reduced schedule or take frequent rest breaks   You may want to take a 15-30 mins rest break away from technology mid day  Double check yourself and set up a system of checks and balances so you do not foget or miss information  Ask superviior for feedback regularly to make sure you are doing ok  If you want to challenge your brain there is a computer program called constant therapy that has brain games

## 2020-07-23 NOTE — Therapy (Signed)
[facility] [address] [place], [place], [address] Phone: [phone]   Fax:  [phone]  Occupational Therapy Evaluation  Patient Details  Name: Adrian Evans MRN: [id number] Date of Birth: [birth year] Referring Provider (OT): Dr. Nigel Bridgeman   Encounter Date: 07/22/2020   OT End of Session - 07/23/20 0918    Visit Number 1    Number of Visits 1    Authorization Type Blue cross Blue shield    OT Start Time 1405    OT Stop Time 1445    OT Time Calculation (min) 40 min    Activity Tolerance Patient tolerated treatment well    Behavior During Therapy [facility] for tasks assessed/performed           Past Medical History:  Diagnosis Date  . Acute cerebral infarction ([place])   . Acute CHF (congestive heart failure) ([place]) 05/22/2018  . Acute encephalopathy 06/21/2020  . Acute renal failure (ARF) ([place]) 06/18/2020  . Chronic systolic congestive heart failure ([place])   . Diabetes mellitus type I ([place])   . Diabetes mellitus without complication ([place])   . Dysphagia, post-stroke   . Essential hypertension   . Iron deficiency anemia 04/16/2018  . Stage 3 chronic kidney disease [facility])     Past Surgical History:  Procedure Laterality Date  . EYE SURGERY    . RIGHT/LEFT HEART CATH AND CORONARY ANGIOGRAPHY N/A 05/25/2018   Procedure: RIGHT/LEFT HEART CATH AND CORONARY ANGIOGRAPHY;  Surgeon: Corey Skains, MD;  Location: [facility];  Service: Cardiovascular;  Laterality: N/A;  . TONSILLECTOMY      There were no vitals filed for this visit.   Subjective Assessment - 07/22/20 1408    Subjective  Pt reports he thinks he is back to baseline    Patient Stated Goals unsure of goals    Currently in Pain? No/denies             [facility] OT Assessment - 07/22/20 1410      Assessment   Medical Diagnosis encephalopathy    Referring Provider (OT) Dr. Nigel Bridgeman    Onset Date/Surgical Date 06/18/20    Prior Therapy acute       Precautions   Precautions Fall      Prior Function   Level of Independence Independent    Vocation Full time employment    Museum/gallery curator      ADL   ADL comments modified I with all basic ADLS      IADL   Shopping Needs to be accompanied on any shopping trip    Light Housekeeping Performs light daily tasks such as dishwashing, bed making    Meal Prep Able to complete simple warm meal prep    Medication Management Takes responsibility if medication is prepared in advance in seperate dosage      Mobility   Mobility Status Independent      Written Expression   Dominant Hand Right      Vision Assessment   Vision Assessment Vision tested   Denies visual changes     Cognition   Overall Cognitive Status Within Functional Limits for tasks assessed    Memory Appears intact    Awareness Impaired    Awareness Impairment Intellectual impairment   Denies mild cognitve deficits, decreased processing speed   Behaviors --   Denies cognitive changes   Cognition Comments spells world backwards, recalls 3/3 words following a delay. Completes trailamaking A and B correctly. 1 error counting  ackwards by 7's,   Pt had mild difficulty with clock drawing task and min v.c for category generation,Pt demonstrates decreased processing seed. Pt.  feels he is at premorbid level of function.     Coordination   Gross Motor Movements are Fluid and Coordinated Yes    Fine Motor Movements are Fluid and Coordinated Yes   per gross assessment     ROM / Strength   AROM / PROM / Strength AROM;Strength      AROM   Overall AROM  Within functional limits for tasks performed      Strength   Overall Strength Within functional limits for tasks performed                                       Plan - 07/23/20 0919    Clinical Impression Statement 35 y.o. male presenting to [facility] on 1/26 with n/v, malaise, weakness, and poor PO intake. Noted worsening AMS with unclear  etiology. Patient admitted with acute encephalopathy 2/2 severe metabolic acidosis, acute renal failure on CKD IIIa now on HD, acute hypoxic respiratory failure, acute on chronic CHF, and possible seizures. Liver mass in R hepatic lobe noted on renal ultrasound with plan for outpatient follow-up. Transfer to [facility] 1/29. PMHx significant for HTN, DMI, & CKD III.  Pt was seen for occupational therapy evaluation. Pt demonstrates only mild cognitive changes with mild slowing of processing speed. Pt reports he feels he is back to baseline and he does not feel OT is necessary at this time. Pt plans to retrun to work from home next week. Therappist provided recommendations for return to work to patient and wife.    OT Occupational Profile and History Problem Focused Assessment - Including review of records relating to presenting problem    Occupational performance deficits (Please refer to evaluation for details): ADL's;IADL's;Work;Social Participation    Cognitive Skills Problem Solve;Thought    Rehab Potential Good    Clinical Decision Making Limited treatment options, no task modification necessary    Comorbidities Affecting Occupational Performance: May have comorbidities impacting occupational performance    Modification or Assistance to Complete Evaluation  No modification of tasks or assist necessary to complete eval    OT Frequency One time visit    OT Duration 4 weeks    OT Treatment/Interventions Self-care/ADL training;Patient/family education;Cognitive remediation/compensation    Plan no additional OT visits scheduled at this time, an no goals set due to evalutaion only    Consulted and Agree with Plan of Care Patient;Family member/caregiver           Patient will benefit from skilled therapeutic intervention in order to improve the following deficits and impairments:     Cognitive Skills: Problem Solve,Thought     Visit Diagnosis: Frontal lobe and executive function deficit - Plan: Ot plan  of care cert/re-cert    Problem List Patient Active Problem List   Diagnosis Date Noted  . HFrEF (heart failure with reduced ejection fraction) ([place]) 07/18/2020  . Chronic kidney disease (CKD), stage IV (severe) ([place]) 07/18/2020  . Palpitations 07/18/2020  . Acute cerebral infarction ([place])   . Diabetes mellitus type I ([place])   . Stage 3 chronic kidney disease ([place])   . Essential hypertension   . Chronic systolic congestive heart failure ([place])   . Dysphagia, post-stroke   . Acute encephalopathy 06/21/2020  . Acute renal failure (ARF) ([place]) 06/18/2020  .  Acute CHF (congestive heart failure) ([place]) 05/22/2018  . Iron deficiency anemia 04/16/2018    RINE,KATHRYN 07/23/2020, 1:03 PM Theone Murdoch, OTR/L [phone] Phone: [phone] 1:03 PM 07/23/20 [facility] [address] [place], [place], [address] Phone: [phone]   Fax:  [phone]  Name: Adrian Evans MRN: [id number] Date of Birth: May 10, 1986

## 2020-07-25 ENCOUNTER — Ambulatory Visit: Payer: BC Managed Care – PPO

## 2020-07-25 ENCOUNTER — Encounter: Payer: Self-pay | Admitting: Physical Therapy

## 2020-07-25 ENCOUNTER — Other Ambulatory Visit: Payer: Self-pay

## 2020-07-25 ENCOUNTER — Ambulatory Visit: Payer: BC Managed Care – PPO | Admitting: Physical Therapy

## 2020-07-25 DIAGNOSIS — R41841 Cognitive communication deficit: Secondary | ICD-10-CM

## 2020-07-25 DIAGNOSIS — M6281 Muscle weakness (generalized): Secondary | ICD-10-CM

## 2020-07-25 DIAGNOSIS — R2681 Unsteadiness on feet: Secondary | ICD-10-CM

## 2020-07-25 DIAGNOSIS — R41844 Frontal lobe and executive function deficit: Secondary | ICD-10-CM | POA: Diagnosis not present

## 2020-07-25 NOTE — Therapy (Signed)
[facility] [address] [place], [place], [address] Phone: [phone]   Fax:  [phone]  Speech Language Pathology Evaluation  Patient Details  Name: Adrian Evans MRN: [id number] Date of Birth: 1986/03/01 Referring Provider (SLP): Barb Merino MD (hospitalist) (PCP: Belinda Fisher MD- doc)   Encounter Date: 07/25/2020   End of Session - 07/25/20 1216    Visit Number 1    Number of Visits 1    SLP Start Time 1140    SLP Stop Time  [id number]    SLP Time Calculation (min) 34 min    Activity Tolerance Patient tolerated treatment well           Past Medical History:  Diagnosis Date  . Acute cerebral infarction ([place])   . Acute CHF (congestive heart failure) ([place]) 05/22/2018  . Acute encephalopathy 06/21/2020  . Acute renal failure (ARF) ([place]) 06/18/2020  . Chronic systolic congestive heart failure ([place])   . Diabetes mellitus type I ([place])   . Diabetes mellitus without complication ([place])   . Dysphagia, post-stroke   . Essential hypertension   . Iron deficiency anemia 04/16/2018  . Stage 3 chronic kidney disease [facility])     Past Surgical History:  Procedure Laterality Date  . EYE SURGERY    . RIGHT/LEFT HEART CATH AND CORONARY ANGIOGRAPHY N/A 05/25/2018   Procedure: RIGHT/LEFT HEART CATH AND CORONARY ANGIOGRAPHY;  Surgeon: Corey Skains, MD;  Location: [facility];  Service: Cardiovascular;  Laterality: N/A;  . TONSILLECTOMY      There were no vitals filed for this visit.       SLP Evaluation [facility] - 07/25/20 1128      SLP Visit Information   SLP Received On 07/01/20    Referring Provider (SLP) Barb Merino MD (hospitalist)   PCP: Belinda Fisher MD- doc   Onset Date 06-18-20    Medical Diagnosis Acute CVA      Subjective   Subjective Pt stated "I'm back to normal"   wife confirmed return to baseline   Patient/Family Stated Goal "all I need to do is get strength back in my legs"    currently working with PT     Pain Assessment   Currently in Pain? No/denies      General Information   HPI Pt is 35 yo M admitted to [facility] 1/26 w n/v malaise, weakness. Hypotensive in ED and volume resuscitated. Worse AMS without clear etiology, transferring to [facility] 1/29 for LP and cEEG. MRI revealed multiple scattered punctate acute ishcemic infarcts involving both cerebral hemisphers, R greater than L and a small remote L PCA territory infarct, with additional small remote L thalamic and R cerebellar infarct. Pt discharged from hospital on 2/11.    Behavioral/Cognition WNL    Mobility Status walks independently      Balance Screen   Has the patient fallen in the past 6 months No      Prior Functional Status   Cognitive/Linguistic Baseline Within functional limits    Type of Home House     Lives With Spouse    Available Support Family;Friend(s)    Education Masters    Vocation Full time employment      Cognition   Overall Cognitive Status Within Functional Limits for tasks assessed   pt and wife report return to baseline     Auditory Comprehension   Overall Auditory Comprehension Appears within functional limits for tasks assessed      Expression  Primary Mode of Expression Verbal      Verbal Expression   Overall Verbal Expression Appears within functional limits for tasks assessed      Oral Motor/Sensory Function   Overall Oral Motor/Sensory Function Appears within functional limits for tasks assessed      Motor Speech   Overall Motor Speech Appears within functional limits for tasks assessed                           SLP Education - 07/25/20 1222    Education Details recommendations for returning to work, request referral as needed if cognitive linguistic deficits noted    Person(s) Educated Patient;Spouse    Methods Explanation;Demonstration    Comprehension Verbalized understanding;Returned demonstration                Plan - 07/25/20 1223     Clinical Impression Statement Adrian Evans presents for OPST evaluation following CVA in January 2021. Adrian Evans and his wife report patient has completely returned to baseline and functioning at prior level without errors or concern. Pt polietly inquired why he was having ST evaluation as pt is reportedly no longer having any difficulty with swallowing or cognition as addressed during hosptialization. Pt and wife denied any difficulty with swallowing, speech/language, or cognition at this time. Pt recalled extensive details from PT sessions and OT evaluation earlier in the week, including tested words and targeted activities. Pt elected to defer formal cognitive linguistic testing during ST evaluation as pt stated "I already did that with OT. I did just fine." OT evaluation only completed earlier this week as pt endorsed he did not require OT services at this time. SLP more informally assessed orientation, attention, delayed recall, and mental manipulation subtests on SLUMS/MOCA, which were all WNL.  Adrian Evans reports his wife manages the bills and medications as before the stroke, with no desire to complete independently. No safety conerns related to daily routine reported by Adrian Evans or wife. No overt difficulty with recall/sequencing for balancing busy family life/work activities. Pt declined need for ST intervention at this time and elected for evaluation only; therefore, ST evaluation only completed. SLP educated patient and wife that if any cognitive linguistic deficits noted as time progresses, pt may request referral and return to OPST for evaluation as needed.           Patient will benefit from skilled therapeutic intervention in order to improve the following deficits and impairments:   Cognitive communication deficit    Problem List Patient Active Problem List   Diagnosis Date Noted  . HFrEF (heart failure with reduced ejection fraction) ([place]) 07/18/2020  . Chronic kidney disease (CKD), stage IV  (severe) ([place]) 07/18/2020  . Palpitations 07/18/2020  . Acute cerebral infarction ([place])   . Diabetes mellitus type I ([place])   . Stage 3 chronic kidney disease ([place])   . Essential hypertension   . Chronic systolic congestive heart failure ([place])   . Dysphagia, post-stroke   . Acute encephalopathy 06/21/2020  . Acute renal failure (ARF) ([place]) 06/18/2020  . Acute CHF (congestive heart failure) ([place]) 05/22/2018  . Iron deficiency anemia 04/16/2018    Alinda Deem, MA CCC-SLP 07/25/2020, 12:30 PM  [facility] [address] [place], [place], [address] Phone: [phone]   Fax:  [phone]  Name: Adrian Evans MRN: [id number] Date of Birth: January 25, 1986

## 2020-07-25 NOTE — Therapy (Signed)
[facility] [address] [place], [place], [address] Phone: [phone]   Fax:  [phone]  Physical Therapy Treatment  Patient Details  Name: Adrian Evans MRN: [id number] Date of Birth: 1985-09-19 Referring Provider (PT): Ricky Ala   Encounter Date: 07/25/2020   PT End of Session - 07/25/20 1103    Visit Number 5    Number of Visits 8    Date for PT Re-Evaluation 09/17/20    Authorization Type BCBS    PT Start Time 1102    PT Stop Time 1143    PT Time Calculation (min) 41 min    Equipment Utilized During Treatment Gait belt    Activity Tolerance Patient tolerated treatment well    Behavior During Therapy [facility] for tasks assessed/performed           Past Medical History:  Diagnosis Date  . Acute cerebral infarction ([place])   . Acute CHF (congestive heart failure) ([place]) 05/22/2018  . Acute encephalopathy 06/21/2020  . Acute renal failure (ARF) ([place]) 06/18/2020  . Chronic systolic congestive heart failure ([place])   . Diabetes mellitus type I ([place])   . Diabetes mellitus without complication ([place])   . Dysphagia, post-stroke   . Essential hypertension   . Iron deficiency anemia 04/16/2018  . Stage 3 chronic kidney disease [facility])     Past Surgical History:  Procedure Laterality Date  . EYE SURGERY    . RIGHT/LEFT HEART CATH AND CORONARY ANGIOGRAPHY N/A 05/25/2018   Procedure: RIGHT/LEFT HEART CATH AND CORONARY ANGIOGRAPHY;  Surgeon: Corey Skains, MD;  Location: [place] CV LAB;  Service: Cardiovascular;  Laterality: N/A;  . TONSILLECTOMY      There were no vitals filed for this visit.   Subjective Assessment - 07/25/20 1102    Subjective No new complaints. No falls or pain to report. HEP is getting easy.    Patient is accompained by: Family member    Pertinent History spouse [name]    Limitations Walking    How long can you sit comfortably? n/a    How long can you walk comfortably? 15 minutes     Currently in Pain? No/denies                 [facility] Adult PT Treatment/Exercise - 07/25/20 1103      Transfers   Transfers Sit to Stand;Stand to Sit    Sit to Stand 6: Modified independent (Device/Increase time)    Stand to Sit 6: Modified independent (Device/Increase time)      Ambulation/Gait   Ambulation/Gait Yes    Ambulation/Gait Assistance 5: Supervision    Ambulation/Gait Assistance Details pt noted to have increased right foot slap as he fatigued. Improved with rest breaks    Ambulation Distance (Feet) 1500 Feet   x1 outdoor/indoor combined, plus around gym with session   Assistive device None    Gait Pattern Step-through pattern    Ambulation Surface Level;Unlevel;Indoor;Outdoor;Paved      High Level Balance   High Level Balance Activities Tandem walking;Marching forwards;Marching backwards;Other (comment)   grapevine left<>right; toe walking, heel walking and tandem walking all forward/backwars   High Level Balance Comments on red/blue mats next to counter: intermittent touch for balance with pt performing each one for 3 reps each way. min guard to min assist.      Neuro Re-ed    Neuro Re-ed Details  for balance/muscle re-ed: gait indoors/outdoors while self tossing hacky sack and naming foods A-Z, then animals  A-Z, supervision with leading cues needed ~50% for names               Balance Exercises - 07/25/20 1122      Balance Exercises: Standing   Rockerboard Anterior/posterior;Lateral;EO;EC;30 seconds;Other reps (comment);Limitations    Rockerboard Limitations perrormed both ways on balance board with no UE support: rocking the board with emphasis on tall posture with EO, progressing to EC. increased assistance needed for balance with vision removed. Then had pt holding the board steady for EC 30 sec's x 3 reps. min guard to min assist with cues on posture to assist with balance.    Step Over Hurdles / Cones hurdles of varied heights on red/blue mats: forward  reciprocal stepping x 6 laps. then lateral stepping x 2 laps each way. right LE noted to fatigue with increased foot slap with gait noted afterwards . Min guard to min assist for balance with touch to counter at times for balance.               PT Short Term Goals - 07/22/20 1509      PT SHORT TERM GOAL #1   Title Demo I in initial HEP; 07/22/20 able to demo HEP back to PT w/o cuing    Baseline no HEP    Time 2    Period Weeks    Status Achieved    Target Date 07/26/20      PT SHORT TERM GOAL #2   Title able to STS w/o need of UE support    Baseline needs single UE to transfer; able to stand I w/o UE support    Time 2    Period Weeks    Status Achieved    Target Date 07/26/20      PT SHORT TERM GOAL #3   Title Ambulate with SPC vs. walking stick safely    Baseline S with walkng stick; ambulating community distances w/o AD    Time 2    Period Weeks    Status Achieved    Target Date 07/26/20             PT Long Term Goals - 07/16/20 1818      PT LONG TERM GOAL #1   Title patient to demo 4+/5 BLE strength    Baseline 4/5 BLE strength    Time 4    Period Weeks    Status New      PT LONG TERM GOAL #2   Title Patient to ambulate 1064f with LRAD and distant S    Baseline in clinic distances with S and walking stick    Time 4    Period Weeks    Status New      PT LONG TERM GOAL #3   Title Assess FGA and set/achieve appropriate goal; 07/16/20 FGA goal 27    Baseline n/a; 07/16/20 FGA 23    Time 4    Period Weeks    Status New                 Plan - 07/25/20 1103    Clinical Impression Statement Today's skilled session continued to focus on high level balance training and dual/multi-tasking. Pt's right LE noted to fatigue with increased activity with increased foot slap noted. This improved with rest. Pt also noted to continue to need increased assistance with vision removed tasks. The pt is making steady progress toward goals with increased activity  tolerance and less overall assistance needed with dynamic gait/multi-tasking activites.  The pt also demo's word finding difficulty and needed increased time for proccessing multi step directions during session. Has ST eval next. The pt should benefit from continued PT to progress toward unmet goals.    Personal Factors and Comorbidities Comorbidity 1;Comorbidity 2    Comorbidities DM CHF    Stability/Clinical Decision Making Stable/Uncomplicated    Rehab Potential Good    PT Frequency 2x / week    PT Duration 4 weeks    PT Treatment/Interventions Aquatic Therapy;DME Instruction;Gait training;Stair training;Functional mobility training;Therapeutic activities;Therapeutic exercise;Balance training;Neuromuscular re-education;Patient/family education;Orthotic Fit/Training    PT Next Visit Plan continue high level balance training and LE strengthening and endurance training, add dual tasking to challenge cognitive skills    PT Home Exercise Plan see medbridge    Consulted and Agree with Plan of Care Patient;Family member/caregiver    Family Member Consulted dtr Adrian Evans           Patient will benefit from skilled therapeutic intervention in order to improve the following deficits and impairments:  Abnormal gait,Decreased balance,Decreased endurance,Decreased mobility,Difficulty walking,Decreased activity tolerance,Decreased coordination,Decreased safety awareness,Decreased strength  Visit Diagnosis: Unsteadiness on feet  Muscle weakness (generalized)     Problem List Patient Active Problem List   Diagnosis Date Noted  . HFrEF (heart failure with reduced ejection fraction) ([place]) 07/18/2020  . Chronic kidney disease (CKD), stage IV (severe) ([place]) 07/18/2020  . Palpitations 07/18/2020  . Acute cerebral infarction ([place])   . Diabetes mellitus type I ([place])   . Stage 3 chronic kidney disease ([place])   . Essential hypertension   . Chronic systolic congestive heart failure ([place])   . Dysphagia,  post-stroke   . Acute encephalopathy 06/21/2020  . Acute renal failure (ARF) ([place]) 06/18/2020  . Acute CHF (congestive heart failure) ([place]) 05/22/2018  . Iron deficiency anemia 04/16/2018    Willow Ora, PTA, [facility] Outpatient Neuro [facility] [address], [place], [place] [address] [phone] 07/25/20, 11:52 AM   Name: Adrian Evans MRN: [id number] Date of Birth: June 09, 1985

## 2020-07-28 ENCOUNTER — Ambulatory Visit: Payer: BC Managed Care – PPO

## 2020-08-01 ENCOUNTER — Other Ambulatory Visit: Payer: Self-pay

## 2020-08-01 ENCOUNTER — Ambulatory Visit: Payer: BC Managed Care – PPO

## 2020-08-01 ENCOUNTER — Ambulatory Visit: Payer: BC Managed Care – PPO | Admitting: Physical Therapy

## 2020-08-01 DIAGNOSIS — R2681 Unsteadiness on feet: Secondary | ICD-10-CM

## 2020-08-01 DIAGNOSIS — M6281 Muscle weakness (generalized): Secondary | ICD-10-CM

## 2020-08-02 NOTE — Therapy (Signed)
[facility] [address] [place], [place], [address] Phone: [phone]   Fax:  [phone]  Physical Therapy Treatment  Patient Details  Name: Adrian Evans MRN: [id number] Date of Birth: 01/30/1986 Referring Provider (PT): Ricky Ala   Encounter Date: 08/01/2020   PT End of Session - 08/02/20 1655    Visit Number 6    Number of Visits 8    Date for PT Re-Evaluation 09/17/20    Authorization Type BCBS    PT Start Time 1530    PT Stop Time 1615    PT Time Calculation (min) 45 min    Equipment Utilized During Treatment Gait belt    Activity Tolerance Patient tolerated treatment well    Behavior During Therapy [facility] for tasks assessed/performed           Past Medical History:  Diagnosis Date  . Acute cerebral infarction ([place])   . Acute CHF (congestive heart failure) ([place]) 05/22/2018  . Acute encephalopathy 06/21/2020  . Acute renal failure (ARF) ([place]) 06/18/2020  . Chronic systolic congestive heart failure ([place])   . Diabetes mellitus type I ([place])   . Diabetes mellitus without complication ([place])   . Dysphagia, post-stroke   . Essential hypertension   . Iron deficiency anemia 04/16/2018  . Stage 3 chronic kidney disease [facility])     Past Surgical History:  Procedure Laterality Date  . EYE SURGERY    . RIGHT/LEFT HEART CATH AND CORONARY ANGIOGRAPHY N/A 05/25/2018   Procedure: RIGHT/LEFT HEART CATH AND CORONARY ANGIOGRAPHY;  Surgeon: Adrian Skains, MD;  Location: [place] CV LAB;  Service: Cardiovascular;  Laterality: N/A;  . TONSILLECTOMY      There were no vitals filed for this visit.   Subjective Assessment - 08/01/20 1537    Subjective No falls or med changes, rates himself at 70%, main issue is stair negotiation due to fatigue, has returned to driving    Patient is accompained by: Family member    Pertinent History spouse Adrian Evans    Limitations Walking    How long can you sit comfortably? n/a     How long can you stand comfortably? n/a    How long can you walk comfortably? 15 minutes    Currently in Pain? No/denies                08/01/20 0001  Ambulation/Gait  Ambulation/Gait Yes  Ambulation/Gait Assistance 5: Supervision  Ambulation Distance (Feet) 345 Feet  Assistive device None  Gait Pattern Step-through pattern  Ambulation Surface Level;Indoor  Gait Comments had patient self toss/catch non-weighted ball  High Level Balance  High Level Balance Activities Braiding  High Level Balance Comments performed in // bars 5 trips  Knee/Hip Exercises: Aerobic  Elliptical 4/4' fwd/bwd L2       08/01/20 0001  Balance Exercises: Standing  SLS Eyes open;Foam/compliant surface;Limitations  SLS Limitations SLS on Airex with ball toss/catch  Rockerboard Anterior/posterior;Lateral;Limitations  Rockerboard Limitations performed squats in ea. position of board  Tandem Gait Forward;Retro;Intermittent upper extremity support;Foam/compliant surface;5 reps;Limitations  Tandem Gait Limitations performed in // bars across blue foam beam  Sidestepping Foam/compliant support;Upper extremity support;5 reps;Limitations  Sidestepping Limitations performed in // bars across blue foam beam  Other Standing Exercises lunges in // bars onto cushioned 4" block, 15x per LE                          PT Short Term Goals -  07/22/20 1509      PT SHORT TERM GOAL #1   Title Demo I in initial HEP; 07/22/20 able to demo HEP back to PT w/o cuing    Baseline no HEP    Time 2    Period Weeks    Status Achieved    Target Date 07/26/20      PT SHORT TERM GOAL #2   Title able to STS w/o need of UE support    Baseline needs single UE to transfer; able to stand I w/o UE support    Time 2    Period Weeks    Status Achieved    Target Date 07/26/20      PT SHORT TERM GOAL #3   Title Ambulate with SPC vs. walking stick safely    Baseline S with walkng stick; ambulating community  distances w/o AD    Time 2    Period Weeks    Status Achieved    Target Date 07/26/20             PT Long Term Goals - 07/16/20 1818      PT LONG TERM GOAL #1   Title patient to demo 4+/5 BLE strength    Baseline 4/5 BLE strength    Time 4    Period Weeks    Status New      PT LONG TERM GOAL #2   Title Patient to ambulate 1071f with LRAD and distant S    Baseline in clinic distances with S and walking stick    Time 4    Period Weeks    Status New      PT LONG TERM GOAL #3   Title Assess FGA and set/achieve appropriate goal; 07/16/20 FGA goal 27    Baseline n/a; 07/16/20 FGA 23    Time 4    Period Weeks    Status New                 Plan - 08/02/20 1655    Clinical Impression Statement Skilled intervention included strength and balance training with focus on squatting to improve stair negotiation ability, added elliptical to session and performed functional task on non-compliant surfaces    Personal Factors and Comorbidities Comorbidity 1;Comorbidity 2    Comorbidities DM CHF    Stability/Clinical Decision Making Stable/Uncomplicated    Rehab Potential Good    PT Frequency 2x / week    PT Duration 4 weeks    PT Treatment/Interventions Aquatic Therapy;DME Instruction;Gait training;Stair training;Functional mobility training;Therapeutic activities;Therapeutic exercise;Balance training;Neuromuscular re-education;Patient/family education;Orthotic Fit/Training    PT Next Visit Plan continue strength and balance ativities with emphasis on squatting and stair climbing tasks, dual task activities    PT Home Exercise Plan see medbridge    Consulted and Agree with Plan of Care Patient;Family member/caregiver    Family Member Consulted dtr [name]          Patient will benefit from skilled therapeutic intervention in order to improve the following deficits and impairments:  Abnormal gait,Decreased balance,Decreased endurance,Decreased mobility,Difficulty  walking,Decreased activity tolerance,Decreased coordination,Decreased safety awareness,Decreased strength  Visit Diagnosis: Unsteadiness on feet  Muscle weakness (generalized)     Problem List Patient Active Problem List   Diagnosis Date Noted  . HFrEF (heart failure with reduced ejection fraction) ([place] 07/18/2020  . Chronic kidney disease (CKD), stage IV (severe) ([place] 07/18/2020  . Palpitations 07/18/2020  . Acute cerebral infarction ([place]   . Diabetes mellitus type I ([place]   .  Stage 3 chronic kidney disease ([place])   . Essential hypertension   . Chronic systolic congestive heart failure ([place])   . Dysphagia, post-stroke   . Acute encephalopathy 06/21/2020  . Acute renal failure (ARF) ([place]) 06/18/2020  . Acute CHF (congestive heart failure) ([place]) 05/22/2018  . Iron deficiency anemia 04/16/2018    Lanice Shirts 08/02/2020, 5:09 PM  [facility] [address] [place], [place], [address] Phone: [phone]   Fax:  [phone]  Name: Adrian Evans MRN: [id number] Date of Birth: February 07, 1986

## 2020-08-04 ENCOUNTER — Ambulatory Visit: Payer: BC Managed Care – PPO

## 2020-08-08 ENCOUNTER — Other Ambulatory Visit: Payer: Self-pay

## 2020-08-08 ENCOUNTER — Ambulatory Visit: Payer: BC Managed Care – PPO

## 2020-08-08 DIAGNOSIS — M6281 Muscle weakness (generalized): Secondary | ICD-10-CM

## 2020-08-08 DIAGNOSIS — R41844 Frontal lobe and executive function deficit: Secondary | ICD-10-CM | POA: Diagnosis not present

## 2020-08-08 DIAGNOSIS — R2681 Unsteadiness on feet: Secondary | ICD-10-CM

## 2020-08-09 NOTE — Patient Instructions (Signed)
Verbal review of areas and machines to focus on at [facility]

## 2020-08-09 NOTE — Therapy (Signed)
[facility] [address] [place], [place], [address] Phone: [phone]   Fax:  [phone]  Physical Therapy Treatment/DC Summary  Patient Details  Name: DELVON CHIPPS MRN: [id number] Date of Birth: [birth year] Referring Provider (PT): Ricky Ala  PHYSICAL THERAPY DISCHARGE SUMMARY  Visits from Start of Care: 7  Current functional level related to goals / functional outcomes: All goals met, patient has returned to work and will transition to [facility]   Remaining deficits: None noted   Education / Equipment: HEP Plan: Patient agrees to discharge.  Patient goals were met. Patient is being discharged due to meeting the stated rehab goals.  ?????      Encounter Date: 08/08/2020   PT End of Session - 08/08/20 1545    Visit Number 7    Number of Visits 8    Date for PT Re-Evaluation 09/17/20    Authorization Type BCBS    PT Start Time 1530    PT Stop Time 1615    PT Time Calculation (min) 45 min    Equipment Utilized During Treatment Gait belt    Activity Tolerance Patient tolerated treatment well    Behavior During Therapy WFL for tasks assessed/performed           Past Medical History:  Diagnosis Date  . Acute cerebral infarction ([place])   . Acute CHF (congestive heart failure) ([place]) 05/22/2018  . Acute encephalopathy 06/21/2020  . Acute renal failure (ARF) ([place]) 06/18/2020  . Chronic systolic congestive heart failure ([place])   . Diabetes mellitus type I ([place])   . Diabetes mellitus without complication ([place])   . Dysphagia, post-stroke   . Essential hypertension   . Iron deficiency anemia 04/16/2018  . Stage 3 chronic kidney disease [facility])     Past Surgical History:  Procedure Laterality Date  . EYE SURGERY    . RIGHT/LEFT HEART CATH AND CORONARY ANGIOGRAPHY N/A 05/25/2018   Procedure: RIGHT/LEFT HEART CATH AND CORONARY ANGIOGRAPHY;  Surgeon: Corey Skains, MD;  Location: [facility] CV LAB;   Service: Cardiovascular;  Laterality: N/A;  . TONSILLECTOMY      There were no vitals filed for this visit.   Subjective Assessment - 08/08/20 1605    Subjective No falls or medical cahanges to note, has returned to work w/o incident and will transitioning back to [facility] routine    Patient is accompained by: --    Pertinent History spouse [name]    Limitations Walking    How long can you sit comfortably? n/a    How long can you stand comfortably? n/a    How long can you walk comfortably? 15 minutes    Currently in Pain? No/denies              [facility] PT Assessment - 08/09/20 0001      Functional Gait  Assessment   Gait assessed  Yes    Gait Level Surface Walks 20 ft in less than 5.5 sec, no assistive devices, good speed, no evidence for imbalance, normal gait pattern, deviates no more than 6 in outside of the 12 in walkway width.    Change in Gait Speed Able to smoothly change walking speed without loss of balance or gait deviation. Deviate no more than 6 in outside of the 12 in walkway width.    Gait with Horizontal Head Turns Performs head turns smoothly with no change in gait. Deviates no more than 6 in outside 12 in walkway width  Gait with Vertical Head Turns Performs head turns with no change in gait. Deviates no more than 6 in outside 12 in walkway width.    Gait and Pivot Turn Pivot turns safely within 3 sec and stops quickly with no loss of balance.    Step Over Obstacle Is able to step over 2 stacked shoe boxes taped together (9 in total height) without changing gait speed. No evidence of imbalance.    Gait with Narrow Base of Support Is able to ambulate for 10 steps heel to toe with no staggering.    Gait with Eyes Closed Walks 20 ft, no assistive devices, good speed, no evidence of imbalance, normal gait pattern, deviates no more than 6 in outside 12 in walkway width. Ambulates 20 ft in less than 7 sec.    Ambulating Backwards Walks 20 ft, no assistive devices, good speed,  no evidence for imbalance, normal gait    Steps Alternating feet, no rail.    Total Score 30              08/08/20 0001  Knee/Hip Exercises: Aerobic  Elliptical 5/5' fwd/bwd                     Balance Exercises - 08/09/20 0001      Balance Exercises: Standing   Rockerboard Anterior/posterior;Lateral;Limitations    Rockerboard Limitations perfromed squats in both board positions, as well as step overs in // bars using low profile bars    Step Ups Forward;4 inch;6 inch;Intermittent UE support;Limitations    Step Ups Limitations performed from Airex to 6" block 15x per LE followed by solid surface to Airex ontop of 4" block 15x per LE    Other Standing Exercises performed lunges onto cushioned 4" step             PT Education - 08/08/20 1544    Education Details Verbal review of areas and machines to focus on at [facility]) Educated Patient    Methods Explanation    Comprehension Verbalized understanding            PT Short Term Goals - 07/22/20 1509      PT SHORT TERM GOAL #1   Title Demo I in initial HEP; 07/22/20 able to demo HEP back to PT w/o cuing    Baseline no HEP    Time 2    Period Weeks    Status Achieved    Target Date 07/26/20      PT SHORT TERM GOAL #2   Title able to STS w/o need of UE support    Baseline needs single UE to transfer; able to stand I w/o UE support    Time 2    Period Weeks    Status Achieved    Target Date 07/26/20      PT SHORT TERM GOAL #3   Title Ambulate with SPC vs. walking stick safely    Baseline S with walkng stick; ambulating community distances w/o AD    Time 2    Period Weeks    Status Achieved    Target Date 07/26/20             PT Long Term Goals - 08/08/20 1546      PT LONG TERM GOAL #1   Title Patient demos 4+/5 strength as evidenced by observation of functional tasks including squatting and lunging    Baseline 4/5 BLE strength; 08/08/20 4+/5 BLE strength  Time 4    Period  Weeks    Status Achieved      PT LONG TERM GOAL #2   Title Patient to ambulate 1074f with LRAD and distant S    Baseline in clinic distances with S and walking stick; 08/08/20 Patient able to ambulate 10060fI across level and grassy surfaces, including retrowalking, changes in gait speed and small jumping tasks w/o LOB    Time 4    Period Weeks    Status Achieved      PT LONG TERM GOAL #3   Title Assess FGA and set/achieve appropriate goal; 07/16/20 FGA goal 27; 08/08/20 FGA score 30    Baseline n/a; 07/16/20 FGA 23    Time 4    Period Weeks    Status Achieved                 Plan - 08/08/20 1545    Clinical Impression Statement All goals met, DC to HEP and [employer]    Personal Factors and Comorbidities Comorbidity 1;Comorbidity 2    Comorbidities DM CHF    Stability/Clinical Decision Making Stable/Uncomplicated    Rehab Potential Good    PT Frequency 2x / week    PT Duration 4 weeks    PT Treatment/Interventions Aquatic Therapy;DME Instruction;Gait training;Stair training;Functional mobility training;Therapeutic activities;Therapeutic exercise;Balance training;Neuromuscular re-education;Patient/family education;Orthotic Fit/Training    PT Next Visit Plan DC OPPT    PT Home Exercise Plan see medbridge    Consulted and Agree with Plan of Care Patient    Family Member Consulted dtr GlPeter [place]         Patient will benefit from skilled therapeutic intervention in order to improve the following deficits and impairments:  Abnormal gait,Decreased balance,Decreased endurance,Decreased mobility,Difficulty walking,Decreased activity tolerance,Decreased coordination,Decreased safety awareness,Decreased strength  Visit Diagnosis: Unsteadiness on feet  Muscle weakness (generalized)     Problem List Patient Active Problem List   Diagnosis Date Noted  . HFrEF (heart failure with reduced ejection fraction) (HCEnoch02/25/2022  . Chronic kidney disease (CKD), stage IV (severe) (HCBazile Mills 07/18/2020  . Palpitations 07/18/2020  . Acute cerebral infarction (HCMillers[place]  . Diabetes mellitus type I (HCFort R[place]  . Stage 3 chronic kidney disease (HCOld [place]  . Essential hypertension   . Chronic systolic congestive heart failure (HCGlendale Heights  . Dysphagia, post-stroke   . Acute encephalopathy 06/21/2020  . Acute renal failure (ARF) (HCMountain Village01/26/2022  . Acute CHF (congestive heart failure) (HCKenilworth12/30/2019  . Iron deficiency anemia 04/16/2018    JeLanice ShirtsT 08/09/2020, 4:04 PM  [place] RidgerKnippa[place]hone: [phone] Fax:  [phone]: BrKHALIEL MOREYRN: [id number] of Birth: 1/08-16-1987

## 2020-09-01 ENCOUNTER — Other Ambulatory Visit: Payer: Self-pay

## 2020-09-01 DIAGNOSIS — N184 Chronic kidney disease, stage 4 (severe): Secondary | ICD-10-CM

## 2020-09-02 ENCOUNTER — Encounter: Payer: Self-pay | Admitting: Endocrinology

## 2020-09-16 ENCOUNTER — Telehealth: Payer: Self-pay | Admitting: *Deleted

## 2020-09-16 ENCOUNTER — Ambulatory Visit: Payer: BC Managed Care – PPO | Admitting: Diagnostic Neuroimaging

## 2020-09-16 ENCOUNTER — Encounter: Payer: Self-pay | Admitting: Diagnostic Neuroimaging

## 2020-09-16 NOTE — Telephone Encounter (Signed)
Patient was no show for new patient appointment today. 

## 2020-09-23 ENCOUNTER — Ambulatory Visit (HOSPITAL_COMMUNITY): Payer: BC Managed Care – PPO

## 2020-09-23 ENCOUNTER — Encounter: Payer: BC Managed Care – PPO | Admitting: Vascular Surgery

## 2020-10-23 NOTE — Progress Notes (Deleted)
Cardiology Office Note:    Date:  10/23/2020   ID:  Adrian Evans, DOB 14-Apr-1986, MRN [id number]  PCP:  Lenard Simmer, MD   [place]  Cardiologist:  None  Advanced Practice Provider:  No care team member to display Electrophysiologist:  None    CC: Follow up for Heart Failure  History of Present Illness:    Adrian Evans is a 35 y.o. male with a hx of HFrEF, HTN with T1DM, Prior Stroke, CKD Stage IV, and IDA who presents for evaluation 07/18/20. Started GDMT.  In interim of this visit, patient ***.  Nephrology ***.  Unclear why heart monitor never completed.  Patient notes that he is doing ***.  Since last visit notes *** changes.  Relevant interval testing or therapy include ***.  There are no*** interval hospital/ED visit.    No chest pain or pressure ***.  No SOB/DOE*** and no PND/Orthopnea***.  No weight gain or leg swelling***.  No palpitations or syncope ***.  Ambulatory blood pressure ***.   Past Medical History:  Diagnosis Date  . Acute cerebral infarction ([place])   . Acute CHF (congestive heart failure) ([place]) 05/22/2018  . Acute encephalopathy 06/21/2020  . Acute renal failure (ARF) ([place]) 06/18/2020  . Chronic systolic congestive heart failure ([place])   . Diabetes mellitus type I ([place])   . Diabetes mellitus without complication ([place])   . Dysphagia, post-stroke   . Essential hypertension   . Iron deficiency anemia 04/16/2018  . Stage 3 chronic kidney disease [facility])     Past Surgical History:  Procedure Laterality Date  . EYE SURGERY    . RIGHT/LEFT HEART CATH AND CORONARY ANGIOGRAPHY N/A 05/25/2018   Procedure: RIGHT/LEFT HEART CATH AND CORONARY ANGIOGRAPHY;  Surgeon: Corey Skains, MD;  Location: [facility];  Service: Cardiovascular;  Laterality: N/A;  . TONSILLECTOMY      Current Medications: No outpatient medications have been marked as taking for the 10/24/20 encounter (Appointment) with Werner Lean, MD.      Allergies:   Patient has no known allergies.   Social History   Socioeconomic History  . Marital status: Married    Spouse name: Not on file  . Number of children: 2  . Years of education: Not on file  . Highest education level: Not on file  Occupational History  . Not on file  Tobacco Use  . Smoking status: Never Smoker  . Smokeless tobacco: Never Used  Substance and Sexual Activity  . Alcohol use: Not Currently  . Drug use: Not Currently  . Sexual activity: Not Currently  Other Topics Concern  . Not on file  Social History Narrative   Education officer, museum   Social Determinants of Health   Financial Resource Strain: Not on file  Food Insecurity: Not on file  Transportation Needs: Not on file  Physical Activity: Not on file  Stress: Not on file  Social Connections: Not on file     Family History: History of coronary artery disease notable for no members. History of heart failure notable for no members. No history of cardiomyopathies including hypertrophic cardiomyopathy, left ventricular non-compaction, or arrhythmogenic right ventricular cardiomyopathy. History of arrhythmia notable for no members. [place] unexpectedly of unknown cause- possible marijuana laced medication. No history of bicuspid aortic valve or aortic aneurysm or dissection.  ROS:   Please see the history of present illness.     All other systems reviewed and are negative.  EKGs/Labs/Other Studies Reviewed:    The following studies were reviewed today:  EKG:   06/24/20: SR 91 QRS durration 108 but with frequent outflow tract PVCs  Transthoracic Echocardiogram: Date: 06/29/20 Results: 1. Since th elast study on 06/19/2020 LVEF has decreased from 40% to  25-30% with diffuse hypokinesis. RVEF is moderately decreased.  2. Left ventricular ejection fraction, by estimation, is 25 to 30%. The  left ventricle has severely decreased function. The left ventricle  demonstrates global  hypokinesis. The left ventricular internal cavity size  was severely dilated. Left ventricular  diastolic function could not be evaluated.  3. Right ventricular systolic function is moderately reduced. The right  ventricular size is mildly enlarged.  4. A small pericardial effusion is present. The pericardial effusion is  posterior to the left ventricle.  5. Mild mitral valve regurgitation.  6. Aortic valve regurgitation is trivial.   Transesophageal Echocardiogram: Date: 06/23/20 Results: 1. Negative bubble study.  2. No thrombus seen in the left atrium, left atrial appendage or left  ventricle. Because of thickening in the left ventricular apex a Definity  echocontrast was used, but only prominent trabeculations and a large  papillary muscle were seen.  3. Left ventricular ejection fraction, by estimation, is 20 to 25%. The  left ventricle has severely decreased function. The left ventricle has no  regional wall motion abnormalities. The left ventricular internal cavity  size was mildly dilated.  4. Right ventricular systolic function is moderately reduced. The right  ventricular size is moderately enlarged.  5. Left atrial size was moderately dilated. No left atrial/left atrial  appendage thrombus was detected. The LAA emptying velocity was 40 cm/s.  6. Right atrial size was moderately dilated.  7. A small pericardial effusion is present. The pericardial effusion is  circumferential. There is no evidence of cardiac tamponade.  8. The mitral valve is normal in structure. Mild mitral valve  regurgitation. No evidence of mitral stenosis.  9. The tricuspid valve is myxomatous. Tricuspid valve regurgitation is  moderate.  10. The aortic valve is normal in structure. Aortic valve regurgitation is  trivial. No aortic stenosis is present.  11. The inferior vena cava is normal in size with greater than 50%  respiratory variability, suggesting right atrial pressure of 3 mmHg.   12. Agitated saline contrast bubble study was negative, with no evidence  of any interatrial shunt.   Conclusion(s)/Recommendation(s): No LA/LAA thrombus identified. Negative  bubble study for interatrial shunt. No intracardiac source of embolism  detected on this on this transesophageal echocardiogram.  Left/Right Heart Catheterizations: Date: 07/18/20 Results:  Hemodynamic findings consistent with moderate pulmonary hypertension.  There is mild (2+) tricuspid regurgitation.   Assessment The patient has had Acute systolic dysfunction congestive heart failure with New York Heart Association Class IV symptoms.  reduced left ventricular function with ejection fraction of 20%  Pulmonary capillary wedge pressures with moderate elevation. moderate pulmonary hypertension  normal coronary arteries with no 3 vessel arterial disease and up to 0% stenosis  Plan Aggressive medical management of congestive heart failure with appropriate use of beta blockers, ACE inhibitors, and diuretics. Congestive heart failure education and rehabilitation have been recommended.     Recent Labs: 06/18/2020: B Natriuretic Peptide 980.9 06/22/2020: ALT 10 06/27/2020: Hemoglobin 8.5; Platelets 290 07/04/2020: Magnesium 1.8 07/18/2020: BUN 49; Creatinine, Ser 5.72; Potassium 3.9; Sodium 142; TSH 4.010  Recent Lipid Panel    Component Value Date/Time   CHOL 111 06/22/2020 1038   TRIG 106 07/04/2020 0425  HDL 31 (L) 06/22/2020 1038   CHOLHDL 3.6 06/22/2020 1038   VLDL 36 06/22/2020 1038   LDLCALC 44 06/22/2020 1038   Risk Assessment/Calculations:     N/A  Physical Exam:    VS:  There were no vitals taken for this visit.    Wt Readings from Last 3 Encounters:  07/18/20 97.5 kg  07/04/20 109 kg  06/21/20 98.6 kg    GEN:  Well nourished, well developed in no acute distress HEENT: Normal NECK: No JVD; No carotid bruits *** LYMPHATICS: No lymphadenopathy CARDIAC: RRR, no murmurs, rubs,  gallops RESPIRATORY:  Clear to auscultation without rales, wheezing or rhonchi  ABDOMEN: Soft, non-tender, non-distended MUSCULOSKELETAL:  No edema; No deformity  SKIN: Warm and dry NEUROLOGIC:  Alert and oriented x 3 PSYCHIATRIC:  Normal affect   ASSESSMENT:    No diagnosis found. PLAN:    In order of problems listed above:  Heart Failure Reduced Ejection Fraction  CKD Stage IV HTN with T1 DM Palpitations - NYHA class II, Stage B, euvolemic, etiology from unknown causes - Diuretic regimen: Lasix 40 mg PO - Strict I/Os, daily weights, and fluid restriction of < 2 L education given  - BMP - Coreg 25 mg PO BID - ARNI/ARB/ACEi;  Aldactone- held in the setting of kidney issues - Hydralazine 25 mg PO BID  - SGLT2i held off with kidney and T1 DM -Ferritin 796 -HIV- non reactive - SPEP normal to no elevation 06/18/20 - check TSH - Will check CT and CMR in the future based on his CKD  *** follow up unless new symptoms or abnormal test results warranting change in plan  Would be reasonable for *** Video Visit Follow up  Would be reasonable for *** APP Follow up       Medication Adjustments/Labs and Tests Ordered: Current medicines are reviewed at length with the patient today.  Concerns regarding medicines are outlined above.  No orders of the defined types were placed in this encounter.  No orders of the defined types were placed in this encounter.   There are no Patient Instructions on file for this visit.   Signed, Werner Lean, MD  10/23/2020 8:03 AM    [place]

## 2020-10-24 ENCOUNTER — Ambulatory Visit: Payer: BC Managed Care – PPO | Admitting: Internal Medicine

## 2021-02-11 ENCOUNTER — Telehealth (HOSPITAL_BASED_OUTPATIENT_CLINIC_OR_DEPARTMENT_OTHER): Payer: Self-pay | Admitting: Internal Medicine

## 2021-02-11 NOTE — Telephone Encounter (Signed)
Received fax from [facility]. Letter is informing that Mr. Kilday is being considered for Kidney Transplant. He was seen for transplant evaluation on 01/13/21. He will need the following testing to complete their evaluation:  CT Abdomen/Pelvis without IV contrast  Stress echocardiogram (Z01.81 Pre-operative cardiovascular clearance)  Echocardiogram (Z01.81 Pre-operative cardiovascular clearance)  Letter states these tests need to be completed prior to committee presentation and potential wait listing. Need to be scheduled as soon as possible. Please have results faxed to [phone], Elisabeth Most, RN.   This letter was also sent to Dr. Carolin Sicks [facility] [id number]; F: [phone]).

## 2021-02-12 NOTE — Telephone Encounter (Signed)
Noted. Thank you.   Royce Stegman S Yvette Loveless, NP  

## 2021-02-13 ENCOUNTER — Ambulatory Visit (HOSPITAL_BASED_OUTPATIENT_CLINIC_OR_DEPARTMENT_OTHER): Payer: BC Managed Care – PPO | Admitting: Family

## 2021-02-13 NOTE — Telephone Encounter (Signed)
Epic fax failed. Printing and will send manually.

## 2021-02-13 NOTE — Telephone Encounter (Signed)
Can we please just send a note to [facility] that he rescheduled for 03/18/21 so they are aware?  Thanks,  Loel Dubonnet, NP

## 2021-03-18 ENCOUNTER — Ambulatory Visit (HOSPITAL_BASED_OUTPATIENT_CLINIC_OR_DEPARTMENT_OTHER): Payer: BC Managed Care – PPO | Admitting: Family

## 2021-04-21 ENCOUNTER — Telehealth (HOSPITAL_BASED_OUTPATIENT_CLINIC_OR_DEPARTMENT_OTHER): Payer: Self-pay | Admitting: Family

## 2021-04-21 NOTE — Telephone Encounter (Signed)
Spoke with wife per DPR.  She had questions regarding testing scheduled for tomorrow.  We discussed that tomorrow was simply an office visit to coordinate testing as he has not been seen since 06/2020 with recommendation for 3 mos follow up at that time.   Per previous note 02/11/21 for kidney transplant workup he will need. CT Abd/Pelvis w/o IV contrast Echocardiogram NM Spect or Stress MRI  Testing will be scheduled at his upcoming office visit.  His wife verbalized understanding was appreciative of the explanation.  Loel Dubonnet, NP

## 2021-04-22 ENCOUNTER — Encounter (HOSPITAL_BASED_OUTPATIENT_CLINIC_OR_DEPARTMENT_OTHER): Payer: Self-pay | Admitting: Family

## 2021-04-22 ENCOUNTER — Ambulatory Visit (INDEPENDENT_AMBULATORY_CARE_PROVIDER_SITE_OTHER): Payer: BC Managed Care – PPO | Admitting: Family

## 2021-04-22 ENCOUNTER — Other Ambulatory Visit: Payer: Self-pay

## 2021-04-22 VITALS — BP 140/110 | HR 73 | Ht 72.0 in | Wt 233.0 lb

## 2021-04-22 DIAGNOSIS — N184 Chronic kidney disease, stage 4 (severe): Secondary | ICD-10-CM

## 2021-04-22 DIAGNOSIS — I5020 Unspecified systolic (congestive) heart failure: Secondary | ICD-10-CM

## 2021-04-22 DIAGNOSIS — I10 Essential (primary) hypertension: Secondary | ICD-10-CM | POA: Diagnosis not present

## 2021-04-22 DIAGNOSIS — Z01810 Encounter for preprocedural cardiovascular examination: Secondary | ICD-10-CM

## 2021-04-22 MED ORDER — HYDRALAZINE HCL 50 MG PO TABS: 50.0000 mg | ORAL_TABLET | Freq: Two times a day (BID) | ORAL | 1 refills | Status: DC

## 2021-04-22 NOTE — Patient Instructions (Addendum)
Medication Instructions:  Your physician has recommended you make the following change in your medication:   CHANGE Hydralazine to 50mg  twice daily  *If you need a refill on your cardiac medications before your next appointment, please call your pharmacy*   Lab Work: None ordered today  Testing/Procedures: Your physician has requested that you have an echocardiogram. Echocardiography is a painless test that uses sound waves to create images of your heart. It provides your doctor with information about the size and shape of your heart and how well your heart's chambers and valves are working. This procedure takes approximately one hour. There are no restrictions for this procedure.   Your provider has requested that you have a CT of your abdomen and pelvis as part of your kidney transplant workup.  Your physician has requested that you have a lexiscan myoview. Please follow instruction sheet, as given.    Follow-Up: At [facility], you and your health needs are our priority.  As part of our continuing mission to provide you with exceptional heart care, we have created designated Provider Care Teams.  These Care Teams include your primary Cardiologist (physician) and Advanced Practice Providers (APPs -  Physician Assistants and Nurse Practitioners) who all work together to provide you with the care you need, when you need it.  We recommend signing up for the patient portal called "MyChart".  Sign up information is provided on this After Visit Summary.  MyChart is used to connect with patients for Virtual Visits (Telemedicine).  Patients are able to view lab/test results, encounter notes, upcoming appointments, etc.  Non-urgent messages can be sent to your provider as well.   To learn more about what you can do with MyChart, go to [email or web address].    Your next appointment:   3 months   The format for your next appointment:   In Person  Provider:   Werner Lean, MD   or Loel Dubonnet, NP    Other Instructions  Leane Call (Stress Test) Instructions Your provider has ordered a Myocardial Perfusion Imaging Study Please arrive 15 minutes prior to your appointment time for registration and insurance purposes.  The test will take approximately 3 to 4 hours to complete; you may bring reading material.  If someone comes with you to your appointment, they will need to remain in the main lobby due to limited space in the testing area.   How to prepare for your Myocardial Perfusion Test: Do not eat or drink 3 hours prior to your test, except you may have water. Do not consume products containing caffeine (regular or decaffeinated) 12 hours prior to your test. (ex: coffee, chocolate, sodas, tea). Do bring a list of your current medications with you.  If not listed below, you may take your medications as normal. Do not take carvedilol (Coreg) for 24 hours prior to the test.  Bring the medication to your appointment as you may be required to take it once the test is complete. Do wear comfortable clothes (no dresses or overalls) and walking shoes, tennis shoes preferred (No heels or open toe shoes are allowed). Do NOT wear cologne, perfume, aftershave, or lotions (deodorant is allowed). If these instructions are not followed, your test will have to be rescheduled.  Please report to [address], [address] for your test.  If you have questions or concerns about your appointment, you can call the Nuclear Lab at [phone].  If you cannot keep your appointment, please provide 24 hours notification  to the Nuclear Lab, to avoid a possible $50 charge to your account.

## 2021-04-22 NOTE — Progress Notes (Signed)
Office Visit    Patient Name: Adrian Evans Date of Encounter: 04/22/2021  PCP:  Lenard Simmer, MD   [place]  Cardiologist:  Werner Lean, MD  Advanced Practice Provider:  No care team member to display Electrophysiologist:  None      Chief Complaint    Adrian Evans is a 35 y.o. male with a hx of HFrEF, hypertension, type 1 diabetes, CVA, CKD IV, IDA  presents today for preoperative clearance for kidney transplant  Past Medical History    Past Medical History:  Diagnosis Date   Acute cerebral infarction ([place])    Acute CHF (congestive heart failure) ([place]) 05/22/2018   Acute encephalopathy 06/21/2020   Acute renal failure (ARF) ([place]) 06/25/5425   Chronic systolic congestive heart failure ([place])    Diabetes mellitus type I ([place])    Diabetes mellitus without complication ([place])    Dysphagia, post-stroke    Essential hypertension    Iron deficiency anemia 04/16/2018   Stage 3 chronic kidney disease ([place])    Past Surgical History:  Procedure Laterality Date   EYE SURGERY     RIGHT/LEFT HEART CATH AND CORONARY ANGIOGRAPHY N/A 05/25/2018   Procedure: RIGHT/LEFT HEART CATH AND CORONARY ANGIOGRAPHY;  Surgeon: Corey Skains, MD;  Location: [facility];  Service: Cardiovascular;  Laterality: N/A;   TONSILLECTOMY      Allergies  No Known Allergies  History of Present Illness    Adrian Evans is a 35 y.o. male with a hx of HFrEF, hypertension, type 1 diabetes, CVA, CKD IV, IDA last seen 07/18/2020 by Dr. Gasper Sells.  Initially diagnosed with heart failure in 2019 in the setting of leg swelling.  He had acute encephalopathy evaluation January 2022 thought to be either stroke or uremic encephalopathy.  He was last seen 07/18/2020 and was feeling well post discharge.  He had lost about 10 pounds.  A/ARB/Arni as well as SGLT2 I were not utilized due to kidney dysfunction.  A 14-day monitor was placed due to palpitations  though unfortunately lost.  He presents today for follow up with his wife.  He works as an Therapist, music at a school.  Blood pressure at home 140s over 85-90.  No chest pain, pressure, tightness.  Dors is dyspnea on exertion with activity such as stairs.  No orthopnea, PND.  Notes lower extremity bilateral edema most notable in the afternoons.  Improves with elevation.  He does wear compression sleeves with improvement.  Endorses following low-sodium, healthy diet.  Denies lightheadedness, dizziness, dyspnea, syncope.  Reports no palpitations.   EKGs/Labs/Other Studies Reviewed:   The following studies were reviewed today:  Transthoracic Echocardiogram: Date: 06/29/20 Results:  1. Since th elast study on 06/19/2020 LVEF has decreased from 40% to  25-30% with diffuse hypokinesis. RVEF is moderately decreased.   2. Left ventricular ejection fraction, by estimation, is 25 to 30%. The  left ventricle has severely decreased function. The left ventricle  demonstrates global hypokinesis. The left ventricular internal cavity size  was severely dilated. Left ventricular  diastolic function could not be evaluated.   3. Right ventricular systolic function is moderately reduced. The right  ventricular size is mildly enlarged.   4. A small pericardial effusion is present. The pericardial effusion is  posterior to the left ventricle.   5. Mild mitral valve regurgitation.   6. Aortic valve regurgitation is trivial.    Transesophageal Echocardiogram: Date: 06/23/20 Results: 1. Negative bubble study.  2. No thrombus seen in the left atrium, left atrial appendage or left  ventricle. Because of thickening in the left ventricular apex a Definity  echocontrast was used, but only prominent trabeculations and a large  papillary muscle were seen.   3. Left ventricular ejection fraction, by estimation, is 20 to 25%. The  left ventricle has severely decreased function. The left ventricle has no  regional  wall motion abnormalities. The left ventricular internal cavity  size was mildly dilated.   4. Right ventricular systolic function is moderately reduced. The right  ventricular size is moderately enlarged.   5. Left atrial size was moderately dilated. No left atrial/left atrial  appendage thrombus was detected. The LAA emptying velocity was 40 cm/s.   6. Right atrial size was moderately dilated.   7. A small pericardial effusion is present. The pericardial effusion is  circumferential. There is no evidence of cardiac tamponade.   8. The mitral valve is normal in structure. Mild mitral valve  regurgitation. No evidence of mitral stenosis.   9. The tricuspid valve is myxomatous. Tricuspid valve regurgitation is  moderate.  10. The aortic valve is normal in structure. Aortic valve regurgitation is  trivial. No aortic stenosis is present.  11. The inferior vena cava is normal in size with greater than 50%  respiratory variability, suggesting right atrial pressure of 3 mmHg.  12. Agitated saline contrast bubble study was negative, with no evidence  of any interatrial shunt.   Conclusion(s)/Recommendation(s): No LA/LAA thrombus identified. Negative  bubble study for interatrial shunt. No intracardiac source of embolism  detected on this on this transesophageal echocardiogram.   Left/Right Heart Catheterizations: Date: 07/18/20 Results: Hemodynamic findings consistent with moderate pulmonary hypertension. There is mild (2+) tricuspid regurgitation.   Assessment The patient has had Acute systolic dysfunction congestive heart failure with New York Heart Association Class IV symptoms.   reduced left ventricular function with ejection fraction of 20%   Pulmonary capillary wedge pressures with moderate elevation. moderate pulmonary hypertension   normal coronary arteries with no 3 vessel arterial disease and up to 0% stenosis   Plan Aggressive medical management of congestive heart  failure with appropriate use of beta blockers, ACE inhibitors, and diuretics. Congestive heart failure education and rehabilitation have been recommended  EKG:  EKG is  ordered today.  The ekg ordered today demonstrates normal sinus rhythm 73 bpm with LAFB and stable TWI V6.  Recent Labs: 06/18/2020: B Natriuretic Peptide 980.9 06/22/2020: ALT 10 06/27/2020: Hemoglobin 8.5; Platelets 290 07/04/2020: Magnesium 1.8 07/18/2020: BUN 49; Creatinine, Ser 5.72; Potassium 3.9; Sodium 142; TSH 4.010  Recent Lipid Panel    Component Value Date/Time   CHOL 111 06/22/2020 1038   TRIG 106 07/04/2020 0425   HDL 31 (L) 06/22/2020 1038   CHOLHDL 3.6 06/22/2020 1038   VLDL 36 06/22/2020 1038   LDLCALC 44 06/22/2020 1038    Home Medications   Current Meds  Medication Sig   aspirin 81 MG chewable tablet CHEW 1 TABLET (81 MG TOTAL) BY MOUTH DAILY.   Chlorhexidine Gluconate Cloth 2 % PADS Apply 6 each topically daily.   clopidogrel (PLAVIX) 75 MG tablet TAKE 1 TABLET (75 MG TOTAL) BY MOUTH DAILY FOR 21 DAYS.   furosemide (LASIX) 40 MG tablet Take 1 tablet (40 mg total) by mouth daily as needed for fluid or edema.   hydrALAZINE (APRESOLINE) 25 MG tablet Take 1 tablet (25 mg total) by mouth 2 (two) times daily.   insulin aspart (NOVOLOG) 100  UNIT/ML FlexPen Sliding scale CBG 70 - 120: 0 units  CBG 121 - 150: 1 unit  CBG 151 - 200: 2 units  CBG 201 - 250: 3 units  CBG 251 - 300: 5 units  CBG 301 - 350: 7 units  CBG 351 - 400 9 units   insulin aspart (NOVOLOG) 100 UNIT/ML FlexPen USE AS DIRECTED PER ATTACHED SLIDING SCALE   Insulin Pen Needle 31G X 5 MM MISC 1 Device by Does not apply route 3 (three) times daily as needed (for insulin pen).   Insulin Pen Needle 32G X 4 MM MISC 1 DEVICE BY DOES NOT APPLY ROUTE 3 (THREE) TIMES DAILY AS NEEDED (FOR INSULIN PEN).     Review of Systems      All other systems reviewed and are otherwise negative except as noted above.  Physical Exam    VS:  BP (!)  140/110   Pulse 73   Ht 6' (1.829 m)   Wt 233 lb (105.7 kg)   BMI 31.60 kg/m  , BMI Body mass index is 31.6 kg/m.  Wt Readings from Last 3 Encounters:  04/22/21 233 lb (105.7 kg)  07/18/20 215 lb (97.5 kg)  07/04/20 240 lb 4.8 oz (109 kg)     GEN: Well nourished, well developed, in no acute distress. HEENT: normal. Neck: Supple, no JVD, carotid bruits, or masses. Cardiac: RRR, no murmurs, rubs, or gallops. No clubbing, cyanosis. Nonpitting bilateral LE edema.  Radials/PT 2+ and equal bilaterally.  Respiratory:  Respirations regular and unlabored, clear to auscultation bilaterally. GI: Soft, nontender, nondistended. MS: No deformity or atrophy. Skin: Warm and dry, no rash. Neuro:  Strength and sensation are intact. Psych: Normal affect.  Assessment & Plan    Preop clearance for kidney transplant -following at [facility] for kidney transit.  Received notification from their office that he will require Lexiscan Myoview, echocardiogram, CT abdomen/pelvis without contrast.  Orders placed today.  Cardiac clearance pending study results.  HFrEF - Grossly euvolemic on exam. Nonpitting bilateral lower extremity edema. GDMT limited by CKD IV. Undergoing workup for transplant. GDMT includes Lasix 40mg  QD, Hydralazine 50mg  BID, Coreg 25mg  BID. Low salt diet, fluid restriction <2L recommended. Heart healthy diet and regular cardiovascular exercise encouraged.  Avoid ACE/ARB/Arni/MRA/SGLT2 due to kidney disease.  Hypertension - BP not at goal. Continue Coreg 25mg  BID. Increase Hydralazine to 50mg  BID. If BP not at goal of <130/80 at follow up, consider further up titration.   Type I diabetes- Continue to follow with PCP.   CKD - Following with [facility] transplant services.   Palpitations - No recurrence. Previous ZIO lost in mail. As no recurrence, will not repeat monitor. Continue Coreg at current dose.   Disposition: Follow up in 3 month(s) with Werner Lean, MD or APP.  Signed, Loel Dubonnet, NP 04/22/2021, 8:48 AM [place]

## 2021-04-23 NOTE — Addendum Note (Signed)
Addended by: Loel Dubonnet on: 04/23/2021 08:48 AM   Modules accepted: Orders

## 2021-04-27 ENCOUNTER — Telehealth: Payer: Self-pay | Admitting: Internal Medicine

## 2021-04-27 NOTE — Telephone Encounter (Signed)
Diuretic increase to lasix 80 PO daily as he missed some doses- given his CKD diuretic is managed by [facility]- records received.  Rudean Haskell, MD [facility]  [address] [place], [place] [address] [phone]  11:28 AM

## 2021-04-28 ENCOUNTER — Telehealth (HOSPITAL_BASED_OUTPATIENT_CLINIC_OR_DEPARTMENT_OTHER): Payer: Self-pay | Admitting: Family

## 2021-04-28 NOTE — Telephone Encounter (Signed)
Left message 04/27/21 and 04/28/21 for patient to call and discuss scheduling CT abdomen and pelvis ordered by Laurann Montana, NP

## 2021-04-28 NOTE — Telephone Encounter (Signed)
Spoke with Mrs. Aughenbaugh regarding the Wednesday 05/13/21 10:00 am CTA abdomen/pelvis without contrast at [facility], ground floor).  Patient will come in 05/12/21 to pick up contrast he needs to drink prior to exam . Arrival time is 9:45 am 05/13/21 for check in---NPO 4 hours prior to study.  Mrs. Fitterer voiced her understanding and was encouraged to call with questions or concerns.Marland Kitchen

## 2021-04-30 ENCOUNTER — Telehealth (HOSPITAL_COMMUNITY): Payer: Self-pay | Admitting: *Deleted

## 2021-04-30 ENCOUNTER — Encounter (HOSPITAL_COMMUNITY): Payer: Self-pay | Admitting: *Deleted

## 2021-04-30 NOTE — Telephone Encounter (Signed)
Left message on voicemail in reference to upcoming appointment scheduled for  05/07/21 Phone number given for a call back so details instructions can be given.  Adrian Evans

## 2021-05-05 ENCOUNTER — Telehealth (HOSPITAL_COMMUNITY): Payer: Self-pay | Admitting: *Deleted

## 2021-05-05 NOTE — Telephone Encounter (Signed)
Left message on voicemail per DPR in reference to upcoming appointment scheduled on 05/07/21 at 0800 with detailed instructions given per Myocardial Perfusion Study Information Sheet for the test. LM to arrive 15 minutes early, and that it is imperative to arrive on time for appointment to keep from having the test rescheduled. If you need to cancel or reschedule your appointment, please call the office within 24 hours of your appointment. Failure to do so may result in a cancellation of your appointment, and a $50 no show fee. Phone number given for call back for any questions. Kinsly Hild, Ranae Palms

## 2021-05-07 ENCOUNTER — Other Ambulatory Visit (HOSPITAL_BASED_OUTPATIENT_CLINIC_OR_DEPARTMENT_OTHER): Payer: BC Managed Care – PPO

## 2021-05-07 ENCOUNTER — Other Ambulatory Visit: Payer: Self-pay

## 2021-05-07 ENCOUNTER — Encounter (HOSPITAL_BASED_OUTPATIENT_CLINIC_OR_DEPARTMENT_OTHER): Payer: Self-pay

## 2021-05-07 ENCOUNTER — Ambulatory Visit (HOSPITAL_COMMUNITY): Payer: BC Managed Care – PPO | Attending: Cardiovascular Disease

## 2021-05-07 ENCOUNTER — Ambulatory Visit (INDEPENDENT_AMBULATORY_CARE_PROVIDER_SITE_OTHER): Payer: BC Managed Care – PPO

## 2021-05-07 DIAGNOSIS — N184 Chronic kidney disease, stage 4 (severe): Secondary | ICD-10-CM

## 2021-05-07 DIAGNOSIS — Z01810 Encounter for preprocedural cardiovascular examination: Secondary | ICD-10-CM | POA: Insufficient documentation

## 2021-05-07 DIAGNOSIS — I5020 Unspecified systolic (congestive) heart failure: Secondary | ICD-10-CM | POA: Insufficient documentation

## 2021-05-07 LAB — MYOCARDIAL PERFUSION IMAGING
LV dias vol: 304 mL (ref 62–150)
LV sys vol: 246 mL
Nuc Stress EF: 19 %
Peak HR: 83 {beats}/min
Rest HR: 73 {beats}/min
Rest Nuclear Isotope Dose: 10.1 mCi
SDS: 3
SRS: 5
SSS: 8
ST Depression (mm): 0 mm
Stress Nuclear Isotope Dose: 31.6 mCi
TID: 0.97

## 2021-05-07 LAB — ECHOCARDIOGRAM COMPLETE
AR max vel: 3.36 cm2
AV Area VTI: 2.98 cm2
AV Area mean vel: 2.92 cm2
AV Mean grad: 2 mmHg
AV Peak grad: 3.3 mmHg
AV Vena cont: 0.35 cm
Ao pk vel: 0.9 m/s
Area-P 1/2: 4.54 cm2
Calc EF: 24.1 %
Height: 72 in
S' Lateral: 6.08 cm
Single Plane A2C EF: 24.2 %
Single Plane A4C EF: 26.8 %
Weight: 3728 oz

## 2021-05-07 MED ORDER — TECHNETIUM TC 99M TETROFOSMIN IV KIT
10.1000 | PACK | Freq: Once | INTRAVENOUS | Status: AC | PRN
  Administered 2021-05-07: 10.1 via INTRAVENOUS
  Filled 2021-05-07: qty 11

## 2021-05-07 MED ORDER — TECHNETIUM TC 99M TETROFOSMIN IV KIT
31.6000 | PACK | Freq: Once | INTRAVENOUS | Status: AC | PRN
  Administered 2021-05-07: 31.6 via INTRAVENOUS
  Filled 2021-05-07: qty 32

## 2021-05-07 MED ORDER — REGADENOSON 0.4 MG/5ML IV SOLN
0.4000 mg | Freq: Once | INTRAVENOUS | Status: AC
  Administered 2021-05-07: 0.4 mg via INTRAVENOUS

## 2021-05-08 ENCOUNTER — Encounter (HOSPITAL_BASED_OUTPATIENT_CLINIC_OR_DEPARTMENT_OTHER): Payer: Self-pay

## 2021-05-11 ENCOUNTER — Encounter (HOSPITAL_BASED_OUTPATIENT_CLINIC_OR_DEPARTMENT_OTHER): Payer: Self-pay

## 2021-05-13 ENCOUNTER — Other Ambulatory Visit: Payer: Self-pay

## 2021-05-13 ENCOUNTER — Ambulatory Visit (HOSPITAL_BASED_OUTPATIENT_CLINIC_OR_DEPARTMENT_OTHER)
Admission: RE | Admit: 2021-05-13 | Discharge: 2021-05-13 | Disposition: A | Discharge status: Home / Self Care | Payer: BC Managed Care – PPO | Source: Ambulatory Visit | Attending: Family | Admitting: Family

## 2021-05-13 ENCOUNTER — Encounter (HOSPITAL_BASED_OUTPATIENT_CLINIC_OR_DEPARTMENT_OTHER): Payer: Self-pay

## 2021-05-13 DIAGNOSIS — N184 Chronic kidney disease, stage 4 (severe): Secondary | ICD-10-CM

## 2021-05-13 DIAGNOSIS — Z01810 Encounter for preprocedural cardiovascular examination: Secondary | ICD-10-CM | POA: Insufficient documentation

## 2021-07-01 ENCOUNTER — Encounter: Payer: BC Managed Care – PPO | Admitting: Vascular Surgery

## 2021-07-06 NOTE — H&P (View-Only) (Signed)
[facility]  ASSESSMENT / PLAN: Adrian Evans is a 36 y.o. right handed male in need of permanent dialysis access. I reviewed options for dialysis in detail with the patient, including hemodialysis and peritoneal dialysis. I counseled the patient to ask their nephrologist about their candidacy for renal transplant. I counseled the patient that dialysis access requires surveillance and periodic maintenance. Plan to proceed with laparoscopic PD catheter placement with omentopexy.   CHIEF COMPLAINT: in need of dialysis access  HISTORY OF PRESENT ILLNESS: Adrian Evans is a 36 y.o. male referred to clinic for evaluation of dialysis access.  The patient is a well-informed, highly functional young man who has studied the options available to him.  He is working on getting a transplant through a living related donor, and seems to be making good progress.  He is right-handed.  He has never had abdominal surgery before.  He prefers peritoneal dialysis.  Past Medical History:  Diagnosis Date   Acute cerebral infarction [facility])    Acute CHF (congestive heart failure) ([place]) 05/22/2018   Acute encephalopathy 06/21/2020   Acute renal failure (ARF) ([place]) 7/51/0258   Chronic systolic congestive heart failure ([facility])    Diabetes mellitus type I ([place])    Diabetes mellitus without complication ([facility])    Dysphagia, post-stroke    Essential hypertension    Iron deficiency anemia 04/16/2018   Stage 3 chronic kidney disease ([facility])     Past Surgical History:  Procedure Laterality Date   EYE SURGERY     RIGHT/LEFT HEART CATH AND CORONARY ANGIOGRAPHY N/A 05/25/2018   Procedure: RIGHT/LEFT HEART CATH AND CORONARY ANGIOGRAPHY;  Surgeon: Corey Skains, MD;  Location: [facility];  Service: Cardiovascular;  Laterality: N/A;   TONSILLECTOMY      Family History  Problem Relation Age of Onset   Hypertension Mother     Social History   Socioeconomic History   Marital  status: Married    Spouse name: Not on file   Number of children: 2   Years of education: Not on file   Highest education level: Not on file  Occupational History   Not on file  Tobacco Use   Smoking status: Never   Smokeless tobacco: Never  Substance and Sexual Activity   Alcohol use: Not Currently   Drug use: Not Currently   Sexual activity: Not Currently  Other Topics Concern   Not on file  Social History Narrative   School teacher   Social Determinants of Health   Financial Resource Strain: Not on file  Food Insecurity: Not on file  Transportation Needs: Not on file  Physical Activity: Not on file  Stress: Not on file  Social Connections: Not on file  Intimate Partner Violence: Not on file    No Known Allergies  Current Outpatient Medications  Medication Sig Dispense Refill   acetaminophen (TYLENOL) 325 MG tablet Take 2 tablets (650 mg total) by mouth every 6 (six) hours as needed for mild pain (or Fever >/= 101). 1 tablet 0   bisacodyl (DULCOLAX) 5 MG EC tablet Take 1 tablet (5 mg total) by mouth daily as needed for moderate constipation. 30 tablet 0   Chlorhexidine Gluconate Cloth 2 % PADS Apply 6 each topically daily. 1 each 0   hydrALAZINE (APRESOLINE) 50 MG tablet Take 1 tablet (50 mg total) by mouth in the morning and at bedtime. 180 tablet 1   insulin aspart (NOVOLOG) 100 UNIT/ML FlexPen Sliding scale CBG  70 - 120: 0 units  CBG 121 - 150: 1 unit  CBG 151 - 200: 2 units  CBG 201 - 250: 3 units  CBG 251 - 300: 5 units  CBG 301 - 350: 7 units  CBG 351 - 400 9 units 15 mL 0   Insulin Pen Needle 31G X 5 MM MISC 1 Device by Does not apply route 3 (three) times daily as needed (for insulin pen). 100 each 0   sodium chloride 0.9 % infusion Inject 250 mLs into the vein continuous. 1 mL 0   carvedilol (COREG) 25 MG tablet Take 1 tablet (25 mg total) by mouth 2 (two) times daily with a meal. 60 tablet 0   furosemide (LASIX) 40 MG tablet Take 1 tablet (40 mg total) by  mouth daily as needed for fluid or edema. 30 tablet 0   insulin aspart (NOVOLOG) 100 UNIT/ML FlexPen USE AS DIRECTED PER ATTACHED SLIDING SCALE 15 mL 0   No current facility-administered medications for this visit.    PHYSICAL EXAM Vitals:   07/07/21 0821  BP: (!) 148/97  Pulse: 84  Resp: 20  Temp: 98.2 F (36.8 C)  SpO2: 97%  Weight: 211 lb (95.7 kg)  Height: 6' (1.829 m)    Constitutional: Well-appearing.  No distress. Neurologic: Cranial nerves intact.  No focal findings. Psychiatric:  Mood and affect symmetric and appropriate. Eyes:  No icterus. No conjunctival pallor. Ears, nose, throat:  mucous membranes moist. Midline trachea.  Cardiac: Regular rate and rhythm.  Respiratory:  unlabored. Abdominal:  soft, non-tender, non-distended.  Extremity: no edema. no cyanosis. no pallor.  Skin: no gangrene. no ulceration.  Lymphatic: no Stemmer's sign. no palpable lymphadenopathy.  PERTINENT LABORATORY AND RADIOLOGIC DATA  Most recent CBC CBC Latest Ref Rng & Units 06/27/2020 06/26/2020 06/25/2020  WBC 4.0 - 10.5 K/uL 10.3 10.9(H) 10.5  Hemoglobin 13.0 - 17.0 g/dL 8.5(L) 9.2(L) 9.1(L)  Hematocrit 39.0 - 52.0 % 24.5(L) 27.0(L) 26.6(L)  Platelets 150 - 400 K/uL 290 286 312     Most recent CMP CMP Latest Ref Rng & Units 07/18/2020 07/04/2020 07/03/2020  Glucose 65 - 99 mg/dL 114(H) 110(H) 117(H)  BUN 6 - 20 mg/dL 49(H) 36(H) 37(H)  Creatinine 0.76 - 1.27 mg/dL 5.72(H) 5.96(H) 5.92(H)  Sodium 134 - 144 mmol/L 142 140 138  Potassium 3.5 - 5.2 mmol/L 3.9 3.6 3.7  Chloride 96 - 106 mmol/L 109(H) 110 105  CO2 20 - 29 mmol/L 16(L) 21(L) 23  Calcium 8.7 - 10.2 mg/dL 8.5(L) 8.2(L) 8.0(L)  Total Protein 6.5 - 8.1 g/dL - - -  Total Bilirubin 0.3 - 1.2 mg/dL - - -  Alkaline Phos 38 - 126 U/L - - -  AST 15 - 41 U/L - - -  ALT 0 - 44 U/L - - -    Renal function CrCl cannot be calculated (Patient's most recent lab result is older than the maximum 21 days allowed.).  Hgb A1c MFr Bld (%)   Date Value  06/19/2020 7.4 (H)    LDL Cholesterol  Date Value Ref Range Status  06/22/2020 44 0 - 99 mg/dL Final    Comment:           Total Cholesterol/HDL:CHD Risk Coronary Heart Disease Risk Table                     Men   Women  1/2 Average Risk   3.4   3.3  Average Risk  5.0   4.4  2 X Average Risk   9.6   7.1  3 X Average Risk  23.4   11.0        Use the calculated Patient Ratio above and the CHD Risk Table to determine the patient's CHD Risk.        ATP III CLASSIFICATION (LDL):  <100     mg/dL   Optimal  100-129  mg/dL   Near or Above                    Optimal  130-159  mg/dL   Borderline  160-189  mg/dL   High  >190     mg/dL   Very High Performed at [facility] [address], [place], [place] [address]     Yevonne Aline. Stanford Breed, MD [facility] Phone Number: [phone] 07/07/2021 9:51 AM  Total time spent on preparing this encounter including chart review, data review, collecting history, examining the patient, coordinating care for this new patient, 45 minutes.  Portions of this report may have been transcribed using voice recognition software.  Every effort has been made to ensure accuracy; however, inadvertent computerized transcription errors may still be present.

## 2021-07-06 NOTE — Progress Notes (Signed)
[facility] [place]  ASSESSMENT / PLAN: ESPEN Evans is a 36 y.o. right handed male in need of permanent dialysis access. I reviewed options for dialysis in detail with the patient, including hemodialysis and peritoneal dialysis. I counseled the patient to ask their nephrologist about their candidacy for renal transplant. I counseled the patient that dialysis access requires surveillance and periodic maintenance. Plan to proceed with laparoscopic PD catheter placement with omentopexy.   CHIEF COMPLAINT: in need of dialysis access  HISTORY OF PRESENT ILLNESS: Adrian Evans is a 36 y.o. male referred to clinic for evaluation of dialysis access.  The patient is a well-informed, highly functional young man who has studied the options available to him.  He is working on getting a transplant through a living related donor, and seems to be making good progress.  He is right-handed.  He has never had abdominal surgery before.  He prefers peritoneal dialysis.  Past Medical History:  Diagnosis Date   Acute cerebral infarction [facility])    Acute CHF (congestive heart failure) ([place]) 05/22/2018   Acute encephalopathy 06/21/2020   Acute renal failure (ARF) ([place]) 09/14/5359   Chronic systolic congestive heart failure (HCC)    Diabetes mellitus type I ([place])    Diabetes mellitus without complication (HCC)    Dysphagia, post-stroke    Essential hypertension    Iron deficiency anemia 04/16/2018   Stage 3 chronic kidney disease (HCC)     Past Surgical History:  Procedure Laterality Date   EYE SURGERY     RIGHT/LEFT HEART CATH AND CORONARY ANGIOGRAPHY N/A 05/25/2018   Procedure: RIGHT/LEFT HEART CATH AND CORONARY ANGIOGRAPHY;  Surgeon: Corey Skains, MD;  Location: [facility];  Service: Cardiovascular;  Laterality: N/A;   TONSILLECTOMY      Family History  Problem Relation Age of Onset   Hypertension Mother     Social History   Socioeconomic History   Marital  status: Married    Spouse name: Not on file   Number of children: 2   Years of education: Not on file   Highest education level: Not on file  Occupational History   Not on file  Tobacco Use   Smoking status: Never   Smokeless tobacco: Never  Substance and Sexual Activity   Alcohol use: Not Currently   Drug use: Not Currently   Sexual activity: Not Currently  Other Topics Concern   Not on file  Social History Narrative   School teacher   Social Determinants of Health   Financial Resource Strain: Not on file  Food Insecurity: Not on file  Transportation Needs: Not on file  Physical Activity: Not on file  Stress: Not on file  Social Connections: Not on file  Intimate Partner Violence: Not on file    No Known Allergies  Current Outpatient Medications  Medication Sig Dispense Refill   acetaminophen (TYLENOL) 325 MG tablet Take 2 tablets (650 mg total) by mouth every 6 (six) hours as needed for mild pain (or Fever >/= 101). 1 tablet 0   bisacodyl (DULCOLAX) 5 MG EC tablet Take 1 tablet (5 mg total) by mouth daily as needed for moderate constipation. 30 tablet 0   Chlorhexidine Gluconate Cloth 2 % PADS Apply 6 each topically daily. 1 each 0   hydrALAZINE (APRESOLINE) 50 MG tablet Take 1 tablet (50 mg total) by mouth in the morning and at bedtime. 180 tablet 1   insulin aspart (NOVOLOG) 100 UNIT/ML FlexPen Sliding scale CBG  70 - 120: 0 units  CBG 121 - 150: 1 unit  CBG 151 - 200: 2 units  CBG 201 - 250: 3 units  CBG 251 - 300: 5 units  CBG 301 - 350: 7 units  CBG 351 - 400 9 units 15 mL 0   Insulin Pen Needle 31G X 5 MM MISC 1 Device by Does not apply route 3 (three) times daily as needed (for insulin pen). 100 each 0   sodium chloride 0.9 % infusion Inject 250 mLs into the vein continuous. 1 mL 0   carvedilol (COREG) 25 MG tablet Take 1 tablet (25 mg total) by mouth 2 (two) times daily with a meal. 60 tablet 0   furosemide (LASIX) 40 MG tablet Take 1 tablet (40 mg total) by  mouth daily as needed for fluid or edema. 30 tablet 0   insulin aspart (NOVOLOG) 100 UNIT/ML FlexPen USE AS DIRECTED PER ATTACHED SLIDING SCALE 15 mL 0   No current facility-administered medications for this visit.    PHYSICAL EXAM Vitals:   07/07/21 0821  BP: (!) 148/97  Pulse: 84  Resp: 20  Temp: 98.2 F (36.8 C)  SpO2: 97%  Weight: 211 lb (95.7 kg)  Height: 6' (1.829 m)    Constitutional: Well-appearing.  No distress. Neurologic: Cranial nerves intact.  No focal findings. Psychiatric:  Mood and affect symmetric and appropriate. Eyes:  No icterus. No conjunctival pallor. Ears, nose, throat:  mucous membranes moist. Midline trachea.  Cardiac: Regular rate and rhythm.  Respiratory:  unlabored. Abdominal:  soft, non-tender, non-distended.  Extremity: no edema. no cyanosis. no pallor.  Skin: no gangrene. no ulceration.  Lymphatic: no Stemmer's sign. no palpable lymphadenopathy.  PERTINENT LABORATORY AND RADIOLOGIC DATA  Most recent CBC CBC Latest Ref Rng & Units 06/27/2020 06/26/2020 06/25/2020  WBC 4.0 - 10.5 K/uL 10.3 10.9(H) 10.5  Hemoglobin 13.0 - 17.0 g/dL 8.5(L) 9.2(L) 9.1(L)  Hematocrit 39.0 - 52.0 % 24.5(L) 27.0(L) 26.6(L)  Platelets 150 - 400 K/uL 290 286 312     Most recent CMP CMP Latest Ref Rng & Units 07/18/2020 07/04/2020 07/03/2020  Glucose 65 - 99 mg/dL 114(H) 110(H) 117(H)  BUN 6 - 20 mg/dL 49(H) 36(H) 37(H)  Creatinine 0.76 - 1.27 mg/dL 5.72(H) 5.96(H) 5.92(H)  Sodium 134 - 144 mmol/L 142 140 138  Potassium 3.5 - 5.2 mmol/L 3.9 3.6 3.7  Chloride 96 - 106 mmol/L 109(H) 110 105  CO2 20 - 29 mmol/L 16(L) 21(L) 23  Calcium 8.7 - 10.2 mg/dL 8.5(L) 8.2(L) 8.0(L)  Total Protein 6.5 - 8.1 g/dL - - -  Total Bilirubin 0.3 - 1.2 mg/dL - - -  Alkaline Phos 38 - 126 U/L - - -  AST 15 - 41 U/L - - -  ALT 0 - 44 U/L - - -    Renal function CrCl cannot be calculated (Patient's most recent lab result is older than the maximum 21 days allowed.).  Hgb A1c MFr Bld (%)   Date Value  06/19/2020 7.4 (H)    LDL Cholesterol  Date Value Ref Range Status  06/22/2020 44 0 - 99 mg/dL Final    Comment:           Total Cholesterol/HDL:CHD Risk Coronary Heart Disease Risk Table                     Men   Women  1/2 Average Risk   3.4   3.3  Average Risk  5.0   4.4  2 X Average Risk   9.6   7.1  3 X Average Risk  23.4   11.0        Use the calculated Patient Ratio above and the CHD Risk Table to determine the patient's CHD Risk.        ATP III CLASSIFICATION (LDL):  <100     mg/dL   Optimal  100-129  mg/dL   Near or Above                    Optimal  130-159  mg/dL   Borderline  160-189  mg/dL   High  >190     mg/dL   Very High Performed at [facility] [address], [place], [place] [address]     Yevonne Aline. Stanford Breed, MD [facility] Phone Number: [phone] 07/07/2021 9:51 AM  Total time spent on preparing this encounter including chart review, data review, collecting history, examining the patient, coordinating care for this new patient, 45 minutes.  Portions of this report may have been transcribed using voice recognition software.  Every effort has been made to ensure accuracy; however, inadvertent computerized transcription errors may still be present.

## 2021-07-07 ENCOUNTER — Other Ambulatory Visit: Payer: Self-pay

## 2021-07-07 ENCOUNTER — Encounter: Payer: Self-pay | Admitting: Vascular Surgery

## 2021-07-07 ENCOUNTER — Ambulatory Visit: Payer: BC Managed Care – PPO | Admitting: Vascular Surgery

## 2021-07-07 VITALS — BP 148/97 | HR 84 | Temp 98.2°F | Resp 20 | Ht 72.0 in | Wt 211.0 lb

## 2021-07-07 DIAGNOSIS — N185 Chronic kidney disease, stage 5: Secondary | ICD-10-CM

## 2021-07-08 ENCOUNTER — Encounter: Payer: BC Managed Care – PPO | Admitting: Vascular Surgery

## 2021-07-09 ENCOUNTER — Other Ambulatory Visit: Payer: Self-pay

## 2021-07-14 ENCOUNTER — Other Ambulatory Visit: Payer: Self-pay

## 2021-07-14 ENCOUNTER — Encounter (HOSPITAL_COMMUNITY): Payer: Self-pay | Admitting: Vascular Surgery

## 2021-07-14 NOTE — Progress Notes (Addendum)
Spoke to his wife [name] with his permission PCP - Fredda Hammed at [place], [place] EKG - 04/22/21 Chest x-ray - 06/21/20 ECHO - 05/07/21 Cardiac Cath - 05/25/18 CPAP - Denies DM - per wife A1C checked and has been normal uses insulin PRN. She stated he has lost weight and no longer requiring insulin regularly.  Fasting Blood Sugar:  90- 100 average Checks Blood Sugar:  daily or every other day Aspirin Instructions: takes 81mg  will continue per VVS protocol  Anesthesia review: Yes cardiac history  -------------  SDW INSTRUCTIONS:  Your procedure is scheduled on Thursday February 23rd. Please report to [facility] Main Entrance "A" at 8:30 A.M., and check in at the Admitting office. Call this number if you have problems the morning of surgery: [phone]   Remember: Do not eat or drink anything after midnight the night before your surgery  Medications to take morning of surgery with a sip of water include: Aspirin, carvedilol (COREG) & hydrALAZINE (APRESOLINE) 50 MG tablet. IF needed: TYLENOL & insulin aspart (NOVOLOG) 1/2 of his dose  WHAT DO I DO ABOUT MY DIABETES MEDICATION?   Do not take oral diabetes medicines (pills) the morning of surgery.   If your CBG is greater than 220 mg/dL, you may take  of your sliding scale (correction) dose of insulin.   HOW TO MANAGE YOUR DIABETES BEFORE AND AFTER SURGERY  How do I manage my blood sugar before surgery? Check your blood sugar at least 4 times a day, starting 2 days before surgery, to make sure that the level is not too high or low.  Check your blood sugar the morning of your surgery when you wake up and every 2 hours until you get to the Short Stay unit.  If your blood sugar is less than 70 mg/dL, you will need to treat for low blood sugar: Do not take insulin. Treat a low blood sugar (less than 70 mg/dL) with  cup of clear juice (cranberry or apple), 4 glucose tablets, OR glucose  gel. Recheck blood sugar in 15 minutes after treatment (to make sure it is greater than 70 mg/dL). If your blood sugar is not greater than 70 mg/dL on recheck, call [phone] for further instructions. Report your blood sugar to the short stay nurse when you get to Short Stay.   As of today, STOP taking any Aspirin (unless otherwise instructed by your surgeon), Aleve, Naproxen, Ibuprofen, Motrin, Advil, Goody's, BC's, all herbal medications, fish oil, and all vitamins.    The Morning of Surgery Do not wear jewelry Do not wear lotions, powders, or colognes, or deodorant Do not bring valuables to the hospital. [facility] is not responsible for any belongings or valuables.  If you are a smoker, DO NOT Smoke 24 hours prior to surgery  If you wear a CPAP at night please bring your mask the morning of surgery   Remember that you must have someone to transport you home after your surgery, and remain with you for 24 hours if you are discharged the same day.  Please bring cases for contacts, glasses, hearing aids, dentures or bridgework because it cannot be worn into surgery.   Patients discharged the day of surgery will not be allowed to drive home.   Please shower the NIGHT BEFORE/MORNING OF SURGERY (use antibacterial soap like DIAL soap if possible). Wear comfortable clothes the morning of surgery. Oral Hygiene is also important to reduce your risk of infection.  Remember - BRUSH  YOUR TEETH THE MORNING OF SURGERY WITH YOUR REGULAR TOOTHPASTE  Patient denies shortness of breath, fever, cough and chest pain.

## 2021-07-15 ENCOUNTER — Encounter (HOSPITAL_COMMUNITY): Payer: Self-pay | Admitting: Vascular Surgery

## 2021-07-15 NOTE — Anesthesia Preprocedure Evaluation (Addendum)
Anesthesia Evaluation  Patient identified by MRN, date of birth, ID band Patient awake    Reviewed: Allergy & Precautions, NPO status , Patient's Chart, lab work & pertinent test results  Airway Mallampati: I  TM Distance: >3 FB Neck ROM: Full    Dental  (+) Teeth Intact, Dental Advisory Given   Pulmonary neg pulmonary ROS,    breath sounds clear to auscultation       Cardiovascular hypertension, Pt. on home beta blockers and Pt. on medications +CHF   Rhythm:Regular Rate:Normal  Echo: 1. Left ventricular ejection fraction, by estimation, is 30 to 35%. Left  ventricular ejection fraction by 3D volume is 31 %. The left ventricle has  moderately decreased function. The left ventricle demonstrates global  hypokinesis. The left ventricular  internal cavity size was severely dilated. Left ventricular diastolic  parameters are consistent with Grade III diastolic dysfunction  (restrictive). The average left ventricular global longitudinal strain is  -5.9 %. The global longitudinal strain is  abnormal.  2. Right ventricular systolic function is mildly reduced. The right  ventricular size is moderately enlarged.  3. Left atrial size was mildly dilated.  4. Right atrial size was mildly dilated.  5. The mitral valve is normal in structure. Mild mitral valve  regurgitation.  6. Tricuspid valve regurgitation is moderate.  7. The aortic valve is tricuspid. Aortic valve regurgitation is mild. No  aortic stenosis is present.    Neuro/Psych CVA, Residual Symptoms negative psych ROS   GI/Hepatic negative GI ROS, Neg liver ROS,   Endo/Other  diabetes, Type 2, Insulin Dependent  Renal/GU ESRF and DialysisRenal disease     Musculoskeletal negative musculoskeletal ROS (+)   Abdominal Normal abdominal exam  (+)   Peds  Hematology negative hematology ROS (+)   Anesthesia Other Findings   Reproductive/Obstetrics                            Anesthesia Physical Anesthesia Plan  ASA: 3  Anesthesia Plan: General   Post-op Pain Management:    Induction: Intravenous  PONV Risk Score and Plan: 3 and Ondansetron, Midazolam and Treatment may vary due to age or medical condition  Airway Management Planned: Oral ETT  Additional Equipment: None  Intra-op Plan:   Post-operative Plan: Extubation in OR  Informed Consent:   Plan Discussed with: CRNA  Anesthesia Plan Comments: (PAT note by Karoline Caldwell, PA-C: Patient recently seen by cardiology as part of evaluation for kidney transplant.  He has been evaluated for transplant at [facility].  History of HFrEF, HTN, CVA, type 1 diabetes  Seen by Laurann Montana, NP on 04/22/2021.  Nuclear stress and echocardiogram were ordered.  Stress test 05/07/2021 showed no evidence of ischemia, there was a fixed defect in the inferior wall.  The study was high risk due to severely depressed LV function.  Echocardiogram 05/07/2021 showed EF 30 to 35%, mildly reduced RV systolic function, mild MR, moderate TR.  Of note, patient also had a cath in 2020 that showed no obstructive disease.  ESRD on HD via Right TDC.  Patient will need day of surgery labs and evaluation.  EKG 04/22/2021: NSR.  Rate 73.  Left anterior fascicular block.  T wave abnormality, consider lateral ischemia.  T wave inversion in V6 felt to be stable.  TTE 05/07/2021: 1. Left ventricular ejection fraction, by estimation, is 30 to 35%. Left  ventricular ejection fraction by 3D volume is 31 %. The left ventricle  has  moderately decreased function. The left ventricle demonstrates global  hypokinesis. The left ventricular  internal cavity size was severely dilated. Left ventricular diastolic  parameters are consistent with Grade III diastolic dysfunction  (restrictive). The average left ventricular global longitudinal strain is  -5.9 %. The global longitudinal strain is  abnormal.  2.  Right ventricular systolic function is mildly reduced. The right  ventricular size is moderately enlarged.  3. Left atrial size was mildly dilated.  4. Right atrial size was mildly dilated.  5. The mitral valve is normal in structure. Mild mitral valve  regurgitation.  6. Tricuspid valve regurgitation is moderate.  7. The aortic valve is tricuspid. Aortic valve regurgitation is mild. No  aortic stenosis is present.   Nuclear stress 05/07/2021:  Findings are consistent with prior myocardial infarction and no prior ischemia. The study is high risk.  No ST deviation was noted.  Left ventricular function is abnormal. Nuclear stress EF: 19 %. The left ventricular ejection fraction is severely decreased (<30%). End diastolic cavity size is severely enlarged.  Prior study not available for comparison.  There is a fixed defect in the inferior wall. He had a cardiac cath in 2020 that showed no obstructive CAD.  The study is a high risk study based on his severely depressed LV function    Cath 05/25/2018: Assessment The patient has had Acutesystolicdysfunction congestive heart failure with New York Heart Association Class IVsymptoms.  reduced left ventricular function with ejection fraction of 20%  Pulmonary capillary wedge pressures with moderate elevation. moderate pulmonary hypertension  normalcoronary arteries with no3vessel arterial disease and up to 0% stenosis  Plan Aggressive medical management of congestive heart failure with appropriate use of beta blockers, ACE inhibitors, and diuretics. Congestive heart failure education and rehabilitation have been recommended.   )       Anesthesia Quick Evaluation

## 2021-07-15 NOTE — Progress Notes (Signed)
Anesthesia Chart Review:  Patient recently seen by cardiology as part of evaluation for kidney transplant.  He has been evaluated for transplant at [facility].  History of HFrEF, HTN, CVA, type 1 diabetes  Seen by Laurann Montana, NP on 04/22/2021.  Nuclear stress and echocardiogram were ordered.  Stress test 05/07/2021 showed no evidence of ischemia, there was a fixed defect in the inferior wall.  The study was high risk due to severely depressed LV function.  Echocardiogram 05/07/2021 showed EF 30 to 35%, mildly reduced RV systolic function, mild MR, moderate TR.  Of note, patient also had a cath in 2020 that showed no obstructive disease.  ESRD on HD via Right TDC.  Patient will need day of surgery labs and evaluation.  EKG 04/22/2021: NSR.  Rate 73.  Left anterior fascicular block.  T wave abnormality, consider lateral ischemia.  T wave inversion in V6 felt to be stable.  TTE 05/07/2021:  1. Left ventricular ejection fraction, by estimation, is 30 to 35%. Left  ventricular ejection fraction by 3D volume is 31 %. The left ventricle has  moderately decreased function. The left ventricle demonstrates global  hypokinesis. The left ventricular  internal cavity size was severely dilated. Left ventricular diastolic  parameters are consistent with Grade III diastolic dysfunction  (restrictive). The average left ventricular global longitudinal strain is  -5.9 %. The global longitudinal strain is  abnormal.   2. Right ventricular systolic function is mildly reduced. The right  ventricular size is moderately enlarged.   3. Left atrial size was mildly dilated.   4. Right atrial size was mildly dilated.   5. The mitral valve is normal in structure. Mild mitral valve  regurgitation.   6. Tricuspid valve regurgitation is moderate.   7. The aortic valve is tricuspid. Aortic valve regurgitation is mild. No  aortic stenosis is present.   Nuclear stress 05/07/2021:   Findings are consistent with prior  myocardial infarction and no prior ischemia. The study is high risk.   No ST deviation was noted.   Left ventricular function is abnormal. Nuclear stress EF: 19 %. The left ventricular ejection fraction is severely decreased (<30%). End diastolic cavity size is severely enlarged.   Prior study not available for comparison.   There is a fixed defect in the inferior wall.   He had a cardiac cath in 2020 that showed no obstructive CAD.   The study is a high risk study based on his severely depressed LV function    Cath 05/25/2018: Assessment The patient has had Acute systolic dysfunction congestive heart failure with New York Heart Association Class IV symptoms.   reduced left ventricular function with ejection fraction of 20%   Pulmonary capillary wedge pressures with moderate elevation. moderate pulmonary hypertension   normal coronary arteries with no 3 vessel arterial disease and up to 0% stenosis   Plan Aggressive medical management of congestive heart failure with appropriate use of beta blockers, ACE inhibitors, and diuretics. Congestive heart failure education and rehabilitation have been recommended.     Wynonia Musty [facility] Short Stay Center/Anesthesiology Phone [phone] 07/15/2021 10:55 AM

## 2021-07-16 ENCOUNTER — Encounter (HOSPITAL_COMMUNITY)
Admission: RE | Disposition: A | Discharge status: Home / Self Care | Payer: Self-pay | Source: Home / Self Care | Attending: Vascular Surgery

## 2021-07-16 ENCOUNTER — Other Ambulatory Visit: Payer: Self-pay

## 2021-07-16 ENCOUNTER — Ambulatory Visit (HOSPITAL_COMMUNITY)
Admission: RE | Admit: 2021-07-16 | Discharge: 2021-07-16 | Disposition: A | Discharge status: Home / Self Care | Payer: BC Managed Care – PPO | Attending: Vascular Surgery | Admitting: Vascular Surgery

## 2021-07-16 ENCOUNTER — Ambulatory Visit (HOSPITAL_COMMUNITY): Payer: BC Managed Care – PPO | Admitting: Vascular Surgery

## 2021-07-16 ENCOUNTER — Encounter (HOSPITAL_COMMUNITY): Payer: Self-pay | Admitting: Vascular Surgery

## 2021-07-16 DIAGNOSIS — R188 Other ascites: Secondary | ICD-10-CM | POA: Diagnosis not present

## 2021-07-16 DIAGNOSIS — I5022 Chronic systolic (congestive) heart failure: Secondary | ICD-10-CM | POA: Insufficient documentation

## 2021-07-16 DIAGNOSIS — Z5309 Procedure and treatment not carried out because of other contraindication: Secondary | ICD-10-CM | POA: Insufficient documentation

## 2021-07-16 DIAGNOSIS — Z8673 Personal history of transient ischemic attack (TIA), and cerebral infarction without residual deficits: Secondary | ICD-10-CM | POA: Insufficient documentation

## 2021-07-16 DIAGNOSIS — N186 End stage renal disease: Secondary | ICD-10-CM | POA: Diagnosis present

## 2021-07-16 DIAGNOSIS — K769 Liver disease, unspecified: Secondary | ICD-10-CM | POA: Insufficient documentation

## 2021-07-16 DIAGNOSIS — E1022 Type 1 diabetes mellitus with diabetic chronic kidney disease: Secondary | ICD-10-CM | POA: Insufficient documentation

## 2021-07-16 DIAGNOSIS — Z794 Long term (current) use of insulin: Secondary | ICD-10-CM | POA: Diagnosis not present

## 2021-07-16 DIAGNOSIS — I272 Pulmonary hypertension, unspecified: Secondary | ICD-10-CM | POA: Diagnosis not present

## 2021-07-16 DIAGNOSIS — Z79899 Other long term (current) drug therapy: Secondary | ICD-10-CM | POA: Diagnosis not present

## 2021-07-16 DIAGNOSIS — N185 Chronic kidney disease, stage 5: Secondary | ICD-10-CM | POA: Diagnosis not present

## 2021-07-16 DIAGNOSIS — I132 Hypertensive heart and chronic kidney disease with heart failure and with stage 5 chronic kidney disease, or end stage renal disease: Secondary | ICD-10-CM | POA: Insufficient documentation

## 2021-07-16 HISTORY — PX: CAPD INSERTION: SHX5233

## 2021-07-16 HISTORY — DX: End stage renal disease: N18.6

## 2021-07-16 LAB — HEPATIC FUNCTION PANEL
ALT: 17 U/L (ref 0–44)
AST: 17 U/L (ref 15–41)
Albumin: 3.3 g/dL — ABNORMAL LOW (ref 3.5–5.0)
Alkaline Phosphatase: 67 U/L (ref 38–126)
Bilirubin, Direct: 0.3 mg/dL — ABNORMAL HIGH (ref 0.0–0.2)
Indirect Bilirubin: 0.9 mg/dL (ref 0.3–0.9)
Total Bilirubin: 1.2 mg/dL (ref 0.3–1.2)
Total Protein: 6.4 g/dL — ABNORMAL LOW (ref 6.5–8.1)

## 2021-07-16 LAB — POCT I-STAT, CHEM 8
BUN: 55 mg/dL — ABNORMAL HIGH (ref 6–20)
Calcium, Ion: 1.09 mmol/L — ABNORMAL LOW (ref 1.15–1.40)
Chloride: 107 mmol/L (ref 98–111)
Creatinine, Ser: 6.3 mg/dL — ABNORMAL HIGH (ref 0.61–1.24)
Glucose, Bld: 137 mg/dL — ABNORMAL HIGH (ref 70–99)
HCT: 32 % — ABNORMAL LOW (ref 39.0–52.0)
Hemoglobin: 10.9 g/dL — ABNORMAL LOW (ref 13.0–17.0)
Potassium: 4.8 mmol/L (ref 3.5–5.1)
Sodium: 138 mmol/L (ref 135–145)
TCO2: 23 mmol/L (ref 22–32)

## 2021-07-16 LAB — GLUCOSE, CAPILLARY
Glucose-Capillary: 141 mg/dL — ABNORMAL HIGH (ref 70–99)
Glucose-Capillary: 122 mg/dL — ABNORMAL HIGH (ref 70–99)
Glucose-Capillary: 110 mg/dL — ABNORMAL HIGH (ref 70–99)

## 2021-07-16 LAB — PROTIME-INR
INR: 1.2 (ref 0.8–1.2)
Prothrombin Time: 15.4 seconds — ABNORMAL HIGH (ref 11.4–15.2)

## 2021-07-16 LAB — HEPATITIS B SURFACE ANTIGEN: Hepatitis B Surface Ag: NONREACTIVE

## 2021-07-16 SURGERY — LAPAROSCOPIC INSERTION CONTINUOUS AMBULATORY PERITONEAL DIALYSIS  (CAPD) CATHETER
Anesthesia: General | Site: Abdomen

## 2021-07-16 MED ORDER — DEXAMETHASONE SODIUM PHOSPHATE 10 MG/ML IJ SOLN
INTRAMUSCULAR | Status: AC
  Filled 2021-07-16: qty 1

## 2021-07-16 MED ORDER — PHENYLEPHRINE 40 MCG/ML (10ML) SYRINGE FOR IV PUSH (FOR BLOOD PRESSURE SUPPORT)
PREFILLED_SYRINGE | INTRAVENOUS | Status: DC | PRN
  Administered 2021-07-16: 40 ug via INTRAVENOUS

## 2021-07-16 MED ORDER — LABETALOL HCL 5 MG/ML IV SOLN
INTRAVENOUS | Status: AC
  Administered 2021-07-16: 5 mg via INTRAVENOUS
  Filled 2021-07-16: qty 4

## 2021-07-16 MED ORDER — SUGAMMADEX SODIUM 200 MG/2ML IV SOLN
INTRAVENOUS | Status: DC | PRN
Start: 2021-07-16 — End: 2021-07-16
  Administered 2021-07-16: 200 mg via INTRAVENOUS

## 2021-07-16 MED ORDER — CEFAZOLIN SODIUM-DEXTROSE 2-4 GM/100ML-% IV SOLN
INTRAVENOUS | Status: AC
  Filled 2021-07-16: qty 100

## 2021-07-16 MED ORDER — PROPOFOL 10 MG/ML IV BOLUS
INTRAVENOUS | Status: DC | PRN
Start: 2021-07-16 — End: 2021-07-16
  Administered 2021-07-16: 140 mg via INTRAVENOUS

## 2021-07-16 MED ORDER — OXYCODONE HCL 5 MG PO TABS: 5.0000 mg | ORAL_TABLET | Freq: Three times a day (TID) | ORAL | 0 refills | Status: DC | PRN

## 2021-07-16 MED ORDER — PROPOFOL 10 MG/ML IV BOLUS
INTRAVENOUS | Status: AC
  Filled 2021-07-16: qty 20

## 2021-07-16 MED ORDER — LIDOCAINE 2% (20 MG/ML) 5 ML SYRINGE
INTRAMUSCULAR | Status: DC | PRN
  Administered 2021-07-16: 50 mg via INTRAVENOUS

## 2021-07-16 MED ORDER — CHLORHEXIDINE GLUCONATE 4 % EX LIQD: 60.0000 mL | Freq: Once | CUTANEOUS | Status: DC

## 2021-07-16 MED ORDER — HEPARIN 6000 UNIT IRRIGATION SOLUTION
Status: DC | PRN
  Administered 2021-07-16: 1

## 2021-07-16 MED ORDER — SODIUM CHLORIDE 0.9 % IV SOLN: INTRAVENOUS | Status: DC

## 2021-07-16 MED ORDER — INSULIN ASPART 100 UNIT/ML IJ SOLN: 0.0000 [IU] | INTRAMUSCULAR | Status: DC | PRN

## 2021-07-16 MED ORDER — CHLORHEXIDINE GLUCONATE 0.12 % MT SOLN
OROMUCOSAL | Status: AC
  Administered 2021-07-16: 15 mL via OROMUCOSAL
  Filled 2021-07-16: qty 15

## 2021-07-16 MED ORDER — ALBUMIN HUMAN 5 % IV SOLN: INTRAVENOUS | Status: DC | PRN

## 2021-07-16 MED ORDER — FENTANYL CITRATE (PF) 100 MCG/2ML IJ SOLN
INTRAMUSCULAR | Status: DC | PRN
  Administered 2021-07-16: 100 ug via INTRAVENOUS

## 2021-07-16 MED ORDER — MIDAZOLAM HCL 2 MG/2ML IJ SOLN
INTRAMUSCULAR | Status: AC
  Filled 2021-07-16: qty 2

## 2021-07-16 MED ORDER — DEXAMETHASONE SODIUM PHOSPHATE 10 MG/ML IJ SOLN
INTRAMUSCULAR | Status: DC | PRN
  Administered 2021-07-16: 5 mg via INTRAVENOUS

## 2021-07-16 MED ORDER — CEFAZOLIN SODIUM-DEXTROSE 2-4 GM/100ML-% IV SOLN
2.0000 g | INTRAVENOUS | Status: AC
  Administered 2021-07-16: 2 g via INTRAVENOUS

## 2021-07-16 MED ORDER — PHENYLEPHRINE 40 MCG/ML (10ML) SYRINGE FOR IV PUSH (FOR BLOOD PRESSURE SUPPORT)
PREFILLED_SYRINGE | INTRAVENOUS | Status: AC
  Filled 2021-07-16: qty 10

## 2021-07-16 MED ORDER — ROCURONIUM BROMIDE 10 MG/ML (PF) SYRINGE
PREFILLED_SYRINGE | INTRAVENOUS | Status: DC | PRN
  Administered 2021-07-16: 10 mg via INTRAVENOUS
  Administered 2021-07-16: 50 mg via INTRAVENOUS

## 2021-07-16 MED ORDER — CHLORHEXIDINE GLUCONATE 0.12 % MT SOLN: 15.0000 mL | Freq: Once | OROMUCOSAL | Status: AC

## 2021-07-16 MED ORDER — SODIUM CHLORIDE 0.9 % IR SOLN
Status: DC | PRN
  Administered 2021-07-16: 1000 mL

## 2021-07-16 MED ORDER — ORAL CARE MOUTH RINSE: 15.0000 mL | Freq: Once | OROMUCOSAL | Status: AC

## 2021-07-16 MED ORDER — ROCURONIUM BROMIDE 10 MG/ML (PF) SYRINGE
PREFILLED_SYRINGE | INTRAVENOUS | Status: AC
  Filled 2021-07-16: qty 10

## 2021-07-16 MED ORDER — LABETALOL HCL 5 MG/ML IV SOLN: 5.0000 mg | Freq: Once | INTRAVENOUS | Status: AC

## 2021-07-16 MED ORDER — SODIUM CHLORIDE 0.9 % IV SOLN
INTRAVENOUS | Status: DC
Start: 2021-07-16 — End: 2021-07-16

## 2021-07-16 MED ORDER — ONDANSETRON HCL 4 MG/2ML IJ SOLN
INTRAMUSCULAR | Status: AC
  Filled 2021-07-16: qty 2

## 2021-07-16 MED ORDER — HEPARIN 6000 UNIT IRRIGATION SOLUTION
Status: AC
  Filled 2021-07-16: qty 500

## 2021-07-16 MED ORDER — LIDOCAINE 2% (20 MG/ML) 5 ML SYRINGE
INTRAMUSCULAR | Status: AC
  Filled 2021-07-16: qty 5

## 2021-07-16 MED ORDER — ONDANSETRON HCL 4 MG/2ML IJ SOLN
INTRAMUSCULAR | Status: DC | PRN
Start: 2021-07-16 — End: 2021-07-16
  Administered 2021-07-16: 4 mg via INTRAVENOUS

## 2021-07-16 MED ORDER — MIDAZOLAM HCL 2 MG/2ML IJ SOLN
INTRAMUSCULAR | Status: DC | PRN
Start: 2021-07-16 — End: 2021-07-16
  Administered 2021-07-16: 2 mg via INTRAVENOUS

## 2021-07-16 MED ORDER — FENTANYL CITRATE (PF) 250 MCG/5ML IJ SOLN
INTRAMUSCULAR | Status: AC
  Filled 2021-07-16: qty 5

## 2021-07-16 SURGICAL SUPPLY — 50 items
ADAPTER TITANIUM MEDIONICS (MISCELLANEOUS) ×1 IMPLANT
BAG DECANTER FOR FLEXI CONT (MISCELLANEOUS) ×3 IMPLANT
BIOPATCH RED 1 DISK 7.0 (GAUZE/BANDAGES/DRESSINGS) ×3 IMPLANT
BLADE 11 SAFETY STRL DISP (BLADE) ×3 IMPLANT
BLADE CLIPPER SURG (BLADE) IMPLANT
CATH EXTENDED DIALYSIS (CATHETERS) IMPLANT
CHLORAPREP W/TINT 26 (MISCELLANEOUS) ×3 IMPLANT
COVER SURGICAL LIGHT HANDLE (MISCELLANEOUS) ×3 IMPLANT
DECANTER SPIKE VIAL GLASS SM (MISCELLANEOUS) ×3 IMPLANT
DERMABOND ADHESIVE PROPEN (GAUZE/BANDAGES/DRESSINGS) ×1
DERMABOND ADVANCED (GAUZE/BANDAGES/DRESSINGS) ×1
DERMABOND ADVANCED .7 DNX12 (GAUZE/BANDAGES/DRESSINGS) ×2 IMPLANT
DERMABOND ADVANCED .7 DNX6 (GAUZE/BANDAGES/DRESSINGS) ×1 IMPLANT
ELECT REM PT RETURN 9FT ADLT (ELECTROSURGICAL) ×3
ELECTRODE REM PT RTRN 9FT ADLT (ELECTROSURGICAL) ×2 IMPLANT
GAUZE SPONGE 4X4 12PLY STRL (GAUZE/BANDAGES/DRESSINGS) ×3 IMPLANT
GLOVE SURG POLYISO LF SZ8 (GLOVE) ×3 IMPLANT
GOWN STRL REUS W/ TWL LRG LVL3 (GOWN DISPOSABLE) ×6 IMPLANT
GOWN STRL REUS W/ TWL XL LVL3 (GOWN DISPOSABLE) ×2 IMPLANT
GOWN STRL REUS W/TWL LRG LVL3 (GOWN DISPOSABLE) ×3
GOWN STRL REUS W/TWL XL LVL3 (GOWN DISPOSABLE) ×1
GRASPER SUT TROCAR 14GX15 (MISCELLANEOUS) ×3 IMPLANT
IRRIGATION STRYKERFLOW (MISCELLANEOUS) ×1 IMPLANT
IRRIGATOR STRYKERFLOW (MISCELLANEOUS) ×3
IV NS 1000ML (IV SOLUTION) ×1
IV NS 1000ML BAXH (IV SOLUTION) ×2 IMPLANT
KIT BASIN OR (CUSTOM PROCEDURE TRAY) ×3 IMPLANT
KIT TURNOVER KIT B (KITS) ×3 IMPLANT
NDL INSUFFLATION 14GA 120MM (NEEDLE) ×1 IMPLANT
NEEDLE INSUFFLATION 14GA 120MM (NEEDLE) ×3 IMPLANT
NS IRRIG 1000ML POUR BTL (IV SOLUTION) ×3 IMPLANT
PAD ARMBOARD 7.5X6 YLW CONV (MISCELLANEOUS) ×6 IMPLANT
SCISSORS LAP 5X35 DISP (ENDOMECHANICALS) IMPLANT
SET CYSTO W/LG BORE CLAMP LF (SET/KITS/TRAYS/PACK) ×3 IMPLANT
SET TUBE SMOKE EVAC HIGH FLOW (TUBING) ×3 IMPLANT
SLEEVE ENDOPATH XCEL 5M (ENDOMECHANICALS) ×3 IMPLANT
STYLET FALLER (MISCELLANEOUS) ×1 IMPLANT
SUT MNCRL AB 4-0 PS2 18 (SUTURE) ×6 IMPLANT
SUT PROLENE 0 SH 30 (SUTURE) ×6 IMPLANT
SUT VIC AB 3-0 SH 27 (SUTURE)
SUT VIC AB 3-0 SH 27X BRD (SUTURE) IMPLANT
SYR 20CC LL (SYRINGE) ×3 IMPLANT
TAPE CLOTH SURG 4X10 WHT LF (GAUZE/BANDAGES/DRESSINGS) ×3 IMPLANT
TOWEL GREEN STERILE (TOWEL DISPOSABLE) ×3 IMPLANT
TOWEL GREEN STERILE FF (TOWEL DISPOSABLE) ×3 IMPLANT
TRAP SPECIMEN MUCUS 40CC (MISCELLANEOUS) ×2 IMPLANT
TRAY LAPAROSCOPIC MC (CUSTOM PROCEDURE TRAY) ×3 IMPLANT
TROCAR 5MMX150MM (TROCAR) ×3 IMPLANT
TROCAR XCEL NON-BLD 5MMX100MML (ENDOMECHANICALS) ×3 IMPLANT
WATER STERILE IRR 1000ML POUR (IV SOLUTION) ×3 IMPLANT

## 2021-07-16 NOTE — Discharge Instructions (Signed)
Diagnostic Laparoscopy, Care After The following information offers guidance on how to care for yourself after your procedure. Your health care provider may also give you more specific instructions. If you have problems or questions, contact your health care provider. What can I expect after the procedure? After the procedure, it is common to have: Mild discomfort in the abdomen. Sore throat. Women who have laparoscopy with a pelvic examination may have mild cramping and fluid coming from the vagina for a few days after the procedure. Follow these instructions at home: Medicines Take over-the-counter and prescription medicines only as told by your health care provider. If you were prescribed an antibiotic medicine, take it as told by your health care provider. Do not stop taking the antibiotic even if you start to feel better. Ask your health care provider if the medicine prescribed to you: Requires you to avoid driving or using machinery. Can cause constipation. You may need to take these actions to prevent or treat constipation: Drink enough fluid to keep your urine pale yellow. Take over-the-counter or prescription medicines. Eat foods that are high in fiber, such as beans, whole grains, and fresh fruits and vegetables. Limit foods that are high in fat and processed sugars, such as fried or sweet foods. Incision care  Follow instructions from your health care provider about how to take care of your incisions. Make sure you: Wash your hands with soap and water for at least 20 seconds before and after you change your bandage (dressing). If soap and water are not available, use hand sanitizer. Change your dressing as told by your health care provider. Leave stitches (sutures), skin glue, or surgical tape in place. These skin closures may need to stay in place for 2 weeks or longer. If surgical tape edges start to loosen and curl up, you may trim the loose edges. Do not remove the surgical tape  completely unless your health care provider tells you to do that. Check your incision areas every day for signs of infection. Check for: Redness, swelling, or pain. Fluid or blood. Warmth. Pus or a bad smell. Activity Return to your normal activities as told by your health care provider. Ask your health care provider what activities are safe for you. Do not lift anything that is heavier than 10 lb (4.5 kg), or the limit that you are told, until your health care provider says that it is safe. Avoid sitting for a long time without moving. Get up to take short walks every 1-2 hours. This is important to improve blood flow and breathing. Ask for help if you feel weak or unsteady. General instructions Do not use any products that contain nicotine or tobacco. These products include cigarettes, chewing tobacco, and vaping devices, such as e-cigarettes. If you need help quitting, ask your health care provider. If you were given a sedative during the procedure, it can affect you for several hours. Do not drive or operate machinery until your health care provider says that it is safe. Do not take baths, swim, or use a hot tub until your health care provider approves. Ask your health care provider if you may take showers. You may only be allowed to take sponge baths. Keep all follow-up visits. This is important. Contact a health care provider if: You develop shoulder pain. You feel light-headed or faint. You are unable to pass gas or have a bowel movement. You feel nauseous or you vomit. You develop a rash. You have any of these signs of infection:  Redness, swelling, or pain around an incision. Fluid or blood coming from an incision. Warmth coming from an incision. Pus or a bad smell coming from an incision. A fever or chills. Get help right away if: You have severe pain. You have vomiting that does not go away. You have heavy bleeding from the vagina. Any incision opens up. You have trouble  breathing. You have chest pain. These symptoms may represent a serious problem that is an emergency. Do not wait to see if the symptoms will go away. Get medical help right away. Call your local emergency services (911 in the [place] Do not drive yourself to the hospital. Summary After the procedure, it is common to have mild discomfort in the abdomen and a sore throat. Check your incision areas every day for signs of infection. Return to your normal activities as told by your health care provider. Ask your health care provider what activities are safe for you. This information is not intended to replace advice given to you by your health care provider. Make sure you discuss any questions you have with your health care provider.

## 2021-07-16 NOTE — Interval H&P Note (Signed)
History and Physical Interval Note:  07/16/2021 11:10 AM  Adrian Evans  has presented today for surgery, with the diagnosis of CKD V.  The various methods of treatment have been discussed with the patient and family. After consideration of risks, benefits and other options for treatment, the patient has consented to  Procedure(s): [place]  (CAPD) CATHETER (N/A) Possible, LAPAROSCOPIC OMENTOPEXY/ (N/A) as a surgical intervention.  The patient's history has been reviewed, patient examined, no change in status, stable for surgery.  I have reviewed the patient's chart and labs.  Questions were answered to the patient's satisfaction.     Cherre Robins

## 2021-07-16 NOTE — Op Note (Signed)
DATE OF SERVICE: 07/16/2021  PATIENT:  Adrian Evans  36 y.o. male  PRE-OPERATIVE DIAGNOSIS:  ESRD  POST-OPERATIVE DIAGNOSIS:  Same, possible hepatorenal syndrome  PROCEDURE:   Diagnostic laparoscopy with peritoneal aspiration  SURGEON:  Surgeon(s) and Role:    * Cherre Robins, MD - Primary  ASSISTANT: Leontine Locket, PA-C  An assistant was required to facilitate exposure and expedite the case.  ANESTHESIA:   general  EBL: minimal  BLOOD ADMINISTERED:none  DRAINS: none   LOCAL MEDICATIONS USED:  NONE  SPECIMEN:  peritoneal aspirate for cytology  COUNTS: confirmed correct.  TOURNIQUET:  none  PATIENT DISPOSITION:  PACU - hemodynamically stable.   Delay start of Pharmacological VTE agent (>24hrs) due to surgical blood loss or risk of bleeding: no  INDICATION FOR PROCEDURE: [name] is a 36 y.o. male with ESRD. He desires transition to peritoneal dialysis. After careful discussion of risks, benefits, and alternatives the patient was offered laparoscopic peritoneal dialysis catheter placement. The patient understood and wished to proceed.  OPERATIVE FINDINGS: hepatomegaly with bilious ascites. I discussed the case with Dr. Ninfa Linden. We felt it too high risk for continuous bilious leak to place a PD catheter. I aborted the procedure here.  DESCRIPTION OF PROCEDURE: After identification of the patient in the pre-operative holding area, the patient was transferred to the operating room. The patient was positioned supine on the operating room table. Anesthesia was induced. The abdomen was prepped and draped in standard fashion. A surgical pause was performed confirming correct patient, procedure, and operative location.  A Veress needle was introduced at Palmer's point.  A saline drop test was performed to confirm intra-abdominal position.  Insufflation tubing was connected to the needle.  Good flow and pressure was noted.  The abdomen and insufflated without  difficulty.  Using VasoView technique a right upper quadrant 5 mm trocar was placed under direct vision with the 0 degree scope.  After entering the abdomen I confirmed there was no evidence of Veress needle injury.  An additional 5 mm right upper quadrant trocar was placed under direct visualization with a 30 degree scope.    A large liver was encountered. The liver appeared grossly diseased. A large volume of bilious ascites was noted. 469mL of amber, frothy fluid was evacuated with a suction evacuator. I sent the fluid sample for cytology. I discussed the case with Dr. Ninfa Linden. We both felt it unsafe to proceed with PD catheter placement given the risk of continuous ascites leak from the catheter exit site. We ended the case here. Pneumoperitoneum was evacuated. The trocars were removed. Incisions closed with 4-O monocryl and dermabond.  Upon completion of the case instrument and sharps counts were confirmed correct. The patient was transferred to the PACU in good condition. I was present for all portions of the procedure.  Yevonne Aline. Stanford Breed, MD [facility] Phone Number: [phone] 07/16/2021 12:12 PM

## 2021-07-16 NOTE — Anesthesia Procedure Notes (Signed)
Procedure Name: Intubation Date/Time: 07/16/2021 11:27 AM Performed by: Barrington Ellison, CRNA Pre-anesthesia Checklist: Patient identified, Emergency Drugs available, Suction available and Patient being monitored Patient Re-evaluated:Patient Re-evaluated prior to induction Oxygen Delivery Method: Circle System Utilized Preoxygenation: Pre-oxygenation with 100% oxygen Induction Type: IV induction Ventilation: Mask ventilation without difficulty Laryngoscope Size: Mac and 4 Grade View: Grade I Tube type: Oral Tube size: 7.5 mm Number of attempts: 1 Airway Equipment and Method: Stylet and Oral airway Placement Confirmation: ETT inserted through vocal cords under direct vision, positive ETCO2 and breath sounds checked- equal and bilateral Secured at: 22 cm Tube secured with: Tape Dental Injury: Teeth and Oropharynx as per pre-operative assessment

## 2021-07-16 NOTE — Anesthesia Postprocedure Evaluation (Signed)
Anesthesia Post Note  Patient: Adrian Evans  Procedure(s) Performed: Diagnostic Laparoscopy (Abdomen)     Patient location during evaluation: PACU Anesthesia Type: General Level of consciousness: awake and alert Pain management: pain level controlled Vital Signs Assessment: post-procedure vital signs reviewed and stable Respiratory status: spontaneous breathing, nonlabored ventilation, respiratory function stable and patient connected to nasal cannula oxygen Cardiovascular status: blood pressure returned to baseline and stable Postop Assessment: no apparent nausea or vomiting Anesthetic complications: no   No notable events documented.  Last Vitals:  Vitals:   07/16/21 1237 07/16/21 1252  BP: (!) 128/98 (!) 150/94  Pulse: 69 70  Resp: (!) 8 12  Temp:  36.7 C  SpO2: 98% 100%    Last Pain:  Vitals:   07/16/21 1252  TempSrc:   PainSc: 0-No pain                 Effie Berkshire

## 2021-07-16 NOTE — Progress Notes (Signed)
Patient BP 160/106 upon arrival to Short Stay. Recheck at 0900= 162/108. Cuff switched to left arm and BP reading= 168/113. Dr. Smith Robert made aware. Verbal order received for Labetalol 5 mg.

## 2021-07-16 NOTE — Transfer of Care (Signed)
Immediate Anesthesia Transfer of Care Note  Patient: Adrian Evans  Procedure(s) Performed: Diagnostic Laparoscopy (Abdomen)  Patient Location: PACU  Anesthesia Type:General  Level of Consciousness: drowsy and patient cooperative  Airway & Oxygen Therapy: Patient Spontanous Breathing and Patient connected to nasal cannula oxygen  Post-op Assessment: Report given to RN  Post vital signs: Reviewed and stable  Last Vitals:  Vitals Value Taken Time  BP 129/90 07/16/21 1222  Temp    Pulse 70 07/16/21 1223  Resp 15 07/16/21 1223  SpO2 88 % 07/16/21 1223  Vitals shown include unvalidated device data.  Last Pain:  Vitals:   07/16/21 0847  TempSrc:   PainSc: 0-No pain      Patients Stated Pain Goal: 0 (41/75/30 1040)  Complications: No notable events documented.

## 2021-07-17 ENCOUNTER — Encounter (HOSPITAL_COMMUNITY): Payer: Self-pay | Admitting: Vascular Surgery

## 2021-07-17 LAB — HEPATITIS B SURFACE ANTIBODY, QUANTITATIVE: Hep B S AB Quant (Post): >1000 m[IU]/mL (ref >9.9)

## 2021-07-17 LAB — HCV AB W REFLEX TO QUANT PCR: HCV Ab: NONREACTIVE

## 2021-07-17 LAB — HCV INTERPRETATION

## 2021-07-17 LAB — CYTOLOGY - NON PAP

## 2021-07-21 ENCOUNTER — Ambulatory Visit (HOSPITAL_BASED_OUTPATIENT_CLINIC_OR_DEPARTMENT_OTHER): Payer: BC Managed Care – PPO | Admitting: Family

## 2021-08-21 ENCOUNTER — Other Ambulatory Visit: Payer: Self-pay | Admitting: *Deleted

## 2021-08-21 DIAGNOSIS — N185 Chronic kidney disease, stage 5: Secondary | ICD-10-CM

## 2021-08-24 NOTE — Progress Notes (Signed)
[facility] [place] ? ?ASSESSMENT / PLAN: ?Adrian Evans is a 36 y.o. right handed male in need of permanent dialysis access. I reviewed options for dialysis in detail with the patient, including hemodialysis and peritoneal dialysis. I counseled the patient to ask their nephrologist about their candidacy for renal transplant. I counseled the patient that dialysis access requires surveillance and periodic maintenance. The patient wants to proceed as quickly as possible. My next available appointment is in 2 weeks. I offered the patient a left radiocephalic arteriovenous fistula with Dr. Carlis Abbott 4/12.  ? ?CHIEF COMPLAINT: in need of dialysis access ? ?HISTORY OF PRESENT ILLNESS: ?Adrian Evans is a 36 y.o. male referred to clinic for evaluation of dialysis access.  The patient is a well-informed, highly functional young man who has studied the options available to him.  He is working on getting a transplant through a living related donor, and seems to be making good progress.  He is right-handed.  He has never had abdominal surgery before.  He prefers peritoneal dialysis. ? ?08/25/21: Patient returns after diagnostic laparoscopy.  At operation, I encountered hepatomegaly and bilious appearing ascites.  I abandoned this approach given the risk for continuous ascites leak.  Patient returns today to discuss options for hemodialysis.  I think he will do well with home hemodialysis.  He is right-handed.  He has never had hemodialysis access surgery before.  We reviewed the risk benefits and alternatives.  I explained to him that all dialysis access will fail given time, and maintenance is required. ? ?Past Medical History:  ?Diagnosis Date  ? Acute cerebral infarction [facility])   ? Acute CHF (congestive heart failure) ([place]) 05/22/2018  ? Acute encephalopathy 06/21/2020  ? Acute renal failure (ARF) ([place]) 06/18/2020  ? Chronic systolic congestive heart failure ([place])   ? Diabetes mellitus type I ([place])    ? Diabetes mellitus without complication ([place])   ? Dysphagia, post-stroke   ? ESRD on dialysis [facility])   ? Essential hypertension   ? Iron deficiency anemia 04/16/2018  ? ? ?Past Surgical History:  ?Procedure Laterality Date  ? CAPD INSERTION N/A 07/16/2021  ? Procedure: Diagnostic Laparoscopy;  Surgeon: Cherre Robins, MD;  Location: [facility];  Service: Vascular;  Laterality: N/A;  ? EYE SURGERY    ? RIGHT/LEFT HEART CATH AND CORONARY ANGIOGRAPHY N/A 05/25/2018  ? Procedure: RIGHT/LEFT HEART CATH AND CORONARY ANGIOGRAPHY;  Surgeon: Corey Skains, MD;  Location: [facility];  Service: Cardiovascular;  Laterality: N/A;  ? TONSILLECTOMY    ? ? ?Family History  ?Problem Relation Age of Onset  ? Hypertension Mother   ? ? ?Social History  ? ?Socioeconomic History  ? Marital status: Married  ?  Spouse name: [name]  ? Number of children: 2  ? Years of education: Not on file  ? Highest education level: Not on file  ?Occupational History  ? Not on file  ?Tobacco Use  ? Smoking status: Never  ? Smokeless tobacco: Never  ?Vaping Use  ? Vaping Use: Never used  ?Substance and Sexual Activity  ? Alcohol use: Not Currently  ? Drug use: Not Currently  ? Sexual activity: Not Currently  ?Other Topics Concern  ? 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 ?Social Determinants of Health  ? ?Financial Resource Strain: Not on file  ?Food Insecurity: Not on file  ?Transportation Needs: Not on file  ?Physical Activity: Not on file  ?Stress:  Not on file  ?Social Connections: Not on file  ?Intimate Partner Violence: Not on file  ? ? ?No Known Allergies ? ?Current Outpatient Medications  ?Medication Sig Dispense Refill  ? aspirin EC 81 MG tablet Take 81 mg by mouth in the morning. Swallow whole.    ? calcium acetate (PHOSLO) 667 MG tablet Take 1,334 mg by mouth with breakfast, with lunch, and with evening meal.    ? carvedilol (COREG) 25 MG tablet Take 1 tablet (25 mg total) by mouth 2 (two) times daily with a meal.  60 tablet 0  ? Cholecalciferol (VITAMIN D3) 50 MCG (2000 UT) TABS Take 4,000 Units by mouth in the morning.    ? ferrous sulfate 325 (65 FE) MG tablet Take 325 mg by mouth in the morning and at bedtime.    ? furosemide (LASIX) 80 MG tablet Take 80 mg by mouth daily as needed (fluid retention.).    ? hydrALAZINE (APRESOLINE) 50 MG tablet Take 1 tablet (50 mg total) by mouth in the morning and at bedtime. 180 tablet 1  ? insulin aspart (NOVOLOG) 100 UNIT/ML FlexPen Sliding scale ?CBG 70 - 120: 0 units  ?CBG 121 - 150: 1 unit  ?CBG 151 - 200: 2 units  ?CBG 201 - 250: 3 units  ?CBG 251 - 300: 5 units  ?CBG 301 - 350: 7 units  ?CBG 351 - 400 9 units 15 mL 0  ? Insulin Pen Needle 31G X 5 MM MISC 1 Device by Does not apply route 3 (three) times daily as needed (for insulin pen). 100 each 0  ? ?No current facility-administered medications for this visit.  ? ? ?PHYSICAL EXAM ?Vitals:  ? 08/25/21 1540  ?BP: (!) 147/95  ?Pulse: 87  ?Resp: 20  ?Temp: 99.2 ?F (37.3 ?C)  ?SpO2: 99%  ?Weight: 197 lb (89.4 kg)  ?Height: 6' (1.829 m)  ? ? ? ?Constitutional: Well-appearing.  No distress. ?Neurologic: Cranial nerves intact.  No focal findings. ?Psychiatric:  Mood and affect symmetric and appropriate. ?Eyes:  No icterus. No conjunctival pallor. ?Ears, nose, throat:  mucous membranes moist. Midline trachea.  ?Cardiac: Regular rate and rhythm. 2+ left radial pulse. Cephalic vein visible at the wrist.  ?Respiratory:  unlabored. ?Abdominal:  soft, non-tender, non-distended.  ?Extremity: no edema. no cyanosis. no pallor.  ?Skin: no gangrene. no ulceration.  ?Lymphatic: no Stemmer's sign. no palpable lymphadenopathy. ? ?PERTINENT LABORATORY AND RADIOLOGIC DATA ? ?Most recent CBC ? ?  Latest Ref Rng & Units 07/16/2021  ?  8:55 AM 06/27/2020  ?  5:00 AM 06/26/2020  ?  3:35 AM  ?CBC  ?WBC 4.0 - 10.5 K/uL  10.3   10.9    ?Hemoglobin 13.0 - 17.0 g/dL 10.9   8.5   9.2    ?Hematocrit 39.0 - 52.0 % 32.0   24.5   27.0    ?Platelets 150 - 400 K/uL  290    286    ?  ? ?Most recent CMP ? ?  Latest Ref Rng & Units 07/16/2021  ? 12:56 PM 07/16/2021  ?  8:55 AM 07/18/2020  ?  3:19 PM  ?CMP  ?Glucose 70 - 99 mg/dL  137   114    ?BUN 6 - 20 mg/dL  55   49    ?Creatinine 0.61 - 1.24 mg/dL  6.30   5.72    ?Sodium 135 - 145 mmol/L  138   142    ?Potassium 3.5 - 5.1 mmol/L  4.8  3.9    ?Chloride 98 - 111 mmol/L  107   109    ?CO2 20 - 29 mmol/L   16    ?Calcium 8.7 - 10.2 mg/dL   8.5    ?Total Protein 6.5 - 8.1 g/dL 6.4      ?Total Bilirubin 0.3 - 1.2 mg/dL 1.2      ?Alkaline Phos 38 - 126 U/L 67      ?AST 15 - 41 U/L 17      ?ALT 0 - 44 U/L 17      ? ? ?Renal function ?CrCl cannot be calculated (Patient's most recent lab result is older than the maximum 21 days allowed.). ? ?Hgb A1c MFr Bld (%)  ?Date Value  ?06/19/2020 7.4 (H)  ? ? ?LDL Cholesterol  ?Date Value Ref Range Status  ?06/22/2020 44 0 - 99 mg/dL Final  ?  Comment:  ?         ?Total Cholesterol/HDL:CHD Risk ?Coronary Heart Disease Risk Table ?                    Men   Women ? 1/2 Average Risk   3.4   3.3 ? Average Risk       5.0   4.4 ? 2 X Average Risk   9.6   7.1 ? 3 X Average Risk  23.4   11.0 ?       ?Use the calculated Patient Ratio ?above and the CHD Risk Table ?to determine the patient's CHD Risk. ?       ?ATP III CLASSIFICATION (LDL): ? <100     mg/dL   Optimal ? 100-129  mg/dL   Near or Above ?                   Optimal ? 130-159  mg/dL   Borderline ? 160-189  mg/dL   High ? >190     mg/dL   Very High ?Performed at [facility], [place] [address], [place], [place] [address] ?  ?  ? ? ?Yevonne Aline. Stanford Breed, MD ?[facility] [place] ?Office Phone Number: [phone] ?08/25/2021 5:00 PM ? ?Total time spent on preparing this encounter including chart review, data review, collecting history, examining the patient, coordinating care for this new patient, 45 minutes. ? ?Portions of this report may have been transcribed using voice recognition software.  Every effort has been made to ensure  accuracy; however, inadvertent computerized transcription errors may still be present. ? ? ? ?

## 2021-08-25 ENCOUNTER — Other Ambulatory Visit: Payer: Self-pay

## 2021-08-25 ENCOUNTER — Encounter: Payer: Self-pay | Admitting: Vascular Surgery

## 2021-08-25 ENCOUNTER — Ambulatory Visit (INDEPENDENT_AMBULATORY_CARE_PROVIDER_SITE_OTHER): Payer: BC Managed Care – PPO | Admitting: Vascular Surgery

## 2021-08-25 ENCOUNTER — Ambulatory Visit (INDEPENDENT_AMBULATORY_CARE_PROVIDER_SITE_OTHER)
Admission: RE | Admit: 2021-08-25 | Discharge: 2021-08-25 | Disposition: A | Discharge status: Home / Self Care | Payer: BC Managed Care – PPO | Source: Ambulatory Visit | Attending: Vascular Surgery | Admitting: Vascular Surgery

## 2021-08-25 ENCOUNTER — Ambulatory Visit (HOSPITAL_COMMUNITY)
Admission: RE | Admit: 2021-08-25 | Discharge: 2021-08-25 | Disposition: A | Discharge status: Home / Self Care | Payer: BC Managed Care – PPO | Source: Ambulatory Visit | Attending: Vascular Surgery | Admitting: Vascular Surgery

## 2021-08-25 VITALS — BP 147/95 | HR 87 | Temp 99.2°F | Resp 20 | Ht 72.0 in | Wt 197.0 lb

## 2021-08-25 DIAGNOSIS — N185 Chronic kidney disease, stage 5: Secondary | ICD-10-CM

## 2021-08-25 DIAGNOSIS — N186 End stage renal disease: Secondary | ICD-10-CM

## 2021-08-25 DIAGNOSIS — Z992 Dependence on renal dialysis: Secondary | ICD-10-CM

## 2021-08-28 ENCOUNTER — Encounter (HOSPITAL_COMMUNITY): Payer: Self-pay | Admitting: Vascular Surgery

## 2021-09-01 NOTE — Anesthesia Preprocedure Evaluation (Addendum)
Anesthesia Evaluation  ?Patient identified by MRN, date of birth, ID band ?Patient awake ? ? ? ?Reviewed: ?Allergy & Precautions, NPO status , Patient's Chart, lab work & pertinent test results ? ?Airway ?Mallampati: II ? ?TM Distance: >3 FB ?Neck ROM: Full ? ? ? Dental ? ?(+) Teeth Intact, Dental Advisory Given ?  ?Pulmonary ?neg pulmonary ROS,  ?  ?Pulmonary exam normal ?breath sounds clear to auscultation ? ? ? ? ? ? Cardiovascular ?hypertension, Pt. on medications and Pt. on home beta blockers ?+CHF  ?Normal cardiovascular exam ?Rhythm:Regular Rate:Normal ? ?Echo 04/2021 ??1. Left ventricular ejection fraction, by estimation, is 30 to 35%. Left ventricular ejection fraction by 3D volume is 31%. The left ventricle has moderately decreased function. The left ventricle demonstrates global hypokinesis. The left ventricular internal cavity size was severely dilated. Left ventricular diastolic parameters are consistent with Grade III diastolic dysfunction (restrictive). The average left ventricular global longitudinal strain is -5.9 %. The global longitudinal strain is abnormal.  ??2. Right ventricular systolic function is mildly reduced. The right ventricular size is moderately enlarged.  ??3. Left atrial size was mildly dilated.  ??4. Right atrial size was mildly dilated.  ??5. The mitral valve is normal in structure. Mild mitral valve  ?regurgitation.  ??6. Tricuspid valve regurgitation is moderate.  ??7. The aortic valve is tricuspid. Aortic valve regurgitation is mild. No aortic stenosis is present.  ? ? ?Stress ??  Findings are consistent with prior myocardial infarction and no prior ischemia. The study is high risk. ??  No ST deviation was noted. ??  Left ventricular function is abnormal. Nuclear stress EF: 19 %. The left ventricular ejection fraction is severely decreased (<30%). End diastolic cavity size is severely enlarged. ??  Prior study not available for  comparison. ?? ?There is a fixed defect in the inferior wall.   He had a cardiac cath in 2020 that showed no obstructive CAD.   ?The study is a high risk study based on his severely depressed LV function  ? ?  ?Neuro/Psych ?negative neurological ROS ?   ? GI/Hepatic ?negative GI ROS, Neg liver ROS,   ?Endo/Other  ?diabetes ? Renal/GU ?ESRFRenal disease  ? ?  ?Musculoskeletal ?negative musculoskeletal ROS ?(+)  ? Abdominal ?  ?Peds ? Hematology ? ?(+) Blood dyscrasia, anemia ,   ?Anesthesia Other Findings ? ? Reproductive/Obstetrics ? ?  ? ? ? ? ? ? ? ? ? ? ? ? ? ?  ?  ? ? ? ? ? ? ?Anesthesia Physical ?Anesthesia Plan ? ?ASA: 4 ? ?Anesthesia Plan: Regional  ? ?Post-op Pain Management: Regional block* and Tylenol PO (pre-op)*  ? ?Induction: Intravenous ? ?PONV Risk Score and Plan: 1 and Propofol infusion, Treatment may vary due to age or medical condition and Midazolam ? ?Airway Management Planned: Natural Airway ? ?Additional Equipment:  ? ?Intra-op Plan:  ? ?Post-operative Plan:  ? ?Informed Consent: I have reviewed the patients History and Physical, chart, labs and discussed the procedure including the risks, benefits and alternatives for the proposed anesthesia with the patient or authorized representative who has indicated his/her understanding and acceptance.  ? ? ? ?Dental advisory given ? ?Plan Discussed with: CRNA ? ?Anesthesia Plan Comments:   ? ? ? ? ? ?Anesthesia Quick Evaluation ? ?

## 2021-09-02 ENCOUNTER — Encounter (HOSPITAL_COMMUNITY)
Admission: RE | Disposition: A | Discharge status: Home / Self Care | Payer: Self-pay | Source: Home / Self Care | Attending: Vascular Surgery

## 2021-09-02 ENCOUNTER — Ambulatory Visit (HOSPITAL_COMMUNITY)
Admission: RE | Admit: 2021-09-02 | Discharge: 2021-09-02 | Disposition: A | Discharge status: Home / Self Care | Payer: BC Managed Care – PPO | Attending: Vascular Surgery | Admitting: Vascular Surgery

## 2021-09-02 ENCOUNTER — Encounter (HOSPITAL_COMMUNITY): Payer: Self-pay | Admitting: Vascular Surgery

## 2021-09-02 ENCOUNTER — Other Ambulatory Visit: Payer: Self-pay

## 2021-09-02 ENCOUNTER — Ambulatory Visit (HOSPITAL_COMMUNITY): Payer: BC Managed Care – PPO | Admitting: Anesthesiology

## 2021-09-02 DIAGNOSIS — D631 Anemia in chronic kidney disease: Secondary | ICD-10-CM | POA: Insufficient documentation

## 2021-09-02 DIAGNOSIS — N185 Chronic kidney disease, stage 5: Secondary | ICD-10-CM | POA: Diagnosis not present

## 2021-09-02 DIAGNOSIS — I5022 Chronic systolic (congestive) heart failure: Secondary | ICD-10-CM | POA: Diagnosis not present

## 2021-09-02 DIAGNOSIS — Z794 Long term (current) use of insulin: Secondary | ICD-10-CM | POA: Diagnosis not present

## 2021-09-02 DIAGNOSIS — E1022 Type 1 diabetes mellitus with diabetic chronic kidney disease: Secondary | ICD-10-CM | POA: Insufficient documentation

## 2021-09-02 DIAGNOSIS — I132 Hypertensive heart and chronic kidney disease with heart failure and with stage 5 chronic kidney disease, or end stage renal disease: Secondary | ICD-10-CM | POA: Diagnosis not present

## 2021-09-02 DIAGNOSIS — N186 End stage renal disease: Secondary | ICD-10-CM | POA: Diagnosis not present

## 2021-09-02 DIAGNOSIS — Z79899 Other long term (current) drug therapy: Secondary | ICD-10-CM | POA: Insufficient documentation

## 2021-09-02 DIAGNOSIS — Z8249 Family history of ischemic heart disease and other diseases of the circulatory system: Secondary | ICD-10-CM | POA: Diagnosis not present

## 2021-09-02 HISTORY — PX: AV FISTULA PLACEMENT: SHX1204

## 2021-09-02 LAB — POCT I-STAT, CHEM 8
BUN: 43 mg/dL — ABNORMAL HIGH (ref 6–20)
Calcium, Ion: 1.06 mmol/L — ABNORMAL LOW (ref 1.15–1.40)
Chloride: 101 mmol/L (ref 98–111)
Creatinine, Ser: 5.7 mg/dL — ABNORMAL HIGH (ref 0.61–1.24)
Glucose, Bld: 180 mg/dL — ABNORMAL HIGH (ref 70–99)
HCT: 36 % — ABNORMAL LOW (ref 39.0–52.0)
Hemoglobin: 12.2 g/dL — ABNORMAL LOW (ref 13.0–17.0)
Potassium: 4.4 mmol/L (ref 3.5–5.1)
Sodium: 139 mmol/L (ref 135–145)
TCO2: 29 mmol/L (ref 22–32)

## 2021-09-02 SURGERY — ARTERIOVENOUS (AV) FISTULA CREATION
Anesthesia: Regional | Site: Arm Lower | Laterality: Left

## 2021-09-02 MED ORDER — FENTANYL CITRATE (PF) 250 MCG/5ML IJ SOLN
INTRAMUSCULAR | Status: AC
  Filled 2021-09-02: qty 5

## 2021-09-02 MED ORDER — ONDANSETRON HCL 4 MG/2ML IJ SOLN
INTRAMUSCULAR | Status: DC | PRN
  Administered 2021-09-02: 4 mg via INTRAVENOUS

## 2021-09-02 MED ORDER — CHLORHEXIDINE GLUCONATE 4 % EX LIQD: 60.0000 mL | Freq: Once | CUTANEOUS | Status: DC

## 2021-09-02 MED ORDER — ONDANSETRON HCL 4 MG/2ML IJ SOLN
INTRAMUSCULAR | Status: AC
  Filled 2021-09-02: qty 2

## 2021-09-02 MED ORDER — LIDOCAINE HCL (CARDIAC) PF 100 MG/5ML IV SOSY
PREFILLED_SYRINGE | INTRAVENOUS | Status: DC | PRN
  Administered 2021-09-02: 100 mg via INTRATRACHEAL

## 2021-09-02 MED ORDER — ACETAMINOPHEN 500 MG PO TABS
1000.0000 mg | ORAL_TABLET | Freq: Once | ORAL | Status: AC
  Administered 2021-09-02: 1000 mg via ORAL
  Filled 2021-09-02: qty 2

## 2021-09-02 MED ORDER — CHLORHEXIDINE GLUCONATE 0.12 % MT SOLN
OROMUCOSAL | Status: AC
  Administered 2021-09-02: 15 mL
  Filled 2021-09-02: qty 15

## 2021-09-02 MED ORDER — OXYCODONE-ACETAMINOPHEN 5-325 MG PO TABS: 1.0000 | ORAL_TABLET | Freq: Four times a day (QID) | ORAL | 0 refills | Status: DC | PRN

## 2021-09-02 MED ORDER — GLYCOPYRROLATE PF 0.2 MG/ML IJ SOSY
PREFILLED_SYRINGE | INTRAMUSCULAR | Status: AC
  Filled 2021-09-02: qty 1

## 2021-09-02 MED ORDER — MEPIVACAINE HCL (PF) 1.5 % IJ SOLN: INTRAMUSCULAR | Status: DC | PRN

## 2021-09-02 MED ORDER — GLYCOPYRROLATE 0.2 MG/ML IJ SOLN
INTRAMUSCULAR | Status: DC | PRN
  Administered 2021-09-02: .2 mg via INTRAVENOUS

## 2021-09-02 MED ORDER — SODIUM CHLORIDE 0.9 % IV SOLN: INTRAVENOUS | Status: DC

## 2021-09-02 MED ORDER — LIDOCAINE-EPINEPHRINE 2 %-1:100000 IJ SOLN
INTRAMUSCULAR | Status: DC | PRN
  Administered 2021-09-02: 5 mL via PERINEURAL

## 2021-09-02 MED ORDER — MIDAZOLAM HCL 2 MG/2ML IJ SOLN
INTRAMUSCULAR | Status: AC
  Filled 2021-09-02: qty 2

## 2021-09-02 MED ORDER — CEFAZOLIN SODIUM-DEXTROSE 2-4 GM/100ML-% IV SOLN
2.0000 g | INTRAVENOUS | Status: AC
  Administered 2021-09-02: 2 g via INTRAVENOUS
  Filled 2021-09-02: qty 100

## 2021-09-02 MED ORDER — HEPARIN 6000 UNIT IRRIGATION SOLUTION
Status: DC | PRN
  Administered 2021-09-02: 1

## 2021-09-02 MED ORDER — PROPOFOL 500 MG/50ML IV EMUL
INTRAVENOUS | Status: DC | PRN
  Administered 2021-09-02: 100 ug/kg/min via INTRAVENOUS

## 2021-09-02 MED ORDER — HEPARIN SODIUM (PORCINE) 1000 UNIT/ML IJ SOLN
INTRAMUSCULAR | Status: AC
  Filled 2021-09-02: qty 10

## 2021-09-02 MED ORDER — PROPOFOL 10 MG/ML IV BOLUS
INTRAVENOUS | Status: AC
  Filled 2021-09-02: qty 20

## 2021-09-02 MED ORDER — 0.9 % SODIUM CHLORIDE (POUR BTL) OPTIME
TOPICAL | Status: DC | PRN
  Administered 2021-09-02: 1000 mL

## 2021-09-02 MED ORDER — MEPIVACAINE HCL (PF) 1.5 % IJ SOLN
INTRAMUSCULAR | Status: DC | PRN
  Administered 2021-09-02: 20 mL via PERINEURAL

## 2021-09-02 MED ORDER — FENTANYL CITRATE (PF) 250 MCG/5ML IJ SOLN
INTRAMUSCULAR | Status: DC | PRN
  Administered 2021-09-02: 100 ug via INTRAVENOUS

## 2021-09-02 MED ORDER — SODIUM CHLORIDE 0.9 % IV SOLN: INTRAVENOUS | Status: DC | PRN

## 2021-09-02 MED ORDER — MIDAZOLAM HCL 2 MG/2ML IJ SOLN
INTRAMUSCULAR | Status: DC | PRN
  Administered 2021-09-02: 2 mg via INTRAVENOUS

## 2021-09-02 MED ORDER — HEPARIN SODIUM (PORCINE) 1000 UNIT/ML IJ SOLN
INTRAMUSCULAR | Status: DC | PRN
Start: 2021-09-02 — End: 2021-09-02
  Administered 2021-09-02: 3000 [IU] via INTRAVENOUS

## 2021-09-02 SURGICAL SUPPLY — 37 items
ARMBAND PINK RESTRICT EXTREMIT (MISCELLANEOUS) ×4 IMPLANT
BAG COUNTER SPONGE SURGICOUNT (BAG) ×2 IMPLANT
BLADE CLIPPER SURG (BLADE) ×2 IMPLANT
CANISTER SUCT 3000ML PPV (MISCELLANEOUS) ×2 IMPLANT
CLIP TI WIDE RED SMALL 6 (CLIP) ×1 IMPLANT
CLIP VESOCCLUDE MED 6/CT (CLIP) ×2 IMPLANT
CLIP VESOCCLUDE SM WIDE 6/CT (CLIP) ×2 IMPLANT
COVER PROBE W GEL 5X96 (DRAPES) ×3 IMPLANT
DECANTER SPIKE VIAL GLASS SM (MISCELLANEOUS) ×2 IMPLANT
DERMABOND ADVANCED (GAUZE/BANDAGES/DRESSINGS) ×1
DERMABOND ADVANCED .7 DNX12 (GAUZE/BANDAGES/DRESSINGS) ×1 IMPLANT
ELECT REM PT RETURN 9FT ADLT (ELECTROSURGICAL) ×2
ELECTRODE REM PT RTRN 9FT ADLT (ELECTROSURGICAL) ×1 IMPLANT
GLOVE BIO SURGEON STRL SZ7.5 (GLOVE) ×2 IMPLANT
GLOVE BIOGEL PI IND STRL 8 (GLOVE) ×1 IMPLANT
GLOVE BIOGEL PI INDICATOR 8 (GLOVE) ×1
GOWN STRL REUS W/ TWL LRG LVL3 (GOWN DISPOSABLE) ×2 IMPLANT
GOWN STRL REUS W/ TWL XL LVL3 (GOWN DISPOSABLE) ×2 IMPLANT
GOWN STRL REUS W/TWL LRG LVL3 (GOWN DISPOSABLE) ×2
GOWN STRL REUS W/TWL XL LVL3 (GOWN DISPOSABLE) ×2
HEMOSTAT SPONGE AVITENE ULTRA (HEMOSTASIS) IMPLANT
KIT BASIN OR (CUSTOM PROCEDURE TRAY) ×2 IMPLANT
KIT TURNOVER KIT B (KITS) ×2 IMPLANT
LOOP VESSEL MINI RED (MISCELLANEOUS) ×1 IMPLANT
NS IRRIG 1000ML POUR BTL (IV SOLUTION) ×2 IMPLANT
PACK CV ACCESS (CUSTOM PROCEDURE TRAY) ×2 IMPLANT
PAD ARMBOARD 7.5X6 YLW CONV (MISCELLANEOUS) ×4 IMPLANT
SLING ARM FOAM STRAP LRG (SOFTGOODS) ×1 IMPLANT
SLING ARM FOAM STRAP MED (SOFTGOODS) IMPLANT
SUT MNCRL AB 4-0 PS2 18 (SUTURE) ×2 IMPLANT
SUT PROLENE 6 0 BV (SUTURE) ×3 IMPLANT
SUT PROLENE 7 0 BV 1 (SUTURE) IMPLANT
SUT VIC AB 3-0 SH 27 (SUTURE) ×1
SUT VIC AB 3-0 SH 27X BRD (SUTURE) ×1 IMPLANT
TOWEL GREEN STERILE (TOWEL DISPOSABLE) ×2 IMPLANT
UNDERPAD 30X36 HEAVY ABSORB (UNDERPADS AND DIAPERS) ×2 IMPLANT
WATER STERILE IRR 1000ML POUR (IV SOLUTION) ×2 IMPLANT

## 2021-09-02 NOTE — Transfer of Care (Signed)
Immediate Anesthesia Transfer of Care Note ? ?Patient: Adrian Evans ? ?Procedure(s) Performed: LEFT ARM RADIOCEPHALIC ARTERIOVENOUS (AV) FISTULA CREATION (Left: Arm Lower) ? ?Patient Location: PACU ? ?Anesthesia Type:MAC combined with regional for post-op pain ? ?Level of Consciousness: awake, oriented, drowsy, patient cooperative and responds to stimulation ? ?Airway & Oxygen Therapy: Patient Spontanous Breathing ? ?Post-op Assessment: Report given to RN, Post -op Vital signs reviewed and stable and Patient moving all extremities ? ?Post vital signs: Reviewed and stable ? ?Last Vitals:  ?Vitals Value Taken Time  ?BP 123/79 09/02/21 1145  ?Temp    ?Pulse 85 09/02/21 1147  ?Resp 15 09/02/21 1147  ?SpO2 92 % 09/02/21 1147  ?Vitals shown include unvalidated device data. ? ?Last Pain:  ?Vitals:  ? 09/02/21 0914  ?TempSrc:   ?PainSc: 0-No pain  ?   ? ?  ? ?Complications: No notable events documented. ?

## 2021-09-02 NOTE — H&P (Signed)
History and Physical Interval Note: ? ?09/02/2021 ?9:19 AM ? ?Adrian Evans  has presented today for surgery, with the diagnosis of END STAGE RENAL DISEASE.  The various methods of treatment have been discussed with the patient and family. After consideration of risks, benefits and other options for treatment, the patient has consented to  Procedure(s): ?LEFT ARM RADIOCEPHALIC ARTERIOVENOUS (AV) FISTULA CREATION (Left) as a surgical intervention.  The patient's history has been reviewed, patient examined, no change in status, stable for surgery.  I have reviewed the patient's chart and labs.  Questions were answered to the patient's satisfaction.   ? ?Discussed plan for left arm AVF.  Has nice vein at wrist so radiocephalic vs brachiocephalic. ? ?[name] ? ? ?[facility] [place] ?  ?ASSESSMENT / PLAN: ?Adrian Evans is a 36 y.o. right handed male in need of permanent dialysis access. I reviewed options for dialysis in detail with the patient, including hemodialysis and peritoneal dialysis. I counseled the patient to ask their nephrologist about their candidacy for renal transplant. I counseled the patient that dialysis access requires surveillance and periodic maintenance. The patient wants to proceed as quickly as possible. My next available appointment is in 2 weeks. I offered the patient a left radiocephalic arteriovenous fistula with Dr. Carlis Abbott 4/12.  ?  ?CHIEF COMPLAINT: in need of dialysis access ?  ?HISTORY OF PRESENT ILLNESS: ?Adrian Evans is a 36 y.o. male referred to clinic for evaluation of dialysis access.  The patient is a well-informed, highly functional young man who has studied the options available to him.  He is working on getting a transplant through a living related donor, and seems to be making good progress.  He is right-handed.  He has never had abdominal surgery before.  He prefers peritoneal dialysis. ?  ?08/25/21: Patient returns after diagnostic  laparoscopy.  At operation, I encountered hepatomegaly and bilious appearing ascites.  I abandoned this approach given the risk for continuous ascites leak.  Patient returns today to discuss options for hemodialysis.  I think he will do well with home hemodialysis.  He is right-handed.  He has never had hemodialysis access surgery before.  We reviewed the risk benefits and alternatives.  I explained to him that all dialysis access will fail given time, and maintenance is required. ?  ?    ?Past Medical History:  ?Diagnosis Date  ? Acute cerebral infarction [facility])    ? Acute CHF (congestive heart failure) ([place]) 05/22/2018  ? Acute encephalopathy 06/21/2020  ? Acute renal failure (ARF) ([place]) 06/18/2020  ? Chronic systolic congestive heart failure ([place])    ? Diabetes mellitus type I ([place])    ? Diabetes mellitus without complication ([place])    ? Dysphagia, post-stroke    ? ESRD on dialysis [facility])    ? Essential hypertension    ? Iron deficiency anemia 04/16/2018  ?  ?  ?     ?Past Surgical History:  ?Procedure Laterality Date  ? CAPD INSERTION N/A 07/16/2021  ?  Procedure: Diagnostic Laparoscopy;  Surgeon: Cherre Robins, MD;  Location: [facility];  Service: Vascular;  Laterality: N/A;  ? EYE SURGERY      ? RIGHT/LEFT HEART CATH AND CORONARY ANGIOGRAPHY N/A 05/25/2018  ?  Procedure: RIGHT/LEFT HEART CATH AND CORONARY ANGIOGRAPHY;  Surgeon: Corey Skains, MD;  Location: [place] [facility] CV LAB;  Service: Cardiovascular;  Laterality: N/A;  ? TONSILLECTOMY      ?  ?  ?     ?  Family History  ?Problem Relation Age of Onset  ? Hypertension Mother    ?  ?  ?Social History  ?  ?     ?Socioeconomic History  ? Marital status: Married  ?    Spouse name: [name]  ? Number of children: 2  ? Years of education: Not on file  ? Highest education level: Not on file  ?Occupational History  ? Not on file  ?Tobacco Use  ? Smoking status: Never  ? Smokeless tobacco: Never  ?Vaping Use  ? Vaping Use: Never used  ?Substance and Sexual Activity  ?  Alcohol use: Not Currently  ? Drug use: Not Currently  ? Sexual activity: Not Currently  ?Other Topics Concern  ? 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  ?Social Determinants of Health  ?  ?Financial Resource Strain: Not on file  ?Food Insecurity: Not on file  ?Transportation Needs: Not on file  ?Physical Activity: Not on file  ?Stress: Not on file  ?Social Connections: Not on file  ?Intimate Partner Violence: Not on file  ?  ?  ?No Known Allergies ?  ?      ?Current Outpatient Medications  ?Medication Sig Dispense Refill  ? aspirin EC 81 MG tablet Take 81 mg by mouth in the morning. Swallow whole.      ? calcium acetate (PHOSLO) 667 MG tablet Take 1,334 mg by mouth with breakfast, with lunch, and with evening meal.      ? carvedilol (COREG) 25 MG tablet Take 1 tablet (25 mg total) by mouth 2 (two) times daily with a meal. 60 tablet 0  ? Cholecalciferol (VITAMIN D3) 50 MCG (2000 UT) TABS Take 4,000 Units by mouth in the morning.      ? ferrous sulfate 325 (65 FE) MG tablet Take 325 mg by mouth in the morning and at bedtime.      ? furosemide (LASIX) 80 MG tablet Take 80 mg by mouth daily as needed (fluid retention.).      ? hydrALAZINE (APRESOLINE) 50 MG tablet Take 1 tablet (50 mg total) by mouth in the morning and at bedtime. 180 tablet 1  ? insulin aspart (NOVOLOG) 100 UNIT/ML FlexPen Sliding scale ?CBG 70 - 120:         0 units     ?CBG 121 - 150:       1 unit      ?CBG 151 - 200:       2 units     ?CBG 201 - 250:       3 units     ?CBG 251 - 300:       5 units     ?CBG 301 - 350:       7 units     ?CBG 351 - 400        9 units 15 mL 0  ? Insulin Pen Needle 31G X 5 MM MISC 1 Device by Does not apply route 3 (three) times daily as needed (for insulin pen). 100 each 0  ?  ?No current facility-administered medications for this visit.  ?  ?  ?PHYSICAL EXAM ?   ?Vitals:  ?  08/25/21 1540  ?BP: (!) 147/95  ?Pulse: 87  ?Resp: 20  ?Temp: 99.2 ?F (37.3 ?C)  ?SpO2: 99%  ?Weight: 197 lb (89.4 kg)   ?Height: 6' (1.829 m)  ?  ?  ?  ?Constitutional: Well-appearing.  No distress. ?Neurologic:  Cranial nerves intact.  No focal findings. ?Psychiatric:  Mood and affect symmetric and appropriate. ?Eyes:  No icterus. No conjunctival pallor. ?Ears, nose, throat:  mucous membranes moist. Midline trachea.  ?Cardiac: Regular rate and rhythm. 2+ left radial pulse. Cephalic vein visible at the wrist.  ?Respiratory:  unlabored. ?Abdominal:  soft, non-tender, non-distended.  ?Extremity: no edema. no cyanosis. no pallor.  ?Skin: no gangrene. no ulceration.  ?Lymphatic: no Stemmer's sign. no palpable lymphadenopathy. ?  ?PERTINENT LABORATORY AND RADIOLOGIC DATA ?  ?Most recent CBC ?  ?  Latest Ref Rng & Units 07/16/2021  ?  8:55 AM 06/27/2020  ?  5:00 AM 06/26/2020  ?  3:35 AM  ?CBC  ?WBC 4.0 - 10.5 K/uL   10.3   10.9    ?Hemoglobin 13.0 - 17.0 g/dL 10.9   8.5   9.2    ?Hematocrit 39.0 - 52.0 % 32.0   24.5   27.0    ?Platelets 150 - 400 K/uL   290   286    ?  ?  ?Most recent CMP ?  ?  Latest Ref Rng & Units 07/16/2021  ? 12:56 PM 07/16/2021  ?  8:55 AM 07/18/2020  ?  3:19 PM  ?CMP  ?Glucose 70 - 99 mg/dL   137   114    ?BUN 6 - 20 mg/dL   55   49    ?Creatinine 0.61 - 1.24 mg/dL   6.30   5.72    ?Sodium 135 - 145 mmol/L   138   142    ?Potassium 3.5 - 5.1 mmol/L   4.8   3.9    ?Chloride 98 - 111 mmol/L   107   109    ?CO2 20 - 29 mmol/L     16    ?Calcium 8.7 - 10.2 mg/dL     8.5    ?Total Protein 6.5 - 8.1 g/dL 6.4        ?Total Bilirubin 0.3 - 1.2 mg/dL 1.2        ?Alkaline Phos 38 - 126 U/L 67        ?AST 15 - 41 U/L 17        ?ALT 0 - 44 U/L 17        ?  ?  ?Renal function ?CrCl cannot be calculated (Patient's most recent lab result is older than the maximum 21 days allowed.). ?  ?Last Labs  ?   ?Hgb A1c MFr Bld (%)  ?Date Value  ?06/19/2020 7.4 (H)  ?  ?  ?  ?Last Labs  ?       ?LDL Cholesterol  ?Date Value Ref Range Status  ?06/22/2020 44 0 - 99 mg/dL Final  ?    Comment:  ?           ?Total Cholesterol/HDL:CHD Risk ?Coronary  Heart Disease Risk Table ?                    Men   Women ? 1/2 Average Risk   3.4   3.3 ? Average Risk       5.0   4.4 ? 2 X Average Risk   9.6   7.1 ? 3 X Average Risk  23.4   11.0 ?       ?Use the calculated [facility]

## 2021-09-02 NOTE — Anesthesia Postprocedure Evaluation (Signed)
Anesthesia Post Note ? ?Patient: Adrian Evans ? ?Procedure(s) Performed: LEFT ARM RADIOCEPHALIC ARTERIOVENOUS (AV) FISTULA CREATION (Left: Arm Lower) ? ?  ? ?Patient location during evaluation: PACU ?Anesthesia Type: Regional ?Level of consciousness: awake and alert ?Pain management: pain level controlled ?Vital Signs Assessment: post-procedure vital signs reviewed and stable ?Respiratory status: spontaneous breathing ?Cardiovascular status: stable ?Anesthetic complications: no ? ? ?No notable events documented. ? ?Last Vitals:  ?Vitals:  ? 09/02/21 1200 09/02/21 1215  ?BP: 126/83 128/85  ?Pulse: 83 82  ?Resp: 14 14  ?Temp:  36.7 ?C  ?SpO2: 92% 93%  ?  ?Last Pain:  ?Vitals:  ? 09/02/21 1215  ?TempSrc:   ?PainSc: 0-No pain  ? ? ?  ?  ?  ?  ?  ?  ? ?Nolon Nations ? ? ? ? ?

## 2021-09-02 NOTE — Anesthesia Procedure Notes (Signed)
Anesthesia Regional Block: Supraclavicular block  ? ?Pre-Anesthetic Checklist: , timeout performed,  Correct Patient, Correct Site, Correct Laterality,  Correct Procedure, Correct Position, site marked,  Risks and benefits discussed,  Surgical consent,  Pre-op evaluation,  At surgeon's request and post-op pain management ? ?Laterality: Upper and Left ? ?Prep: chloraprep     ?  ?Needles:  ?Injection technique: Single-shot ? ?Needle Type: Stimiplex   ? ? ? ? ? ? ? ?Additional Needles: ? ? ?Procedures:,,,, ultrasound used (permanent image in chart),,    ?Narrative:  ?Start time: 09/02/2021 9:53 AM ?End time: 09/02/2021 10:13 AM ?Injection made incrementally with aspirations every 5 mL. ? ?Performed by: Personally  ?Anesthesiologist: Nolon Nations, MD ? ?Additional Notes: ?BP cuff, SpO2 and EKG monitors applied. Sedation begun. Nerve location verified with ultrasound. Anesthetic injected incrementally, slowly, and after neg aspirations under direct u/s guidance. Good perineural spread. Tolerated well. ? ? ? ? ?

## 2021-09-02 NOTE — Op Note (Signed)
? ? ?OPERATIVE NOTE ? ? ?PROCEDURE: left radiocephaic arteriovenous fistula placement ? ?PRE-OPERATIVE DIAGNOSIS: end stage renal disease ? ?POST-OPERATIVE DIAGNOSIS: same ? ?SURGEON: Monica Martinez, MD ? ?ASSISTANT(S): Paulo Fruit, PA ? ?ANESTHESIA: regional ? ?ESTIMATED BLOOD LOSS: Minimal cc ? ?FINDING(S): ?1.  Cephalic vein: 3.5 mm, acceptable ?2.  Radial artery: 2.5 mm, disease free ?3.  Venous outflow: palpable thrill  ?4.  Radial flow: palpable radial pulse ? ?SPECIMEN(S):  none ? ?INDICATIONS:   ?Adrian Evans is a 36 y.o. male who presents with end stage renal disease and need for permanent hemodialysis access.  The patient is scheduled for left arm arteriovenous fistula placement.  The patient is aware the risks include but are not limited to: bleeding, infection, steal syndrome, nerve damage, ischemic monomelic neuropathy, failure to mature, and need for additional procedures.  The patient is aware of the risks of the procedure and elects to proceed forward.  An assistant was needed for exposure and to expedite the case and to sew the anastomosis. ? ?DESCRIPTION: ?After full informed written consent was obtained from the patient, the patient was brought back to the operating room and placed supine upon the operating table.  Prior to induction, the patient received IV antibiotics.   After obtaining adequate anesthesia, the patient was then prepped and draped in the standard fashion for a left arm access procedure.  ? ?I turned my attention first to identifying the patient's distal cephalic vein and radial artery.  Using SonoSite guidance, the location of these vessels were marked out on the skin.   I made a longitudinal incision at the level of the wrist and dissected through the subcutaneous tissue and fascia to gain exposure of the radial artery.  This was noted to be 2.5 mm in diameter externally.  This was dissected out proximally and distally and controlled with vessel loops .  I then  dissected out the cephalic vein.  This was noted to be 3.5 mm in diameter externally.  The distal segment of the vein was ligated with a  2-0 silk, and the vein was transected.  The proximal segment was interrogated with serial dilators.  The vein accepted up to a 4 mm dilator without any difficulty.  I then instilled the heparinized saline into the vein and clamped it.  At this point, I reset my exposure of the radial artery.  The patient was given 3,000 units IV heparin.  I then placed the artery under tension proximally and distally.  I made an arteriotomy with a #11 blade, and then I extended the arteriotomy with a Potts scissor.  The vein was then sewn to the artery in an end-to-side configuration with a running stitch of 6-0 Prolene with the help of my assistant.  Prior to completing this anastomosis, I allowed the vein and artery to backbleed.  There was no evidence of clot from any vessels.  I completed the anastomosis in the usual fashion and then released all vessel loops and clamps.   ? ?There was a palpable thrill in the venous outflow, and there was a palpable radial pulse.  At this point, I irrigated out the surgical wound.  There was no further active bleeding.  The subcutaneous tissue was reapproximated with a running stitch of 3-0 Vicryl.  The skin was then reapproximated with a running subcuticular stitch of 4-0 Monocryl.  The skin was then cleaned, dried, and reinforced with Dermabond.  The patient tolerated this procedure well.  ? ?COMPLICATIONS: None ? ?CONDITION:  Stable ? ?Monica Martinez, MD ?[facility] ?Office: [phone] ? ?[name] ? ? ?09/02/2021, 11:39 AM ? ?

## 2021-09-02 NOTE — Discharge Instructions (Signed)
? ?[facility] [place] ? ?Discharge Instructions ? ?AV Fistula or Graft Surgery for Dialysis Access ? ?Please refer to the following instructions for your post-procedure care. Your surgeon or physician assistant will discuss any changes with you. ? ?Activity ? ?You may drive the day following your surgery, if you are comfortable and no longer taking prescription pain medication. Resume full activity as the soreness in your incision resolves. ? ?Bathing/Showering ? ?You may shower after you go home. Keep your incision dry for 48 hours. Do not soak in a bathtub, hot tub, or swim until the incision heals completely. You may not shower if you have a hemodialysis catheter. ? ?Incision Care ? ?Clean your incision with mild soap and water after 48 hours. Pat the area dry with a clean towel. You do not need a bandage unless otherwise instructed. Do not apply any ointments or creams to your incision. You may have skin glue on your incision. Do not peel it off. It will come off on its own in about one week. Your arm may swell a bit after surgery. To reduce swelling use pillows to elevate your arm so it is above your heart. Your doctor will tell you if you need to lightly wrap your arm with an ACE bandage. ? ?Diet ? ?Resume your normal diet. There are not special food restrictions following this procedure. In order to heal from your surgery, it is CRITICAL to get adequate nutrition. Your body requires vitamins, minerals, and protein. Vegetables are the best source of vitamins and minerals. Vegetables also provide the perfect balance of protein. Processed food has little nutritional value, so try to avoid this. ? ?Medications ? ?Resume taking all of your medications. If your incision is causing pain, you may take over-the counter pain relievers such as acetaminophen (Tylenol). If you were prescribed a stronger pain medication, please be aware these medications can cause nausea and constipation. Prevent  nausea by taking the medication with a snack or meal. Avoid constipation by drinking plenty of fluids and eating foods with high amount of fiber, such as fruits, vegetables, and grains.  ?Do not take Tylenol if you are taking prescription pain medications. ? ?Follow up ?Your surgeon may want to see you in the office following your access surgery. If so, this will be arranged at the time of your surgery. ? ?Please call us immediately for any of the following conditions: ? ?Increased pain, redness, drainage (pus) from your incision site ?Fever of 101 degrees or higher ?Severe or worsening pain at your incision site ?Hand pain or numbness. ? ?Reduce your risk of vascular disease: ? ?Stop smoking. If you would like help, call [facility] at 1-800-QUIT-NOW (463)644-9674) or [facility] at 917-616-7805 ? ?Manage your cholesterol ?Maintain a desired weight ?Control your diabetes ?Keep your blood pressure down ? ?Dialysis ? ?It will take several weeks to several months for your new dialysis access to be ready for use. Your surgeon will determine when it is okay to use it. Your nephrologist will continue to direct your dialysis. You can continue to use your Permcath until your new access is ready for use. ? ? ?09/02/2021 ?Adrian Evans ?[id number] ?1985/07/22 ? ?Surgeon(s): ?[name], MD ? ?Procedure(s): ?LEFT ARM RADIOCEPHALIC ARTERIOVENOUS (AV) FISTULA CREATION ? ? May stick graft immediately  ? May stick graft on designated area only:   ?X Do not stick left AV fistula for 12 weeks  ? ? ?If you have any questions, please call the  office at [phone]. ?

## 2021-09-02 NOTE — Progress Notes (Signed)
CBG 180 on iStat on DOS. ? ?Dr. Lissa Hoard notified.  Do not give insulin, per Dr. Lissa Hoard. ? ? ?

## 2021-09-02 NOTE — Anesthesia Procedure Notes (Signed)
Procedure Name: [place] ?Date/Time: 09/02/2021 10:29 AM ?Performed by: Claris Che, CRNA ?Pre-anesthesia Checklist: Patient identified, Emergency Drugs available, Suction available, Patient being monitored and Timeout performed ?Patient Re-evaluated:Patient Re-evaluated prior to induction ?Oxygen Delivery Method: Simple face mask ?Ventilation: Oral airway inserted - appropriate to patient size ? ? ? ? ?

## 2021-09-03 ENCOUNTER — Encounter (HOSPITAL_COMMUNITY): Payer: Self-pay | Admitting: Vascular Surgery

## 2021-09-07 ENCOUNTER — Other Ambulatory Visit (HOSPITAL_BASED_OUTPATIENT_CLINIC_OR_DEPARTMENT_OTHER): Payer: Self-pay | Admitting: *Deleted

## 2021-09-07 DIAGNOSIS — I10 Essential (primary) hypertension: Secondary | ICD-10-CM

## 2021-09-07 DIAGNOSIS — I5020 Unspecified systolic (congestive) heart failure: Secondary | ICD-10-CM

## 2021-09-07 MED ORDER — HYDRALAZINE HCL 50 MG PO TABS: 50.0000 mg | ORAL_TABLET | Freq: Two times a day (BID) | ORAL | 0 refills | Status: DC

## 2021-09-07 NOTE — Telephone Encounter (Signed)
Rx(s) sent to pharmacy electronically.  

## 2021-09-28 ENCOUNTER — Other Ambulatory Visit: Payer: Self-pay

## 2021-09-28 DIAGNOSIS — N186 End stage renal disease: Secondary | ICD-10-CM

## 2021-10-13 ENCOUNTER — Encounter: Payer: BC Managed Care – PPO | Admitting: Vascular Surgery

## 2021-10-13 ENCOUNTER — Encounter (HOSPITAL_COMMUNITY): Payer: BC Managed Care – PPO

## 2021-11-10 ENCOUNTER — Encounter: Payer: BC Managed Care – PPO | Admitting: Vascular Surgery

## 2021-11-10 ENCOUNTER — Encounter (HOSPITAL_COMMUNITY): Payer: BC Managed Care – PPO

## 2021-11-11 ENCOUNTER — Ambulatory Visit (HOSPITAL_COMMUNITY)
Admission: RE | Admit: 2021-11-11 | Discharge: 2021-11-11 | Disposition: A | Discharge status: Home / Self Care | Payer: BC Managed Care – PPO | Source: Ambulatory Visit | Attending: Surgery | Admitting: Surgery

## 2021-11-11 DIAGNOSIS — N186 End stage renal disease: Secondary | ICD-10-CM | POA: Diagnosis not present

## 2021-11-11 DIAGNOSIS — Z992 Dependence on renal dialysis: Secondary | ICD-10-CM | POA: Diagnosis not present

## 2021-11-13 ENCOUNTER — Other Ambulatory Visit (HOSPITAL_BASED_OUTPATIENT_CLINIC_OR_DEPARTMENT_OTHER): Payer: Self-pay | Admitting: Family

## 2021-11-13 DIAGNOSIS — I10 Essential (primary) hypertension: Secondary | ICD-10-CM

## 2021-11-13 DIAGNOSIS — I5020 Unspecified systolic (congestive) heart failure: Secondary | ICD-10-CM

## 2021-11-17 ENCOUNTER — Encounter: Payer: BC Managed Care – PPO | Admitting: Vascular Surgery

## 2021-11-17 NOTE — Progress Notes (Signed)
Postoperative Access Visit   History of Present Illness   JEREMIAH CURCI is a 36 y.o. year old male who presents for postoperative follow-up for:  left radiocephaic arteriovenous fistula placement 09/02/21 by Dr. Carlis Abbott. The patient's wounds are well healed.  The patient notes no steal symptoms.  The patient is  able to complete their activities of daily living.   He currently dialyzes via right IJ TDC  4x/ week at home. His wife assists him with home HD  His primary HD outpatient center is the [facility] location  Physical Examination   Vitals:   11/19/21 1417  BP: (!) 133/92  Pulse: 99  Resp: 14  Temp: 97.7 F (36.5 C)  TempSrc: Temporal  SpO2: 98%  Weight: 205 lb 6.4 oz (93.2 kg)  Height: 5\' 11"  (1.803 m)   Body mass index is 28.65 kg/m.  left arm Incision is well healed, 2+ radial pulse, hand grip is 5/5, sensation in digits is intact, palpable thrill, bruit can be auscultated    Non invasive study: 11/11/21    Findings:  +--------------------+----------+-----------------+--------+  AVF                 PSV (cm/s)Flow Vol (mL/min)Comments  +--------------------+----------+-----------------+--------+  Native artery inflow   229           641                 +--------------------+----------+-----------------+--------+  AVF Anastomosis        483                               +--------------------+----------+-----------------+--------+      +------------+----------+-------------+----------+-------------------------  ----+  OUTFLOW VEINPSV (cm/s)Diameter (cm)Depth (cm)          Describe              +------------+----------+-------------+----------+-------------------------  ----+  AC Fossa       147        0.49        0.24         competing branch          +------------+----------+-------------+----------+-------------------------  ----+  Prox Forearm    97        0.55        0.37          Retained valve             +------------+----------+-------------+----------+-------------------------  ----+  Mid Forearm    106        0.48        0.31                                   +------------+----------+-------------+----------+-------------------------  ----+  Dist Forearm   254        0.55        0.29   change in Diameter and  branch  +------------+----------+-------------+----------+-------------------------  ----+   Summary:  Patent arteriovenous fistula. Arteriovenous fistula-Velocities less than 100cm/s noted.  Medical Decision Making   SANJIT MCMICHAEL is a 36 y.o. year old male who presents s/p left radiocephaic arteriovenous fistula placement 09/02/21 by Dr. Carlis Abbott. Incision is well healed. Duplex shows patent fistula that has matured nicely. There is competing branch and retained valve noted. Fistula clinically is well appearing with good thrill. He does HD at home and will transition from using  TDC to fistula after completion of training.  Patent is without signs or symptoms of steal syndrome The patient's access will be ready for use after 12/02/21 The patient's tunneled dialysis catheter can be removed when Nephrology is comfortable with the performance of the left radiocephalic AV fistula The patient may follow up on a prn basis   Karoline Caldwell, PA-C [facility] [place]: [phone]  Clinic MD: Scot Dock

## 2021-11-19 ENCOUNTER — Ambulatory Visit (INDEPENDENT_AMBULATORY_CARE_PROVIDER_SITE_OTHER): Payer: BC Managed Care – PPO | Admitting: Physician Assistant

## 2021-11-19 VITALS — BP 133/92 | HR 99 | Temp 97.7°F | Resp 14 | Ht 71.0 in | Wt 205.4 lb

## 2021-11-19 DIAGNOSIS — Z992 Dependence on renal dialysis: Secondary | ICD-10-CM

## 2021-11-19 DIAGNOSIS — N186 End stage renal disease: Secondary | ICD-10-CM

## 2021-12-01 ENCOUNTER — Encounter: Payer: BC Managed Care – PPO | Admitting: Vascular Surgery

## 2021-12-21 ENCOUNTER — Other Ambulatory Visit: Payer: Self-pay | Admitting: *Deleted

## 2021-12-21 DIAGNOSIS — N186 End stage renal disease: Secondary | ICD-10-CM

## 2021-12-24 ENCOUNTER — Ambulatory Visit: Payer: BC Managed Care – PPO | Admitting: Vascular Surgery

## 2021-12-24 ENCOUNTER — Encounter (HOSPITAL_COMMUNITY): Payer: BC Managed Care – PPO

## 2022-01-04 ENCOUNTER — Other Ambulatory Visit: Payer: Self-pay | Admitting: *Deleted

## 2022-01-04 DIAGNOSIS — Z992 Dependence on renal dialysis: Secondary | ICD-10-CM

## 2022-01-04 DIAGNOSIS — N186 End stage renal disease: Secondary | ICD-10-CM

## 2022-01-08 ENCOUNTER — Other Ambulatory Visit: Payer: Self-pay

## 2022-01-08 ENCOUNTER — Ambulatory Visit (HOSPITAL_COMMUNITY)
Admission: RE | Admit: 2022-01-08 | Discharge: 2022-01-08 | Disposition: A | Discharge status: Home / Self Care | Payer: BC Managed Care – PPO | Source: Ambulatory Visit | Attending: Vascular Surgery | Admitting: Vascular Surgery

## 2022-01-08 ENCOUNTER — Ambulatory Visit: Payer: BC Managed Care – PPO | Admitting: Physician Assistant

## 2022-01-08 VITALS — BP 120/79 | HR 81 | Temp 98.2°F | Ht 72.0 in | Wt 210.0 lb

## 2022-01-08 DIAGNOSIS — N186 End stage renal disease: Secondary | ICD-10-CM | POA: Diagnosis not present

## 2022-01-08 DIAGNOSIS — I770 Arteriovenous fistula, acquired: Secondary | ICD-10-CM

## 2022-01-08 DIAGNOSIS — Z992 Dependence on renal dialysis: Secondary | ICD-10-CM

## 2022-01-08 NOTE — Progress Notes (Signed)
Office Note     CC:  follow up Requesting Provider:  Scheryl Marten, PA  HPI: Adrian Evans is a 36 y.o. (18-Dec-1985) male who presents for reevaluation of left radiocephalic fistula which was created by Dr. Carlis Abbott on 09/02/2021.  It has been slow to mature.  He underwent fistulogram recently with Dr. Posey Pronto demonstrating large collateral branches near the arterial anastomosis.  He is currently dialyzing via a TDC.  He has also had PD catheter placement by Dr. Stanford Breed in the past however catheter was never able to be used for peritoneal dialysis.   Past Medical History:  Diagnosis Date   Acute cerebral infarction [facility])    Acute CHF (congestive heart failure) ([place]) 05/22/2018   Acute encephalopathy 06/21/2020   Acute renal failure (ARF) ([place]) 15/17/6160   Chronic systolic congestive heart failure (HCC)    Diabetes mellitus type I ([place])    Diabetes mellitus without complication ([place])    Dysphagia, post-stroke    ESRD on dialysis [facility])    Essential hypertension    Iron deficiency anemia 04/16/2018    Past Surgical History:  Procedure Laterality Date   AV FISTULA PLACEMENT Left 09/02/2021   Procedure: LEFT ARM RADIOCEPHALIC ARTERIOVENOUS (AV) FISTULA CREATION;  Surgeon: [name], MD;  Location: [place];  Service: Vascular;  Laterality: Left;   CAPD INSERTION N/A 07/16/2021   Procedure: Diagnostic Laparoscopy;  Surgeon: Cherre Robins, MD;  Location: [place];  Service: Vascular;  Laterality: N/A;   EYE SURGERY     RIGHT/LEFT HEART CATH AND CORONARY ANGIOGRAPHY N/A 05/25/2018   Procedure: RIGHT/LEFT HEART CATH AND CORONARY ANGIOGRAPHY;  Surgeon: Corey Skains, MD;  Location: [place] [facility];  Service: Cardiovascular;  Laterality: N/A;   TONSILLECTOMY      Social History   Socioeconomic History   Marital status: Married    Spouse name: [name]   Number of children: 2   Years of education: Not on file   Highest education level: Not on file  Occupational History    Not on file  Tobacco Use   Smoking status: Never   Smokeless tobacco: Never  Vaping Use   Vaping Use: Never used  Substance and Sexual Activity   Alcohol use: Not Currently   Drug use: Not Currently   Sexual activity: Not Currently  Other Topics Concern   Not on file  Social History Narrative   School teacher   Social Determinants of Health   Financial Resource Strain: Not on file  Food Insecurity: Not on file  Transportation Needs: Not on file  Physical Activity: Not on file  Stress: Not on file  Social Connections: Not on file  Intimate Partner Violence: Not on file    Family History  Problem Relation Age of Onset   Hypertension Mother     Current Outpatient Medications  Medication Sig Dispense Refill   aspirin EC 81 MG tablet Take 81 mg by mouth in the morning. Swallow whole.     calcium acetate (PHOSLO) 667 MG tablet Take 1,334 mg by mouth with breakfast, with lunch, and with evening meal.     Cholecalciferol (VITAMIN D3) 50 MCG (2000 UT) TABS Take 4,000 Units by mouth in the morning.     ENTRESTO 24-26 MG Take 1 tablet by mouth 2 (two) times daily.     metoprolol succinate (TOPROL-XL) 50 MG 24 hr tablet Take 50 mg by mouth daily. Take with or immediately following a meal.     multivitamin (RENA-VIT)  TABS tablet Take 1 tablet by mouth daily.     carvedilol (COREG) 25 MG tablet Take 1 tablet (25 mg total) by mouth 2 (two) times daily with a meal. (Patient not taking: Reported on 01/08/2022) 60 tablet 0   furosemide (LASIX) 80 MG tablet Take 80 mg by mouth daily as needed (fluid retention.). (Patient not taking: Reported on 11/19/2021)     hydrALAZINE (APRESOLINE) 50 MG tablet TAKE 1 TABLET BY MOUTH IN THE MORNING AND AT BEDTIME (Patient not taking: Reported on 01/08/2022) 180 tablet 1   Insulin Pen Needle 31G X 5 MM MISC 1 Device by Does not apply route 3 (three) times daily as needed (for insulin pen). (Patient not taking: Reported on 11/19/2021) 100 each 0   No current  facility-administered medications for this visit.    No Known Allergies   REVIEW OF SYSTEMS:   [X]  denotes positive finding, [ ]  denotes negative finding Cardiac  Comments:  Chest pain or chest pressure:    Shortness of breath upon exertion:    Short of breath when lying flat:    Irregular heart rhythm:        Vascular    Pain in calf, thigh, or hip brought on by ambulation:    Pain in feet at night that wakes you up from your sleep:     Blood clot in your veins:    Leg swelling:         Pulmonary    Oxygen at home:    Productive cough:     Wheezing:         Neurologic    Sudden weakness in arms or legs:     Sudden numbness in arms or legs:     Sudden onset of difficulty speaking or slurred speech:    Temporary loss of vision in one eye:     Problems with dizziness:         Gastrointestinal    Blood in stool:     Vomited blood:         Genitourinary    Burning when urinating:     Blood in urine:        Psychiatric    Major depression:         Hematologic    Bleeding problems:    Problems with blood clotting too easily:        Skin    Rashes or ulcers:        Constitutional    Fever or chills:      PHYSICAL EXAMINATION:  Vitals:   01/08/22 1042  BP: 120/79  Pulse: 81  Temp: 98.2 F (36.8 C)  TempSrc: Temporal  Weight: 210 lb (95.3 kg)  Height: 6' (1.829 m)    General:  WDWN in NAD; vital signs documented above Gait: Not observed HENT: WNL, normocephalic Pulmonary: normal non-labored breathing , without Rales, rhonchi,  wheezing Cardiac: regular HR Abdomen: soft, NT, no masses Skin: without rashes Extremities: Palpable thrill through cephalic vein in the forearm; large palpable branch near the anastomosis also with a thrill Musculoskeletal: no muscle wasting or atrophy  Neurologic: A&O X 3;  No focal weakness or paresthesias are detected Psychiatric:  The pt has Normal affect.   Non-Invasive Vascular Imaging:   Patent radiocephalic  fistula with large collateral branches in the distal forearm    ASSESSMENT/PLAN:: 36 y.o. male here for reevaluation of left radiocephalic fistula which is slow to mature.  He underwent fistulogram with Dr. Posey Pronto demonstrating  large collateral branches in the distal forearm.  I offered the patient 2 options.  We can ligate side branches to help promote further maturity of the cephalic vein through the forearm or we could abandon radiocephalic fistula and jump up to the cephalic or basilic vein in the upper arm to create a new fistula.  Patient and his wife elected to proceed with sidebranch ligation of his current radiocephalic fistula.  Case was discussed and all questions were answered.  This will be scheduled next available with Dr. Stanford Breed or Dr. Carlis Abbott.    Dagoberto Ligas, PA-C [facility] [phone]  Clinic MD:   Virl Cagey

## 2022-01-12 ENCOUNTER — Encounter (HOSPITAL_COMMUNITY): Payer: Self-pay | Admitting: Physician Assistant

## 2022-01-12 ENCOUNTER — Other Ambulatory Visit: Payer: Self-pay

## 2022-01-12 ENCOUNTER — Encounter (HOSPITAL_COMMUNITY): Payer: Self-pay | Admitting: Vascular Surgery

## 2022-01-12 NOTE — Progress Notes (Signed)
PCP - Fredda Hammed, PA   Cardiologist - Kallie Edward, NP  Chest x-ray - n/a EKG - 12/16/21 CE (Requested) Stress Test - 05/07/21 ECHO - 12/10/21 CE (Requested) Cardiac Cath - 12/16/21 CE (Requested)  ICD Pacemaker/Loop - n/a  Sleep Study -  n/a CPAP - none  Diabetes Type 2 - No medication, diet controlled.  Patient does not check blood sugar.    Anesthesia review: Yes  STOP now taking any Aspirin (unless otherwise instructed by your surgeon), Aleve, Naproxen, Ibuprofen, Motrin, Advil, Goody's, BC's, all herbal medications, fish oil, and all vitamins.   Coronavirus Screening Does the patient have any of the following symptoms:  Cough yes/no: No Fever (>100.107F)  yes/no: No Runny nose yes/no: No Sore throat yes/no: No Difficulty breathing/shortness of breath  yes/no: No  Have you traveled in the last 14 days and where? yes/no: No  Wife [name] verbalized understanding of instructions that were given via phone.

## 2022-01-12 NOTE — Progress Notes (Signed)
Anesthesia Chart Review: Same day workup  Patient follows at [facility] for history of HFrEF, nonischemic cardiomyopathy, HTN.  Echo 05/07/2021 showed EF 30 to 23%, grade 3 diastolic dysfunction, RV systolic function mildly reduced.  Nuclear stress 05/07/2021 findings consistent with prior MRI, no ischemia.  Cardiopulmonary stress test 07/20/2021 showed severe functional impairment with peak VO2 of 21.4% of predicted normal.  He was last seen 12/10/2021 and updated echo was ordered at that time.  Echo 12/10/2021 showed worsened LV systolic function with EF 14%, moderate RV systolic dysfunction, no significant valvular abnormalities.  Based on this, right heart cath was ordered which showed normal (low) filling pressures with normal CO/CI in setting of AVF.  Recommended continue medical therapy for cardiomyopathy.  Patient was referred to the advanced heart failure clinic for further management, has not yet been seen.  I spoke with Dr. Stanford Breed regarding patient's recently worsened systolic heart failure.  He recommended patient complete evaluation with advanced heart failure team at [facility] prior to undergoing procedure.  Procedure to be postponed at this time to allow for further work-up.  Right heart cath 12/16/2021 (Care Everywhere): Impressions:  Normal (low) filling pressures with normal CO/CI in setting of AVF.   Recommendations:   Continue medical therapy of cardiomyopathy.   TTE 12/10/2021 (Care Everywhere): INTERPRETATION ---------------------------------------------------------------    SEVERE LV DYSFUNCTION (See above) WITH MILD LVH (calculated EF 14%)   MODERATE RV SYSTOLIC DYSFUNCTION (See above)    VALVULAR REGURGITATION: TRIVIAL AR, TRIVIAL MR, TRIVIAL PR, TRIVIAL TR    NO VALVULAR STENOSIS    NO PRIOR STUDY FOR COMPARISON    3D acquisition and reconstructions were performed as part of this    examination to more accurately quantify the effects of reduced left    ventricular ejection  fraction. (post-processing on an Independent    workstation).   Cardiopulmonary stress test 07/20/2021 (Care Everywhere):   1) The data suggests a maximal exercise test, so maximal    cardiorespiratory capacity could be determined.    2) Based on the peak oxygen consumption (VO2) achieved, functional capacity    would be consistent with a severe functional impairment, with peak VO2 7.5    ml/kg/min (21.4% of predicted normals). Normative peak predicted VO2    values are corrected for age, gender, and weight.    3) There were maximal signs of a circulatory limitation to exercise.    VE-VCO2 slope was 41.4 (163.9% of predicted normals) with an OUES of 966.    ECG notable for baseline normal sinus rhythm with an intraventricular    conduction delay and left anterior fascicular block. Unifocal PVC's were    noted during the test. There were no ST segment changes during the test.    The heart rate recovery was abnormal.    4) Aerobic reserve was poor due to physical deconditioning limiting    exercise capacity.    5) Ventilatory reserve limits were not approached at peak exercise without    apparent pulmonary limitation to exercise. Resting spirometry suggests a    restrictive pulmonary physiology. The MVV reflects the reduced FEV1.    Exercise oscillatory ventilation criteria was not met.    6) The O2 pulse (an indicator of stroke volume) began low and flat, but    began to increase with further exercise, finally plateauing around HR of 80    bpm.    7) No prior CPX available for comparison.    This is the metabolic report for the Cardiopulmonary Exercise Test.  The    Stress ECG report is available on a separate report.    Wynonia Musty [facility]/Anesthesiology Phone [phone] 01/12/2022 3:51 PM

## 2022-01-13 ENCOUNTER — Ambulatory Visit (HOSPITAL_COMMUNITY)
Admission: RE | Admit: 2022-01-13 | Payer: BC Managed Care – PPO | Source: Home / Self Care | Admitting: Vascular Surgery

## 2022-01-13 SURGERY — LIGATION OF COMPETING BRANCHES OF ARTERIOVENOUS FISTULA
Anesthesia: Monitor Anesthesia Care | Laterality: Left

## 2022-01-20 IMAGING — CT CT ABD-PELV W/O CM
2 of 4 series · 16 of 46 positions shown, 18 images · non-contrast
Comparison: None.

CLINICAL DATA: Renal parenchymal disease with hematuria Preop
clearance renal transplant ([id number])

EXAM:
CT ABDOMEN AND PELVIS WITHOUT CONTRAST
TECHNIQUE: Multidetector CT imaging of the abdomen and pelvis was performed
following the standard protocol without IV contrast.

[Series 2: abd pel wo · axial · 0.82mm/px · z∈[+720,+1150]mm · 13 of 94 slices shown, 15 images]
[im 4/94  soft-tissue]
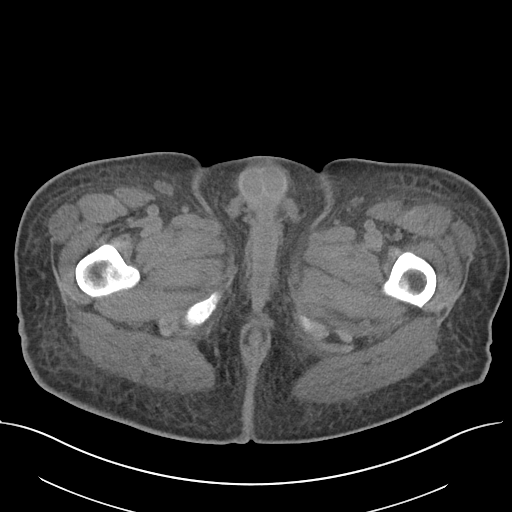
[im 4/94  bone]
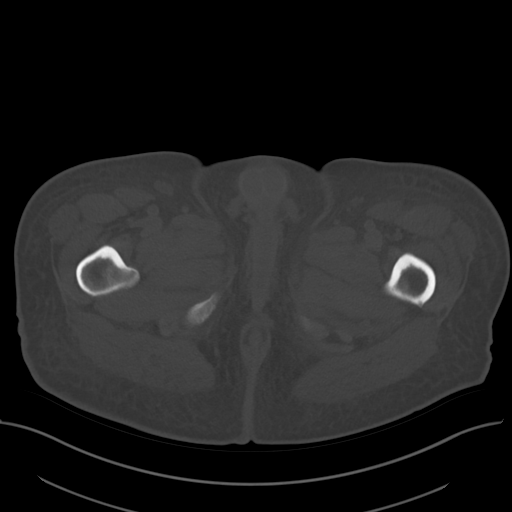
[im 12/94  soft-tissue]
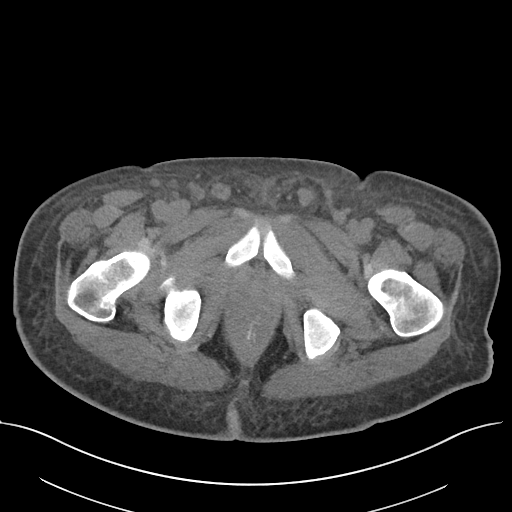
[im 19/94  soft-tissue]
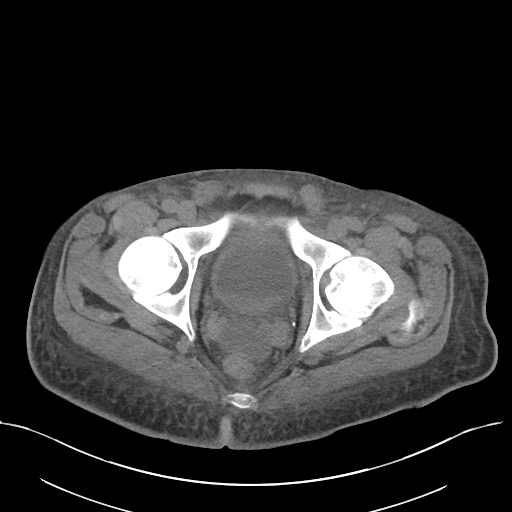
[im 27/94  soft-tissue]
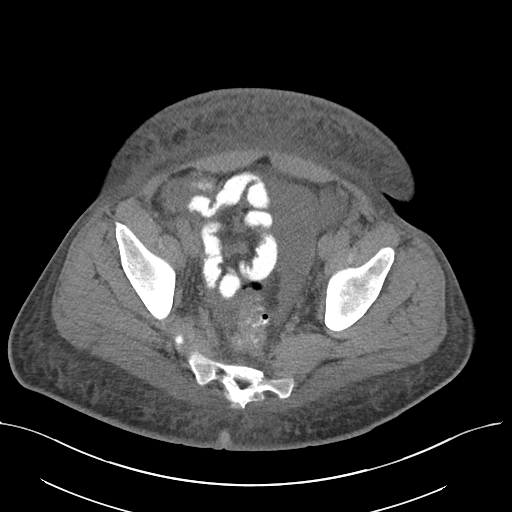
[im 34/94  soft-tissue]
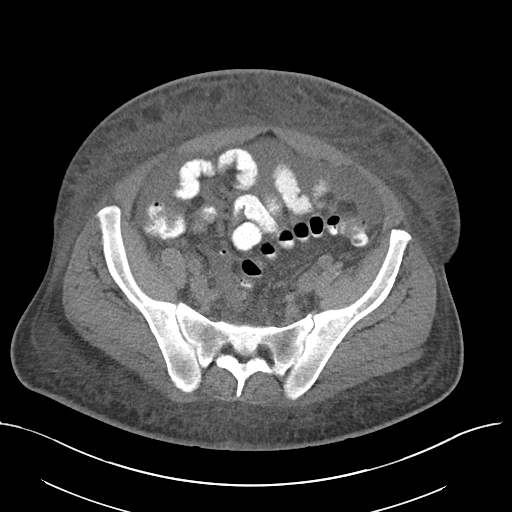
[im 41/94  soft-tissue]
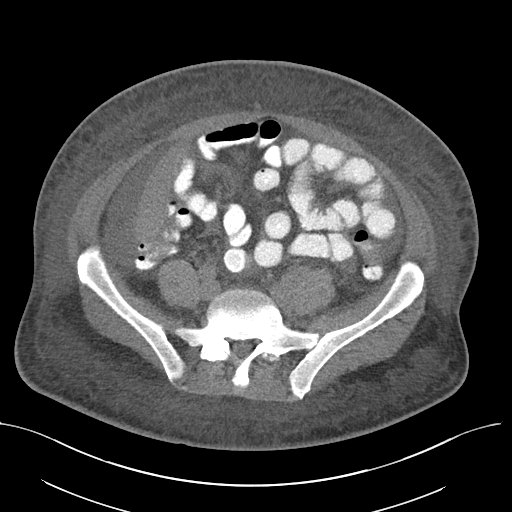
[im 49/94  soft-tissue]
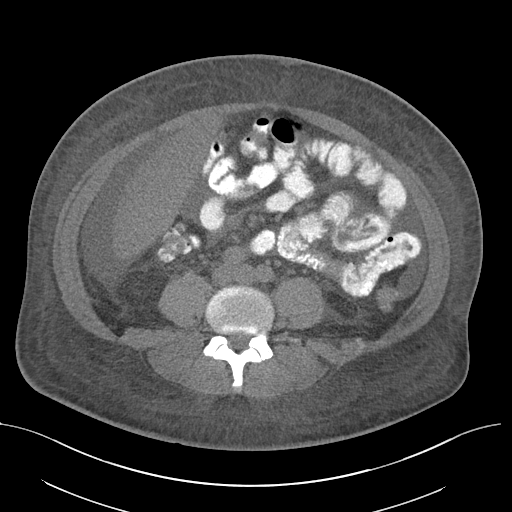
[im 53/94  soft-tissue]
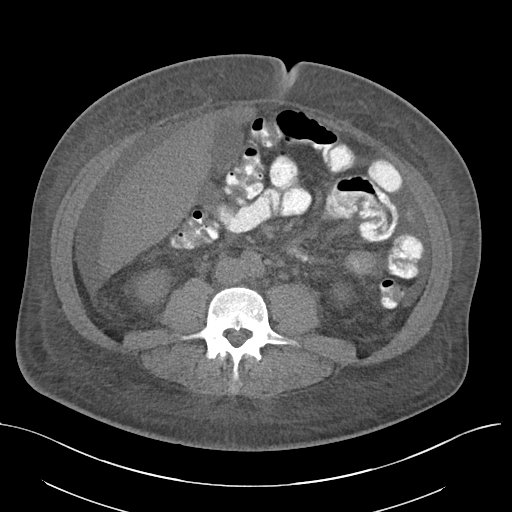
[im 60/94  soft-tissue]
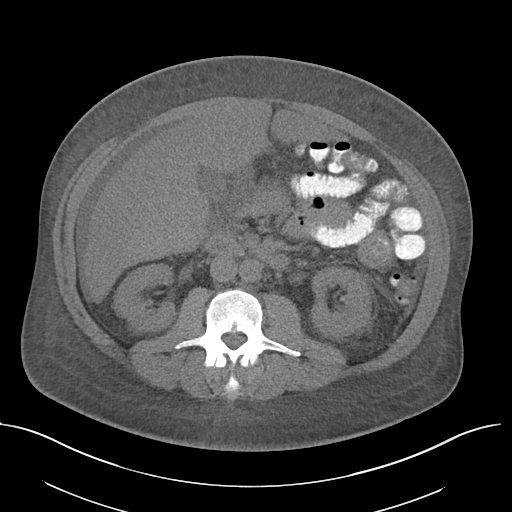
[im 60/94  bone]
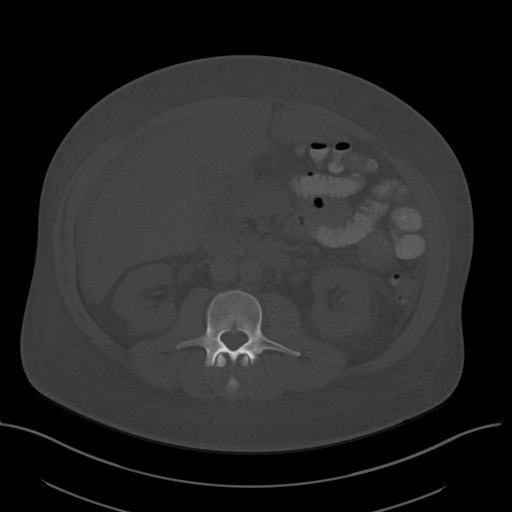
[im 67/94  soft-tissue]
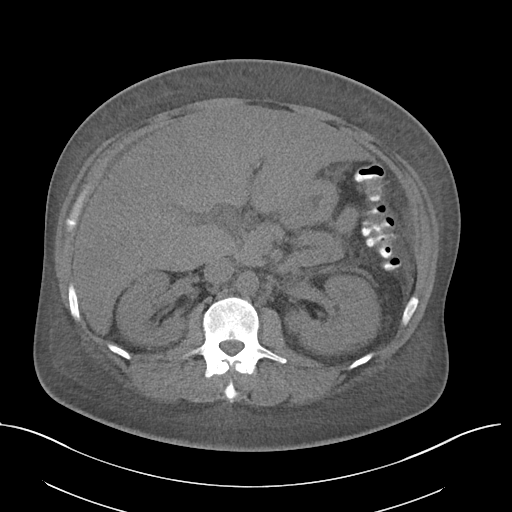
[im 75/94  soft-tissue]
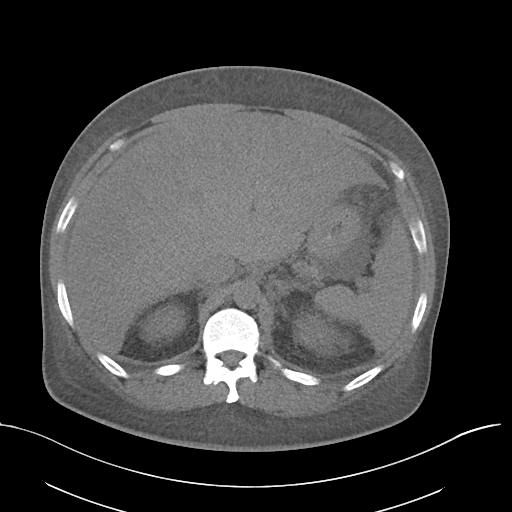
[im 82/94  soft-tissue]
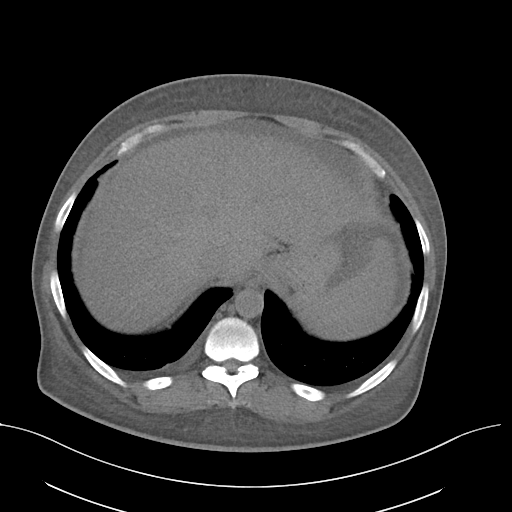
[im 90/94  soft-tissue]
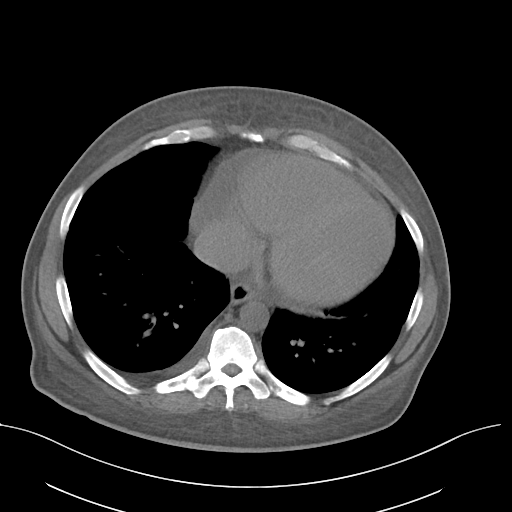

[Series 5: coronal · coronal · 0.81mm/px · 3 of 117 slices shown]
[im 39/117  soft-tissue]
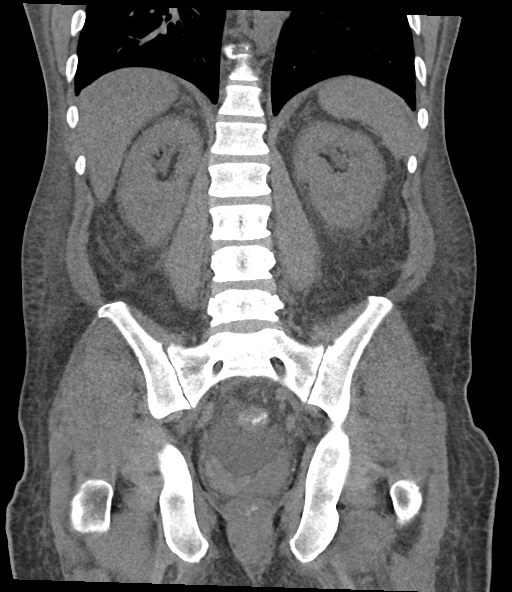
[im 52/117  soft-tissue]
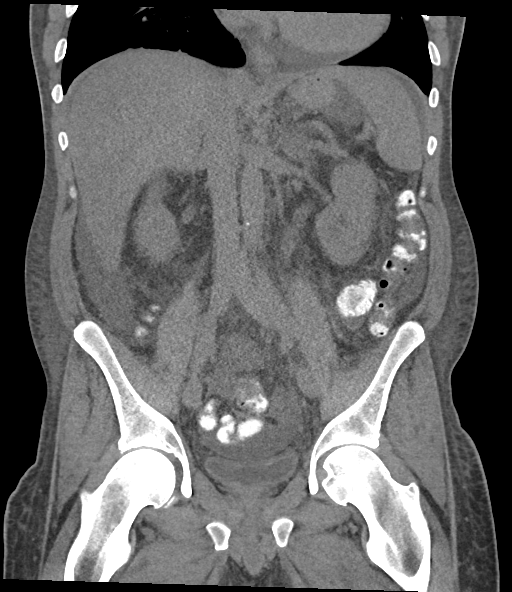
[im 65/117  soft-tissue]
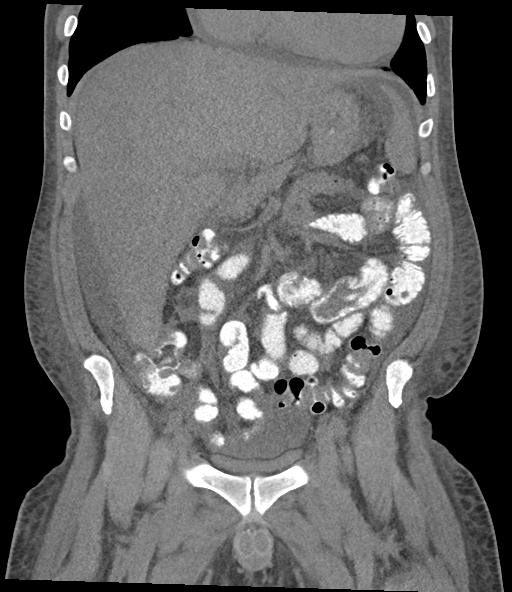

[16 of 46 positions shown; findings below may reference images not displayed]

FINDINGS: Lower chest: Trace right pleural effusion. No confluent opacity in
the lung bases.

Hepatobiliary: Heterogeneous, slightly low-density throughout the
liver suggesting possibility of fatty infiltration. No visible focal
abnormality. Gallbladder unremarkable.

Pancreas: No focal abnormality or ductal dilatation.

Spleen: No focal abnormality.  Normal size.

Adrenals/Urinary Tract: No adrenal abnormality. No focal renal
abnormality. No stones or hydronephrosis. Urinary bladder is
unremarkable.

Stomach/Bowel: Normal appendix. Stomach, large and small bowel
grossly unremarkable.

Vascular/Lymphatic: No evidence of aneurysm or adenopathy.

Reproductive: No visible focal abnormality.

Other: Moderate free fluid throughout the abdomen and pelvis.
Diffuse anasarca like edema throughout the abdominal wall.

Musculoskeletal: No acute bony abnormality.
IMPRESSION: Heterogeneous appearance of the liver with slight low-density
diffusely suggesting fatty infiltration.

Moderate ascites in the abdomen and pelvis. Anasarca like edema
throughout the abdominal wall.

No renal or ureteral stones.  No hydronephrosis.

Trace right pleural effusion.

## 2022-03-02 ENCOUNTER — Telehealth: Payer: Self-pay

## 2022-03-02 NOTE — Telephone Encounter (Signed)
Patient seen by Dr. Melvyn Novas at [facility] for cardiac clearance prior to left arm side branch ligation. Per Dr. Mosetta Pigeon, need to discuss with vascular surgeon as he is increased risk of cardiac complications.   Informed Dr. Stanford Breed of recommendations. He recommended to hold off any surgery and for patient to continue to use his dialysis catheter for now until his heart has optimized. Spoke with patient's wife regarding provider recommendations. She verbalized understanding and will relay information to patient. States will contact office to reschedule when this occurs.

## 2022-03-22 ENCOUNTER — Encounter (INDEPENDENT_AMBULATORY_CARE_PROVIDER_SITE_OTHER): Payer: Self-pay

## 2022-04-20 ENCOUNTER — Other Ambulatory Visit: Payer: Self-pay

## 2022-04-20 DIAGNOSIS — I770 Arteriovenous fistula, acquired: Secondary | ICD-10-CM

## 2022-04-20 NOTE — Telephone Encounter (Signed)
Received a call from [place] at [facility] advising that patient should be cleared from cardiology to proceed with branch ligation of fistula. Requested Adrian Caldwell, PA to review last office visit from [facility] on 04/09/22. He verified looks good to proceed.   Spoke with patient's wife Adrian Evans. She requested to reschedule patient for 04/28/22. Instructions reviewed- she verbalized understanding. Dr. Stanford Breed made aware patient is now clear to proceed with surgery.

## 2022-04-20 NOTE — Telephone Encounter (Signed)
Patient's wife Adrian Evans called back requesting to move surgery up to 04/26/22, if available. Surgery rescheduled with arrival time of 0530 to [facility] admitting. Voiced understanding.

## 2022-04-23 NOTE — Progress Notes (Addendum)
I was not able to reach Adrian Evans, the cell phone number listed is his wife's and the mail box is full- checked x 2. I left a voice message on the home number. I instructed patient to arrive at 0530, npo after midnight Sunday.  Medications to take on Monday - ASA, Metoprolol. I gave hygiene instructions and that he needs a driver and someone to stay with him the first 24 hours after surgery.

## 2022-04-25 NOTE — Anesthesia Preprocedure Evaluation (Addendum)
Anesthesia Evaluation  Patient identified by MRN, date of birth, ID band Patient awake    Reviewed: Allergy & Precautions, NPO status , Patient's Chart, lab work & pertinent test results, reviewed documented beta blocker date and time   History of Anesthesia Complications Negative for: history of anesthetic complications  Airway Mallampati: II  TM Distance: >3 FB Neck ROM: Full    Dental  (+) Missing, Dental Advisory Given   Pulmonary neg pulmonary ROS   breath sounds clear to auscultation       Cardiovascular hypertension, Pt. on medications and Pt. on home beta blockers (-) angina +CHF Delene Loll)   Rhythm:Regular Rate:Normal  04/2021 ECHO: EF 30-35%, global hypokinesis, Grade 3 DD, mildly reduced RVF, mild MR, mod TR, mild AI   Neuro/Psych CVA, No Residual Symptoms    GI/Hepatic negative GI ROS, Neg liver ROS,,,  Endo/Other  diabetes, Insulin Dependent    Renal/GU ESRF and DialysisRenal diseaseK+ 4.2     Musculoskeletal   Abdominal   Peds  Hematology negative hematology ROS (+)   Anesthesia Other Findings   Reproductive/Obstetrics                              Anesthesia Physical Anesthesia Plan  ASA: 3  Anesthesia Plan: MAC and Regional   Post-op Pain Management: Tylenol PO (pre-op)*   Induction:   PONV Risk Score and Plan: 1 and Ondansetron and Treatment may vary due to age or medical condition  Airway Management Planned: Natural Airway and Simple Face Mask  Additional Equipment: None  Intra-op Plan:   Post-operative Plan:   Informed Consent: I have reviewed the patients History and Physical, chart, labs and discussed the procedure including the risks, benefits and alternatives for the proposed anesthesia with the patient or authorized representative who has indicated his/her understanding and acceptance.     Dental advisory given  Plan Discussed with: CRNA and  Surgeon  Anesthesia Plan Comments: (Supraclavicular block)         Anesthesia Quick Evaluation

## 2022-04-26 ENCOUNTER — Ambulatory Visit (HOSPITAL_COMMUNITY)
Admission: RE | Admit: 2022-04-26 | Discharge: 2022-04-26 | Disposition: A | Discharge status: Home / Self Care | Payer: BC Managed Care – PPO | Attending: Vascular Surgery | Admitting: Vascular Surgery

## 2022-04-26 ENCOUNTER — Ambulatory Visit (HOSPITAL_COMMUNITY): Payer: BC Managed Care – PPO | Admitting: Anesthesiology

## 2022-04-26 ENCOUNTER — Other Ambulatory Visit (HOSPITAL_COMMUNITY): Payer: Self-pay

## 2022-04-26 ENCOUNTER — Encounter (HOSPITAL_COMMUNITY): Payer: Self-pay | Admitting: Vascular Surgery

## 2022-04-26 ENCOUNTER — Encounter: Payer: Self-pay | Admitting: Oncology

## 2022-04-26 ENCOUNTER — Other Ambulatory Visit: Payer: Self-pay

## 2022-04-26 ENCOUNTER — Encounter (HOSPITAL_COMMUNITY)
Admission: RE | Disposition: A | Discharge status: Home / Self Care | Payer: Self-pay | Source: Home / Self Care | Attending: Vascular Surgery

## 2022-04-26 DIAGNOSIS — I5022 Chronic systolic (congestive) heart failure: Secondary | ICD-10-CM | POA: Diagnosis not present

## 2022-04-26 DIAGNOSIS — T82898A Other specified complication of vascular prosthetic devices, implants and grafts, initial encounter: Secondary | ICD-10-CM | POA: Diagnosis present

## 2022-04-26 DIAGNOSIS — E1022 Type 1 diabetes mellitus with diabetic chronic kidney disease: Secondary | ICD-10-CM | POA: Insufficient documentation

## 2022-04-26 DIAGNOSIS — I770 Arteriovenous fistula, acquired: Secondary | ICD-10-CM

## 2022-04-26 DIAGNOSIS — Z992 Dependence on renal dialysis: Secondary | ICD-10-CM | POA: Insufficient documentation

## 2022-04-26 DIAGNOSIS — X58XXXA Exposure to other specified factors, initial encounter: Secondary | ICD-10-CM | POA: Diagnosis not present

## 2022-04-26 DIAGNOSIS — N186 End stage renal disease: Secondary | ICD-10-CM | POA: Diagnosis not present

## 2022-04-26 DIAGNOSIS — I132 Hypertensive heart and chronic kidney disease with heart failure and with stage 5 chronic kidney disease, or end stage renal disease: Secondary | ICD-10-CM | POA: Diagnosis not present

## 2022-04-26 DIAGNOSIS — Z794 Long term (current) use of insulin: Secondary | ICD-10-CM | POA: Insufficient documentation

## 2022-04-26 DIAGNOSIS — Z8673 Personal history of transient ischemic attack (TIA), and cerebral infarction without residual deficits: Secondary | ICD-10-CM | POA: Insufficient documentation

## 2022-04-26 DIAGNOSIS — N185 Chronic kidney disease, stage 5: Secondary | ICD-10-CM

## 2022-04-26 HISTORY — PX: LIGATION OF COMPETING BRANCHES OF ARTERIOVENOUS FISTULA: SHX5949

## 2022-04-26 LAB — POCT I-STAT, CHEM 8
BUN: 51 mg/dL — ABNORMAL HIGH (ref 6–20)
Calcium, Ion: 0.98 mmol/L — ABNORMAL LOW (ref 1.15–1.40)
Chloride: 96 mmol/L — ABNORMAL LOW (ref 98–111)
Creatinine, Ser: 10.6 mg/dL — ABNORMAL HIGH (ref 0.61–1.24)
Glucose, Bld: 296 mg/dL — ABNORMAL HIGH (ref 70–99)
HCT: 33 % — ABNORMAL LOW (ref 39.0–52.0)
Hemoglobin: 11.2 g/dL — ABNORMAL LOW (ref 13.0–17.0)
Potassium: 4.2 mmol/L (ref 3.5–5.1)
Sodium: 135 mmol/L (ref 135–145)
TCO2: 26 mmol/L (ref 22–32)

## 2022-04-26 LAB — GLUCOSE, CAPILLARY
Glucose-Capillary: 277 mg/dL — ABNORMAL HIGH (ref 70–99)
Glucose-Capillary: 277 mg/dL — ABNORMAL HIGH (ref 70–99)

## 2022-04-26 SURGERY — LIGATION OF COMPETING BRANCHES OF ARTERIOVENOUS FISTULA
Anesthesia: Monitor Anesthesia Care | Laterality: Left

## 2022-04-26 MED ORDER — INSULIN ASPART 100 UNIT/ML IJ SOLN
0.0000 [IU] | INTRAMUSCULAR | Status: DC | PRN
  Administered 2022-04-26: 4 [IU] via SUBCUTANEOUS
  Filled 2022-04-26: qty 1

## 2022-04-26 MED ORDER — PROPOFOL 500 MG/50ML IV EMUL
INTRAVENOUS | Status: DC | PRN
  Administered 2022-04-26: 50 ug/kg/min via INTRAVENOUS

## 2022-04-26 MED ORDER — OXYCODONE HCL 5 MG/5ML PO SOLN: 5.0000 mg | Freq: Once | ORAL | Status: DC | PRN

## 2022-04-26 MED ORDER — ORAL CARE MOUTH RINSE: 15.0000 mL | Freq: Once | OROMUCOSAL | Status: DC

## 2022-04-26 MED ORDER — PROMETHAZINE HCL 25 MG/ML IJ SOLN: 6.2500 mg | INTRAMUSCULAR | Status: DC | PRN

## 2022-04-26 MED ORDER — LACTATED RINGERS IV SOLN: INTRAVENOUS | Status: DC

## 2022-04-26 MED ORDER — HEPARIN 6000 UNIT IRRIGATION SOLUTION
Status: AC
  Filled 2022-04-26: qty 500

## 2022-04-26 MED ORDER — CHLORHEXIDINE GLUCONATE 0.12 % MT SOLN: 15.0000 mL | Freq: Once | OROMUCOSAL | Status: DC

## 2022-04-26 MED ORDER — FENTANYL CITRATE (PF) 250 MCG/5ML IJ SOLN
INTRAMUSCULAR | Status: AC
  Filled 2022-04-26: qty 5

## 2022-04-26 MED ORDER — LIDOCAINE HCL (PF) 1 % IJ SOLN
INTRAMUSCULAR | Status: AC
  Filled 2022-04-26: qty 30

## 2022-04-26 MED ORDER — CEFAZOLIN SODIUM-DEXTROSE 2-4 GM/100ML-% IV SOLN
2.0000 g | INTRAVENOUS | Status: AC
  Administered 2022-04-26: 2 g via INTRAVENOUS
  Filled 2022-04-26: qty 100

## 2022-04-26 MED ORDER — MEPERIDINE HCL 25 MG/ML IJ SOLN: 6.2500 mg | INTRAMUSCULAR | Status: DC | PRN

## 2022-04-26 MED ORDER — SODIUM CHLORIDE 0.9 % IV SOLN: INTRAVENOUS | Status: DC

## 2022-04-26 MED ORDER — MIDAZOLAM HCL 2 MG/2ML IJ SOLN
INTRAMUSCULAR | Status: DC | PRN
  Administered 2022-04-26 (×2): 1 mg via INTRAVENOUS

## 2022-04-26 MED ORDER — 0.9 % SODIUM CHLORIDE (POUR BTL) OPTIME
TOPICAL | Status: DC | PRN
  Administered 2022-04-26: 1000 mL

## 2022-04-26 MED ORDER — LIDOCAINE-EPINEPHRINE (PF) 1 %-1:200000 IJ SOLN
INTRAMUSCULAR | Status: AC
  Filled 2022-04-26: qty 30

## 2022-04-26 MED ORDER — FENTANYL CITRATE (PF) 250 MCG/5ML IJ SOLN
INTRAMUSCULAR | Status: DC | PRN
  Administered 2022-04-26: 100 ug via INTRAVENOUS

## 2022-04-26 MED ORDER — OXYCODONE HCL 5 MG PO TABS: 5.0000 mg | ORAL_TABLET | Freq: Once | ORAL | Status: DC | PRN

## 2022-04-26 MED ORDER — ONDANSETRON HCL 4 MG/2ML IJ SOLN
INTRAMUSCULAR | Status: AC
  Filled 2022-04-26: qty 2

## 2022-04-26 MED ORDER — MIDAZOLAM HCL 2 MG/2ML IJ SOLN: 0.5000 mg | Freq: Once | INTRAMUSCULAR | Status: DC | PRN

## 2022-04-26 MED ORDER — ONDANSETRON HCL 4 MG/2ML IJ SOLN
INTRAMUSCULAR | Status: DC | PRN
  Administered 2022-04-26: 4 mg via INTRAVENOUS

## 2022-04-26 MED ORDER — PHENYLEPHRINE 80 MCG/ML (10ML) SYRINGE FOR IV PUSH (FOR BLOOD PRESSURE SUPPORT)
PREFILLED_SYRINGE | INTRAVENOUS | Status: AC
  Filled 2022-04-26: qty 10

## 2022-04-26 MED ORDER — OXYCODONE-ACETAMINOPHEN 5-325 MG PO TABS
1.0000 | ORAL_TABLET | Freq: Four times a day (QID) | ORAL | 0 refills | Status: DC | PRN
  Filled 2022-04-26: qty 12, 3d supply, fill #0

## 2022-04-26 MED ORDER — PROPOFOL 1000 MG/100ML IV EMUL
INTRAVENOUS | Status: AC
  Filled 2022-04-26: qty 100

## 2022-04-26 MED ORDER — MIDAZOLAM HCL 2 MG/2ML IJ SOLN
INTRAMUSCULAR | Status: AC
  Filled 2022-04-26: qty 2

## 2022-04-26 MED ORDER — INSULIN ASPART 100 UNIT/ML IJ SOLN
4.0000 [IU] | Freq: Once | INTRAMUSCULAR | Status: AC
  Administered 2022-04-26: 4 [IU] via SUBCUTANEOUS

## 2022-04-26 MED ORDER — FENTANYL CITRATE (PF) 100 MCG/2ML IJ SOLN: 25.0000 ug | INTRAMUSCULAR | Status: DC | PRN

## 2022-04-26 MED ORDER — PHENYLEPHRINE 80 MCG/ML (10ML) SYRINGE FOR IV PUSH (FOR BLOOD PRESSURE SUPPORT)
PREFILLED_SYRINGE | INTRAVENOUS | Status: DC | PRN
  Administered 2022-04-26 (×2): 80 ug via INTRAVENOUS

## 2022-04-26 MED ORDER — LIDOCAINE-EPINEPHRINE (PF) 1.5 %-1:200000 IJ SOLN
INTRAMUSCULAR | Status: DC | PRN
  Administered 2022-04-26: 30 mL via PERINEURAL

## 2022-04-26 MED ORDER — ACETAMINOPHEN 500 MG PO TABS
1000.0000 mg | ORAL_TABLET | Freq: Once | ORAL | Status: AC
  Administered 2022-04-26: 1000 mg via ORAL
  Filled 2022-04-26: qty 2

## 2022-04-26 MED ORDER — CHLORHEXIDINE GLUCONATE 4 % EX LIQD: 60.0000 mL | Freq: Once | CUTANEOUS | Status: DC

## 2022-04-26 MED ORDER — HEPARIN 6000 UNIT IRRIGATION SOLUTION
Status: DC | PRN
  Administered 2022-04-26: 1

## 2022-04-26 SURGICAL SUPPLY — 38 items
ARMBAND PINK RESTRICT EXTREMIT (MISCELLANEOUS) ×1 IMPLANT
BAG COUNTER SPONGE SURGICOUNT (BAG) ×1 IMPLANT
BENZOIN TINCTURE PRP APPL 2/3 (GAUZE/BANDAGES/DRESSINGS) ×1 IMPLANT
CANISTER SUCT 3000ML PPV (MISCELLANEOUS) ×1 IMPLANT
CANNULA VESSEL 3MM 2 BLNT TIP (CANNULA) ×1 IMPLANT
CHLORAPREP W/TINT 26 (MISCELLANEOUS) ×1 IMPLANT
CLIP LIGATING EXTRA MED SLVR (CLIP) IMPLANT
CLIP LIGATING EXTRA SM BLUE (MISCELLANEOUS) IMPLANT
COVER PROBE W GEL 5X96 (DRAPES) IMPLANT
DRSG COVADERM 4X6 (GAUZE/BANDAGES/DRESSINGS) IMPLANT
ELECT REM PT RETURN 9FT ADLT (ELECTROSURGICAL) ×1
ELECTRODE REM PT RTRN 9FT ADLT (ELECTROSURGICAL) ×1 IMPLANT
GLOVE BIO SURGEON STRL SZ8 (GLOVE) ×1 IMPLANT
GLOVE BIOGEL PI IND STRL 6.5 (GLOVE) IMPLANT
GLOVE BIOGEL PI IND STRL 7.0 (GLOVE) IMPLANT
GLOVE BIOGEL PI IND STRL 7.5 (GLOVE) IMPLANT
GLOVE ECLIPSE 6.5 STRL STRAW (GLOVE) IMPLANT
GLOVE ECLIPSE 7.0 STRL STRAW (GLOVE) IMPLANT
GOWN STRL REUS W/ TWL LRG LVL3 (GOWN DISPOSABLE) ×2 IMPLANT
GOWN STRL REUS W/ TWL XL LVL3 (GOWN DISPOSABLE) ×1 IMPLANT
GOWN STRL REUS W/TWL LRG LVL3 (GOWN DISPOSABLE) ×2
GOWN STRL REUS W/TWL XL LVL3 (GOWN DISPOSABLE) ×1
INSERT FOGARTY SM (MISCELLANEOUS) IMPLANT
KIT BASIN OR (CUSTOM PROCEDURE TRAY) ×1 IMPLANT
KIT TURNOVER KIT B (KITS) ×1 IMPLANT
LOOP VESSEL MINI RED (MISCELLANEOUS) IMPLANT
NS IRRIG 1000ML POUR BTL (IV SOLUTION) ×1 IMPLANT
PACK CV ACCESS (CUSTOM PROCEDURE TRAY) ×1 IMPLANT
PAD ARMBOARD 7.5X6 YLW CONV (MISCELLANEOUS) ×2 IMPLANT
SLING ARM FOAM STRAP LRG (SOFTGOODS) IMPLANT
STRIP CLOSURE SKIN 1/2X4 (GAUZE/BANDAGES/DRESSINGS) ×1 IMPLANT
SUT MNCRL AB 4-0 PS2 18 (SUTURE) ×1 IMPLANT
SUT PROLENE 6 0 BV (SUTURE) ×1 IMPLANT
SUT VIC AB 3-0 SH 27 (SUTURE) ×1
SUT VIC AB 3-0 SH 27X BRD (SUTURE) ×1 IMPLANT
TOWEL GREEN STERILE (TOWEL DISPOSABLE) ×1 IMPLANT
UNDERPAD 30X36 HEAVY ABSORB (UNDERPADS AND DIAPERS) ×1 IMPLANT
WATER STERILE IRR 1000ML POUR (IV SOLUTION) ×1 IMPLANT

## 2022-04-26 NOTE — Transfer of Care (Signed)
Immediate Anesthesia Transfer of Care Note  Patient: BRIANNA ESSON  Procedure(s) Performed: LEFT ARTERIOVENOUS FISTULA REVISION (Left)  Patient Location: PACU  Anesthesia Type:MAC combined with regional for post-op pain  Level of Consciousness: awake and alert   Airway & Oxygen Therapy: Patient Spontanous Breathing  Post-op Assessment: Report given to RN and Post -op Vital signs reviewed and stable  Post vital signs: Reviewed and stable  Last Vitals:  Vitals Value Taken Time  BP 125/79 04/26/22 0840  Temp    Pulse 76 04/26/22 0840  Resp 23 04/26/22 0840  SpO2 98 % 04/26/22 0840  Vitals shown include unvalidated device data.  Last Pain:  Vitals:   04/26/22 0612  TempSrc:   PainSc: 0-No pain         Complications: No notable events documented.

## 2022-04-26 NOTE — H&P (Signed)
VASCULAR AND VEIN SPECIALISTS OF [place]  ASSESSMENT / PLAN: 36 y.o. male with poorly maturing left radiocephalic fistula. Plan sidebranch ligation in OR today. All questions answered.   CHIEF COMPLAINT: slowly maturing AVF  HISTORY OF PRESENT ILLNESS: Adrian Evans is a 36 y.o. male with end-stage renal disease dialyzing via right IJ tunneled dialysis catheter.  He has a slowly maturing left radiocephalic arteriovenous fistula.  He is planned to undergo sidebranch ligation earlier, but has suffered deterioration of his cardiac status.  He is currently under consideration of combined heart and renal transplant.  We reviewed the plan for sidebranch ligation today under regional anesthesia.  He is understanding and wishes to proceed.   Past Medical History:  Diagnosis Date   Acute cerebral infarction [facility])    Acute CHF (congestive heart failure) ([place]) 05/22/2018   Acute encephalopathy 06/21/2020   Acute renal failure (ARF) ([place]) 24/40/1027   Chronic systolic congestive heart failure (HCC)    Diabetes mellitus type I ([place])    Diabetes mellitus without complication ([place])    Dysphagia, post-stroke    ESRD on dialysis ([place])    Mon, Tues, Fri @ home - last dialysis on 01/12/22   Essential hypertension    Iron deficiency anemia 04/16/2018    Past Surgical History:  Procedure Laterality Date   AV FISTULA PLACEMENT Left 09/02/2021   Procedure: LEFT ARM RADIOCEPHALIC ARTERIOVENOUS (AV) FISTULA CREATION;  Surgeon: [name], MD;  Location: [place];  Service: Vascular;  Laterality: Left;   CAPD INSERTION N/A 07/16/2021   Procedure: Diagnostic Laparoscopy;  Surgeon: Cherre Robins, MD;  Location: [place];  Service: Vascular;  Laterality: N/A;   EYE SURGERY     RIGHT/LEFT HEART CATH AND CORONARY ANGIOGRAPHY N/A 05/25/2018   Procedure: RIGHT/LEFT HEART CATH AND CORONARY ANGIOGRAPHY;  Surgeon: Corey Skains, MD;  Location: [place] [facility];  Service: Cardiovascular;  Laterality:  N/A;   TONSILLECTOMY      Family History  Problem Relation Age of Onset   Hypertension Mother     Social History   Socioeconomic History   Marital status: Married    Spouse name: [name]   Number of children: 2   Years of education: Not on file   Highest education level: Not on file  Occupational History   Not on file  Tobacco Use   Smoking status: Never   Smokeless tobacco: Never  Vaping Use   Vaping Use: Never used  Substance and Sexual Activity   Alcohol use: Not Currently   Drug use: Not Currently   Sexual activity: Yes  Other Topics Concern   Not on file  Social History Narrative   Education officer, museum   Social Determinants of Health   Financial Resource Strain: Not on file  Food Insecurity: Not on file  Transportation Needs: Not on file  Physical Activity: Not on file  Stress: Not on file  Social Connections: Not on file  Intimate Partner Violence: Not on file    No Known Allergies  Current Facility-Administered Medications  Medication Dose Route Frequency Provider Last Rate Last Admin   0.9 %  sodium chloride infusion   Intravenous Continuous Cherre Robins, MD   New Bag at 04/26/22 0657   ceFAZolin (ANCEF) IVPB 2g/100 mL premix  2 g Intravenous 30 min Pre-Op Cherre Robins, MD       chlorhexidine (HIBICLENS) 4 % liquid 4 Application  60 mL Topical Once Cherre Robins, MD  And   [START ON 04/27/2022] chlorhexidine (HIBICLENS) 4 % liquid 4 Application  60 mL Topical Once Cherre Robins, MD       chlorhexidine (PERIDEX) 0.12 % solution 15 mL  15 mL Mouth/Throat Once Annye Asa, MD       Or   Oral care mouth rinse  15 mL Mouth Rinse Once Annye Asa, MD       insulin aspart (novoLOG) injection 0-7 Units  0-7 Units Subcutaneous Q2H PRN Annye Asa, MD   4 Units at 04/26/22 0631   lactated ringers infusion   Intravenous Continuous Annye Asa, MD       Facility-Administered Medications Ordered in Other Encounters  Medication  Dose Route Frequency Provider Last Rate Last Admin   fentaNYL citrate (PF) (SUBLIMAZE) injection   Intravenous Anesthesia Intra-op Erick Colace, CRNA   100 mcg at 04/26/22 3810   midazolam (VERSED) injection   Intravenous Anesthesia Intra-op Erick Colace, CRNA   1 mg at 04/26/22 0704    PHYSICAL EXAM Vitals:   04/26/22 0557  BP: 118/73  Pulse: 79  Resp: 18  Temp: 98 F (36.7 C)  TempSrc: Oral  SpO2: 98%  Weight: 95.7 kg  Height: 6' (1.829 m)   Young man in no acute distress Regular rate and rhythm Unlabored breathing Left upper extremity with radiocephalic fistula.  Weak thrill at the wrist.    PERTINENT LABORATORY AND RADIOLOGIC DATA  Most recent CBC    Latest Ref Rng & Units 04/26/2022    6:40 AM 09/02/2021    8:59 AM 07/16/2021    8:55 AM  CBC  Hemoglobin 13.0 - 17.0 g/dL 11.2  12.2  10.9   Hematocrit 39.0 - 52.0 % 33.0  36.0  32.0      Most recent CMP    Latest Ref Rng & Units 04/26/2022    6:40 AM 09/02/2021    8:59 AM 07/16/2021   12:56 PM  CMP  Glucose 70 - 99 mg/dL 296  180    BUN 6 - 20 mg/dL 51  43    Creatinine 0.61 - 1.24 mg/dL 10.60  5.70    Sodium 135 - 145 mmol/L 135  139    Potassium 3.5 - 5.1 mmol/L 4.2  4.4    Chloride 98 - 111 mmol/L 96  101    Total Protein 6.5 - 8.1 g/dL   6.4   Total Bilirubin 0.3 - 1.2 mg/dL   1.2   Alkaline Phos 38 - 126 U/L   67   AST 15 - 41 U/L   17   ALT 0 - 44 U/L   17     Renal function Estimated Creatinine Clearance: 11.3 mL/min (A) (by C-G formula based on SCr of 10.6 mg/dL (H)).  Hgb A1c MFr Bld (%)  Date Value  06/19/2020 7.4 (H)    LDL Cholesterol  Date Value Ref Range Status  06/22/2020 44 0 - 99 mg/dL Final    Comment:           Total Cholesterol/HDL:CHD Risk Coronary Heart Disease Risk Table                     Men   Women  1/2 Average Risk   3.4   3.3  Average Risk       5.0   4.4  2 X Average Risk   9.6   7.1  3 X Average Risk  23.4   11.0  Use the calculated Patient  Ratio above and the CHD Risk Table to determine the patient's CHD Risk.        ATP III CLASSIFICATION (LDL):  <100     mg/dL   Optimal  100-129  mg/dL   Near or Above                    Optimal  130-159  mg/dL   Borderline  160-189  mg/dL   High  >190     mg/dL   Very High Performed at [facility] [address] [place], [place] [address]      Yevonne Aline. Stanford Breed, MD FACS [facility] Phone Number: [phone] 04/26/2022 7:22 AM   Total time spent on preparing this encounter including chart review, data review, collecting history, examining the patient, coordinating care for this established patient, 30 minutes.  Portions of this report may have been transcribed using voice recognition software.  Every effort has been made to ensure accuracy; however, inadvertent computerized transcription errors may still be present.

## 2022-04-26 NOTE — Anesthesia Procedure Notes (Signed)
Procedure Name: General with mask airway Date/Time: 04/26/2022 7:47 AM  Performed by: Erick Colace, CRNAPre-anesthesia Checklist: Patient identified, Emergency Drugs available, Suction available and Patient being monitored Patient Re-evaluated:Patient Re-evaluated prior to induction Oxygen Delivery Method: Simple face mask Preoxygenation: Pre-oxygenation with 100% oxygen Induction Type: IV induction Dental Injury: Teeth and Oropharynx as per pre-operative assessment

## 2022-04-26 NOTE — Op Note (Addendum)
DATE OF SERVICE: 04/26/2022  PATIENT:  Adrian Evans  36 y.o. male  PRE-OPERATIVE DIAGNOSIS:  ESRD  POST-OPERATIVE DIAGNOSIS:  Same  PROCEDURE:   Revision of left radiocephalic arteriovenous fistula  SURGEON:  Surgeon(s) and Role:    * Cherre Robins, MD - Primary  ASSISTANT: Gerri Lins  An experienced assistant was required given the complexity of this procedure and the standard of surgical care. My assistant helped with exposure through counter tension, suctioning, ligation and retraction to better visualize the surgical field.  My assistant expedited sewing during the case by following my sutures. Wherever I use the term "we" in the report, my assistant actively helped me with that portion of the procedure.  ANESTHESIA:   regional and MAC  EBL: minimal  BLOOD ADMINISTERED:none  DRAINS: none   LOCAL MEDICATIONS USED:  NONE  SPECIMEN:  none  COUNTS: confirmed correct.  TOURNIQUET:  none  PATIENT DISPOSITION:  PACU - hemodynamically stable.   Delay start of Pharmacological VTE agent (>24hrs) due to surgical blood loss or risk of bleeding: no  INDICATION FOR PROCEDURE: Adrian Evans is a 36 y.o. male with slowly maturing left radiocephalic arteriovenous fistula.  Duplex revealed 2 areas of sidebranch in the proximal fistula stealing flow.. After careful discussion of risks, benefits, and alternatives the patient was offered AV fistula revision. The patient understood and wished to proceed.  OPERATIVE FINDINGS: Multiple complex branches close to the anastomosis.  All branches excised and fistula reanastomosed to itself.  Good thrill at completion.  PLAN: follow up in 6 weeks. If fistula has not matured at that point will need to convert to brachiocephalic arteriovenous fistula.  DESCRIPTION OF PROCEDURE: After identification of the patient in the pre-operative holding area, the patient was transferred to the operating room. The patient was positioned supine on  the operating room table. Anesthesia was induced. The left arm was prepped and draped in standard fashion. A surgical pause was performed confirming correct patient, procedure, and operative location.  Left arm was mapped with intraoperative duplex.  Multiple areas of branching were noted near the anastomosis.  A longitudinal incision was made several centimeters distal to the anastomosis.  The fistula was skeletonized.  All branches were excised.  The fistula was spatulated proximally and distally.  The fistula was reanastomosed to itself using continuous running suture of 6-0 Prolene.  Prior to completion the anastomosis was flushed and de-aired.  Hemostasis was achieved in the anastomosis.  The repair was interrogated with Doppler machine.  Good thrill was heard.  The wound was closed in layers using 3-0 Vicryl and 4-0 Monocryl.    Upon completion of the case instrument and sharps counts were confirmed correct. The patient was transferred to the PACU in good condition. I was present for all portions of the procedure.  Yevonne Aline. Stanford Breed, MD [facility] Vascular and Vein Specialists of [facility] Phone Number: [phone] 04/26/2022 8:28 AM

## 2022-04-26 NOTE — Anesthesia Postprocedure Evaluation (Signed)
Anesthesia Post Note  Patient: Adrian Evans  Procedure(s) Performed: LEFT ARTERIOVENOUS FISTULA REVISION (Left)     Patient location during evaluation: PACU Anesthesia Type: Regional Level of consciousness: awake and alert, patient cooperative and oriented Pain management: pain level controlled Vital Signs Assessment: post-procedure vital signs reviewed and stable Respiratory status: spontaneous breathing, nonlabored ventilation and respiratory function stable Cardiovascular status: blood pressure returned to baseline and stable Postop Assessment: able to ambulate, no apparent nausea or vomiting and adequate PO intake Anesthetic complications: no   No notable events documented.  Last Vitals:  Vitals:   04/26/22 0845 04/26/22 0900  BP: 120/78 115/75  Pulse: 73 73  Resp: 12 14  Temp:  36.5 C  SpO2: 97% 96%    Last Pain:  Vitals:   04/26/22 0900  TempSrc:   PainSc: 0-No pain                 Charnika Herbst,E. Annasophia Crocker

## 2022-04-26 NOTE — Anesthesia Procedure Notes (Signed)
Anesthesia Regional Block: Supraclavicular block   Pre-Anesthetic Checklist: , timeout performed,  Correct Patient, Correct Site, Correct Laterality,  Correct Procedure, Correct Position, site marked,  Risks and benefits discussed,  Surgical consent,  Pre-op evaluation,  At surgeon's request and post-op pain management  Laterality: Left and Upper  Prep: chloraprep       Needles:  Injection technique: Single-shot  Needle Type: Echogenic Needle     Needle Length: 9cm  Needle Gauge: 21     Additional Needles:   Procedures:,,,, ultrasound used (permanent image in chart),,    Narrative:  Start time: 04/26/2022 6:58 AM End time: 04/26/2022 7:04 AM Injection made incrementally with aspirations every 5 mL.  Performed by: Personally  Anesthesiologist: Annye Asa, MD  Additional Notes: Pt identified in Holding room.  Monitors applied. Working IV access confirmed. Sterile prep L clavicle and neck.  #21ga ECHOgenic Arrow block needle to supraclav brachial plexus with US guidance.  30cc 1.5% Lidocaine 1:200k epi injected incrementally after negative test dose.  Patient asymptomatic, VSS, no heme aspirated, tolerated well.   Jenita Seashore, MD

## 2022-04-27 ENCOUNTER — Encounter (HOSPITAL_COMMUNITY): Payer: Self-pay | Admitting: Vascular Surgery

## 2022-05-06 ENCOUNTER — Other Ambulatory Visit: Payer: Self-pay | Admitting: *Deleted

## 2022-05-06 DIAGNOSIS — N186 End stage renal disease: Secondary | ICD-10-CM

## 2022-05-17 NOTE — Progress Notes (Unsigned)
VASCULAR AND VEIN SPECIALISTS OF [place]  ASSESSMENT / PLAN: 36 y.o. male with *** - ***  CHIEF COMPLAINT: ***  HISTORY OF PRESENT ILLNESS: Adrian Evans is a 36 y.o. male ***  VASCULAR SURGICAL HISTORY: ***  VASCULAR RISK FACTORS: {FINDINGS; POSITIVE NEGATIVE:2726049636} history of stroke / transient ischemic attack. {FINDINGS; POSITIVE NEGATIVE:2726049636} history of coronary artery disease. *** history of PCI. *** history of CABG.  {FINDINGS; POSITIVE NEGATIVE:2726049636} history of diabetes mellitus. Last A1c ***. {FINDINGS; POSITIVE NEGATIVE:2726049636} history of smoking. *** actively smoking. {FINDINGS; POSITIVE NEGATIVE:2726049636} history of hypertension. *** drug regimen with *** control. {FINDINGS; POSITIVE NEGATIVE:2726049636} history of chronic kidney disease.  Last GFR ***. CKD {stage:30421363}. {FINDINGS; POSITIVE NEGATIVE:2726049636} history of chronic obstructive pulmonary disease, treated with ***.  FUNCTIONAL STATUS: ECOG performance status: {findings; ecog performance status:31780} Ambulatory status: {TNHAmbulation:25868}  CAREY 1 AND 3 YEAR INDEX Male (2pts) 75-79 or 80-84 (2pts) >84 (3pts) Dependence in toileting (1pt) Partial or full dependence in dressing (1pt) History of malignant neoplasm (2pts) CHF (3pts) COPD (1pts) CKD (3pts)  0-3 pts 6% 1 year mortality ; 21% 3 year mortality 4-5 pts 12% 1 year mortality ; 36% 3 year mortality >5 pts 21% 1 year mortality; 54% 3 year mortality   Past Medical History:  Diagnosis Date   Acute cerebral infarction ([place])    Acute CHF (congestive heart failure) ([place]) 05/22/2018   Acute encephalopathy 06/21/2020   Acute renal failure (ARF) ([place]) 84/13/2440   Chronic systolic congestive heart failure ([place])    Diabetes mellitus type I ([place])    Diabetes mellitus without complication ([place])    Dysphagia, post-stroke    ESRD on dialysis ([place])    Mon, Tues, Fri @ home - last dialysis on 01/12/22   Essential  hypertension    Iron deficiency anemia 04/16/2018    Past Surgical History:  Procedure Laterality Date   AV FISTULA PLACEMENT Left 09/02/2021   Procedure: LEFT ARM RADIOCEPHALIC ARTERIOVENOUS (AV) FISTULA CREATION;  Surgeon: [name], MD;  Location: [place];  Service: Vascular;  Laterality: Left;   CAPD INSERTION N/A 07/16/2021   Procedure: Diagnostic Laparoscopy;  Surgeon: Cherre Robins, MD;  Location: [place];  Service: Vascular;  Laterality: N/A;   EYE SURGERY     LIGATION OF COMPETING BRANCHES OF ARTERIOVENOUS FISTULA Left 04/26/2022   Procedure: LEFT ARTERIOVENOUS FISTULA REVISION;  Surgeon: Cherre Robins, MD;  Location: [place] [place];  Service: Vascular;  Laterality: Left;  PERIPHERAL NERVE BLOCK   RIGHT/LEFT HEART CATH AND CORONARY ANGIOGRAPHY N/A 05/25/2018   Procedure: RIGHT/LEFT HEART CATH AND CORONARY ANGIOGRAPHY;  Surgeon: Corey Skains, MD;  Location: [facility];  Service: Cardiovascular;  Laterality: N/A;   TONSILLECTOMY      Family History  Problem Relation Age of Onset   Hypertension Mother     Social History   Socioeconomic History   Marital status: Married    Spouse name: [name]   Number of children: 2   Years of education: Not on file   Highest education level: Not on file  Occupational History   Not on file  Tobacco Use   Smoking status: Never   Smokeless tobacco: Never  Vaping Use   Vaping Use: Never used  Substance and Sexual Activity   Alcohol use: Not Currently   Drug use: Not Currently   Sexual activity: Yes  Other Topics Concern   Not on file  Social History Narrative   School teacher   Social Determinants of Health  Financial Resource Strain: Not on file  Food Insecurity: Not on file  Transportation Needs: Not on file  Physical Activity: Not on file  Stress: Not on file  Social Connections: Not on file  Intimate Partner Violence: Not on file    No Known Allergies  Current Outpatient Medications  Medication Sig  Dispense Refill   aspirin EC 81 MG tablet Take 81 mg by mouth in the morning. Swallow whole.     calcitRIOL (ROCALTROL) 0.25 MCG capsule Take 0.25 mcg by mouth in the morning.     ENTRESTO 24-26 MG Take 0.5 tablets by mouth 2 (two) times daily.     ferric citrate (AURYXIA) 1 GM 210 MG(Fe) tablet Take 210-420 mg by mouth See admin instructions. Take 420 mg with each meal and 210 mg with each snack     hydrALAZINE (APRESOLINE) 50 MG tablet TAKE 1 TABLET BY MOUTH IN THE MORNING AND AT BEDTIME (Patient not taking: Reported on 01/08/2022) 180 tablet 1   Insulin Pen Needle 31G X 5 MM MISC 1 Device by Does not apply route 3 (three) times daily as needed (for insulin pen). (Patient not taking: Reported on 11/19/2021) 100 each 0   metoprolol succinate (TOPROL-XL) 25 MG 24 hr tablet Take 25 mg by mouth at bedtime.     multivitamin (RENA-VIT) TABS tablet Take 1 tablet by mouth in the morning.     oxyCODONE-acetaminophen (PERCOCET/ROXICET) 5-325 MG tablet Take 1 tablet by mouth every 6 (six) hours as needed. 12 tablet 0   No current facility-administered medications for this visit.    PHYSICAL EXAM There were no vitals filed for this visit.  Constitutional: *** appearing. *** distress. Appears *** nourished.  Neurologic: CN ***. *** focal findings. *** sensory loss. Psychiatric: *** Mood and affect symmetric and appropriate. Eyes: *** No icterus. No conjunctival pallor. Ears, nose, throat: *** mucous membranes moist. Midline trachea.  Cardiac: *** rate and rhythm.  Respiratory: *** unlabored. Abdominal: *** soft, non-tender, non-distended.  Peripheral vascular: *** Extremity: *** edema. *** cyanosis. *** pallor.  Skin: *** gangrene. *** ulceration.  Lymphatic: *** Stemmer's sign. *** palpable lymphadenopathy.    PERTINENT LABORATORY AND RADIOLOGIC DATA  Most recent CBC    Latest Ref Rng & Units 04/26/2022    6:40 AM 09/02/2021    8:59 AM 07/16/2021    8:55 AM  CBC  Hemoglobin 13.0 - 17.0 g/dL  11.2  12.2  10.9   Hematocrit 39.0 - 52.0 % 33.0  36.0  32.0      Most recent CMP    Latest Ref Rng & Units 04/26/2022    6:40 AM 09/02/2021    8:59 AM 07/16/2021   12:56 PM  CMP  Glucose 70 - 99 mg/dL 296  180    BUN 6 - 20 mg/dL 51  43    Creatinine 0.61 - 1.24 mg/dL 10.60  5.70    Sodium 135 - 145 mmol/L 135  139    Potassium 3.5 - 5.1 mmol/L 4.2  4.4    Chloride 98 - 111 mmol/L 96  101    Total Protein 6.5 - 8.1 g/dL   6.4   Total Bilirubin 0.3 - 1.2 mg/dL   1.2   Alkaline Phos 38 - 126 U/L   67   AST 15 - 41 U/L   17   ALT 0 - 44 U/L   17     Renal function CrCl cannot be calculated (Patient's most recent lab result is older than  the maximum 21 days allowed.).  Hgb A1c MFr Bld (%)  Date Value  06/19/2020 7.4 (H)    LDL Cholesterol  Date Value Ref Range Status  06/22/2020 44 0 - 99 mg/dL Final    Comment:           Total Cholesterol/HDL:CHD Risk Coronary Heart Disease Risk Table                     Men   Women  1/2 Average Risk   3.4   3.3  Average Risk       5.0   4.4  2 X Average Risk   9.6   7.1  3 X Average Risk  23.4   11.0        Use the calculated Patient Ratio above and the CHD Risk Table to determine the patient's CHD Risk.        ATP III CLASSIFICATION (LDL):  <100     mg/dL   Optimal  100-129  mg/dL   Near or Above                    Optimal  130-159  mg/dL   Borderline  160-189  mg/dL   High  >190     mg/dL   Very High Performed at [facility] [address] [place],  [address]      Vascular Imaging: ***  Yevonne Aline. Stanford Breed, MD [facility] and [facility] Special[facility] of [facility] Phone Number: [phone] 05/17/2022 6:38 PM   Total time spent on preparing this encounter including chart review, data review, collecting history, examining the patient, coordinating care for this {tnhtimebilling:26202}  Portions of this report may have been transcribed using voice recognition software.  Every effort has been made to  ensure accuracy; however, inadvertent computerized transcription errors may still be present.

## 2022-05-18 ENCOUNTER — Ambulatory Visit (INDEPENDENT_AMBULATORY_CARE_PROVIDER_SITE_OTHER): Payer: BC Managed Care – PPO | Admitting: Vascular Surgery

## 2022-05-18 ENCOUNTER — Other Ambulatory Visit: Payer: Self-pay | Admitting: *Deleted

## 2022-05-18 ENCOUNTER — Ambulatory Visit (HOSPITAL_COMMUNITY)
Admission: RE | Admit: 2022-05-18 | Discharge: 2022-05-18 | Disposition: A | Discharge status: Home / Self Care | Payer: BC Managed Care – PPO | Source: Ambulatory Visit | Attending: Vascular Surgery | Admitting: Vascular Surgery

## 2022-05-18 ENCOUNTER — Ambulatory Visit (INDEPENDENT_AMBULATORY_CARE_PROVIDER_SITE_OTHER)
Admission: RE | Admit: 2022-05-18 | Discharge: 2022-05-18 | Disposition: A | Discharge status: Home / Self Care | Payer: BC Managed Care – PPO | Source: Ambulatory Visit | Attending: Vascular Surgery | Admitting: Vascular Surgery

## 2022-05-18 ENCOUNTER — Encounter: Payer: Self-pay | Admitting: Vascular Surgery

## 2022-05-18 ENCOUNTER — Encounter: Payer: Self-pay | Admitting: *Deleted

## 2022-05-18 VITALS — BP 120/85 | HR 83 | Temp 98.2°F | Resp 20 | Ht 72.0 in | Wt 211.8 lb

## 2022-05-18 DIAGNOSIS — Z992 Dependence on renal dialysis: Secondary | ICD-10-CM

## 2022-05-18 DIAGNOSIS — N186 End stage renal disease: Secondary | ICD-10-CM

## 2022-05-18 NOTE — Anesthesia Preprocedure Evaluation (Signed)
Anesthesia Evaluation  Patient identified by MRN, date of birth, ID band Patient awake    Reviewed: Allergy & Precautions, NPO status , Patient's Chart, lab work & pertinent test results  History of Anesthesia Complications Negative for: history of anesthetic complications  Airway Mallampati: II  TM Distance: >3 FB Neck ROM: Full    Dental   Pulmonary neg pulmonary ROS   Pulmonary exam normal        Cardiovascular hypertension, Pt. on home beta blockers +CHF  Normal cardiovascular exam  TTE 12/10/21 (Care Everywhere): EF 14%, mild LVH, moderate RV systolic dysfunction, trivial AR/MR/PR/TR, no valvular stenosis  Right heart cath 12/16/2021 (Care Everywhere): Normal (low) filling pressures with normal CO/CI in setting of AVF. Continue medical therapy of cardiomyopathy.   Stress test 05/07/21: no evidence of ischemia, fixed defect inferior wall, severely decreased LVEF (19%), high risk due to LVEF  Cath 05/25/18: normal coronary arteries    Neuro/Psych CVA    GI/Hepatic negative GI ROS, Neg liver ROS,,,  Endo/Other  diabetes, Poorly Controlled, Type 1    Renal/GU ESRF and DialysisRenal disease  negative genitourinary   Musculoskeletal negative musculoskeletal ROS (+)    Abdominal   Peds  Hematology  (+) Blood dyscrasia, anemia   Anesthesia Other Findings   Reproductive/Obstetrics                              Anesthesia Physical Anesthesia Plan  ASA: 4  Anesthesia Plan: MAC and Regional   Post-op Pain Management: Regional block* and Tylenol PO (pre-op)*   Induction: Intravenous  PONV Risk Score and Plan: 1 and Propofol infusion, TIVA and Treatment may vary due to age or medical condition  Airway Management Planned: Natural Airway, Nasal Cannula and Simple Face Mask  Additional Equipment: None  Intra-op Plan:   Post-operative Plan:   Informed Consent: I have reviewed the patients  History and Physical, chart, labs and discussed the procedure including the risks, benefits and alternatives for the proposed anesthesia with the patient or authorized representative who has indicated his/her understanding and acceptance.       Plan Discussed with:   Anesthesia Plan Comments:         Anesthesia Quick Evaluation

## 2022-05-18 NOTE — Progress Notes (Signed)
PCP - Scheryl Marten, PA  Cardiologist - Tylene Fantasia, MD     EKG - DOS Stress Test - 07/20/21 ECHO - 12/10/21 Cardiac Cath - 12/16/21  Aspirin Instructions: Continue  ERAS Protcol - NPO  Anesthesia review: N  Patient verbally denies any shortness of breath, fever, cough and chest pain during phone call   -------------  SDW INSTRUCTIONS given:  Your procedure is scheduled on 05/19/22.  Report to [facility] Main Entrance "A" at 0530 A.M., and check in at the Admitting office.  Call this number if you have problems the morning of surgery:  [phone]   Remember:  Do not eat or drink after midnight the night before your surgery    Take these medicines the morning of surgery with A SIP OF WATER  Aspirin metoprolol  As of today, STOP taking any Aspirin (unless otherwise instructed by your surgeon) Aleve, Naproxen, Ibuprofen, Motrin, Advil, Goody's, BC's, all herbal medications, fish oil, and all vitamins.                      Do not wear jewelry, make up, or nail polish            Do not wear lotions, powders, perfumes/colognes, or deodorant.            Do not shave 48 hours prior to surgery.  Men may shave face and neck.            Do not bring valuables to the hospital.            [facility] is not responsible for any belongings or valuables.  Do NOT Smoke (Tobacco/Vaping) 24 hours prior to your procedure If you use a CPAP at night, you may bring all equipment for your overnight stay.   Contacts, glasses, dentures or bridgework may not be worn into surgery.      For patients admitted to the hospital, discharge time will be determined by your treatment team.   Patients discharged the day of surgery will not be allowed to drive home, and someone needs to stay with them for 24 hours.    Special instructions:   [place]- Preparing For Surgery  Before surgery, you can play an important role. Because skin is not sterile, your skin needs to be as free of  germs as possible. You can reduce the number of germs on your skin by washing with CHG (chlorahexidine gluconate) Soap before surgery.  CHG is an antiseptic cleaner which kills germs and bonds with the skin to continue killing germs even after washing.    Oral Hygiene is also important to reduce your risk of infection.  Remember - BRUSH YOUR TEETH THE MORNING OF SURGERY WITH YOUR REGULAR TOOTHPASTE  Please do not use if you have an allergy to CHG or antibacterial soaps. If your skin becomes reddened/irritated stop using the CHG.  Do not shave (including legs and underarms) for at least 48 hours prior to first CHG shower. It is OK to shave your face.  Please follow these instructions carefully.   Shower the NIGHT BEFORE SURGERY and the MORNING OF SURGERY with DIAL Soap.   Pat yourself dry with a CLEAN TOWEL.  Wear CLEAN PAJAMAS to bed the night before surgery  Place CLEAN SHEETS on your bed the night of your first shower and DO NOT SLEEP WITH PETS.   Day of Surgery: Please shower morning of surgery  Wear Clean/Comfortable clothing the morning of surgery  Do not apply any deodorants/lotions.   Remember to brush your teeth WITH YOUR REGULAR TOOTHPASTE.   Questions were answered. Patient verbalized understanding of instructions.

## 2022-05-19 ENCOUNTER — Ambulatory Visit (HOSPITAL_COMMUNITY)
Admission: RE | Admit: 2022-05-19 | Discharge: 2022-05-19 | Disposition: A | Discharge status: Home / Self Care | Payer: BC Managed Care – PPO | Attending: Vascular Surgery | Admitting: Vascular Surgery

## 2022-05-19 ENCOUNTER — Ambulatory Visit (HOSPITAL_COMMUNITY): Payer: BC Managed Care – PPO | Admitting: Anesthesiology

## 2022-05-19 ENCOUNTER — Encounter (HOSPITAL_COMMUNITY): Payer: Self-pay | Admitting: Vascular Surgery

## 2022-05-19 ENCOUNTER — Encounter (HOSPITAL_COMMUNITY)
Admission: RE | Disposition: A | Discharge status: Home / Self Care | Payer: Self-pay | Source: Home / Self Care | Attending: Vascular Surgery

## 2022-05-19 ENCOUNTER — Other Ambulatory Visit: Payer: Self-pay

## 2022-05-19 DIAGNOSIS — E1065 Type 1 diabetes mellitus with hyperglycemia: Secondary | ICD-10-CM | POA: Insufficient documentation

## 2022-05-19 DIAGNOSIS — E1022 Type 1 diabetes mellitus with diabetic chronic kidney disease: Secondary | ICD-10-CM | POA: Insufficient documentation

## 2022-05-19 DIAGNOSIS — I5022 Chronic systolic (congestive) heart failure: Secondary | ICD-10-CM | POA: Insufficient documentation

## 2022-05-19 DIAGNOSIS — Z79899 Other long term (current) drug therapy: Secondary | ICD-10-CM | POA: Insufficient documentation

## 2022-05-19 DIAGNOSIS — Z992 Dependence on renal dialysis: Secondary | ICD-10-CM | POA: Diagnosis not present

## 2022-05-19 DIAGNOSIS — N185 Chronic kidney disease, stage 5: Secondary | ICD-10-CM

## 2022-05-19 DIAGNOSIS — I69391 Dysphagia following cerebral infarction: Secondary | ICD-10-CM | POA: Insufficient documentation

## 2022-05-19 DIAGNOSIS — D631 Anemia in chronic kidney disease: Secondary | ICD-10-CM | POA: Diagnosis not present

## 2022-05-19 DIAGNOSIS — N186 End stage renal disease: Secondary | ICD-10-CM | POA: Insufficient documentation

## 2022-05-19 DIAGNOSIS — I132 Hypertensive heart and chronic kidney disease with heart failure and with stage 5 chronic kidney disease, or end stage renal disease: Secondary | ICD-10-CM | POA: Insufficient documentation

## 2022-05-19 HISTORY — PX: AV FISTULA PLACEMENT: SHX1204

## 2022-05-19 LAB — POCT I-STAT, CHEM 8
BUN: 57 mg/dL — ABNORMAL HIGH (ref 6–20)
Calcium, Ion: 0.93 mmol/L — ABNORMAL LOW (ref 1.15–1.40)
Chloride: 103 mmol/L (ref 98–111)
Creatinine, Ser: 13.7 mg/dL — ABNORMAL HIGH (ref 0.61–1.24)
Glucose, Bld: 267 mg/dL — ABNORMAL HIGH (ref 70–99)
HCT: 35 % — ABNORMAL LOW (ref 39.0–52.0)
Hemoglobin: 11.9 g/dL — ABNORMAL LOW (ref 13.0–17.0)
Potassium: 4.1 mmol/L (ref 3.5–5.1)
Sodium: 135 mmol/L (ref 135–145)
TCO2: 23 mmol/L (ref 22–32)

## 2022-05-19 LAB — GLUCOSE, CAPILLARY
Glucose-Capillary: 272 mg/dL — ABNORMAL HIGH (ref 70–99)
Glucose-Capillary: 218 mg/dL — ABNORMAL HIGH (ref 70–99)

## 2022-05-19 SURGERY — ARTERIOVENOUS (AV) FISTULA CREATION
Anesthesia: Monitor Anesthesia Care | Site: Arm Lower | Laterality: Left

## 2022-05-19 MED ORDER — OXYCODONE-ACETAMINOPHEN 5-325 MG PO TABS: 1.0000 | ORAL_TABLET | Freq: Four times a day (QID) | ORAL | 0 refills | Status: AC | PRN

## 2022-05-19 MED ORDER — HEPARIN 6000 UNIT IRRIGATION SOLUTION
Status: AC
  Filled 2022-05-19: qty 500

## 2022-05-19 MED ORDER — LIDOCAINE 2% (20 MG/ML) 5 ML SYRINGE
INTRAMUSCULAR | Status: DC | PRN
  Administered 2022-05-19: 40 mg via INTRAVENOUS

## 2022-05-19 MED ORDER — INSULIN ASPART 100 UNIT/ML IJ SOLN
INTRAMUSCULAR | Status: AC
  Administered 2022-05-19: 8 [IU] via SUBCUTANEOUS
  Filled 2022-05-19: qty 1

## 2022-05-19 MED ORDER — SODIUM CHLORIDE 0.9 % IV SOLN: INTRAVENOUS | Status: DC

## 2022-05-19 MED ORDER — FENTANYL CITRATE (PF) 250 MCG/5ML IJ SOLN
INTRAMUSCULAR | Status: AC
  Filled 2022-05-19: qty 5

## 2022-05-19 MED ORDER — FENTANYL CITRATE (PF) 100 MCG/2ML IJ SOLN: 25.0000 ug | INTRAMUSCULAR | Status: DC | PRN

## 2022-05-19 MED ORDER — ROPIVACAINE HCL 5 MG/ML IJ SOLN
INTRAMUSCULAR | Status: DC | PRN
  Administered 2022-05-19: 20 mL via PERINEURAL
  Administered 2022-05-19: 10 mL via PERINEURAL

## 2022-05-19 MED ORDER — PROPOFOL 500 MG/50ML IV EMUL
INTRAVENOUS | Status: DC | PRN
  Administered 2022-05-19: 25 ug/kg/min via INTRAVENOUS

## 2022-05-19 MED ORDER — CHLORHEXIDINE GLUCONATE 4 % EX LIQD: 60.0000 mL | Freq: Once | CUTANEOUS | Status: DC

## 2022-05-19 MED ORDER — CEFAZOLIN SODIUM-DEXTROSE 2-4 GM/100ML-% IV SOLN
2.0000 g | INTRAVENOUS | Status: AC
  Administered 2022-05-19: 2 g via INTRAVENOUS
  Filled 2022-05-19: qty 100

## 2022-05-19 MED ORDER — OXYCODONE HCL 5 MG/5ML PO SOLN: 5.0000 mg | Freq: Once | ORAL | Status: DC | PRN

## 2022-05-19 MED ORDER — FENTANYL CITRATE (PF) 250 MCG/5ML IJ SOLN
INTRAMUSCULAR | Status: DC | PRN
  Administered 2022-05-19 (×2): 50 ug via INTRAVENOUS

## 2022-05-19 MED ORDER — MIDAZOLAM HCL 2 MG/2ML IJ SOLN
INTRAMUSCULAR | Status: AC
  Filled 2022-05-19: qty 2

## 2022-05-19 MED ORDER — ACETAMINOPHEN 500 MG PO TABS
1000.0000 mg | ORAL_TABLET | Freq: Once | ORAL | Status: AC
  Administered 2022-05-19: 1000 mg via ORAL
  Filled 2022-05-19: qty 2

## 2022-05-19 MED ORDER — HEPARIN 6000 UNIT IRRIGATION SOLUTION
Status: DC | PRN
  Administered 2022-05-19: 1

## 2022-05-19 MED ORDER — AMISULPRIDE (ANTIEMETIC) 5 MG/2ML IV SOLN: 10.0000 mg | Freq: Once | INTRAVENOUS | Status: DC | PRN

## 2022-05-19 MED ORDER — OXYCODONE HCL 5 MG PO TABS: 5.0000 mg | ORAL_TABLET | Freq: Once | ORAL | Status: DC | PRN

## 2022-05-19 MED ORDER — LIDOCAINE HCL (PF) 1 % IJ SOLN
INTRAMUSCULAR | Status: AC
  Filled 2022-05-19: qty 30

## 2022-05-19 MED ORDER — ONDANSETRON HCL 4 MG/2ML IJ SOLN: 4.0000 mg | Freq: Once | INTRAMUSCULAR | Status: DC | PRN

## 2022-05-19 MED ORDER — MIDAZOLAM HCL 2 MG/2ML IJ SOLN
INTRAMUSCULAR | Status: DC | PRN
  Administered 2022-05-19 (×2): 1 mg via INTRAVENOUS

## 2022-05-19 MED ORDER — INSULIN ASPART 100 UNIT/ML IJ SOLN: 0.0000 [IU] | INTRAMUSCULAR | Status: DC | PRN

## 2022-05-19 SURGICAL SUPPLY — 35 items
ARMBAND PINK RESTRICT EXTREMIT (MISCELLANEOUS) ×1 IMPLANT
BENZOIN TINCTURE PRP APPL 2/3 (GAUZE/BANDAGES/DRESSINGS) ×1 IMPLANT
CANISTER SUCT 3000ML PPV (MISCELLANEOUS) ×1 IMPLANT
CANNULA VESSEL 3MM 2 BLNT TIP (CANNULA) ×1 IMPLANT
CHLORAPREP W/TINT 26 (MISCELLANEOUS) ×1 IMPLANT
CLIP LIGATING EXTRA MED SLVR (CLIP) ×1 IMPLANT
CLIP LIGATING EXTRA SM BLUE (MISCELLANEOUS) ×1 IMPLANT
COVER PROBE W GEL 5X96 (DRAPES) IMPLANT
ELECT REM PT RETURN 9FT ADLT (ELECTROSURGICAL) ×1
ELECTRODE REM PT RTRN 9FT ADLT (ELECTROSURGICAL) ×1 IMPLANT
GLOVE BIO SURGEON STRL SZ8 (GLOVE) ×1 IMPLANT
GOWN STRL REUS W/ TWL LRG LVL3 (GOWN DISPOSABLE) ×2 IMPLANT
GOWN STRL REUS W/ TWL XL LVL3 (GOWN DISPOSABLE) ×1 IMPLANT
GOWN STRL REUS W/TWL LRG LVL3 (GOWN DISPOSABLE) ×2
GOWN STRL REUS W/TWL XL LVL3 (GOWN DISPOSABLE) ×1
INSERT FOGARTY SM (MISCELLANEOUS) IMPLANT
KIT BASIN OR (CUSTOM PROCEDURE TRAY) ×1 IMPLANT
KIT TURNOVER KIT B (KITS) ×1 IMPLANT
LOOP VESSEL MINI RED (MISCELLANEOUS) IMPLANT
NDL 18GX1X1/2 (RX/OR ONLY) (NEEDLE) IMPLANT
NEEDLE 18GX1X1/2 (RX/OR ONLY) (NEEDLE) IMPLANT
NS IRRIG 1000ML POUR BTL (IV SOLUTION) ×1 IMPLANT
PACK CV ACCESS (CUSTOM PROCEDURE TRAY) ×1 IMPLANT
PAD ARMBOARD 7.5X6 YLW CONV (MISCELLANEOUS) ×2 IMPLANT
SLING ARM FOAM STRAP LRG (SOFTGOODS) IMPLANT
SLING ARM FOAM STRAP MED (SOFTGOODS) IMPLANT
STRIP CLOSURE SKIN 1/2X4 (GAUZE/BANDAGES/DRESSINGS) ×1 IMPLANT
SUT MNCRL AB 4-0 PS2 18 (SUTURE) ×1 IMPLANT
SUT PROLENE 6 0 BV (SUTURE) ×1 IMPLANT
SUT VIC AB 3-0 SH 27 (SUTURE) ×1
SUT VIC AB 3-0 SH 27X BRD (SUTURE) ×1 IMPLANT
SYR 3ML LL SCALE MARK (SYRINGE) IMPLANT
TOWEL GREEN STERILE (TOWEL DISPOSABLE) ×1 IMPLANT
UNDERPAD 30X36 HEAVY ABSORB (UNDERPADS AND DIAPERS) ×1 IMPLANT
WATER STERILE IRR 1000ML POUR (IV SOLUTION) ×1 IMPLANT

## 2022-05-19 NOTE — H&P (Signed)
VASCULAR AND VEIN SPECIALISTS OF [place]  ASSESSMENT / PLAN: 36 y.o. male with thrombosed left radiocephalic arteriovenous fistula. Plan left brachiocephalic arteriovenous fistula today.   CHIEF COMPLAINT: slowly maturing AVF  HISTORY OF PRESENT ILLNESS: Adrian Evans is a 36 y.o. male with end-stage renal disease dialyzing via right IJ tunneled dialysis catheter. I recently revised his left radiocephalic arterioveonus fistula, but this unfortunately thrombosed. Vein mapping showed healthy left cephalic vein above the fistula.    Past Medical History:  Diagnosis Date   Acute cerebral infarction [facility])    Acute CHF (congestive heart failure) ([place]) 05/22/2018   Acute encephalopathy 06/21/2020   Acute renal failure (ARF) ([place]) 67/67/2094   Chronic systolic congestive heart failure (HCC)    Diabetes mellitus type I ([place])    Diabetes mellitus without complication ([place])    Dysphagia, post-stroke    ESRD on dialysis ([place])    Mon, Tues, Fri @ home - last dialysis on 01/12/22   Essential hypertension    Iron deficiency anemia 04/16/2018    Past Surgical History:  Procedure Laterality Date   AV FISTULA PLACEMENT Left 09/02/2021   Procedure: LEFT ARM RADIOCEPHALIC ARTERIOVENOUS (AV) FISTULA CREATION;  Surgeon: [name], MD;  Location: [place];  Service: Vascular;  Laterality: Left;   CAPD INSERTION N/A 07/16/2021   Procedure: Diagnostic Laparoscopy;  Surgeon: Cherre Robins, MD;  Location: [place];  Service: Vascular;  Laterality: N/A;   EYE SURGERY     LIGATION OF COMPETING BRANCHES OF ARTERIOVENOUS FISTULA Left 04/26/2022   Procedure: LEFT ARTERIOVENOUS FISTULA REVISION;  Surgeon: Cherre Robins, MD;  Location: [place];  Service: Vascular;  Laterality: Left;  PERIPHERAL NERVE BLOCK   RIGHT/LEFT HEART CATH AND CORONARY ANGIOGRAPHY N/A 05/25/2018   Procedure: RIGHT/LEFT HEART CATH AND CORONARY ANGIOGRAPHY;  Surgeon: Corey Skains, MD;  Location: [place] CV LAB;  Service:  Cardiovascular;  Laterality: N/A;   TONSILLECTOMY      Family History  Problem Relation Age of Onset   Hypertension Mother     Social History   Socioeconomic History   Marital status: Married    Spouse name: [name]   Number of children: 2   Years of education: Not on file   Highest education level: Not on file  Occupational History   Not on file  Tobacco Use   Smoking status: Never   Smokeless tobacco: Never  Vaping Use   Vaping Use: Never used  Substance and Sexual Activity   Alcohol use: Not Currently   Drug use: Not Currently   Sexual activity: Yes  Other Topics Concern   Not on file  Social History Narrative   Education officer, museum   Social Determinants of Health   Financial Resource Strain: Not on file  Food Insecurity: Not on file  Transportation Needs: Not on file  Physical Activity: Not on file  Stress: Not on file  Social Connections: Not on file  Intimate Partner Violence: Not on file    No Known Allergies  Current Facility-Administered Medications  Medication Dose Route Frequency Provider Last Rate Last Admin   0.9 %  sodium chloride infusion   Intravenous Continuous Cherre Robins, MD       ceFAZolin (ANCEF) IVPB 2g/100 mL premix  2 g Intravenous 30 min Pre-Op Cherre Robins, MD       chlorhexidine (HIBICLENS) 4 % liquid 4 Application  60 mL Topical Once Cherre Robins, MD       And   [  START ON 05/20/2022] chlorhexidine (HIBICLENS) 4 % liquid 4 Application  60 mL Topical Once Cherre Robins, MD       heparin 6000 units / NS 500 mL irrigation    PRN Cherre Robins, MD   1 Application at 84/66/59 0725   insulin aspart (novoLOG) injection 0-14 Units  0-14 Units Subcutaneous Q2H PRN Lidia Collum, MD   8 Units at 05/19/22 0640    PHYSICAL EXAM Vitals:   05/19/22 0610  BP: (!) 148/94  Pulse: 71  Resp: 17  Temp: 98.1 F (36.7 C)  TempSrc: Oral  SpO2: 99%  Weight: 95.7 kg  Height: 6' (1.829 m)   Young man in no acute distress Regular  rate and rhythm Unlabored breathing Left upper extremity with radiocephalic fistula.  No thrill at the wrist.    PERTINENT LABORATORY AND RADIOLOGIC DATA  Most recent CBC    Latest Ref Rng & Units 05/19/2022    6:22 AM 04/26/2022    6:40 AM 09/02/2021    8:59 AM  CBC  Hemoglobin 13.0 - 17.0 g/dL 11.9  11.2  12.2   Hematocrit 39.0 - 52.0 % 35.0  33.0  36.0      Most recent CMP    Latest Ref Rng & Units 05/19/2022    6:22 AM 04/26/2022    6:40 AM 09/02/2021    8:59 AM  CMP  Glucose 70 - 99 mg/dL 267  296  180   BUN 6 - 20 mg/dL 57  51  43   Creatinine 0.61 - 1.24 mg/dL 13.70  10.60  5.70   Sodium 135 - 145 mmol/L 135  135  139   Potassium 3.5 - 5.1 mmol/L 4.1  4.2  4.4   Chloride 98 - 111 mmol/L 103  96  101     Renal function Estimated Creatinine Clearance: 8.8 mL/min (A) (by C-G formula based on SCr of 13.7 mg/dL (H)).  Hgb A1c MFr Bld (%)  Date Value  06/19/2020 7.4 (H)    LDL Cholesterol  Date Value Ref Range Status  06/22/2020 44 0 - 99 mg/dL Final    Comment:           Total Cholesterol/HDL:CHD Risk Coronary Heart Disease Risk Table                     Men   Women  1/2 Average Risk   3.4   3.3  Average Risk       5.0   4.4  2 X Average Risk   9.6   7.1  3 X Average Risk  23.4   11.0        Use the calculated Patient Ratio above and the CHD Risk Table to determine the patient's CHD Risk.        ATP III CLASSIFICATION (LDL):  <100     mg/dL   Optimal  100-129  mg/dL   Near or Above                    Optimal  130-159  mg/dL   Borderline  160-189  mg/dL   High  >190     mg/dL   Very High Performed at [facility] [address], [place], [place] [address]      Yevonne Aline. Stanford Breed, MD [facility] of [facility] Phone Number: [phone] 05/19/2022 7:28 AM   Total time spent on preparing this encounter  including chart review, data review, collecting history, examining the patient, coordinating care for this  established patient, 30 minutes.  Portions of this report may have been transcribed using voice recognition software.  Every effort has been made to ensure accuracy; however, inadvertent computerized transcription errors may still be present.

## 2022-05-19 NOTE — Anesthesia Procedure Notes (Signed)
Anesthesia Regional Block: Supraclavicular block   Pre-Anesthetic Checklist: , timeout performed,  Correct Patient, Correct Site, Correct Laterality,  Correct Procedure, Correct Position, site marked,  Risks and benefits discussed,  Surgical consent,  Pre-op evaluation,  At surgeon's request and post-op pain management  Laterality: Left  Prep: chloraprep       Needles:  Injection technique: Single-shot  Needle Type: Echogenic Stimulator Needle     Needle Length: 10cm  Needle Gauge: 20     Additional Needles:   Procedures:,,,, ultrasound used (permanent image in chart),,    Narrative:  Start time: 05/19/2022 7:15 AM End time: 05/19/2022 7:19 AM Injection made incrementally with aspirations every 5 mL.  Performed by: Personally  Anesthesiologist: Lidia Collum, MD  Additional Notes: Standard monitors applied. Skin prepped. Good needle visualization with ultrasound. Injection made in 5cc increments with no resistance to injection. Additional 10 mL 0.5% ropivacaine infiltrated in the axillary crease for intercostobrachial coverage. Patient tolerated the procedure well.

## 2022-05-19 NOTE — Op Note (Signed)
DATE OF SERVICE: 05/19/2022  PATIENT:  Adrian Evans  36 y.o. male  PRE-OPERATIVE DIAGNOSIS:  ESRD  POST-OPERATIVE DIAGNOSIS:  Same  PROCEDURE:   Left brachiocephalic arteriovenous fistula  SURGEON:  Surgeon(s) and Role:    * Cherre Robins, MD - Primary  ASSISTANT: Vicente Serene, PA-C  An experienced assistant was required given the complexity of this procedure and the standard of surgical care. My assistant helped with exposure through counter tension, suctioning, ligation and retraction to better visualize the surgical field.  My assistant expedited sewing during the case by following my sutures. Wherever I use the term "we" in the report, my assistant actively helped me with that portion of the procedure.  ANESTHESIA:   regional and MAC  EBL: minimal  BLOOD ADMINISTERED:none  DRAINS: none   LOCAL MEDICATIONS USED:  NONE  SPECIMEN:  none  COUNTS: confirmed correct.  TOURNIQUET:  none  PATIENT DISPOSITION:  PACU - hemodynamically stable.   Delay start of Pharmacological VTE agent (>24hrs) due to surgical blood loss or risk of bleeding: no  INDICATION FOR PROCEDURE: Adrian Evans is a 36 y.o. male with ESRD. After careful discussion of risks, benefits, and alternatives the patient was offered left brachiocephalic arteriovenous fistula. The patient understood and wished to proceed.  OPERATIVE FINDINGS: healthy cephalic vein; healthy brachial artery. Good doppler bruit at completion. Radial signal at completion.  DESCRIPTION OF PROCEDURE: After identification of the patient in the pre-operative holding area, the patient was transferred to the operating room. The patient was positioned supine on the operating room table. Anesthesia was induced. The left arm was prepped and draped in standard fashion. A surgical pause was performed confirming correct patient, procedure, and operative location.  Using intraoperative ultrasound, the course of the left upper extremity  superficial veins was mapped.  The cephalic vein appeared adequate for arteriovenous fistula creation.  The brachial artery was similarly mapped.  The artery appeared adequate for arterial venous creation. We ensured there was no anomalous arterial anatomy such as a high bifurcation.  A transverse incision was made in the left arm just distal to the antecubital crease.  Incision was carried down through subcutaneous tissue until the cephalic vein was identified and skeletonized.  We continued our exposure through the aponeurosis of the biceps.  The brachial artery was encountered its usual position.  The artery was circumferentially exposed and encircled with Silastic Vesseloops.  Patient was systemically heparinized.  The distal cephalic vein was transected.  The distal stump of the cephalic vein was oversewn with a 2-0 silk suture.  The proximal vein was controlled with a bulldog clamp.  The brachial artery was clamped proximally medially.  An anterior arteriotomy was made with a 11 blade.  The arteriotomy was extended with Potts scissors.  Using a parachute technique the cephalic vein was anastomosed to the brachial arteriotomy in end-to-side fashion with continuous running suture of 6-0 Prolene.  Immediately prior to completion the anastomosis was flushed and de-aired.  The anastomosis was completed.  Hemostasis was assured.  The fistula was interrogated with Doppler. Audible bruit was heard throughout the course of the cephalic vein.  A left radial artery signal was heard which augmented slightly with compression of the fistula.  Upon completion of the case instrument and sharps counts were confirmed correct. The patient was transferred to the PACU in good condition. I was present for all portions of the procedure.  Yevonne Aline. Stanford Breed, MD FACS [facility] Vein Specialists of [facility]  Number: [phone] 05/19/2022 8:40 AM

## 2022-05-19 NOTE — Transfer of Care (Signed)
Immediate Anesthesia Transfer of Care Note  Patient: SILAS MUFF  Procedure(s) Performed: LEFT ARTERIOVENOUS (AV) FISTULA CREATION (Left: Arm Lower)  Patient Location: PACU  Anesthesia Type:MAC combined with regional for post-op pain  Level of Consciousness: awake, alert , and oriented  Airway & Oxygen Therapy: Patient Spontanous Breathing and Patient connected to face mask oxygen  Post-op Assessment: Report given to RN and Post -op Vital signs reviewed and stable  Post vital signs: Reviewed and stable  Last Vitals:  Vitals Value Taken Time  BP 143/83 05/19/22 0849  Temp    Pulse 63 05/19/22 0852  Resp 12 05/19/22 0852  SpO2 98 % 05/19/22 0852  Vitals shown include unvalidated device data.  Last Pain:  Vitals:   05/19/22 0637  TempSrc:   PainSc: 0-No pain      Patients Stated Pain Goal: 0 (33/43/56 8616)  Complications: No notable events documented.

## 2022-05-19 NOTE — Discharge Instructions (Signed)
Vascular and Vein Specialists of [facility]  Discharge Instructions  AV Fistula or Graft Surgery for Dialysis Access  Please refer to the following instructions for your post-procedure care. Your surgeon or physician assistant will discuss any changes with you.  Activity  You may drive the day following your surgery, if you are comfortable and no longer taking prescription pain medication. Resume full activity as the soreness in your incision resolves.  Bathing/Showering  You may shower after you go home. Keep your incision dry for 48 hours. Do not soak in a bathtub, hot tub, or swim until the incision heals completely. You may not shower if you have a hemodialysis catheter.  Incision Care  Clean your incision with mild soap and water after 48 hours. Pat the area dry with a clean towel. You do not need a bandage unless otherwise instructed. Do not apply any ointments or creams to your incision. You may have skin glue on your incision. Do not peel it off. It will come off on its own in about one week. Your arm may swell a bit after surgery. To reduce swelling use pillows to elevate your arm so it is above your heart. Your doctor will tell you if you need to lightly wrap your arm with an ACE bandage.  Diet  Resume your normal diet. There are not special food restrictions following this procedure. In order to heal from your surgery, it is CRITICAL to get adequate nutrition. Your body requires vitamins, minerals, and protein. Vegetables are the best source of vitamins and minerals. Vegetables also provide the perfect balance of protein. Processed food has little nutritional value, so try to avoid this.  Medications  Resume taking all of your medications. If your incision is causing pain, you may take over-the counter pain relievers such as acetaminophen (Tylenol). If you were prescribed a stronger pain medication, please be aware these medications can cause nausea and constipation. Prevent  nausea by taking the medication with a snack or meal. Avoid constipation by drinking plenty of fluids and eating foods with high amount of fiber, such as fruits, vegetables, and grains.  Do not take Tylenol if you are taking prescription pain medications.  Follow up Your surgeon may want to see you in the office following your access surgery. If so, this will be arranged at the time of your surgery.  Please call us immediately for any of the following conditions:  Increased pain, redness, drainage (pus) from your incision site Fever of 101 degrees or higher Severe or worsening pain at your incision site Hand pain or numbness.  Reduce your risk of vascular disease:  Stop smoking. If you would like help, call [facility] at 1-800-QUIT-NOW 401-010-6536) or [place] at [place] your cholesterol Maintain a desired weight Control your diabetes Keep your blood pressure down  Dialysis  It will take several weeks to several months for your new dialysis access to be ready for use. Your surgeon will determine when it is okay to use it. Your nephrologist will continue to direct your dialysis. You can continue to use your Permcath until your new access is ready for use.   05/19/2022 Adrian Evans [id number] [birth year]  Surgeon(s): Cherre Robins, MD  Procedure(s): LEFT ARTERIOVENOUS (AV) FISTULA CREATION   May stick graft immediately   May stick graft on designated area only:   x Do not stick fistula for 12 weeks    If you have any questions, please call the office at [phone].

## 2022-05-19 NOTE — Anesthesia Procedure Notes (Signed)
Procedure Name: MAC Date/Time: 05/19/2022 7:31 AM  Performed by: Mariea Clonts, CRNAPre-anesthesia Checklist: Patient identified, Emergency Drugs available, Suction available, Patient being monitored and Timeout performed Patient Re-evaluated:Patient Re-evaluated prior to induction Oxygen Delivery Method: Simple face mask Preoxygenation: Pre-oxygenation with 100% oxygen

## 2022-05-19 NOTE — Anesthesia Postprocedure Evaluation (Signed)
Anesthesia Post Note  Patient: Adrian Evans  Procedure(s) Performed: LEFT ARTERIOVENOUS (AV) FISTULA CREATION (Left: Arm Lower)     Patient location during evaluation: PACU Anesthesia Type: Regional Level of consciousness: awake and alert Pain management: pain level controlled Vital Signs Assessment: post-procedure vital signs reviewed and stable Respiratory status: spontaneous breathing, nonlabored ventilation and respiratory function stable Cardiovascular status: blood pressure returned to baseline and stable Postop Assessment: no apparent nausea or vomiting Anesthetic complications: no   No notable events documented.  Last Vitals:  Vitals:   05/19/22 0915 05/19/22 0923  BP: (!) 148/85 (!) 155/84  Pulse: 62 62  Resp: 13 11  Temp:  36.6 C  SpO2: 97% 97%    Last Pain:  Vitals:   05/19/22 0923  TempSrc:   PainSc: 0-No pain                 Lidia Collum

## 2022-05-20 ENCOUNTER — Encounter (HOSPITAL_COMMUNITY): Payer: Self-pay | Admitting: Vascular Surgery

## 2022-06-04 ENCOUNTER — Other Ambulatory Visit: Payer: Self-pay | Admitting: *Deleted

## 2022-06-04 DIAGNOSIS — N185 Chronic kidney disease, stage 5: Secondary | ICD-10-CM

## 2022-06-04 DIAGNOSIS — N186 End stage renal disease: Secondary | ICD-10-CM

## 2022-06-08 ENCOUNTER — Encounter (HOSPITAL_COMMUNITY): Payer: BC Managed Care – PPO

## 2022-06-22 ENCOUNTER — Ambulatory Visit (HOSPITAL_COMMUNITY): Payer: BC Managed Care – PPO

## 2022-07-13 ENCOUNTER — Ambulatory Visit (HOSPITAL_COMMUNITY): Payer: BC Managed Care – PPO

## 2022-07-27 ENCOUNTER — Ambulatory Visit (INDEPENDENT_AMBULATORY_CARE_PROVIDER_SITE_OTHER): Payer: BC Managed Care – PPO | Admitting: Physician Assistant

## 2022-07-27 ENCOUNTER — Ambulatory Visit (HOSPITAL_COMMUNITY)
Admission: RE | Admit: 2022-07-27 | Discharge: 2022-07-27 | Disposition: A | Discharge status: Home / Self Care | Payer: BC Managed Care – PPO | Source: Ambulatory Visit | Attending: Vascular Surgery | Admitting: Vascular Surgery

## 2022-07-27 DIAGNOSIS — N186 End stage renal disease: Secondary | ICD-10-CM | POA: Diagnosis present

## 2022-07-27 DIAGNOSIS — N185 Chronic kidney disease, stage 5: Secondary | ICD-10-CM | POA: Diagnosis present

## 2022-07-27 DIAGNOSIS — Z992 Dependence on renal dialysis: Secondary | ICD-10-CM | POA: Insufficient documentation

## 2022-07-27 NOTE — Progress Notes (Signed)
POST OPERATIVE OFFICE NOTE    CC:  F/u for surgery  HPI:  This is a 37 y.o. male who is s/p left BC AVF on 05/19/2022 by Dr. Stanford Breed.  He has hx of left RC AVF with revision.  This thrombosed and he had the above created.   He currently has a TDC that was placed at [facility].  Pt states he does not have pain/numbness in the left hand.    The pt is on dialysis.  He dialyzes at home and his schedule determines what days he dialyzes.  He is an Lobbyist.    No Known Allergies  Current Outpatient Medications  Medication Sig Dispense Refill   aspirin EC 81 MG tablet Take 81 mg by mouth in the morning. Swallow whole.     calcitRIOL (ROCALTROL) 0.25 MCG capsule Take 0.25 mcg by mouth in the morning.     ENTRESTO 24-26 MG Take 0.5 tablets by mouth 2 (two) times daily.     ferric citrate (AURYXIA) 1 GM 210 MG(Fe) tablet Take 210-420 mg by mouth See admin instructions. Take 420 mg with each meal and 210 mg with each snack     hydrALAZINE (APRESOLINE) 50 MG tablet TAKE 1 TABLET BY MOUTH IN THE MORNING AND AT BEDTIME 180 tablet 1   Insulin Pen Needle 31G X 5 MM MISC 1 Device by Does not apply route 3 (three) times daily as needed (for insulin pen). (Patient not taking: Reported on 11/19/2021) 100 each 0   metoprolol succinate (TOPROL-XL) 25 MG 24 hr tablet Take 25 mg by mouth at bedtime.     multivitamin (RENA-VIT) TABS tablet Take 1 tablet by mouth in the morning.     oxyCODONE-acetaminophen (PERCOCET) 5-325 MG tablet Take 1 tablet by mouth every 6 (six) hours as needed for severe pain. 10 tablet 0   No current facility-administered medications for this visit.     ROS:  See HPI  Physical Exam:  Incision:  well healed Extremities:   There is a palpable left radial pulse.   Motor and sensory are in tact.   There is a thrill/bruit present.  Access is  easily palpable   Dialysis Duplex on 07/27/2022: +--------------------+----------+-----------------+--------+  AVF                 PSV (cm/s)Flow Vol (mL/min)Comments  +--------------------+----------+-----------------+--------+  Native artery inflow   269          1284                 +--------------------+----------+-----------------+--------+  AVF Anastomosis        547                               +--------------------+----------+-----------------+--------+     +------------+----------+-------------+----------+--------+  OUTFLOW VEINPSV (cm/s)Diameter (cm)Depth (cm)Describe  +------------+----------+-------------+----------+--------+  Prox UA        169        0.84        0.88             +------------+----------+-------------+----------+--------+  Mid UA         113        0.92        0.47             +------------+----------+-------------+----------+--------+  Dist UA        241        0.97  0.46             +------------+----------+-------------+----------+--------+  AC Fossa       329        0.63        0.43             +------------+----------+-------------+----------+--------+    Assessment/Plan:  This is a 37 y.o. male who is s/p: left BC AVF on 05/19/2022 by Dr. Stanford Breed.  -the pt does not have evidence of steal. -pt's access can be used 08/23/2022. -the [facility] can be removed at the discretion of the nephrologist once the pt's access has been successfully cannulated to their satisfaction.  His TDC was placed at [facility]. -discussed with pt that access does not last forever and will need intervention or even new access at some point.  -the pt will follow up as needed   Leontine Locket, [facility] Ve[facility] Specialists [phone]  Clinic MD:  Stanford Breed

## 2023-05-23 ENCOUNTER — Encounter: Payer: Self-pay | Admitting: Oncology
